# Patient Record
Sex: Female | Born: 1940 | Race: White | Hispanic: No | Smoking: Never smoker
Health system: Southern US, Community
[De-identification: ages and names within clinical notes are randomized; demographics above are authoritative.]

## PROBLEM LIST (undated history)

## (undated) DIAGNOSIS — M8580 Other specified disorders of bone density and structure, unspecified site: Secondary | ICD-10-CM

## (undated) DIAGNOSIS — J479 Bronchiectasis, uncomplicated: Secondary | ICD-10-CM

## (undated) DIAGNOSIS — D649 Anemia, unspecified: Secondary | ICD-10-CM

## (undated) DIAGNOSIS — K5790 Diverticulosis of intestine, part unspecified, without perforation or abscess without bleeding: Secondary | ICD-10-CM

## (undated) DIAGNOSIS — Z86010 Personal history of colonic polyps: Secondary | ICD-10-CM

## (undated) DIAGNOSIS — Z85828 Personal history of other malignant neoplasm of skin: Secondary | ICD-10-CM

## (undated) DIAGNOSIS — F329 Major depressive disorder, single episode, unspecified: Secondary | ICD-10-CM

## (undated) DIAGNOSIS — K5900 Constipation, unspecified: Secondary | ICD-10-CM

## (undated) DIAGNOSIS — E785 Hyperlipidemia, unspecified: Secondary | ICD-10-CM

## (undated) DIAGNOSIS — G2581 Restless legs syndrome: Secondary | ICD-10-CM

## (undated) DIAGNOSIS — T7500XA Unspecified effects of lightning, initial encounter: Secondary | ICD-10-CM

## (undated) DIAGNOSIS — T7840XA Allergy, unspecified, initial encounter: Secondary | ICD-10-CM

## (undated) DIAGNOSIS — G629 Polyneuropathy, unspecified: Secondary | ICD-10-CM

## (undated) DIAGNOSIS — M21619 Bunion of unspecified foot: Secondary | ICD-10-CM

## (undated) DIAGNOSIS — K219 Gastro-esophageal reflux disease without esophagitis: Secondary | ICD-10-CM

## (undated) DIAGNOSIS — K922 Gastrointestinal hemorrhage, unspecified: Secondary | ICD-10-CM

## (undated) DIAGNOSIS — Z8489 Family history of other specified conditions: Secondary | ICD-10-CM

## (undated) DIAGNOSIS — H9190 Unspecified hearing loss, unspecified ear: Secondary | ICD-10-CM

## (undated) DIAGNOSIS — F909 Attention-deficit hyperactivity disorder, unspecified type: Secondary | ICD-10-CM

## (undated) DIAGNOSIS — I10 Essential (primary) hypertension: Secondary | ICD-10-CM

## (undated) DIAGNOSIS — J189 Pneumonia, unspecified organism: Secondary | ICD-10-CM

## (undated) DIAGNOSIS — M1990 Unspecified osteoarthritis, unspecified site: Secondary | ICD-10-CM

## (undated) DIAGNOSIS — F419 Anxiety disorder, unspecified: Secondary | ICD-10-CM

## (undated) DIAGNOSIS — F32A Depression, unspecified: Secondary | ICD-10-CM

## (undated) DIAGNOSIS — C4490 Unspecified malignant neoplasm of skin, unspecified: Secondary | ICD-10-CM

## (undated) HISTORY — DX: Unspecified effects of lightning, initial encounter: T75.00XA

## (undated) HISTORY — DX: Unspecified malignant neoplasm of skin, unspecified: C44.90

## (undated) HISTORY — DX: Depression, unspecified: F32.A

## (undated) HISTORY — DX: Other specified disorders of bone density and structure, unspecified site: M85.80

## (undated) HISTORY — PX: MOHS SURGERY: SUR867

## (undated) HISTORY — DX: Gastro-esophageal reflux disease without esophagitis: K21.9

## (undated) HISTORY — DX: Personal history of other malignant neoplasm of skin: Z85.828

## (undated) HISTORY — PX: FOOT SURGERY: SHX648

## (undated) HISTORY — DX: Diverticulosis of intestine, part unspecified, without perforation or abscess without bleeding: K57.90

## (undated) HISTORY — DX: Attention-deficit hyperactivity disorder, unspecified type: F90.9

## (undated) HISTORY — DX: Constipation, unspecified: K59.00

## (undated) HISTORY — PX: TONSILLECTOMY: SUR1361

## (undated) HISTORY — DX: Allergy, unspecified, initial encounter: T78.40XA

## (undated) HISTORY — DX: Hyperlipidemia, unspecified: E78.5

## (undated) HISTORY — DX: Unspecified osteoarthritis, unspecified site: M19.90

## (undated) HISTORY — DX: Unspecified hearing loss, unspecified ear: H91.90

## (undated) HISTORY — DX: Gastrointestinal hemorrhage, unspecified: K92.2

## (undated) HISTORY — DX: Major depressive disorder, single episode, unspecified: F32.9

## (undated) HISTORY — DX: Anxiety disorder, unspecified: F41.9

## (undated) HISTORY — DX: Anemia, unspecified: D64.9

## (undated) HISTORY — PX: KNEE SURGERY: SHX244

## (undated) HISTORY — DX: Bronchiectasis, uncomplicated: J47.9

## (undated) HISTORY — DX: Pneumonia, unspecified organism: J18.9

## (undated) HISTORY — DX: Bunion of unspecified foot: M21.619

## (undated) HISTORY — DX: Personal history of colonic polyps: Z86.010

## (undated) HISTORY — DX: Essential (primary) hypertension: I10

## (undated) HISTORY — DX: Restless legs syndrome: G25.81

## (undated) HISTORY — PX: OTHER SURGICAL HISTORY: SHX169

## (undated) HISTORY — DX: Polyneuropathy, unspecified: G62.9

---

## 1967-06-07 HISTORY — PX: TONSILLECTOMY: SUR1361

## 1974-06-06 HISTORY — PX: TUBAL LIGATION: SHX77

## 1998-08-27 ENCOUNTER — Other Ambulatory Visit: Admission: RE | Admit: 1998-08-27 | Discharge: 1998-08-27 | Payer: Self-pay | Admitting: Internal Medicine

## 1999-09-06 ENCOUNTER — Other Ambulatory Visit: Admission: RE | Admit: 1999-09-06 | Discharge: 1999-09-06 | Payer: Self-pay | Admitting: Internal Medicine

## 1999-09-23 ENCOUNTER — Ambulatory Visit (HOSPITAL_COMMUNITY): Admission: RE | Admit: 1999-09-23 | Discharge: 1999-09-23 | Payer: Self-pay | Admitting: Internal Medicine

## 1999-09-23 ENCOUNTER — Encounter: Payer: Self-pay | Admitting: Internal Medicine

## 2000-09-28 ENCOUNTER — Other Ambulatory Visit: Admission: RE | Admit: 2000-09-28 | Discharge: 2000-09-28 | Payer: Self-pay | Admitting: Internal Medicine

## 2002-06-06 HISTORY — PX: COLONOSCOPY: SHX174

## 2003-02-07 ENCOUNTER — Other Ambulatory Visit: Admission: RE | Admit: 2003-02-07 | Discharge: 2003-02-07 | Payer: Self-pay | Admitting: Internal Medicine

## 2003-02-07 ENCOUNTER — Encounter: Payer: Self-pay | Admitting: Internal Medicine

## 2003-04-10 LAB — HM COLONOSCOPY

## 2004-04-08 ENCOUNTER — Encounter: Admission: RE | Admit: 2004-04-08 | Discharge: 2004-04-08 | Payer: Self-pay | Admitting: Internal Medicine

## 2004-04-22 ENCOUNTER — Ambulatory Visit: Payer: Self-pay | Admitting: Internal Medicine

## 2004-05-18 ENCOUNTER — Ambulatory Visit: Payer: Self-pay | Admitting: Internal Medicine

## 2004-06-29 ENCOUNTER — Ambulatory Visit: Payer: Self-pay | Admitting: Internal Medicine

## 2004-12-22 ENCOUNTER — Ambulatory Visit: Payer: Self-pay | Admitting: Internal Medicine

## 2005-04-06 DIAGNOSIS — M8580 Other specified disorders of bone density and structure, unspecified site: Secondary | ICD-10-CM

## 2005-04-06 HISTORY — DX: Other specified disorders of bone density and structure, unspecified site: M85.80

## 2005-04-11 ENCOUNTER — Ambulatory Visit: Payer: Self-pay | Admitting: Internal Medicine

## 2005-04-18 ENCOUNTER — Encounter: Payer: Self-pay | Admitting: Internal Medicine

## 2005-04-18 ENCOUNTER — Ambulatory Visit: Payer: Self-pay | Admitting: Internal Medicine

## 2005-04-18 ENCOUNTER — Other Ambulatory Visit: Admission: RE | Admit: 2005-04-18 | Discharge: 2005-04-18 | Payer: Self-pay | Admitting: Internal Medicine

## 2005-04-27 ENCOUNTER — Encounter: Admission: RE | Admit: 2005-04-27 | Discharge: 2005-04-27 | Payer: Self-pay | Admitting: Internal Medicine

## 2005-05-26 ENCOUNTER — Ambulatory Visit: Payer: Self-pay | Admitting: Internal Medicine

## 2005-06-06 HISTORY — PX: OTHER SURGICAL HISTORY: SHX169

## 2005-07-01 ENCOUNTER — Ambulatory Visit: Payer: Self-pay | Admitting: Internal Medicine

## 2005-10-03 ENCOUNTER — Ambulatory Visit: Payer: Self-pay | Admitting: Internal Medicine

## 2005-10-26 ENCOUNTER — Ambulatory Visit: Payer: Self-pay | Admitting: Internal Medicine

## 2005-12-27 ENCOUNTER — Ambulatory Visit: Payer: Self-pay | Admitting: Internal Medicine

## 2006-01-03 ENCOUNTER — Ambulatory Visit: Payer: Self-pay | Admitting: Internal Medicine

## 2006-02-08 ENCOUNTER — Ambulatory Visit: Payer: Self-pay | Admitting: Internal Medicine

## 2006-02-21 ENCOUNTER — Ambulatory Visit: Payer: Self-pay | Admitting: Internal Medicine

## 2006-02-28 ENCOUNTER — Ambulatory Visit: Payer: Self-pay | Admitting: Internal Medicine

## 2006-05-02 ENCOUNTER — Ambulatory Visit (HOSPITAL_BASED_OUTPATIENT_CLINIC_OR_DEPARTMENT_OTHER): Admission: RE | Admit: 2006-05-02 | Discharge: 2006-05-02 | Payer: Self-pay | Admitting: Orthopedic Surgery

## 2006-05-09 ENCOUNTER — Ambulatory Visit: Payer: Self-pay | Admitting: Internal Medicine

## 2006-05-11 ENCOUNTER — Ambulatory Visit: Payer: Self-pay | Admitting: Internal Medicine

## 2006-05-11 LAB — CONVERTED CEMR LAB
BUN: 16 mg/dL (ref 6–23)
Creatinine, Ser: 0.8 mg/dL (ref 0.4–1.2)
Glomerular Filtration Rate, Af Am: 93 mL/min/{1.73_m2}
Potassium: 4.3 meq/L (ref 3.5–5.1)

## 2006-05-12 ENCOUNTER — Ambulatory Visit: Payer: Self-pay | Admitting: Cardiology

## 2006-07-14 ENCOUNTER — Ambulatory Visit: Payer: Self-pay | Admitting: Internal Medicine

## 2006-07-27 ENCOUNTER — Encounter: Admission: RE | Admit: 2006-07-27 | Discharge: 2006-07-27 | Payer: Self-pay | Admitting: Internal Medicine

## 2006-08-14 ENCOUNTER — Ambulatory Visit: Payer: Self-pay | Admitting: Internal Medicine

## 2006-09-28 ENCOUNTER — Ambulatory Visit: Payer: Self-pay | Admitting: Internal Medicine

## 2006-09-28 LAB — CONVERTED CEMR LAB
Cholesterol: 206 mg/dL (ref 0–200)
Direct LDL: 110.6 mg/dL
Ferritin: 5.6 ng/mL — ABNORMAL LOW (ref 10.0–291.0)
Glucose, Bld: 93 mg/dL (ref 70–99)
HDL: 68.1 mg/dL (ref >39.0)
TSH: 1.79 microintl units/mL (ref 0.35–5.50)
VLDL: 17 mg/dL (ref 0–40)

## 2006-10-05 ENCOUNTER — Ambulatory Visit: Payer: Self-pay | Admitting: Internal Medicine

## 2006-10-24 ENCOUNTER — Ambulatory Visit: Payer: Self-pay | Admitting: Internal Medicine

## 2006-10-24 ENCOUNTER — Encounter: Payer: Self-pay | Admitting: Internal Medicine

## 2006-10-24 DIAGNOSIS — G4762 Sleep related leg cramps: Secondary | ICD-10-CM

## 2006-10-24 DIAGNOSIS — T691XXA Chilblains, initial encounter: Secondary | ICD-10-CM

## 2006-10-24 DIAGNOSIS — J479 Bronchiectasis, uncomplicated: Secondary | ICD-10-CM

## 2006-10-24 DIAGNOSIS — F329 Major depressive disorder, single episode, unspecified: Secondary | ICD-10-CM

## 2006-10-24 DIAGNOSIS — F909 Attention-deficit hyperactivity disorder, unspecified type: Secondary | ICD-10-CM | POA: Insufficient documentation

## 2006-10-24 DIAGNOSIS — F3289 Other specified depressive episodes: Secondary | ICD-10-CM | POA: Insufficient documentation

## 2006-10-24 DIAGNOSIS — C4490 Unspecified malignant neoplasm of skin, unspecified: Secondary | ICD-10-CM

## 2006-10-24 DIAGNOSIS — K219 Gastro-esophageal reflux disease without esophagitis: Secondary | ICD-10-CM

## 2006-10-24 DIAGNOSIS — G609 Hereditary and idiopathic neuropathy, unspecified: Secondary | ICD-10-CM

## 2006-10-24 DIAGNOSIS — Z872 Personal history of diseases of the skin and subcutaneous tissue: Secondary | ICD-10-CM | POA: Insufficient documentation

## 2006-10-24 DIAGNOSIS — K5730 Diverticulosis of large intestine without perforation or abscess without bleeding: Secondary | ICD-10-CM | POA: Insufficient documentation

## 2006-10-24 DIAGNOSIS — E785 Hyperlipidemia, unspecified: Secondary | ICD-10-CM | POA: Insufficient documentation

## 2006-10-24 HISTORY — DX: Bronchiectasis, uncomplicated: J47.9

## 2006-10-24 HISTORY — DX: Unspecified malignant neoplasm of skin, unspecified: C44.90

## 2006-11-27 ENCOUNTER — Ambulatory Visit: Payer: Self-pay | Admitting: Internal Medicine

## 2006-11-27 LAB — CONVERTED CEMR LAB
ALT: 26 units/L (ref 0–40)
Ferritin: 29 ng/mL (ref 10.0–291.0)
Total CK: 128 units/L (ref 7–177)
VLDL: 17 mg/dL (ref 0–40)

## 2006-12-04 ENCOUNTER — Ambulatory Visit: Payer: Self-pay | Admitting: Internal Medicine

## 2006-12-17 DIAGNOSIS — M949 Disorder of cartilage, unspecified: Secondary | ICD-10-CM

## 2006-12-17 DIAGNOSIS — M899 Disorder of bone, unspecified: Secondary | ICD-10-CM | POA: Insufficient documentation

## 2007-01-08 ENCOUNTER — Telehealth: Payer: Self-pay | Admitting: Internal Medicine

## 2007-02-05 ENCOUNTER — Telehealth: Payer: Self-pay | Admitting: Internal Medicine

## 2007-02-20 ENCOUNTER — Telehealth: Payer: Self-pay | Admitting: Internal Medicine

## 2007-03-14 ENCOUNTER — Ambulatory Visit: Payer: Self-pay | Admitting: Internal Medicine

## 2007-03-14 LAB — CONVERTED CEMR LAB
Basophils Relative: 0.6 % (ref 0.0–1.0)
Monocytes Relative: 7.2 % (ref 3.0–11.0)
Neutro Abs: 5.1 10*3/uL (ref 1.4–7.7)
Platelets: 251 10*3/uL (ref 150–400)
RDW: 13.3 % (ref 11.5–14.6)
Rhuematoid fact SerPl-aCnc: <20 intl units/mL — ABNORMAL LOW (ref 0.0–20.0)

## 2007-03-19 LAB — CONVERTED CEMR LAB
Tissue Transglutaminase Ab, IgA: 0.4 units (ref <7)
Vit D, 1,25-Dihydroxy: 34 (ref 30–89)

## 2007-03-27 ENCOUNTER — Ambulatory Visit: Payer: Self-pay | Admitting: Internal Medicine

## 2007-03-27 DIAGNOSIS — D509 Iron deficiency anemia, unspecified: Secondary | ICD-10-CM | POA: Insufficient documentation

## 2007-03-27 DIAGNOSIS — M546 Pain in thoracic spine: Secondary | ICD-10-CM

## 2007-03-27 DIAGNOSIS — D485 Neoplasm of uncertain behavior of skin: Secondary | ICD-10-CM

## 2007-04-19 ENCOUNTER — Telehealth: Payer: Self-pay | Admitting: Internal Medicine

## 2007-04-24 ENCOUNTER — Telehealth: Payer: Self-pay | Admitting: Internal Medicine

## 2007-06-07 LAB — HM DIABETES EYE EXAM

## 2007-06-13 ENCOUNTER — Ambulatory Visit: Payer: Self-pay | Admitting: Internal Medicine

## 2007-06-13 LAB — CONVERTED CEMR LAB
Eosinophils Absolute: 0.2 10*3/uL (ref 0.0–0.6)
Eosinophils Relative: 2.3 % (ref 0.0–5.0)
Ferritin: 42.5 ng/mL (ref 10.0–291.0)
MCV: 95.1 fL (ref 78.0–100.0)
Monocytes Relative: 8.1 % (ref 3.0–11.0)
Platelets: 234 10*3/uL (ref 150–400)
RBC: 4.28 M/uL (ref 3.87–5.11)
WBC: 6.6 10*3/uL (ref 4.5–10.5)

## 2007-06-27 ENCOUNTER — Ambulatory Visit: Payer: Self-pay | Admitting: Internal Medicine

## 2007-06-27 ENCOUNTER — Other Ambulatory Visit: Admission: RE | Admit: 2007-06-27 | Discharge: 2007-06-27 | Payer: Self-pay | Admitting: Internal Medicine

## 2007-06-27 ENCOUNTER — Encounter: Payer: Self-pay | Admitting: Internal Medicine

## 2007-06-27 DIAGNOSIS — M545 Low back pain: Secondary | ICD-10-CM

## 2007-06-27 LAB — CONVERTED CEMR LAB: Pap Smear: NORMAL

## 2007-07-02 ENCOUNTER — Encounter: Payer: Self-pay | Admitting: Internal Medicine

## 2007-08-30 ENCOUNTER — Encounter: Admission: RE | Admit: 2007-08-30 | Discharge: 2007-08-30 | Payer: Self-pay | Admitting: Internal Medicine

## 2007-09-21 ENCOUNTER — Telehealth: Payer: Self-pay | Admitting: *Deleted

## 2007-10-04 ENCOUNTER — Telehealth (INDEPENDENT_AMBULATORY_CARE_PROVIDER_SITE_OTHER): Payer: Self-pay | Admitting: *Deleted

## 2007-12-17 ENCOUNTER — Ambulatory Visit: Payer: Self-pay | Admitting: Internal Medicine

## 2007-12-17 DIAGNOSIS — IMO0001 Reserved for inherently not codable concepts without codable children: Secondary | ICD-10-CM

## 2008-02-18 ENCOUNTER — Ambulatory Visit: Payer: Self-pay | Admitting: Internal Medicine

## 2008-03-25 ENCOUNTER — Ambulatory Visit: Payer: Self-pay | Admitting: Internal Medicine

## 2008-04-22 ENCOUNTER — Ambulatory Visit: Payer: Self-pay | Admitting: Internal Medicine

## 2008-04-22 DIAGNOSIS — M79609 Pain in unspecified limb: Secondary | ICD-10-CM | POA: Insufficient documentation

## 2008-05-08 ENCOUNTER — Telehealth: Payer: Self-pay | Admitting: Internal Medicine

## 2008-05-13 ENCOUNTER — Ambulatory Visit: Payer: Self-pay | Admitting: Internal Medicine

## 2008-05-16 ENCOUNTER — Encounter: Admission: RE | Admit: 2008-05-16 | Discharge: 2008-05-16 | Payer: Self-pay | Admitting: Orthopedic Surgery

## 2008-06-03 ENCOUNTER — Ambulatory Visit: Payer: Self-pay | Admitting: Internal Medicine

## 2008-06-03 LAB — CONVERTED CEMR LAB
Basophils Absolute: 0 10*3/uL (ref 0.0–0.1)
Calcium: 9.1 mg/dL (ref 8.4–10.5)
Cholesterol: 303 mg/dL (ref 0–200)
Eosinophils Absolute: 0.2 10*3/uL (ref 0.0–0.7)
GFR calc Af Amer: 92 mL/min
GFR calc non Af Amer: 76 mL/min
HCT: 41.2 % (ref 36.0–46.0)
MCHC: 35.4 g/dL (ref 30.0–36.0)
MCV: 93.7 fL (ref 78.0–100.0)
Monocytes Absolute: 0.4 10*3/uL (ref 0.1–1.0)
Monocytes Relative: 7.4 % (ref 3.0–12.0)
Neutro Abs: 2.2 10*3/uL (ref 1.4–7.7)
Platelets: 219 10*3/uL (ref 150–400)
Potassium: 4.7 meq/L (ref 3.5–5.1)
RDW: 12.5 % (ref 11.5–14.6)
Sodium: 142 meq/L (ref 135–145)
TSH: 2.56 microintl units/mL (ref 0.35–5.50)
Total CHOL/HDL Ratio: 4.4
Triglycerides: 124 mg/dL (ref 0–149)

## 2008-06-10 ENCOUNTER — Ambulatory Visit: Payer: Self-pay | Admitting: Internal Medicine

## 2008-06-16 ENCOUNTER — Encounter: Payer: Self-pay | Admitting: Internal Medicine

## 2008-09-08 ENCOUNTER — Telehealth: Payer: Self-pay | Admitting: Internal Medicine

## 2008-09-30 ENCOUNTER — Ambulatory Visit: Payer: Self-pay | Admitting: Internal Medicine

## 2008-09-30 LAB — CONVERTED CEMR LAB: Cholesterol: 286 mg/dL — ABNORMAL HIGH (ref 0–200)

## 2008-10-07 ENCOUNTER — Telehealth: Payer: Self-pay | Admitting: *Deleted

## 2008-10-07 ENCOUNTER — Ambulatory Visit: Payer: Self-pay | Admitting: Internal Medicine

## 2008-11-03 DIAGNOSIS — Z85828 Personal history of other malignant neoplasm of skin: Secondary | ICD-10-CM | POA: Insufficient documentation

## 2008-11-10 ENCOUNTER — Telehealth: Payer: Self-pay | Admitting: *Deleted

## 2008-11-11 ENCOUNTER — Telehealth: Payer: Self-pay | Admitting: Internal Medicine

## 2008-12-04 ENCOUNTER — Telehealth: Payer: Self-pay | Admitting: *Deleted

## 2009-01-28 ENCOUNTER — Telehealth: Payer: Self-pay | Admitting: Internal Medicine

## 2009-02-10 ENCOUNTER — Telehealth: Payer: Self-pay | Admitting: *Deleted

## 2009-02-16 ENCOUNTER — Ambulatory Visit: Payer: Self-pay | Admitting: Internal Medicine

## 2009-02-16 LAB — CONVERTED CEMR LAB
AST: 22 units/L (ref 0–37)
Albumin: 4 g/dL (ref 3.5–5.2)
Alkaline Phosphatase: 59 units/L (ref 39–117)
Cholesterol: 190 mg/dL (ref 0–200)
HDL: 73.2 mg/dL (ref >39.00)
Iron: 114 ug/dL (ref 42–145)
LDL Cholesterol: 101 mg/dL — ABNORMAL HIGH (ref 0–99)
Saturation Ratios: 35.8 % (ref 20.0–50.0)
Total CHOL/HDL Ratio: 3
Total Protein: 6.6 g/dL (ref 6.0–8.3)
Triglycerides: 79 mg/dL (ref 0.0–149.0)

## 2009-02-19 ENCOUNTER — Telehealth: Payer: Self-pay | Admitting: *Deleted

## 2009-02-20 ENCOUNTER — Ambulatory Visit: Payer: Self-pay | Admitting: Internal Medicine

## 2009-03-09 ENCOUNTER — Telehealth: Payer: Self-pay | Admitting: *Deleted

## 2009-03-10 ENCOUNTER — Encounter: Payer: Self-pay | Admitting: Internal Medicine

## 2009-03-11 ENCOUNTER — Encounter: Payer: Self-pay | Admitting: Internal Medicine

## 2009-03-12 ENCOUNTER — Ambulatory Visit: Payer: Self-pay | Admitting: Internal Medicine

## 2009-03-12 ENCOUNTER — Telehealth (INDEPENDENT_AMBULATORY_CARE_PROVIDER_SITE_OTHER): Payer: Self-pay | Admitting: *Deleted

## 2009-04-03 ENCOUNTER — Encounter (INDEPENDENT_AMBULATORY_CARE_PROVIDER_SITE_OTHER): Payer: Self-pay | Admitting: *Deleted

## 2009-04-09 ENCOUNTER — Encounter: Payer: Self-pay | Admitting: Internal Medicine

## 2009-05-18 ENCOUNTER — Telehealth: Payer: Self-pay | Admitting: *Deleted

## 2009-06-10 ENCOUNTER — Ambulatory Visit: Payer: Self-pay | Admitting: Internal Medicine

## 2009-06-10 LAB — CONVERTED CEMR LAB
Ferritin: 56.9 ng/mL (ref 10.0–291.0)
Iron: 62 ug/dL (ref 42–145)

## 2009-06-19 ENCOUNTER — Ambulatory Visit: Payer: Self-pay | Admitting: Internal Medicine

## 2009-06-19 DIAGNOSIS — M959 Acquired deformity of musculoskeletal system, unspecified: Secondary | ICD-10-CM

## 2009-06-23 ENCOUNTER — Telehealth: Payer: Self-pay | Admitting: *Deleted

## 2009-06-26 ENCOUNTER — Ambulatory Visit: Payer: Self-pay | Admitting: Internal Medicine

## 2009-07-10 ENCOUNTER — Telehealth: Payer: Self-pay | Admitting: *Deleted

## 2009-07-14 ENCOUNTER — Telehealth: Payer: Self-pay | Admitting: *Deleted

## 2009-07-22 ENCOUNTER — Encounter: Payer: Self-pay | Admitting: Internal Medicine

## 2010-01-21 ENCOUNTER — Ambulatory Visit: Payer: Self-pay | Admitting: Internal Medicine

## 2010-03-01 ENCOUNTER — Telehealth: Payer: Self-pay | Admitting: *Deleted

## 2010-03-05 ENCOUNTER — Ambulatory Visit: Payer: Self-pay | Admitting: Internal Medicine

## 2010-03-05 ENCOUNTER — Other Ambulatory Visit: Admission: RE | Admit: 2010-03-05 | Discharge: 2010-03-05 | Payer: Self-pay | Admitting: Internal Medicine

## 2010-03-05 ENCOUNTER — Encounter: Payer: Self-pay | Admitting: Internal Medicine

## 2010-03-05 DIAGNOSIS — H9190 Unspecified hearing loss, unspecified ear: Secondary | ICD-10-CM | POA: Insufficient documentation

## 2010-03-05 LAB — CONVERTED CEMR LAB: Vit D, 25-Hydroxy: 49 ng/mL (ref 30–89)

## 2010-03-08 ENCOUNTER — Telehealth: Payer: Self-pay | Admitting: *Deleted

## 2010-03-08 ENCOUNTER — Ambulatory Visit: Payer: Self-pay | Admitting: Internal Medicine

## 2010-03-08 LAB — CONVERTED CEMR LAB
ALT: 21 units/L (ref 0–35)
Albumin: 4.5 g/dL (ref 3.5–5.2)
Basophils Relative: 0.7 % (ref 0.0–3.0)
CO2: 29 meq/L (ref 19–32)
Chloride: 104 meq/L (ref 96–112)
Cholesterol: 234 mg/dL — ABNORMAL HIGH (ref 0–200)
Creatinine, Ser: 0.8 mg/dL (ref 0.4–1.2)
Direct LDL: 139.5 mg/dL
Eosinophils Absolute: 0.2 10*3/uL (ref 0.0–0.7)
Eosinophils Relative: 2.9 % (ref 0.0–5.0)
Ferritin: 204.4 ng/mL (ref 10.0–291.0)
HCT: 44.8 % (ref 36.0–46.0)
Lymphs Abs: 2.3 10*3/uL (ref 0.7–4.0)
MCHC: 34.1 g/dL (ref 30.0–36.0)
MCV: 97.3 fL (ref 78.0–100.0)
Monocytes Absolute: 0.5 10*3/uL (ref 0.1–1.0)
Neutro Abs: 2.9 10*3/uL (ref 1.4–7.7)
Neutrophils Relative %: 48.8 % (ref 43.0–77.0)
Potassium: 5.9 meq/L — ABNORMAL HIGH (ref 3.5–5.1)
Potassium: 4.5 meq/L (ref 3.5–5.1)
RBC: 4.6 M/uL (ref 3.87–5.11)
Saturation Ratios: 35.2 % (ref 20.0–50.0)
TSH: 0.74 microintl units/mL (ref 0.35–5.50)
Total CHOL/HDL Ratio: 3
Total CK: 87 units/L (ref 7–177)
Total Protein: 7 g/dL (ref 6.0–8.3)
Triglycerides: 117 mg/dL (ref 0.0–149.0)
WBC: 5.9 10*3/uL (ref 4.5–10.5)

## 2010-03-30 ENCOUNTER — Telehealth: Payer: Self-pay | Admitting: Internal Medicine

## 2010-06-08 ENCOUNTER — Telehealth: Payer: Self-pay | Admitting: *Deleted

## 2010-06-27 ENCOUNTER — Encounter: Payer: Self-pay | Admitting: Internal Medicine

## 2010-06-29 ENCOUNTER — Telehealth: Payer: Self-pay | Admitting: *Deleted

## 2010-07-06 NOTE — Medication Information (Signed)
Summary: Coverage Approval for Cymbalta  Coverage Approval for Cymbalta   Imported By: Maryln Gottron 07/28/2009 13:20:06  _____________________________________________________________________  External Attachment:    Type:   Image     Comment:   External Document

## 2010-07-06 NOTE — Progress Notes (Signed)
Summary: chole results  Phone Note Call from Patient Call back at [phone]   Caller: Patient Call For: Madelin Headings MD Summary of Call: pt is call requesting chole all lab results. Pt is aware of potassium results Initial call taken by: Heron Sabins,  March 08, 2010 4:33 PM  Follow-up for Phone Call        Pt aware of results. Follow-up by: Romualdo Bolk, CMA (AAMA),  March 08, 2010 4:46 PM

## 2010-07-06 NOTE — Progress Notes (Signed)
Summary: refill on clonazepam 0.5mg   Phone Note From Pharmacy   Caller: [facility] Reason for Call: Needs renewal Details for Reason: clonazepam 0.5mg  Summary of Call: last filled on 10/22/09 #180 Initial call taken by: Romualdo Bolk, CMA (AAMA),  March 01, 2010 4:25 PM  Follow-up for Phone Call        ok x 1  Follow-up by: Madelin Headings MD,  March 02, 2010 1:14 PM  Additional Follow-up for Phone Call Additional follow up Details #1::        Rx faxed to pharmacy Additional Follow-up by: Romualdo Bolk, CMA Duncan Dull),  March 02, 2010 2:32 PM    Prescriptions: KLONOPIN 0.5 MG TABS (CLONAZEPAM) Take 1 to 2tablets  by mouth every night as directed  #180 x 0   Entered by:   Romualdo Bolk, CMA (AAMA)   Authorized by:   Madelin Headings MD   Signed by:   Romualdo Bolk, CMA (AAMA) on 03/02/2010   Method used:   Handwritten   RxID:   [id number]

## 2010-07-06 NOTE — Assessment & Plan Note (Signed)
Summary: 4 MONTH ROV/NJR   Vital Signs:  Patient profile:   70 year old female Menstrual status:  postmenopausal Weight:      126 pounds Pulse rate:   66 / minute BP sitting:   110 / 80  (right arm) Cuff size:   regular  Vitals Entered By: Romualdo Bolk, CMA (AAMA) (June 19, 2009 11:12 AM) CC: Follow-up visit on labs   History of Present Illness: Julie Bonilla comesin today for   fu of iron studies and neuropathy.  She is actually doing much better.  #Neuropathy numbness right leg  is Much  better  and  had beeb  getting worse .   has seen  Dr Vickey Huger.   dx sensory  polyneuropathy  feet legs . NOt radicular.     .    ? if cymbalta  help. patient thinks something is working.   Problem is   Much more tolerable now.      Taking cymbalta.  60 two times a day  usually and neurontin 2 three times a day  #Derm:   [facility]  :     skin cancer  back removed.   BCCA.    and face  returned.       had  surgery  Mohs  [facility].  right forehead and eyebrow area. #NOted prominance right anterior clavicle area. no injury and minimal discomort.  recent change in the last year. Has scoliosis stable.      RLS:  taking iron two times a day usually with meals  no se.  LIPDS: still taking crestor cause doesnt think causing the neuropathy problems . although Neur said possible contributor .  Preventive Screening-Counseling & Management  Alcohol-Tobacco     Alcohol drinks/day: 1     Alcohol type: wine     Smoking Status: never  Caffeine-Diet-Exercise     Caffeine use/day: 2     Does Patient Exercise: yes     Type of exercise: walking     Times/week: 7  Current Medications (verified): 1)  Aspir-Mox 16-109-60 Mg Tabs (Aspirin Buff (Al Hyd-Mg Hyd)) .... Take 1 Tablet By Mouth Once A Day 2)  Iron 18 Mg Tbcr (Ferrous Fumarate) .... Take 3)  Klonopin 0.5 Mg Tabs (Clonazepam) .... Take 1 To 2tablets  By Mouth Every Night As Directed 4)  Mobic 7.5 Mg Tabs (Meloxicam) .Marland Kitchen.. 1 By Mouth Once Daily 5)   Multiple Vitamins  Tabs (Multiple Vitamin) .... Take 1 Tablet By Mouth Once A Day 6)  Neurontin 300 Mg Caps (Gabapentin) .... Take 1 Capsule By Mouth Three Times A Dayand Increase To 2 By Mouth Three Times A Day As Directed 7)  Sm Calcium/vitamin D 500-200 Mg-Unit Tabs (Calcium Carbonate-Vitamin D) .... Take 2 Tablet By Mouth Once A Day 8)  Vitamin C 500 Mg Tabs (Ascorbic Acid) .... Take 9)  Fish Oil   Oil (Fish Oil) .... 3  By Mouth Once Daily 10)  Cymbalta 60 Mg Cpep (Duloxetine Hcl) .... 2 By Mouth Once Daily 11)  Crestor 10 Mg Tabs (Rosuvastatin Calcium) .Marland Kitchen.. 1 By Mouth Once Daily  Allergies (verified): 1)  ! Erythromycin  Past History:  Past medical, surgical, family and social histories (including risk factors) reviewed, and no changes noted (except as noted below).  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  Past History:  Care Management: Dermatology: Dr. Dorinda Hill Neurology:  Dohmeir consult 2010 Pulmonary : Alwyn Ren- in the past  Family History: Reviewed history from 06/10/2008 and no changes required. Family History of CAD Female 1st degree relative <50 Family History Diabetes 1st degree relative Family History Hypertension  Son died April 18, 2023 of overwhelming infection ? kidney         Father: Stroke at age 83 Mother: Stroke and heart problems Siblings: hypertension, twin-fibromyalga, sister heart problems and arthrisis, pinched nerve, younger sister arthritis that effects the skin  Social History: Reviewed history from 10/07/2008 and no changes required. Retired Married Never Smoked Alcohol use-yes  social See data base            Review of Systems  The patient denies anorexia, fever, weight loss, weight gain, vision loss, decreased hearing, chest pain, prolonged cough, abdominal pain, melena, muscle weakness, transient blindness, difficulty walking, depression, unusual weight change, abnormal bleeding, enlarged lymph nodes, and angioedema.    Physical Exam  General:  Well-developed,well-nourished,in no acute distress; alert,appropriate and cooperative throughout examination with steri strips over forehead and looks well  Head:  normocephalic and atraumatic.   Eyes:  vision grossly intact, pupils equal, and pupils round.   Neck:  No deformities, masses, or tenderness noted. Chest Wall:  prominant right ant clavicle at clavicular sternal joint.  no redness  minimal scoliosis of throrax? Lungs:  Normal respiratory effort, chest expands symmetrically. Lungs are clear to auscultation, no crackles or wheezes.no dullness.   Heart:  Normal rate and regular rhythm. S1 and S2 normal without gallop, murmur, click, rub or other extra sounds. Skin:  turgor normal, color normal, no ecchymoses, no petechiae, and no purpura.  sun changes   bandages face and back  Cervical Nodes:  No lymphadenopathy noted Psych:  Oriented X3, good eye contact, not anxious appearing, and not depressed appearing.     Impression & Recommendations:  Problem # 1:  PERIPHERAL NEUROPATHY (ICD-356.9)  sensory   causing leg pain. 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 Her updated medication list for this problem includes:    Iron 18 Mg Tbcr (Ferrous fumarate) .Marland Kitchen... Take  Hgb: 14.6 (06/03/2008)   Hct: 41.2 (06/03/2008)   Platelets: 219 (06/03/2008) RBC: 4.40  (06/03/2008)   RDW: 12.5 (06/03/2008)   WBC: 5.0 (06/03/2008) MCV: 93.7 (06/03/2008)   MCHC: 35.4 (06/03/2008) Ferritin: 56.9 (06/10/2009) Iron: 62 (06/10/2009)   % Sat: 20.2 (06/10/2009) B12: 322 (09/28/2006)   TSH: 2.56 (06/03/2008)  Problem # 4:  HYPERLIPIDEMIA (ICD-272.4)  Her updated medication list for this problem includes:    Crestor 10 Mg Tabs (Rosuvastatin calcium) .Marland Kitchen... 1 by mouth once daily  Labs Reviewed: SGOT: 22 (02/16/2009)   SGPT: 23 (02/16/2009)   HDL:73.20 (02/16/2009), 58.70 (09/30/2008)  LDL:101 (02/16/2009), DEL (06/03/2008)  Chol:190 (02/16/2009), 286 (09/30/2008)  Trig:79.0 (02/16/2009), 123.0 (09/30/2008)  Problem # 5:  DEPRESSION (ICD-311) Assessment: Improved  Her updated medication list for this problem includes:    Klonopin 0.5 Mg Tabs (Clonazepam) .Marland KitchenMarland KitchenMarland KitchenMarland Kitchen  Take 1 to 2tablets  by mouth every night as directed    Cymbalta 60 Mg Cpep (Duloxetine hcl) .Marland Kitchen... 2 by mouth once daily  Problem # 6:  ACQUIRED MUSCULOSKELETAL DEFORMITY OTH SPEC SITE (ICD-738.8) ? prominent sternoclavicular joint on right   assymm and changed  poss osteophyte or joint related . get x ray at her conveneince and if ok then check as needed.  Orders: T-Clavicle Right (73000TC)  Complete Medication List: 1)  Aspir-mox 75-325-75 Mg Tabs (Aspirin buff (al hyd-mg hyd)) .... Take 1 tablet by mouth once a day 2)  Iron 18 Mg Tbcr (Ferrous fumarate) .... Take 3)  Klonopin 0.5 Mg Tabs (Clonazepam) .... Take 1 to 2tablets  by mouth every night as directed 4)  Mobic 7.5 Mg Tabs (Meloxicam) .Marland Kitchen.. 1 by mouth once daily 5)  Multiple Vitamins Tabs (Multiple vitamin) .... Take 1 tablet by mouth once a day 6)  Neurontin 300 Mg Caps (Gabapentin) .... Take  2 by mouth three times a day as directed 7)  Sm Calcium/vitamin D 500-200 Mg-unit Tabs (Calcium carbonate-vitamin d) .... Take 2 tablet by mouth once a day 8)  Vitamin C 500 Mg Tabs (Ascorbic acid) .... Take 9)  Fish Oil Oil (Fish oil) .... 3  by mouth  once daily 10)  Cymbalta 60 Mg Cpep (Duloxetine hcl) .... 2 by mouth once daily 11)  Crestor 10 Mg Tabs (Rosuvastatin calcium) .Marland Kitchen.. 1 by mouth once daily  Patient Instructions: 1)  can  x ray the right clavicle joint .   2)  try to take the iron three times a day if possible. 3)  No change medication. 4)  return office visit in yearly check in september .   5)  call for lab orders or we can do this the day of the visit.  Prescriptions: MOBIC 7.5 MG TABS (MELOXICAM) 1 by mouth once daily  #90 x 2   Entered and Authorized by:   Madelin Headings MD   Signed by:   Madelin Headings MD on 06/19/2009   Method used:   Electronically to        [id number] (retail)       [address] [place], [place]  [address]       Ph: [phone]       Fax: [phone]   RxID:   [id number] KLONOPIN 0.5 MG TABS (CLONAZEPAM) Take 1 to 2tablets  by mouth every night as directed  #180 x 1   Entered and Authorized by:   Madelin Headings MD   Signed by:   Madelin Headings MD on 06/19/2009   Method used:   Print then Give to Patient   RxID:   [id number] NEURONTIN 300 MG CAPS (GABAPENTIN) Take 1 capsule by mouth three times a dayand increase to 2 by mouth three times a day as directed  #180 Capsule x 12   Entered and Authorized by:   Madelin Headings MD   Signed by:   Madelin Headings MD on 06/19/2009   Method used:   Electronically to        [id number] (retail)       [address] [place], [place]  [address]       Ph: [phone]  Fax: [phone]   RxID:   [id number]
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
## 2010-07-06 NOTE — Progress Notes (Signed)
Summary: ins not covering cymbalta due to qty  Phone Note Call from Patient Call back at [phone]   Caller: Patient Summary of Call: Pt called saying that her cymbalta wouldn't be coved by Ins due to qty. I left pt a message to call us back about why she is taking 2 a day and to have her pharmacy to call us with the number to call and get the prior auth. Initial call taken by: Romualdo Bolk, CMA (AAMA),  July 10, 2009 5:18 PM  Follow-up for Phone Call        Pt to have the pharmacy to call us about doing a prior auth. Follow-up by: Romualdo Bolk, CMA (AAMA),  July 14, 2009 10:07 AM

## 2010-07-06 NOTE — Miscellaneous (Signed)
Summary: Waiver of Liability for Zostavax  Waiver of Liability for Zostavax   Imported By: Maryln Gottron 03/10/2010 12:40:19  _____________________________________________________________________  External Attachment:    Type:   Image     Comment:   External Document

## 2010-07-06 NOTE — Letter (Signed)
Summary: Request for Surgical Clearance/[facility]  Request for Surgical Clearance/[facility]   Imported By: Maryln Gottron 02/04/2010 13:16:29  _____________________________________________________________________  External Attachment:    Type:   Image     Comment:   External Document

## 2010-07-06 NOTE — Progress Notes (Signed)
Summary: new rx  Phone Note Call from Patient Call back at Home Phone [phone]   Caller: Patient-live call Summary of Call: Needs new rx Crestor at [facility] at [place]. Initial call taken by: Warnell Forester,  June 23, 2009 11:34 AM  Follow-up for Phone Call        Rx sent to pharmacy. Follow-up by: Romualdo Bolk, CMA (AAMA),  June 23, 2009 4:46 PM    Prescriptions: CRESTOR 10 MG TABS (ROSUVASTATIN CALCIUM) 1 by mouth once daily  #30 Tablet x 9   Entered by:   Romualdo Bolk, CMA (AAMA)   Authorized by:   Madelin Headings MD   Signed by:   Romualdo Bolk, CMA (AAMA) on 06/23/2009   Method used:   Electronically to        [facility]. [phone]* (retail)       [phone] [address]       [place], [place]  [address]       Ph: [phone]       Fax: [phone]   RxID:   [id number]

## 2010-07-06 NOTE — Progress Notes (Signed)
Summary: prior auth on cymbalta  Phone Note From Pharmacy   Caller: [facility]. #[id number]* Summary of Call: Prior Auth [phone] for cymbalta. Initial call taken by: Romualdo Bolk, CMA Duncan Dull),  July 14, 2009 10:58 AM  Follow-up for Phone Call        Notified pharmacy to send fax initiating authorization.  We cannot take phone numbers without the complete information, and appropriate initial form. Follow-up by: Lynann Beaver CMA,  July 14, 2009 11:02 AM

## 2010-07-08 NOTE — Progress Notes (Signed)
Summary: refill on clonazepam  Phone Note From Pharmacy   Caller: [facility] Reason for Call: Needs renewal Details for Reason: clonazepam 0.5mg  Initial call taken by: Romualdo Bolk, CMA Duncan Dull),  June 29, 2010 10:13 AM  Follow-up for Phone Call        ok x 1   she is due for return office visit end of March or April. Follow-up by: Madelin Headings MD,  June 29, 2010 6:32 PM  Additional Follow-up for Phone Call Additional follow up Details #1::        Faxed to pharmacy. Pt aware of this. Additional Follow-up by: Romualdo Bolk, CMA (AAMA),  June 30, 2010 9:08 AM    Prescriptions: KLONOPIN 0.5 MG TABS (CLONAZEPAM) Take 1 to 2tablets  by mouth every night as directed  #180 x 0   Entered by:   Romualdo Bolk, CMA (AAMA)   Authorized by:   Madelin Headings MD   Signed by:   Romualdo Bolk, CMA (AAMA) on 06/30/2010   Method used:   Handwritten   RxID:   [id number]

## 2010-07-08 NOTE — Progress Notes (Signed)
Summary: refills on mobic  Phone Note From Pharmacy   Caller: [facility] Reason for Call: Needs renewal Details for Reason: mobic 7.5mg  Summary of Call: last filled on 09/21/09 #90 Initial call taken by: Romualdo Bolk, CMA (AAMA),  June 08, 2010 2:58 PM  Follow-up for Phone Call        ok x 1  Follow-up by: Madelin Headings MD,  June 08, 2010 5:51 PM  Additional Follow-up for Phone Call Additional follow up Details #1::        Rx sent to pharmacy Additional Follow-up by: Romualdo Bolk, CMA (AAMA),  June 09, 2010 9:24 AM    Prescriptions: MOBIC 7.5 MG TABS (MELOXICAM) 1 by mouth once daily  #90 x 1   Entered by:   Romualdo Bolk, CMA (AAMA)   Authorized by:   Madelin Headings MD   Signed by:   Romualdo Bolk, CMA (AAMA) on 06/09/2010   Method used:   Electronically to        [facility]. [phone]* (retail)       [phone] [address].       [place], [place]  [address]       Ph: [phone]       Fax: [phone]   RxID:   [id number]

## 2010-07-08 NOTE — Progress Notes (Signed)
Summary: Pt recieved a call from [facility] about not covering td   Phone Note Call from Patient Call back at [facility] Phone [phone] Call back at [phone]   Caller: Patient Summary of Call: Pt got a statement from [facility]. She was told that they don't pay for a TD unless she is at risk. She is saying that we need to fax something to them about her being at risk for this. She states that we need to fax it to [place]. They also told her that they don't pay for a shingles shot at the MD's office, they are going to send her a paper to fill out about the shingles vaccine. They only pay if she goes to the pharmacy to get it. Initial call taken by: Romualdo Bolk, CMA Duncan Dull),  March 30, 2010 4:16 PM  Follow-up for Phone Call        Pt called back saying that the number to call is [phone]. Follow-up by: Romualdo Bolk, CMA Duncan Dull),  March 31, 2010 11:57 AM  Additional Follow-up for Phone Call Additional follow up Details #1::        Pt called back and said that she rcvd another lttr from [facility]), stating that they need a break down of the administration and cost of the vaccine. Pt would like to pick this up asap. Pls call pt at [phone] Additional Follow-up by: Lucy Antigua,  May 04, 2010 2:34 PM    Additional Follow-up for Phone Call Additional follow up Details #2::    Patient received requested information. Follow-up by: Roney Jaffe,  June 11, 2010 4:28 PM

## 2010-08-11 ENCOUNTER — Telehealth: Payer: Self-pay | Admitting: Internal Medicine

## 2010-08-11 DIAGNOSIS — E785 Hyperlipidemia, unspecified: Secondary | ICD-10-CM

## 2010-08-11 NOTE — Telephone Encounter (Signed)
Last lipid done on 03/05/10 Pt is on crestor 10mg  She just had elevated potassium then but then repeat was normal.   Tests: (5) Lipid Panel (LIPID)   Cholesterol          [H]  234 mg/dL                   1-610     ATP III Classification            Desirable:  < 200 mg/dL                    Borderline High:  200 - 239 mg/dL               High:  > = 240 mg/dL   Triglycerides             117.0 mg/dL                 9.6-045.4     Normal:  <150 mg/dL     Borderline High:  098 - 199 mg/dL   HDL                       11.91 mg/dL                 >47.82   VLDL Cholesterol          23.4 mg/dL                  9.5-62.1  CHO/HDL Ratio:  CHD Risk                             3                    Men          Women     1/2 Average Risk     3.4          3.3     Average Risk          5.0          4.4     2X Average Risk          9.6          7.1     3X Average Risk          15.0          11.0

## 2010-08-11 NOTE — Telephone Encounter (Signed)
Triage vm------has appt this month. Wants order to have chloresterol labs prior. On Crestor. Wants Carollee Herter to return her call.

## 2010-08-12 NOTE — Telephone Encounter (Signed)
Ok to repeat her lipid panel before her visit

## 2010-08-12 NOTE — Telephone Encounter (Signed)
Pt aware and will call back to make an appt

## 2010-08-23 ENCOUNTER — Other Ambulatory Visit: Payer: Self-pay | Admitting: Internal Medicine

## 2010-08-23 ENCOUNTER — Other Ambulatory Visit (INDEPENDENT_AMBULATORY_CARE_PROVIDER_SITE_OTHER): Payer: PRIVATE HEALTH INSURANCE | Admitting: Internal Medicine

## 2010-08-23 DIAGNOSIS — E785 Hyperlipidemia, unspecified: Secondary | ICD-10-CM

## 2010-08-23 LAB — LIPID PANEL
HDL: 73.7 mg/dL (ref >39.00)
Total CHOL/HDL Ratio: 3
VLDL: 25.4 mg/dL (ref 0.0–40.0)

## 2010-08-23 LAB — HEPATIC FUNCTION PANEL
Bilirubin, Direct: 0.1 mg/dL (ref 0.0–0.3)
Total Bilirubin: 0.6 mg/dL (ref 0.3–1.2)

## 2010-08-27 ENCOUNTER — Encounter: Payer: Self-pay | Admitting: Internal Medicine

## 2010-09-02 ENCOUNTER — Ambulatory Visit: Payer: Self-pay | Admitting: Internal Medicine

## 2010-09-03 ENCOUNTER — Ambulatory Visit (INDEPENDENT_AMBULATORY_CARE_PROVIDER_SITE_OTHER): Payer: PRIVATE HEALTH INSURANCE | Admitting: Internal Medicine

## 2010-09-03 ENCOUNTER — Encounter: Payer: Self-pay | Admitting: Internal Medicine

## 2010-09-03 VITALS — BP 144/82 | HR 72 | Ht 60.5 in | Wt 126.0 lb

## 2010-09-03 DIAGNOSIS — Z872 Personal history of diseases of the skin and subcutaneous tissue: Secondary | ICD-10-CM

## 2010-09-03 DIAGNOSIS — F909 Attention-deficit hyperactivity disorder, unspecified type: Secondary | ICD-10-CM

## 2010-09-03 DIAGNOSIS — IMO0001 Reserved for inherently not codable concepts without codable children: Secondary | ICD-10-CM

## 2010-09-03 DIAGNOSIS — E785 Hyperlipidemia, unspecified: Secondary | ICD-10-CM

## 2010-09-03 DIAGNOSIS — G609 Hereditary and idiopathic neuropathy, unspecified: Secondary | ICD-10-CM

## 2010-09-03 NOTE — Patient Instructions (Addendum)
Ok to try water aerobics again but perhaps.  limit to 30 minutes  Consider tai chi Call if having a problem.  Otherwise  rov 6 months wellness  ? If add med would help and recheck about memory.

## 2010-09-03 NOTE — Progress Notes (Signed)
  Subjective:    Patient ID: Julie Bonilla, female    DOB: [birth year], 70 y.o.   MRN: [id number]  HPI Pt comes in today for follow up of    multiple medical issues .  Hyperlipidemia: taking crestor daily and doesn't really think causing se . However when she does water aerobic for a while gets muscle aches . OA  Pain Meloxicam  onlyltaking   ocass not often...sometimes takes asa ocass helps roacea   Right shoulder   Problematic at times no injury or falls  Cymbalta and neurontin  600 tid  Stable and helps  Pain. Gets concerned about remembering what she is doing and focusing  Has hx of prob adhd and we have tried  straterra in the past.  Asks about doing a stress test to assess  cv status  She has no cp sob or cv pulm limitations at this time  Review of Systems No cp sob   Change in vision  problems . Peripheral neuropathy seems the same. Takes klonopin at night asks about taking melatonin  To help fall asleep    Objective:   Physical Exam WDWN in nad  Looks well  gaitnormal  Reviewed labs and findings and updated  Reviewed  Data  In EPIC.  Balance is very good.       Assessment & Plan:   Hyperlipidemia  ? If had se of crestor in the past . Unclear  At this time  Will closely observe about her muscle aches after doing aerobic but this could be conditioning and disc only slowing advancing activity. PN no change Sleep affected by rls sx and other  Attentional memory issues   : this seem like her adhd and normal aging  cautious to try a stimulant med at this point.    However consider having her neurologist address this issue at her next fu appt.  Pain OA and PN     Continue same med  Plan for now.  Twin has fibromyalgia   Disc Cv risk assessment limitations of any screening testing   .  Continue lifestyle intervention healthy eating and exercise . For now.  She appears to have no sx of  cv compromise.  Total visit > 50% spent counseling and coordinating care

## 2010-09-05 ENCOUNTER — Encounter: Payer: Self-pay | Admitting: Internal Medicine

## 2010-09-24 ENCOUNTER — Telehealth: Payer: Self-pay | Admitting: *Deleted

## 2010-09-24 MED ORDER — CLONAZEPAM 0.5 MG PO TABS: 0.5000 mg | ORAL_TABLET | Freq: Two times a day (BID) | ORAL | Status: DC | PRN

## 2010-09-24 NOTE — Telephone Encounter (Signed)
Refill on clonazepam. Last filled on 06/30/10 #180.

## 2010-09-24 NOTE — Telephone Encounter (Signed)
Per Dr. Fabian Sharp- ok x 1. Rx faxed back to pharmacy

## 2010-10-22 NOTE — Op Note (Signed)
NAMESAMAIA, IWATA                ACCOUNT NO.:  [id number]   MEDICAL RECORD NO.:  [id number]          PATIENT TYPE:  AMB   LOCATION:  [facility]                         FACILITY:  [facility]   PHYSICIAN:  Marlowe Kays, M.D.  DATE OF BIRTH:  1941-02-04   DATE OF PROCEDURE:  05/02/2006  DATE OF DISCHARGE:                                 OPERATIVE REPORT   PREOPERATIVE DIAGNOSES:  1. Torn medial and lateral menisci.  2. Tricompartmental degenerative arthritis.  3. Loose bodies.   POSTOPERATIVE DIAGNOSES:  1. Torn medial and lateral menisci.  2. Tricompartmental degenerative arthritis.  3. Loose bodies.  4. Medial shelf plica.   OPERATION:  Right knee arthroscopy with (1) partial medial and lateral  meniscectomies, (2) shaving of patella, (3) excision of medial shelf plica,  (4) generalized joint debridement with removal of loose bodies.   SURGEON:  Marlowe Kays, M.D.   ASSISTANT:  Nurse.   ANESTHESIA:  General.   PATHOLOGY AND JUSTIFICATION FOR PROCEDURE:  Because of persistent pain in  her right knee following injury, an MRI was performed showing the above  diagnoses and consequently she is here today for surgical treatment.   PROCEDURE:  Under satisfactory general anesthesia and pneumatic tourniquet,  right leg was esmarched out non-sterilely, thigh stabilizer applied, Ace  wrap and knee support to her left lower extremity.  The right leg was  prepped from stabilizer to ankle with DuraPrep and draped in a sterile  field, superomedial saline inflow.  First, through an anterolateral portal,  medial compartment of the knee joint was evaluated.  She had some very  minimal chondromalacia of the medial femoral condyle.  Several small loose  bodies were noted in the joint and I was able to evacuate these with the  shaver.  She did have bad degenerative tear involving the entire posterior  third of the medial meniscus.  The inner knee was quite tight.  I was able  to debride this  back to a stable rim with a combination of baskets and a 3.5  shaver.  She did have some posterior tibial plateau wear, presumably as  result of the tear digging in.  Looking up in the medial gutter and  suprapatellar area, the patella showed some wear, grade 2-3/4, which I  shaved down until smooth with 3.5 shaver.  She had some synovitis between  the patella and the femur which I debrided out and she also had a fairly  large medial shelf plica which I cut with arthroscopic scissors and then  resected the remnants with the 3.5 shaver.  I then reversed portals.  Laterally, she had a good bit of synovitis, which I resected for  visualization with a 3.5 shaver.  Her lateral meniscus was torn throughout  its entire extent and I debrided this back to a stable rim with baskets and  then shaved it down until smooth with the 3.5 shaver.  In the process, I  found 1 more loose osteocartilaginous body, which I removed with the shaver.  She had some partial detachment of the articular cartilage of the lateral  femoral  condyle, which I shaved down until smooth, and she also had some  wear of the lateral tibial plateau, which did not require shaving.  The knee  joint was then irrigated until clear and all fluid possible removed.  The 2  anterior portals I closed with 4-0 nylon.  I then injected through the  inflow apparatus 20 mL of 0.5% Marcaine with adrenalin and 4 mg of morphine,  removed the inflow apparatus and then closed this portal with 4-0 nylon as  well.  Betadine and dry sterile dressing were applied.  Tourniquet was  released.  She tolerated the procedure well and was taken to Recovery in  satisfactory condition with no known complications.           ______________________________  Marlowe Kays, M.D.     JA/MEDQ  D:  05/02/2006  T:  05/02/2006  Job:  [id number]

## 2010-11-17 ENCOUNTER — Ambulatory Visit: Payer: PRIVATE HEALTH INSURANCE | Admitting: Internal Medicine

## 2010-11-20 ENCOUNTER — Other Ambulatory Visit: Payer: Self-pay | Admitting: Internal Medicine

## 2010-12-25 ENCOUNTER — Other Ambulatory Visit: Payer: Self-pay | Admitting: Internal Medicine

## 2011-01-24 ENCOUNTER — Telehealth: Payer: Self-pay | Admitting: *Deleted

## 2011-01-24 MED ORDER — CLONAZEPAM 0.5 MG PO TABS: 0.5000 mg | ORAL_TABLET | Freq: Two times a day (BID) | ORAL | Status: DC | PRN

## 2011-01-24 NOTE — Telephone Encounter (Signed)
Per Dr. Fabian Sharp- okay x 2. Rx sent to pharmacy via fax rx.

## 2011-01-24 NOTE — Telephone Encounter (Signed)
Refill on clonazepam 0.5mg  last filled on 09/25/10 #180 LOV 09/03/10 NOV 03/21/11

## 2011-02-02 ENCOUNTER — Other Ambulatory Visit: Payer: Self-pay | Admitting: Internal Medicine

## 2011-03-14 ENCOUNTER — Encounter: Payer: PRIVATE HEALTH INSURANCE | Admitting: Internal Medicine

## 2011-03-21 ENCOUNTER — Encounter: Payer: Self-pay | Admitting: Internal Medicine

## 2011-03-21 ENCOUNTER — Ambulatory Visit (INDEPENDENT_AMBULATORY_CARE_PROVIDER_SITE_OTHER): Payer: PRIVATE HEALTH INSURANCE | Admitting: Internal Medicine

## 2011-03-21 VITALS — BP 130/80 | HR 60 | Ht 60.5 in | Wt 125.0 lb

## 2011-03-21 DIAGNOSIS — J479 Bronchiectasis, uncomplicated: Secondary | ICD-10-CM

## 2011-03-21 DIAGNOSIS — E785 Hyperlipidemia, unspecified: Secondary | ICD-10-CM

## 2011-03-21 DIAGNOSIS — F329 Major depressive disorder, single episode, unspecified: Secondary | ICD-10-CM

## 2011-03-21 DIAGNOSIS — Z0000 Encounter for general adult medical examination without abnormal findings: Secondary | ICD-10-CM

## 2011-03-21 DIAGNOSIS — Z23 Encounter for immunization: Secondary | ICD-10-CM

## 2011-03-21 DIAGNOSIS — H9190 Unspecified hearing loss, unspecified ear: Secondary | ICD-10-CM

## 2011-03-21 DIAGNOSIS — K219 Gastro-esophageal reflux disease without esophagitis: Secondary | ICD-10-CM

## 2011-03-21 DIAGNOSIS — M21619 Bunion of unspecified foot: Secondary | ICD-10-CM

## 2011-03-21 DIAGNOSIS — M899 Disorder of bone, unspecified: Secondary | ICD-10-CM

## 2011-03-21 DIAGNOSIS — Z79899 Other long term (current) drug therapy: Secondary | ICD-10-CM

## 2011-03-21 DIAGNOSIS — M949 Disorder of cartilage, unspecified: Secondary | ICD-10-CM

## 2011-03-21 DIAGNOSIS — D509 Iron deficiency anemia, unspecified: Secondary | ICD-10-CM

## 2011-03-21 DIAGNOSIS — G609 Hereditary and idiopathic neuropathy, unspecified: Secondary | ICD-10-CM

## 2011-03-21 DIAGNOSIS — E875 Hyperkalemia: Secondary | ICD-10-CM

## 2011-03-21 DIAGNOSIS — G4762 Sleep related leg cramps: Secondary | ICD-10-CM

## 2011-03-21 LAB — HEPATIC FUNCTION PANEL
ALT: 18 U/L (ref 0–35)
AST: 21 U/L (ref 0–37)
Albumin: 4.3 g/dL (ref 3.5–5.2)
Alkaline Phosphatase: 57 U/L (ref 39–117)
Total Bilirubin: 0.5 mg/dL (ref 0.3–1.2)

## 2011-03-21 LAB — CBC WITH DIFFERENTIAL/PLATELET
Eosinophils Relative: 3.8 % (ref 0.0–5.0)
HCT: 44 % (ref 36.0–46.0)
Hemoglobin: 14.6 g/dL (ref 12.0–15.0)
Lymphocytes Relative: 45 % (ref 12.0–46.0)
Lymphs Abs: 2.3 10*3/uL (ref 0.7–4.0)
Monocytes Relative: 7.7 % (ref 3.0–12.0)
Neutro Abs: 2.2 10*3/uL (ref 1.4–7.7)
WBC: 5.2 10*3/uL (ref 4.5–10.5)

## 2011-03-21 LAB — BASIC METABOLIC PANEL
BUN: 20 mg/dL (ref 6–23)
Calcium: 9.7 mg/dL (ref 8.4–10.5)
Creatinine, Ser: 0.8 mg/dL (ref 0.4–1.2)
GFR: 81.04 mL/min (ref >60.00)
Glucose, Bld: 108 mg/dL — ABNORMAL HIGH (ref 70–99)
Potassium: 6.5 mEq/L (ref 3.5–5.1)

## 2011-03-21 LAB — IBC PANEL
Iron: 78 ug/dL (ref 42–145)
Saturation Ratios: 23.5 % (ref 20.0–50.0)
Transferrin: 237.3 mg/dL (ref 212.0–360.0)

## 2011-03-21 LAB — LIPID PANEL
Cholesterol: 234 mg/dL — ABNORMAL HIGH (ref 0–200)
Total CHOL/HDL Ratio: 3

## 2011-03-21 LAB — FERRITIN: Ferritin: 88 ng/mL (ref 10.0–291.0)

## 2011-03-21 MED ORDER — DULOXETINE HCL 60 MG PO CPEP: ORAL_CAPSULE | ORAL | Status: DC

## 2011-03-21 NOTE — Patient Instructions (Addendum)
Continue lifestyle intervention healthy eating and exercise . Fall prevention. Will notify you  of labs when available.   Consider hearing aids  Evaluation Can see foot specialist at  [place] [facility] but take care with surgery on feet because you have neuropathy.

## 2011-03-21 NOTE — Progress Notes (Signed)
Subjective:    Patient ID: Julie Bonilla, female    DOB: 06-21-1940, 70 y.o.   MRN: [id number]  HPI Patient comes in today for preventive visit and follow-up of medical issues. Update of her history since her last visit. No major changes to her personal health Step son   Died ifrom Mi  Brother  Died this summer Taking 2 cymbalta per day  Helping depression.  No new side effect  ? About bunions and doing surgery    Hearing: decreased no change  Vision:  No limitations at present .  Safety:  Has smoke detector and wears seat belts.  No firearms. No excess sun exposure. Sees dentist regularly.  Falls:  No and cautious but no afraid of falling  Advance directive :  Reviewed  Has one.  Memory: Felt to be good  , no concern from her or her family.  Depression: No anhedonia unusual crying or depressive symptoms  Nutrition: Eats well balanced diet; adequate calcium and vitamin D. No swallowing chewiing problems.  Injury: no major injuries in the last six months.  Other healthcare providers:  Reviewed today .  Social:  Lives with husband married. No pets.   Preventive parameters: up-to-date on colonoscopy, mammogram, immunizations. Including Tdap and pneumovax.  ADLS:   There are no problems or need for assistance  driving, feeding, obtaining food, dressing, toileting and bathing, managing money using phone. She is independent.    Review of Systems ROS:  GEN/ HEENTNo fever, significant weight changes sweats headaches vision problems hearing changes, CV/ PULM; No chest pain shortness of breath cough, syncope,edema  change in exercise tolerance. No flare of bronchiecteis GI /GU: No adominal pain, vomiting, change in bowel habits. No blood in the stool. No significant GU symptoms. SKIN/HEME: ,no acute skin rashes suspicious lesions or bleeding. No lymphadenopathy, nodules, masses. Has given iron and taken extra iron.  NEURO/ PSYCH:  No change in PN  No falling  As per hpin    IMM/ Allergy: No unusual infections.    REST of 12 system review negative   Past history family history social history reviewed in the electronic medical record.     Objective:   Physical Exam Physical Exam: Vital signs reviewed NGE:XBMW is a well-developed well-nourished alert cooperative  white female who appears her stated age in no acute distress.  HEENT: normocephalic  traumatic , Eyes: PERRL EOM's full, conjunctiva clear, Nares: paten,t no deformity discharge or tenderness., Ears: no deformity EAC's clear TMs with normal landmarks. Mouth: clear OP, no lesions, edema.  Moist mucous membranes. Dentition in adequate repair. NECK: supple without masses, thyromegaly or bruits. CHEST/PULM:  Clear to auscultation and percussion breath sounds equal no wheeze , rales or rhonchi. No chest wall deformities or tenderness. CV: PMI is nondisplaced, S1 S2 no gallops, murmurs, rubs. Peripheral pulses are full without delay.No JVD .  ABDOMEN: Bowel sounds normal nontender  No guard or rebound, no hepato splenomegal no CVA tenderness.  No hernia. Extremtities:  No clubbing cyanosis or edema, no acute joint swelling or redness no focal atrophy bunion bilaterally mild  NEURO:  Oriented x3, cranial nerves 3-12 appear to be intact, no obvious focal weakness,gait within normal limits no abnormal reflexes or asymmetrical SKIN: No acute rashes normal turgor, color, no bruising or petechiae. PSYCH: Oriented, good eye contact, no obvious depression anxiety, cognition and judgment appear normal. Breast: normal by inspection . No dimpling, discharge, masses, tenderness or discharge . LN: no cervical axillary inguinal adenopathy Pelvic: NL  ext GU, labia clear without lesions or rash . Vagina no lesions .  UTERUS: Neg CMT Adnexa:  clear no masses .       Assessment & Plan:  Preventive Health Care Counseled regarding healthy nutrition, exercise, sleep, injury prevention, calcium vit d and healthy weight . Up  to date  on healthcare parameters per hx   Mood  Depression  Coping with family losses  PN  No change LIPIDS: taking low dose meds  5 mg crestor  No se  RLS probably  Vs other  Taking clonipen at night Hearing loss stable  Bunions  Mild  Caution about surgery with PN   .   Medicare Attestation I have personally reviewed: The patient's medical and social history Their use of alcohol, tobacco or illicit drugs Their current medications and supplements The patient's functional ability including ADLs,fall risks, home safety risks, cognitive, and hearing and visual impairment Diet and physical activities Evidence for depression or mood disorders  The patient's weight, height, BMI, and visual acuity have been recorded in the chart.  I have made referrals, counseling, and provided education to the patient based on review of the above and I have provided the patient with a written personalized care plan for preventive services.

## 2011-03-22 ENCOUNTER — Encounter: Payer: Self-pay | Admitting: *Deleted

## 2011-03-22 ENCOUNTER — Other Ambulatory Visit (INDEPENDENT_AMBULATORY_CARE_PROVIDER_SITE_OTHER): Payer: PRIVATE HEALTH INSURANCE

## 2011-03-22 DIAGNOSIS — E875 Hyperkalemia: Secondary | ICD-10-CM

## 2011-03-22 LAB — BASIC METABOLIC PANEL
Calcium: 9 mg/dL (ref 8.4–10.5)
Creatinine, Ser: 0.7 mg/dL (ref 0.4–1.2)
GFR: 86.33 mL/min (ref >60.00)
Sodium: 142 mEq/L (ref 135–145)

## 2011-03-22 LAB — MAGNESIUM: Magnesium: 2.3 mg/dL (ref 1.5–2.5)

## 2011-03-22 NOTE — Progress Notes (Signed)
Pt aware of results 

## 2011-03-24 DIAGNOSIS — M21619 Bunion of unspecified foot: Secondary | ICD-10-CM | POA: Insufficient documentation

## 2011-03-24 HISTORY — DX: Bunion of unspecified foot: M21.619

## 2011-03-24 NOTE — Assessment & Plan Note (Signed)
Consider looking into hearing aids  again

## 2011-04-19 ENCOUNTER — Telehealth: Payer: Self-pay | Admitting: *Deleted

## 2011-04-19 NOTE — Telephone Encounter (Signed)
It is certainly ok to go off .  If it really is the med we probably try something else again. Ok to remain off and discuss at next ov.

## 2011-04-19 NOTE — Telephone Encounter (Signed)
Pt is taking crestor 5mg  and is having joint pains. This started 2 weeks. She has been on crestor for over 1 year. She states that sometimes that she has to go off it for a 3 months then goes back on it. Then she is fine for a while. Is this okay to do?

## 2011-04-19 NOTE — Telephone Encounter (Signed)
Pt aware of this. 

## 2011-05-03 ENCOUNTER — Telehealth: Payer: Self-pay | Admitting: Internal Medicine

## 2011-05-03 NOTE — Telephone Encounter (Signed)
Told pt bring it in with her at her next appt. Pt aware of this.

## 2011-05-03 NOTE — Telephone Encounter (Signed)
Pt bought a blood pressure meter at pharmacy and pt is wondering if there is a way for the nurse to calibrate it with the bp machine here?

## 2011-05-30 ENCOUNTER — Encounter: Payer: Self-pay | Admitting: Internal Medicine

## 2011-05-30 ENCOUNTER — Ambulatory Visit (INDEPENDENT_AMBULATORY_CARE_PROVIDER_SITE_OTHER): Payer: PRIVATE HEALTH INSURANCE | Admitting: Internal Medicine

## 2011-05-30 DIAGNOSIS — G609 Hereditary and idiopathic neuropathy, unspecified: Secondary | ICD-10-CM

## 2011-05-30 DIAGNOSIS — M21619 Bunion of unspecified foot: Secondary | ICD-10-CM

## 2011-05-30 DIAGNOSIS — J029 Acute pharyngitis, unspecified: Secondary | ICD-10-CM | POA: Insufficient documentation

## 2011-05-30 LAB — POCT RAPID STREP A (OFFICE): Rapid Strep A Screen: NEGATIVE

## 2011-05-30 NOTE — Patient Instructions (Signed)
SAlt water gargles  If throat problem continues  Without improvement in another few weeks call fo advice poss reevaluation. This may be from some sinus drainage.   Consider calling ortho office ans asking opinion from the  Foot specialist about   Foot surgery. Caution because of peripheral neuropathy.

## 2011-05-30 NOTE — Progress Notes (Signed)
  Subjective:    Patient ID: Julie Bonilla, female    DOB: 02/06/1941, 70 y.o.   MRN: [id number]  HPI Patient comes in today for follow up of  a couple of medical issues.  Problem with bunion hard to put shoes on his head 2 different opinions about the type of surgery that could be done to correct . Has seen Dr Charlsie Merles and [place]  2 surgical methods for   This  one is less invasive Less invasive.  She wishes my advice. No sores or ulcers or change in her neuropathy.  Also having problem with her throat most recently intermittent hoarseness which she has had for while but feeling of ulcers in the way in the back of her throat for the last week or 2 without associated fever she does use many polyps but no sinus pain occasional cough which she has had with her bronchiectasis no hemoptysis fever or period no true dysphasia.    Review of Systems No fever shortness of breath wheezing syncope ulcers bleeding rest no change from last visit.  Past history family history social history reviewed in the electronic medical record.     Objective:   Physical Exam HEENT: Normocephalic ;atraumatic , Eyes;  PERRL, EOMs  Full, lids and conjunctiva clear,,Ears: no deformities, canals nl, TM landmarks normal, Nose: no deformity or discharge  Mouth : OP clear without ulcer seen but there is some redness posteriorly but no masses seen or exudate. Neck supple without adenopathy masses or bruits. Chest clear to auscultation today with no wheezes or rales or rhonchi.  Feet mild to moderate bunion No ulcers or unusual calluses otherwise..  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 Discussed options of not qualified to comment on the type of surgery that is appropriate however because she has neuropathy it is reasonable to get opinions more information. She can consider discussing with orthopedic foot specialist or asked Dr. Merla Riches himself about procedure options and  followup.  Sore throat I do not see ulcers today but may have had these there is some mild redness this possibly could be from postnasal drainage she has some intermittent hoarseness but no other alarm symptoms. If this is persistent or progressive consider other evaluation. It is quite red rule out strep.  Peripheral neuropathy no change  She will follow up when do or if things are not better  Total visit > 50% spent counseling and coordinating care

## 2011-06-16 ENCOUNTER — Other Ambulatory Visit: Payer: Self-pay | Admitting: Internal Medicine

## 2011-07-25 ENCOUNTER — Telehealth: Payer: Self-pay | Admitting: *Deleted

## 2011-07-25 NOTE — Telephone Encounter (Signed)
Pt needs medical clearance for Bunion surgery. Pt had a cpx 03/21/11. Can you clear her for surgery or do you need her to come in for a Medical Clearance?

## 2011-07-26 ENCOUNTER — Other Ambulatory Visit: Payer: Self-pay | Admitting: Internal Medicine

## 2011-07-26 DIAGNOSIS — Z0289 Encounter for other administrative examinations: Secondary | ICD-10-CM

## 2011-07-26 NOTE — Telephone Encounter (Signed)
Per Dr. Panosh- ok x 6  

## 2011-07-26 NOTE — Telephone Encounter (Signed)
Form completed    To go ahead with surgery   Dr Victorino Dike.  Left osteotomy metatarsal first hallux  Keller/ Mcbride/ [facility] procedure

## 2011-07-27 ENCOUNTER — Telehealth: Payer: Self-pay | Admitting: *Deleted

## 2011-07-27 MED ORDER — CLONAZEPAM 0.5 MG PO TABS: 0.5000 mg | ORAL_TABLET | Freq: Two times a day (BID) | ORAL | Status: DC | PRN

## 2011-07-27 NOTE — Telephone Encounter (Signed)
Ok x 1

## 2011-07-27 NOTE — Telephone Encounter (Signed)
Rx called in 

## 2011-07-27 NOTE — Telephone Encounter (Signed)
Refill on clonazepam 0.5mg  last ov 05/30/11 NOV 09/19/11

## 2011-09-19 ENCOUNTER — Ambulatory Visit (INDEPENDENT_AMBULATORY_CARE_PROVIDER_SITE_OTHER): Payer: PRIVATE HEALTH INSURANCE | Admitting: Internal Medicine

## 2011-09-19 ENCOUNTER — Encounter: Payer: Self-pay | Admitting: Internal Medicine

## 2011-09-19 VITALS — BP 100/70 | HR 83 | Temp 98.2°F | Wt 126.0 lb

## 2011-09-19 DIAGNOSIS — K219 Gastro-esophageal reflux disease without esophagitis: Secondary | ICD-10-CM

## 2011-09-19 DIAGNOSIS — M21619 Bunion of unspecified foot: Secondary | ICD-10-CM

## 2011-09-19 DIAGNOSIS — R05 Cough: Secondary | ICD-10-CM

## 2011-09-19 DIAGNOSIS — J479 Bronchiectasis, uncomplicated: Secondary | ICD-10-CM

## 2011-09-19 DIAGNOSIS — G609 Hereditary and idiopathic neuropathy, unspecified: Secondary | ICD-10-CM

## 2011-09-19 DIAGNOSIS — E785 Hyperlipidemia, unspecified: Secondary | ICD-10-CM

## 2011-09-19 DIAGNOSIS — R6889 Other general symptoms and signs: Secondary | ICD-10-CM

## 2011-09-19 DIAGNOSIS — D509 Iron deficiency anemia, unspecified: Secondary | ICD-10-CM

## 2011-09-19 LAB — IBC PANEL
Iron: 102 ug/dL (ref 42–145)
Saturation Ratios: 34.1 % (ref 20.0–50.0)

## 2011-09-19 LAB — BASIC METABOLIC PANEL
Calcium: 9.3 mg/dL (ref 8.4–10.5)
Chloride: 105 mEq/L (ref 96–112)
Creatinine, Ser: 0.7 mg/dL (ref 0.4–1.2)

## 2011-09-19 LAB — FERRITIN: Ferritin: 92.5 ng/mL (ref 10.0–291.0)

## 2011-09-19 LAB — LIPID PANEL
Cholesterol: 303 mg/dL — ABNORMAL HIGH (ref 0–200)
Triglycerides: 140 mg/dL (ref 0.0–149.0)

## 2011-09-19 MED ORDER — AMOXICILLIN 500 MG PO CAPS: 500.0000 mg | ORAL_CAPSULE | Freq: Three times a day (TID) | ORAL | Status: AC

## 2011-09-19 NOTE — Progress Notes (Signed)
  Subjective:    Patient ID: Julie Bonilla, female    DOB: [birth year], 71 y.o.   MRN: [id number]  HPI  Patient comes in today for follow up of  multiple medical problems.   New sx :Cough up  Bad tasting phlegm  About 2-3 weeks.  No sob . Has hx of bronchiesctesis no fever chills , some throat clearing also .   Recurring hoarseness and cough   Just  Back on cymbalta  And helps the neuropathy  Pain   Want to continue with this; no falling  Had surgery  Of foot  Dr Victorino Dike. Left and doing well.   Knee pain after walking funny .  But getting better  LIPID:Had hurting  On statin and now off.   Crestor.    iron deficiency and restless legs ; taking iron and vit c once a day   And no bleeding   Nose noted   Throat clearing and hoarseness no Hb per see  Ongoing without dysphagia   Review of Systems Neg for constitutional sx NVD new skin rash vision changes  Mood stable  Past history family history social history reviewed in the electronic medical record.     Objective:   Physical Exam BP 100/70  Pulse 83  Temp(Src) 98.2 F (36.8 C) (Oral)  Wt 126 lb (57.153 kg)  SpO2 96% WDWN in  nad HEENT grossly normal op clear nose patent Neck: Supple without adenopathy or masses or bruits Chest:  Clear to A&P without wheezes rales or rhonchi dec bs bases CV:  S1-S2 no gallops or murmurs peripheral perfusion is normal NEURO   No motor changes Feet  No ulcers  No swelling  Oriented x 3. Normal cognition, attention, speech. Not anxious or depressed appearing   Good eye contact . Labs reviewed  Lab Results  Component Value Date   WBC 5.2 03/21/2011   HGB 14.6 03/21/2011   HCT 44.0 03/21/2011   PLT 211.0 03/21/2011   GLUCOSE 93 09/19/2011   CHOL 303* 09/19/2011   TRIG 140.0 09/19/2011   HDL 63.40 09/19/2011   LDLDIRECT 204.5 09/19/2011   LDLCALC 101* 02/16/2009   ALT 18 03/21/2011   AST 21 03/21/2011   NA 139 09/19/2011   K 5.2* 09/19/2011   CL 105 09/19/2011   CREATININE 0.7 09/19/2011   BUN 19 09/19/2011   CO2 26 09/19/2011   TSH 1.94 03/21/2011   HGBA1C 5.8 03/21/2011       Assessment & Plan:   Cough productive : bronchiecteis  And flare . Add antibiotic  And   Expectant management. PN  : cymbalta helps  Continue  Iron defic : Check today  hoarse and throat clearing; poss gerd  But rx infeciton for now  Get chest x ray f continuing   Order put in EHR to get if not getting better  LIPIDS;  Very high : had se of meds   Check today  Consider other intervention but no other risk except age  Had no known vascular disease or dm.

## 2011-09-19 NOTE — Patient Instructions (Signed)
Take  Antibiotic for chest infection.   If not getting better can try prilosec  Each day for 2-3 weeks and if not getting better then follow up.   Labs today and will let you know results.  Wellness check in 6 months or as needed

## 2011-09-21 ENCOUNTER — Encounter: Payer: Self-pay | Admitting: Internal Medicine

## 2011-09-25 DIAGNOSIS — R6889 Other general symptoms and signs: Secondary | ICD-10-CM | POA: Insufficient documentation

## 2011-09-25 DIAGNOSIS — R0989 Other specified symptoms and signs involving the circulatory and respiratory systems: Secondary | ICD-10-CM | POA: Insufficient documentation

## 2011-09-25 DIAGNOSIS — R05 Cough: Secondary | ICD-10-CM | POA: Insufficient documentation

## 2011-10-10 ENCOUNTER — Telehealth: Payer: Self-pay

## 2011-10-10 NOTE — Telephone Encounter (Signed)
Voicemail:  Pt called and states she is having heart burn or indigestion.  Pt states "it feels like something is stuck in her chest".  Pt states she has been taking Prilosec and milk of magnesia and wanted to know if Dr. Fabian Sharp had any recommendations.  Pls advise.

## 2011-10-10 NOTE — Telephone Encounter (Signed)
?   Fever ? How long  Can increase to bid prilosec  Or change to OTC prevacid  but if not getting better  In a week or less then  OV .  Ov if having choking or respiratory distress.

## 2011-10-10 NOTE — Telephone Encounter (Signed)
Pt denies fever.  Per pt it has been going on since Saturday.  Pt states she read the Prilosec package and it says it may not work for the first couple of days.  Pt called the pharmacy and was told she could take the milk of magnesia as long as if it was 1 hour before or after her medication.  Pt denies choking or respiratory distress.  Pt states she is aware that an ov is needed if pt has choking, respiratory distress,or if pt is not getting better.  Pt states she understands Dr. Rosezella Florida recommendations and will call at the end of th week if she is not better.

## 2011-10-13 ENCOUNTER — Other Ambulatory Visit: Payer: Self-pay | Admitting: Internal Medicine

## 2011-11-02 ENCOUNTER — Telehealth: Payer: Self-pay | Admitting: Family Medicine

## 2011-11-02 NOTE — Telephone Encounter (Signed)
We received notification that this patient did not get the chest xray that was ordered 09/19/11. Thank you. 

## 2011-11-04 ENCOUNTER — Other Ambulatory Visit: Payer: Self-pay

## 2011-11-04 MED ORDER — CLONAZEPAM 0.5 MG PO TABS: ORAL_TABLET | ORAL | Status: DC

## 2011-11-04 NOTE — Telephone Encounter (Signed)
Ok per Dr. Fabian Sharp to call rx in to pharmacy for 6 months.

## 2011-11-04 NOTE — Telephone Encounter (Signed)
Ok if she is better but ask how she is doing if not already done.

## 2011-11-04 NOTE — Telephone Encounter (Signed)
Left a message for pt to return call 

## 2011-11-07 MED ORDER — MELOXICAM 7.5 MG PO TABS: 7.5000 mg | ORAL_TABLET | Freq: Two times a day (BID) | ORAL | Status: DC

## 2011-11-07 NOTE — Telephone Encounter (Signed)
Called and spoke with pt about Dr. Rosezella Florida recommendations.  Pt states she is aware.  Pt stats she takes the antiinflammatory when needed.

## 2011-11-07 NOTE — Telephone Encounter (Signed)
Pt states she is doing much better. Pt states she took the prevacid for 2 weeks before she felt better.  Pt sates she is still regurgitating some but feels better and is not hurting as much in her breast bone.  Pt would like to know if she can stay on prevacid everyday?  Pls advise   Pt states she is having trouble with her knees and would like some Mobic called in to the pharmacy.  Pt states she has not been on this medication for a while.

## 2011-11-07 NOTE — Telephone Encounter (Signed)
Ok to stay on prevacid for now. Would take it if on antiinflammatory     Ok to rx mobic  7.5 mg  1 po bid prn joint pain disp 60 refill x 1    but not to take regularly . This is an antiinflammatory and  Give risk of bleeding and interfere with renal function.

## 2011-11-18 ENCOUNTER — Ambulatory Visit: Payer: PRIVATE HEALTH INSURANCE | Admitting: Internal Medicine

## 2011-12-12 ENCOUNTER — Ambulatory Visit: Payer: PRIVATE HEALTH INSURANCE | Admitting: Internal Medicine

## 2012-01-12 ENCOUNTER — Encounter: Payer: Self-pay | Admitting: Internal Medicine

## 2012-01-12 ENCOUNTER — Ambulatory Visit (INDEPENDENT_AMBULATORY_CARE_PROVIDER_SITE_OTHER): Payer: PRIVATE HEALTH INSURANCE | Admitting: Internal Medicine

## 2012-01-12 VITALS — BP 150/80 | HR 85 | Temp 97.8°F | Wt 127.0 lb

## 2012-01-12 DIAGNOSIS — H9190 Unspecified hearing loss, unspecified ear: Secondary | ICD-10-CM

## 2012-01-12 DIAGNOSIS — R251 Tremor, unspecified: Secondary | ICD-10-CM | POA: Insufficient documentation

## 2012-01-12 DIAGNOSIS — R5381 Other malaise: Secondary | ICD-10-CM

## 2012-01-12 DIAGNOSIS — R259 Unspecified abnormal involuntary movements: Secondary | ICD-10-CM

## 2012-01-12 DIAGNOSIS — H9209 Otalgia, unspecified ear: Secondary | ICD-10-CM

## 2012-01-12 DIAGNOSIS — G609 Hereditary and idiopathic neuropathy, unspecified: Secondary | ICD-10-CM

## 2012-01-12 DIAGNOSIS — R531 Weakness: Secondary | ICD-10-CM | POA: Insufficient documentation

## 2012-01-12 DIAGNOSIS — H9203 Otalgia, bilateral: Secondary | ICD-10-CM | POA: Insufficient documentation

## 2012-01-12 DIAGNOSIS — J479 Bronchiectasis, uncomplicated: Secondary | ICD-10-CM

## 2012-01-12 MED ORDER — DOXYCYCLINE HYCLATE 100 MG PO CAPS: 100.0000 mg | ORAL_CAPSULE | Freq: Two times a day (BID) | ORAL | Status: AC

## 2012-01-12 NOTE — Patient Instructions (Signed)
The  Pain may be from your jaw joint  Try taking meloxicam for a week   To  See if it calms down  .  Also can use HCS in itchy ear a couple times a day. The tremor could be familial  If  persistent or progressive we can do further eval and get  Neurology to see you consider other meds.   If getting increasing  Infected phlegm  Can add antibiotic for lungs.   Calender the weak spells  And get back with [name] if  Concerning.
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
## 2012-01-23 ENCOUNTER — Other Ambulatory Visit: Payer: Self-pay | Admitting: Internal Medicine

## 2012-01-25 ENCOUNTER — Telehealth: Payer: Self-pay | Admitting: Family Medicine

## 2012-01-25 NOTE — Telephone Encounter (Signed)
Message sent to [facility].  Waiting on answer.

## 2012-01-25 NOTE — Telephone Encounter (Signed)
Pt needs refills.  Last filled 07/26/11 3180 with 5 refills.  Pt last seen 01/12/12 for ear pain.  Directions say to take 1 capsule po tid and increase to 2 tid.  Should she only be taking 2 tid?  How many refills?  Please advise.  Thanks!!!

## 2012-01-26 NOTE — Telephone Encounter (Signed)
Ok to refill x 6 months   Please ask her how much she is actually taking and correct the sig   Thanks  [facility]

## 2012-01-27 ENCOUNTER — Encounter: Payer: Self-pay | Admitting: Internal Medicine

## 2012-01-27 ENCOUNTER — Other Ambulatory Visit: Payer: Self-pay | Admitting: Family Medicine

## 2012-01-27 DIAGNOSIS — Z1239 Encounter for other screening for malignant neoplasm of breast: Secondary | ICD-10-CM

## 2012-01-27 NOTE — Telephone Encounter (Signed)
Patient taking 2 tid.  Changed in the chart.  Sent to the pharmacy by e-scribe.

## 2012-02-01 ENCOUNTER — Telehealth: Payer: Self-pay | Admitting: Family Medicine

## 2012-02-01 NOTE — Telephone Encounter (Signed)
Pt left message on my voicemail.  She is still complaining of a dry cough.  No other sx.  Called Prevacid customer service line.  They informed her that this is an unusual side effect but does sometimes happen.  She would like to know what you think.  Please advise.  Thanks!!!

## 2012-02-02 NOTE — Telephone Encounter (Signed)
Tell pt that sometimes  Acid blockers could do this but  Not that common.   Please tell her to get chest x ray  That we ordered in [facility]  to be sure it is no change  Also she could take the antibiotic   As we discussed even though a dry cough and then have her fu with [name] or pulmonary if persisting  Cough

## 2012-02-02 NOTE — Telephone Encounter (Signed)
Left message on voicemail for the pt to return my call. 

## 2012-02-03 ENCOUNTER — Ambulatory Visit (INDEPENDENT_AMBULATORY_CARE_PROVIDER_SITE_OTHER)
Admission: RE | Admit: 2012-02-03 | Discharge: 2012-02-03 | Disposition: A | Discharge status: Home / Self Care | Payer: PRIVATE HEALTH INSURANCE | Source: Ambulatory Visit | Attending: Internal Medicine | Admitting: Internal Medicine

## 2012-02-03 DIAGNOSIS — D509 Iron deficiency anemia, unspecified: Secondary | ICD-10-CM

## 2012-02-03 DIAGNOSIS — J479 Bronchiectasis, uncomplicated: Secondary | ICD-10-CM

## 2012-02-03 DIAGNOSIS — R05 Cough: Secondary | ICD-10-CM

## 2012-02-03 DIAGNOSIS — M21619 Bunion of unspecified foot: Secondary | ICD-10-CM

## 2012-02-03 DIAGNOSIS — R6889 Other general symptoms and signs: Secondary | ICD-10-CM

## 2012-02-03 DIAGNOSIS — G609 Hereditary and idiopathic neuropathy, unspecified: Secondary | ICD-10-CM

## 2012-02-03 DIAGNOSIS — K219 Gastro-esophageal reflux disease without esophagitis: Secondary | ICD-10-CM

## 2012-02-03 DIAGNOSIS — E785 Hyperlipidemia, unspecified: Secondary | ICD-10-CM

## 2012-02-03 NOTE — Telephone Encounter (Signed)
Patient notified.  She will go have chest x-ray and pick up antibiotic from the pharmacy.  She will call us or pulmonary if cough persists.  She reports the cough is better.  She will also call us back if heartburn worsens.

## 2012-02-07 ENCOUNTER — Other Ambulatory Visit: Payer: Self-pay | Admitting: Family Medicine

## 2012-02-07 DIAGNOSIS — R05 Cough: Secondary | ICD-10-CM

## 2012-02-07 DIAGNOSIS — J479 Bronchiectasis, uncomplicated: Secondary | ICD-10-CM

## 2012-02-10 NOTE — Telephone Encounter (Signed)
Patient can try Pepcid or ranitidin per Dr Fabian Sharp.  Spoke with patient and she agrees.

## 2012-02-21 ENCOUNTER — Other Ambulatory Visit: Payer: Self-pay | Admitting: Internal Medicine

## 2012-02-22 ENCOUNTER — Ambulatory Visit (INDEPENDENT_AMBULATORY_CARE_PROVIDER_SITE_OTHER): Payer: Medicare Other | Admitting: Internal Medicine

## 2012-02-22 ENCOUNTER — Ambulatory Visit
Admission: RE | Admit: 2012-02-22 | Discharge: 2012-02-22 | Disposition: A | Discharge status: Home / Self Care | Payer: Medicare Other | Source: Ambulatory Visit | Attending: Internal Medicine | Admitting: Internal Medicine

## 2012-02-22 DIAGNOSIS — Z1239 Encounter for other screening for malignant neoplasm of breast: Secondary | ICD-10-CM

## 2012-02-22 DIAGNOSIS — R05 Cough: Secondary | ICD-10-CM

## 2012-02-22 LAB — PULMONARY FUNCTION TEST

## 2012-02-22 NOTE — Progress Notes (Signed)
PFT done today. 

## 2012-03-12 ENCOUNTER — Telehealth: Payer: Self-pay | Admitting: Family Medicine

## 2012-03-12 NOTE — Telephone Encounter (Signed)
Pt would like the results of her "breathing test."  Please advise.  Thanks!!

## 2012-03-13 NOTE — Telephone Encounter (Signed)
Tell pt that her pfts spirometry is normal.    Get her a copy  If she is still coughing suggest  ROV or refer to pulmonary

## 2012-03-14 NOTE — Telephone Encounter (Signed)
Pt notified of results by telephone.  She does not want to be referred to pulmonary.

## 2012-04-03 ENCOUNTER — Other Ambulatory Visit: Payer: Self-pay

## 2012-04-05 ENCOUNTER — Ambulatory Visit (INDEPENDENT_AMBULATORY_CARE_PROVIDER_SITE_OTHER): Payer: Medicare Other | Admitting: Internal Medicine

## 2012-04-05 ENCOUNTER — Encounter: Payer: Self-pay | Admitting: Internal Medicine

## 2012-04-05 VITALS — BP 150/84 | HR 90 | Temp 97.7°F | Wt 126.0 lb

## 2012-04-05 DIAGNOSIS — R6889 Other general symptoms and signs: Secondary | ICD-10-CM

## 2012-04-05 DIAGNOSIS — K219 Gastro-esophageal reflux disease without esophagitis: Secondary | ICD-10-CM | POA: Insufficient documentation

## 2012-04-05 DIAGNOSIS — J479 Bronchiectasis, uncomplicated: Secondary | ICD-10-CM

## 2012-04-05 DIAGNOSIS — G609 Hereditary and idiopathic neuropathy, unspecified: Secondary | ICD-10-CM

## 2012-04-05 DIAGNOSIS — Z23 Encounter for immunization: Secondary | ICD-10-CM

## 2012-04-05 MED ORDER — PANTOPRAZOLE SODIUM 40 MG PO TBEC: 40.0000 mg | DELAYED_RELEASE_TABLET | Freq: Every day | ORAL | Status: DC

## 2012-04-05 NOTE — Patient Instructions (Signed)
Someone will contact you about referral to GI about your severe GERD sx .  Continue lifestyle intervention healthy eating and exercise . Will fax  Or try to arrange  protonix rx to [facility] drugs ( 20 $ per 90 days ) as you are in the donut hole . I am not sure how this works  In the interim stay on the  Prevacid.
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
## 2012-04-06 ENCOUNTER — Encounter: Payer: Self-pay | Admitting: Internal Medicine

## 2012-04-16 ENCOUNTER — Other Ambulatory Visit (INDEPENDENT_AMBULATORY_CARE_PROVIDER_SITE_OTHER): Payer: Medicare Other

## 2012-04-16 DIAGNOSIS — R05 Cough: Secondary | ICD-10-CM

## 2012-04-16 DIAGNOSIS — D649 Anemia, unspecified: Secondary | ICD-10-CM

## 2012-04-16 DIAGNOSIS — J479 Bronchiectasis, uncomplicated: Secondary | ICD-10-CM

## 2012-04-16 DIAGNOSIS — I10 Essential (primary) hypertension: Secondary | ICD-10-CM

## 2012-04-16 DIAGNOSIS — E119 Type 2 diabetes mellitus without complications: Secondary | ICD-10-CM

## 2012-04-16 DIAGNOSIS — E785 Hyperlipidemia, unspecified: Secondary | ICD-10-CM

## 2012-04-16 LAB — CBC WITH DIFFERENTIAL/PLATELET
Eosinophils Absolute: 0.2 10*3/uL (ref 0.0–0.7)
Lymphs Abs: 1.7 10*3/uL (ref 0.7–4.0)
MCHC: 33 g/dL (ref 30.0–36.0)
MCV: 95.2 fl (ref 78.0–100.0)
Monocytes Absolute: 0.3 10*3/uL (ref 0.1–1.0)
Neutrophils Relative %: 47.2 % (ref 43.0–77.0)
Platelets: 238 10*3/uL (ref 150.0–400.0)
RDW: 13.4 % (ref 11.5–14.6)

## 2012-04-16 LAB — BASIC METABOLIC PANEL
BUN: 16 mg/dL (ref 6–23)
CO2: 29 mEq/L (ref 19–32)
Chloride: 106 mEq/L (ref 96–112)
Creatinine, Ser: 0.8 mg/dL (ref 0.4–1.2)
Glucose, Bld: 90 mg/dL (ref 70–99)

## 2012-04-16 LAB — LDL CHOLESTEROL, DIRECT: Direct LDL: 193.9 mg/dL

## 2012-04-16 LAB — IBC PANEL
Iron: 69 ug/dL (ref 42–145)
Transferrin: 196.8 mg/dL — ABNORMAL LOW (ref 212.0–360.0)

## 2012-04-16 LAB — LIPID PANEL: Total CHOL/HDL Ratio: 5

## 2012-04-16 LAB — HEMOGLOBIN A1C: Hgb A1c MFr Bld: 5.5 % (ref 4.6–6.5)

## 2012-04-23 ENCOUNTER — Ambulatory Visit: Payer: PRIVATE HEALTH INSURANCE | Admitting: Internal Medicine

## 2012-04-23 ENCOUNTER — Ambulatory Visit (INDEPENDENT_AMBULATORY_CARE_PROVIDER_SITE_OTHER): Payer: Medicare Other | Admitting: Internal Medicine

## 2012-04-23 ENCOUNTER — Encounter: Payer: Self-pay | Admitting: Internal Medicine

## 2012-04-23 VITALS — BP 150/88 | HR 89 | Temp 98.6°F | Ht 60.25 in | Wt 124.0 lb

## 2012-04-23 DIAGNOSIS — M899 Disorder of bone, unspecified: Secondary | ICD-10-CM

## 2012-04-23 DIAGNOSIS — J479 Bronchiectasis, uncomplicated: Secondary | ICD-10-CM

## 2012-04-23 DIAGNOSIS — H9190 Unspecified hearing loss, unspecified ear: Secondary | ICD-10-CM

## 2012-04-23 DIAGNOSIS — E785 Hyperlipidemia, unspecified: Secondary | ICD-10-CM

## 2012-04-23 DIAGNOSIS — I10 Essential (primary) hypertension: Secondary | ICD-10-CM

## 2012-04-23 DIAGNOSIS — M949 Disorder of cartilage, unspecified: Secondary | ICD-10-CM

## 2012-04-23 DIAGNOSIS — G609 Hereditary and idiopathic neuropathy, unspecified: Secondary | ICD-10-CM

## 2012-04-23 DIAGNOSIS — K219 Gastro-esophageal reflux disease without esophagitis: Secondary | ICD-10-CM

## 2012-04-23 DIAGNOSIS — Z0000 Encounter for general adult medical examination without abnormal findings: Secondary | ICD-10-CM

## 2012-04-23 MED ORDER — LOSARTAN POTASSIUM 50 MG PO TABS: 50.0000 mg | ORAL_TABLET | Freq: Every day | ORAL | Status: DC

## 2012-04-23 NOTE — Progress Notes (Signed)
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 sigmoidoscopy or colonoscopy.** / Every 5 years for a flexible sigmoidoscopy or every 10 years for a colonoscopy beginning at age 51 and continuing until age 77.  Hepatitis C blood test.** / For all people born from [birth year] through 1965 and any individual with known risks for hepatitis C.  Osteoporosis screening.** / A one-time screening for women ages 44 and over and women at risk for fractures or osteoporosis.  Skin self-exam. / Monthly.  Influenza immunization.** / Every year.  Pneumococcal polysaccharide immunization.** / 1 dose at age 3 (or older) if you have never been vaccinated.  Tetanus, diphtheria, pertussis (Tdap, Td) immunization. / A one-time dose of Tdap vaccine if you are over 65 and have contact with an infant, are a Research scientist (physical sciences), or simply want to be protected from whooping cough. After that, you need a Td booster dose every 10 years.  Varicella immunization.** / Consult your caregiver.  Meningococcal immunization.** / Consult your caregiver.  Hepatitis A immunization.** / Consult your caregiver. 2  doses, 6 to 18 months apart.  Hepatitis B immunization.** / Check with your caregiver. 3 doses, usually over 6 months. ** Family history and personal history of risk and conditions may change your caregiver's recommendations. Document Released: 07/19/2001 Document Revised: 08/15/2011 Document Reviewed: 10/18/2010 [facility] Patient Information 2013 [place], [place].      Neta Mends. Panosh M.D. Reviewed record and in EHR since 2008 no other statin except crestor off and on   Will try pravachol  Low dose and increase as tolerated. reviewed record had dexa 2006  Osteopenia  Plan contact pt to get dexa done before next visit.   LUMBAR SPINE (L1-L4)  BMD: 1.104  T-score (% of young adult value): 0.5  Z-score (% adult age match value): 2.3  LEFT HIP (NECK)  BMD: 0.698  T-score (% of young adult value): -1.4  Z-score (% adult age  match value): 0.1

## 2012-04-23 NOTE — Patient Instructions (Signed)
Your BP remains elevated here  Consider to check    bp readings . Begin 25 mg  of losartan  .   Per day .  Plan labs and rov in 2-3 months.  I will review your record and  Make a decision about the lipid medication  To see if you have been on pravachol in the past a milder med that  May have less change of side effects .  Keep appt with GI about the probable GERD.   Preventive Care for Adults, Female A healthy lifestyle and preventive care can promote health and wellness. Preventive health guidelines for women include the following key practices.  A routine yearly physical is a good way to check with your caregiver about your health and preventive screening. It is a chance to share any concerns and updates on your health, and to receive a thorough exam.  Visit your dentist for a routine exam and preventive care every 6 months. Brush your teeth twice a day and floss once a day. Good oral hygiene prevents tooth decay and gum disease.  The frequency of eye exams is based on your age, health, family medical history, use of contact lenses, and other factors. Follow your caregiver's recommendations for frequency of eye exams.  Eat a healthy diet. Foods like vegetables, fruits, whole grains, low-fat dairy products, and lean protein foods contain the nutrients you need without too many calories. Decrease your intake of foods high in solid fats, added sugars, and salt. Eat the right amount of calories for you.Get information about a proper diet from your caregiver, if necessary.  Regular physical exercise is one of the most important things you can do for your health. Most adults should get at least 150 minutes of moderate-intensity exercise (any activity that increases your heart rate and causes you to sweat) each week. In addition, most adults need muscle-strengthening exercises on 2 or more days a week.  Maintain a healthy weight. The body mass index (BMI) is a screening tool to identify possible  weight problems. It provides an estimate of body fat based on height and weight. Your caregiver can help determine your BMI, and can help you achieve or maintain a healthy weight.For adults 20 years and older:  A BMI below 18.5 is considered underweight.  A BMI of 18.5 to 24.9 is normal.  A BMI of 25 to 29.9 is considered overweight.  A BMI of 30 and above is considered obese.  Maintain normal blood lipids and cholesterol levels by exercising and minimizing your intake of saturated fat. Eat a balanced diet with plenty of fruit and vegetables. Blood tests for lipids and cholesterol should begin at age 67 and be repeated every 5 years. If your lipid or cholesterol levels are high, you are over 50, or you are at high risk for heart disease, you may need your cholesterol levels checked more frequently.Ongoing high lipid and cholesterol levels should be treated with medicines if diet and exercise are not effective.  If you smoke, find out from your caregiver how to quit. If you do not use tobacco, do not start.  If you are pregnant, do not drink alcohol. If you are breastfeeding, be very cautious about drinking alcohol. If you are not pregnant and choose to drink alcohol, do not exceed 1 drink per day. One drink is considered to be 12 ounces (355 mL) of beer, 5 ounces (148 mL) of wine, or 1.5 ounces (44 mL) of liquor.  Avoid use of street  drugs. Do not share needles with anyone. Ask for help if you need support or instructions about stopping the use of drugs.  High blood pressure causes heart disease and increases the risk of stroke. Your blood pressure should be checked at least every 1 to 2 years. Ongoing high blood pressure should be treated with medicines if weight loss and exercise are not effective.  If you are 34 to 71 years old, ask your caregiver if you should take aspirin to prevent strokes.  Diabetes screening involves taking a blood sample to check your fasting blood sugar level. This  should be done once every 3 years, after age 1, if you are within normal weight and without risk factors for diabetes. Testing should be considered at a younger age or be carried out more frequently if you are overweight and have at least 1 risk factor for diabetes.  Breast cancer screening is essential preventive care for women. You should practice "breast self-awareness." This means understanding the normal appearance and feel of your breasts and may include breast self-examination. Any changes detected, no matter how small, should be reported to a caregiver. Women in their 77s and 30s should have a clinical breast exam (CBE) by a caregiver as part of a regular health exam every 1 to 3 years. After age 79, women should have a CBE every year. Starting at age 57, women should consider having a mammography (breast X-ray test) every year. Women who have a family history of breast cancer should talk to their caregiver about genetic screening. Women at a high risk of breast cancer should talk to their caregivers about having magnetic resonance imaging (MRI) and a mammography every year.  The Pap test is a screening test for cervical cancer. A Pap test can show cell changes on the cervix that might become cervical cancer if left untreated. A Pap test is a procedure in which cells are obtained and examined from the lower end of the uterus (cervix).  Women should have a Pap test starting at age 27.  Between ages 12 and 58, Pap tests should be repeated every 2 years.  Beginning at age 11, you should have a Pap test every 3 years as long as the past 3 Pap tests have been normal.  Some women have medical problems that increase the chance of getting cervical cancer. Talk to your caregiver about these problems. It is especially important to talk to your caregiver if a new problem develops soon after your last Pap test. In these cases, your caregiver may recommend more frequent screening and Pap tests.  The above  recommendations are the same for women who have or have not gotten the vaccine for human papillomavirus (HPV).  If you had a hysterectomy for a problem that was not cancer or a condition that could lead to cancer, then you no longer need Pap tests. Even if you no longer need a Pap test, a regular exam is a good idea to make sure no other problems are starting.  If you are between ages 25 and 8, and you have had normal Pap tests going back 10 years, you no longer need Pap tests. Even if you no longer need a Pap test, a regular exam is a good idea to make sure no other problems are starting.  If you have had past treatment for cervical cancer or a condition that could lead to cancer, you need Pap tests and screening for cancer for at least 20 years after your  treatment.  If Pap tests have been discontinued, risk factors (such as a new sexual partner) need to be reassessed to determine if screening should be resumed.  The HPV test is an additional test that may be used for cervical cancer screening. The HPV test looks for the virus that can cause the cell changes on the cervix. The cells collected during the Pap test can be tested for HPV. The HPV test could be used to screen women aged 86 years and older, and should be used in women of any age who have unclear Pap test results. After the age of 4, women should have HPV testing at the same frequency as a Pap test.  Colorectal cancer can be detected and often prevented. Most routine colorectal cancer screening begins at the age of 27 and continues through age 35. However, your caregiver may recommend screening at an earlier age if you have risk factors for colon cancer. On a yearly basis, your caregiver may provide home test kits to check for hidden blood in the stool. Use of a small camera at the end of a tube, to directly examine the colon (sigmoidoscopy or colonoscopy), can detect the earliest forms of colorectal cancer. Talk to your caregiver about  this at age 20, when routine screening begins. Direct examination of the colon should be repeated every 5 to 10 years through age 4, unless early forms of pre-cancerous polyps or small growths are found.  Hepatitis C blood testing is recommended for all people born from [birth year] through 1965 and any individual with known risks for hepatitis C.  Practice safe sex. Use condoms and avoid high-risk sexual practices to reduce the spread of sexually transmitted infections (STIs). STIs include gonorrhea, chlamydia, syphilis, trichomonas, herpes, HPV, and human immunodeficiency virus (HIV). Herpes, HIV, and HPV are viral illnesses that have no cure. They can result in disability, cancer, and death. Sexually active women aged 27 and younger should be checked for chlamydia. Older women with new or multiple partners should also be tested for chlamydia. Testing for other STIs is recommended if you are sexually active and at increased risk.  Osteoporosis is a disease in which the bones lose minerals and strength with aging. This can result in serious bone fractures. The risk of osteoporosis can be identified using a bone density scan. Women ages 55 and over and women at risk for fractures or osteoporosis should discuss screening with their caregivers. Ask your caregiver whether you should take a calcium supplement or vitamin D to reduce the rate of osteoporosis.  Menopause can be associated with physical symptoms and risks. Hormone replacement therapy is available to decrease symptoms and risks. You should talk to your caregiver about whether hormone replacement therapy is right for you.  Use sunscreen with sun protection factor (SPF) of 30 or more. Apply sunscreen liberally and repeatedly throughout the day. You should seek shade when your shadow is shorter than you. Protect yourself by wearing long sleeves, pants, a wide-brimmed hat, and sunglasses year round, whenever you are outdoors.  Once a month, do a whole body  skin exam, using a mirror to look at the skin on your back. Notify your caregiver of new moles, moles that have irregular borders, moles that are larger than a pencil eraser, or moles that have changed in shape or color.  Stay current with required immunizations.  Influenza. You need a dose every fall (or winter). The composition of the flu vaccine changes each year, so being vaccinated once is  not enough.  Pneumococcal polysaccharide. You need 1 to 2 doses if you smoke cigarettes or if you have certain chronic medical conditions. You need 1 dose at age 62 (or older) if you have never been vaccinated.  Tetanus, diphtheria, pertussis (Tdap, Td). Get 1 dose of Tdap vaccine if you are younger than age 81, are over 67 and have contact with an infant, are a Research scientist (physical sciences), are pregnant, or simply want to be protected from whooping cough. After that, you need a Td booster dose every 10 years. Consult your caregiver if you have not had at least 3 tetanus and diphtheria-containing shots sometime in your life or have a deep or dirty wound.  HPV. You need this vaccine if you are a woman age 54 or younger. The vaccine is given in 3 doses over 6 months.  Measles, mumps, rubella (MMR). You need at least 1 dose of MMR if you were born in 1957 or later. You may also need a second dose.  Meningococcal. If you are age 62 to 63 and a first-year college student living in a residence hall, or have one of several medical conditions, you need to get vaccinated against meningococcal disease. You may also need additional booster doses.  Zoster (shingles). If you are age 65 or older, you should get this vaccine.  Varicella (chickenpox). If you have never had chickenpox or you were vaccinated but received only 1 dose, talk to your caregiver to find out if you need this vaccine.  Hepatitis A. You need this vaccine if you have a specific risk factor for hepatitis A virus infection or you simply wish to be protected from  this disease. The vaccine is usually given as 2 doses, 6 to 18 months apart.  Hepatitis B. You need this vaccine if you have a specific risk factor for hepatitis B virus infection or you simply wish to be protected from this disease. The vaccine is given in 3 doses, usually over 6 months. Preventive Services / Frequency Ages 24 to 97  Blood pressure check.** / Every 1 to 2 years.  Lipid and cholesterol check.** / Every 5 years beginning at age 34.  Clinical breast exam.** / Every 3 years for women in their 62s and 30s.  Pap test.** / Every 2 years from ages 72 through 51. Every 3 years starting at age 40 through age 75 or 29 with a history of 3 consecutive normal Pap tests.  HPV screening.** / Every 3 years from ages 19 through ages 17 to 83 with a history of 3 consecutive normal Pap tests.  Hepatitis C blood test.** / For any individual with known risks for hepatitis C.  Skin self-exam. / Monthly.  Influenza immunization.** / Every year.  Pneumococcal polysaccharide immunization.** / 1 to 2 doses if you smoke cigarettes or if you have certain chronic medical conditions.  Tetanus, diphtheria, pertussis (Tdap, Td) immunization. / A one-time dose of Tdap vaccine. After that, you need a Td booster dose every 10 years.  HPV immunization. / 3 doses over 6 months, if you are 41 and younger.  Measles, mumps, rubella (MMR) immunization. / You need at least 1 dose of MMR if you were born in 1957 or later. You may also need a second dose.  Meningococcal immunization. / 1 dose if you are age 37 to 55 and a first-year college student living in a residence hall, or have one of several medical conditions, you need to get vaccinated against meningococcal disease. You may  also need additional booster doses.  Varicella immunization.** / Consult your caregiver.  Hepatitis A immunization.** / Consult your caregiver. 2 doses, 6 to 18 months apart.  Hepatitis B immunization.** / Consult your  caregiver. 3 doses usually over 6 months. Ages 24 to 34  Blood pressure check.** / Every 1 to 2 years.  Lipid and cholesterol check.** / Every 5 years beginning at age 79.  Clinical breast exam.** / Every year after age 3.  Mammogram.** / Every year beginning at age 57 and continuing for as long as you are in good health. Consult with your caregiver.  Pap test.** / Every 3 years starting at age 52 through age 63 or 5 with a history of 3 consecutive normal Pap tests.  HPV screening.** / Every 3 years from ages 77 through ages 76 to 13 with a history of 3 consecutive normal Pap tests.  Fecal occult blood test (FOBT) of stool. / Every year beginning at age 68 and continuing until age 9. You may not need to do this test if you get a colonoscopy every 10 years.  Flexible sigmoidoscopy or colonoscopy.** / Every 5 years for a flexible sigmoidoscopy or every 10 years for a colonoscopy beginning at age 34 and continuing until age 68.  Hepatitis C blood test.** / For all people born from [birth year] through 1965 and any individual with known risks for hepatitis C.  Skin self-exam. / Monthly.  Influenza immunization.** / Every year.  Pneumococcal polysaccharide immunization.** / 1 to 2 doses if you smoke cigarettes or if you have certain chronic medical conditions.  Tetanus, diphtheria, pertussis (Tdap, Td) immunization.** / A one-time dose of Tdap vaccine. After that, you need a Td booster dose every 10 years.  Measles, mumps, rubella (MMR) immunization. / You need at least 1 dose of MMR if you were born in 1957 or later. You may also need a second dose.  Varicella immunization.** / Consult your caregiver.  Meningococcal immunization.** / Consult your caregiver.  Hepatitis A immunization.** / Consult your caregiver. 2 doses, 6 to 18 months apart.  Hepatitis B immunization.** / Consult your caregiver. 3 doses, usually over 6 months. Ages 51 and over  Blood pressure check.** / Every 1 to 2  years.  Lipid and cholesterol check.** / Every 5 years beginning at age 24.  Clinical breast exam.** / Every year after age 26.  Mammogram.** / Every year beginning at age 60 and continuing for as long as you are in good health. Consult with your caregiver.  Pap test.** / Every 3 years starting at age 18 through age 76 or 47 with a 3 consecutive normal Pap tests. Testing can be stopped between 65 and 70 with 3 consecutive normal Pap tests and no abnormal Pap or HPV tests in the past 10 years.  HPV screening.** / Every 3 years from ages 58 through ages 10 or 38 with a history of 3 consecutive normal Pap tests. Testing can be stopped between 65 and 70 with 3 consecutive normal Pap tests and no abnormal Pap or HPV tests in the past 10 years.  Fecal occult blood test (FOBT) of stool. / Every year beginning at age 42 and continuing until age 15. You may not need to do this test if you get a colonoscopy every 10 years.  Flexible sigmoidoscopy or colonoscopy.** / Every 5 years for a flexible sigmoidoscopy or every 10 years for a colonoscopy beginning at age 32 and continuing until age 63.  Hepatitis C blood  test.** / For all people born from [birth year] through 1965 and any individual with known risks for hepatitis C.  Osteoporosis screening.** / A one-time screening for women ages 25 and over and women at risk for fractures or osteoporosis.  Skin self-exam. / Monthly.  Influenza immunization.** / Every year.  Pneumococcal polysaccharide immunization.** / 1 dose at age 31 (or older) if you have never been vaccinated.  Tetanus, diphtheria, pertussis (Tdap, Td) immunization. / A one-time dose of Tdap vaccine if you are over 65 and have contact with an infant, are a Research scientist (physical sciences), or simply want to be protected from whooping cough. After that, you need a Td booster dose every 10 years.  Varicella immunization.** / Consult your caregiver.  Meningococcal immunization.** / Consult your  caregiver.  Hepatitis A immunization.** / Consult your caregiver. 2 doses, 6 to 18 months apart.  Hepatitis B immunization.** / Check with your caregiver. 3 doses, usually over 6 months. ** Family history and personal history of risk and conditions may change your caregiver's recommendations. Document Released: 07/19/2001 Document Revised: 08/15/2011 Document Reviewed: 10/18/2010 [facility] Patient Information 2013 [place], [place].

## 2012-04-26 ENCOUNTER — Other Ambulatory Visit: Payer: Self-pay | Admitting: Family Medicine

## 2012-04-26 MED ORDER — PANTOPRAZOLE SODIUM 40 MG PO TBEC: 40.0000 mg | DELAYED_RELEASE_TABLET | Freq: Every day | ORAL | Status: DC

## 2012-04-29 ENCOUNTER — Encounter: Payer: Self-pay | Admitting: Internal Medicine

## 2012-04-29 MED ORDER — PRAVASTATIN SODIUM 20 MG PO TABS: 20.0000 mg | ORAL_TABLET | Freq: Every day | ORAL | Status: DC

## 2012-05-04 ENCOUNTER — Other Ambulatory Visit: Payer: Self-pay | Admitting: Family Medicine

## 2012-05-04 DIAGNOSIS — E785 Hyperlipidemia, unspecified: Secondary | ICD-10-CM

## 2012-05-04 DIAGNOSIS — M8580 Other specified disorders of bone density and structure, unspecified site: Secondary | ICD-10-CM

## 2012-05-15 ENCOUNTER — Encounter: Payer: Self-pay | Admitting: Internal Medicine

## 2012-05-15 ENCOUNTER — Ambulatory Visit (INDEPENDENT_AMBULATORY_CARE_PROVIDER_SITE_OTHER): Payer: Medicare Other | Admitting: Internal Medicine

## 2012-05-15 VITALS — BP 120/80 | HR 68 | Ht 62.5 in | Wt 126.0 lb

## 2012-05-15 DIAGNOSIS — K219 Gastro-esophageal reflux disease without esophagitis: Secondary | ICD-10-CM

## 2012-05-15 DIAGNOSIS — R05 Cough: Secondary | ICD-10-CM

## 2012-05-15 DIAGNOSIS — R1310 Dysphagia, unspecified: Secondary | ICD-10-CM

## 2012-05-15 DIAGNOSIS — K5900 Constipation, unspecified: Secondary | ICD-10-CM
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
## 2012-05-15 NOTE — Patient Instructions (Addendum)
You have been scheduled for an endoscopy with propofol. Please follow written instructions given to you at your visit today. If you use inhalers (even only as needed) or a CPAP machine, please bring them with you on the day of your procedure.  Use your milk of magnesium more regularly.  Thank you for choosing me and [facility].  Iva Boop, M.D., [facility]

## 2012-05-22 ENCOUNTER — Ambulatory Visit (AMBULATORY_SURGERY_CENTER): Payer: Medicare Other | Admitting: Internal Medicine

## 2012-05-22 ENCOUNTER — Encounter: Payer: Self-pay | Admitting: Internal Medicine

## 2012-05-22 ENCOUNTER — Telehealth: Payer: Self-pay | Admitting: Internal Medicine

## 2012-05-22 VITALS — BP 145/83 | HR 80 | Temp 97.8°F | Resp 11 | Ht 62.0 in | Wt 126.0 lb

## 2012-05-22 DIAGNOSIS — K219 Gastro-esophageal reflux disease without esophagitis: Secondary | ICD-10-CM

## 2012-05-22 DIAGNOSIS — R1310 Dysphagia, unspecified: Secondary | ICD-10-CM

## 2012-05-22 MED ORDER — SODIUM CHLORIDE 0.9 % IV SOLN: 500.0000 mL | INTRAVENOUS | Status: DC

## 2012-05-22 NOTE — Progress Notes (Signed)
No complaints noted in the recovery room. Maw  Patient did not experience any of the following events: a burn prior to discharge; a fall within the facility; wrong site/side/patient/procedure/implant event; or a hospital transfer or hospital admission upon discharge from the facility. (G8907) Patient did not have preoperative order for IV antibiotic SSI prophylaxis. (G8918)  

## 2012-05-22 NOTE — Progress Notes (Signed)
Propofol given over incremental dosages 

## 2012-05-22 NOTE — Patient Instructions (Addendum)
The esophagus was open but looks like it might not squeeze properly. i did dilate it to see if that will help you.  Try to be careful about bending over after you have eaten - that may be the best way to prevent the regurgitation that occurs then.  I think you can stop iron (ferrous fumarate). That may be upsetting stomach and your iron and hemoglobin are fine now.  Please read and follow the gas and flatulence prevention diet.  If you are not better in 1 month call back and tell my nurse Lavonna Rua) what your symptoms are and we will make new recommendations.  Thank you for choosing me and [facility].  Iva Boop, MD, [facility]   You may resume your current medications today.  Please call if any questions or concerns.  Handout was given on gas and flatulence prevention diet.   YOU HAD AN ENDOSCOPIC PROCEDURE TODAY AT THE [facility]: Refer to the procedure report that was given to you for any specific questions about what was found during the examination.  If the procedure report does not answer your questions, please call your gastroenterologist to clarify.  If you requested that your care partner not be given the details of your procedure findings, then the procedure report has been included in a sealed envelope for you to review at your convenience later.  YOU SHOULD EXPECT: Some feelings of bloating in the abdomen. Passage of more gas than usual.  Walking can help get rid of the air that was put into your GI tract during the procedure and reduce the bloating. If you had a lower endoscopy (such as a colonoscopy or flexible sigmoidoscopy) you may notice spotting of blood in your stool or on the toilet paper. If you underwent a bowel prep for your procedure, then you may not have a normal bowel movement for a few days.  DIET:   Drink plenty of fluids but you should avoid alcoholic beverages for 24 hours.  Please follow the dilatation diet the rest of the  day.   ACTIVITY: Your care partner should take you home directly after the procedure.  You should plan to take it easy, moving slowly for the rest of the day.  You can resume normal activity the day after the procedure however you should NOT DRIVE or use heavy machinery for 24 hours (because of the sedation medicines used during the test).    SYMPTOMS TO REPORT IMMEDIATELY: A gastroenterologist can be reached at any hour.  During normal business hours, 8:30 AM to 5:00 PM Monday through Friday, call [phone].  After hours and on weekends, please call the GI answering service at [phone] who will take a message and have the physician on call contact you.     Following upper endoscopy (EGD)  Vomiting of blood or coffee ground material  New chest pain or pain under the shoulder blades  Painful or persistently difficult swallowing  New shortness of breath  Fever of 100F or higher  Black, tarry-looking stools  FOLLOW UP: If any biopsies were taken you will be contacted by phone or by letter within the next 1-3 weeks.  Call your gastroenterologist if you have not heard about the biopsies in 3 weeks.  Our staff will call the home number listed on your records the next business day following your procedure to check on you and address any questions or concerns that you may have at that time regarding the information given to  you following your procedure. This is a courtesy call and so if there is no answer at the home number and we have not heard from you through the emergency physician on call, we will assume that you have returned to your regular daily activities without incident.  SIGNATURES/CONFIDENTIALITY: You and/or your care partner have signed paperwork which will be entered into your electronic medical record.  These signatures attest to the fact that that the information above on your After Visit Summary has been reviewed and is understood.  Full responsibility of the  confidentiality of this discharge information lies with you and/or your care-partner.

## 2012-05-22 NOTE — Op Note (Addendum)
[facility] [address] [place], [address]   ENDOSCOPY PROCEDURE REPORT  PATIENT: Julie, Bonilla  MR#: [id number] BIRTHDATE: 1940-07-09 , 71  yrs. old GENDER: Female ENDOSCOPIST: Iva Boop, MD, [facility] PROCEDURE DATE:  05/22/2012 PROCEDURE:  EGD, diagnostic and Maloney dilation of esophagus ASA CLASS:     Class III INDICATIONS:  History of esophageal reflux.   Dysphagia. MEDICATIONS: propofol (Diprivan) 50mg  IV, MAC sedation, administered by CRNA, and These medications were titrated to patient response per physician's verbal order TOPICAL ANESTHETIC: none  DESCRIPTION OF PROCEDURE: After the risks benefits and alternatives of the procedure were thoroughly explained, informed consent was obtained.  The LB GIF-H180 [id number] endoscope was introduced through the mouth and advanced to the second portion of the duodenum. Without limitations.  The instrument was slowly withdrawn as the mucosa was fully examined.      The upper, middle and distal third of the esophagus were carefully inspected and no abnormalities were noted.  The z-line was well seen at the GEJ.  The endoscope was pushed into the fundus which was normal including a retroflexed view.  The antrum, gastric body, first and second part of the duodenum were unremarkable. Retroflexed views revealed no abnormalities.     The scope was then withdrawn from the patient, a 53 [place] Maloney dilator was passed without difficulty or heme, and the procedure completed.  COMPLICATIONS: There were no complications. ENDOSCOPIC IMPRESSION: Normal EGD - 54 [place] Maloney dilation for dysphagia  RECOMMENDATIONS: 1.  Clear liquids until 530 (start at 430) , then soft foods rest of day.  Resume prior diet tomorrow. 2.   If not better in 1 month call back with update for my RN. 3.   Follow gas and flatulence prevention diet. 4.   May stop iron   eSigned:  Iva Boop, MD, [facility] 05/22/2012 3:48 PM Revised:  05/22/2012 3:48 PM  IO:NGEXB Lonie Peak, MD and The Patient

## 2012-05-23 ENCOUNTER — Telehealth: Payer: Self-pay
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
## 2012-05-25 ENCOUNTER — Telehealth: Payer: Self-pay | Admitting: Family Medicine

## 2012-05-25 NOTE — Telephone Encounter (Signed)
Pt is requesting refills sent to [facility].  Last filled on 11/04/11 #180 with 1 additional refill.  Last seen on 04/23/12 and has future appt on 07/24/12.  Please advise.  Thanks!!!

## 2012-05-27 NOTE — Telephone Encounter (Signed)
Ok to refill  X 2  

## 2012-05-28 ENCOUNTER — Other Ambulatory Visit: Payer: Self-pay | Admitting: Internal Medicine

## 2012-05-28 MED ORDER — CLONAZEPAM 0.5 MG PO TABS: ORAL_TABLET | ORAL | Status: DC

## 2012-05-28 NOTE — Telephone Encounter (Signed)
Called to the pharmacy and left on voicemail. 

## 2012-07-17 ENCOUNTER — Other Ambulatory Visit (INDEPENDENT_AMBULATORY_CARE_PROVIDER_SITE_OTHER): Payer: Medicare Other

## 2012-07-17 DIAGNOSIS — E785 Hyperlipidemia, unspecified: Secondary | ICD-10-CM

## 2012-07-17 LAB — LIPID PANEL
Cholesterol: 227 mg/dL — ABNORMAL HIGH (ref 0–200)
HDL: 60.5 mg/dL (ref >39.00)
Total CHOL/HDL Ratio: 4
Triglycerides: 124 mg/dL (ref 0.0–149.0)
VLDL: 24.8 mg/dL (ref 0.0–40.0)

## 2012-07-17 LAB — BASIC METABOLIC PANEL
CO2: 30 mEq/L (ref 19–32)
Calcium: 8.9 mg/dL (ref 8.4–10.5)
Creatinine, Ser: 0.8 mg/dL (ref 0.4–1.2)
GFR: 80.74 mL/min (ref >60.00)
Sodium: 139 mEq/L (ref 135–145)

## 2012-07-24 ENCOUNTER — Ambulatory Visit (INDEPENDENT_AMBULATORY_CARE_PROVIDER_SITE_OTHER): Payer: Medicare Other | Admitting: Internal Medicine

## 2012-07-24 ENCOUNTER — Encounter: Payer: Self-pay | Admitting: Internal Medicine

## 2012-07-24 VITALS — BP 130/80 | HR 68 | Temp 97.8°F | Wt 127.0 lb

## 2012-07-24 DIAGNOSIS — E785 Hyperlipidemia, unspecified: Secondary | ICD-10-CM

## 2012-07-24 DIAGNOSIS — G4762 Sleep related leg cramps: Secondary | ICD-10-CM

## 2012-07-24 DIAGNOSIS — G609 Hereditary and idiopathic neuropathy, unspecified: Secondary | ICD-10-CM

## 2012-07-24 DIAGNOSIS — I10 Essential (primary) hypertension: Secondary | ICD-10-CM

## 2012-07-24 DIAGNOSIS — K219 Gastro-esophageal reflux disease without esophagitis: Secondary | ICD-10-CM

## 2012-07-24 MED ORDER — PRAVASTATIN SODIUM 20 MG PO TABS: 20.0000 mg | ORAL_TABLET | Freq: Every day | ORAL | Status: DC

## 2012-07-24 MED ORDER — PANTOPRAZOLE SODIUM 40 MG PO TBEC: 40.0000 mg | DELAYED_RELEASE_TABLET | Freq: Every day | ORAL | Status: DC

## 2012-07-24 MED ORDER — CLONAZEPAM 0.5 MG PO TABS: ORAL_TABLET | ORAL | Status: DC

## 2012-07-24 MED ORDER — LOSARTAN POTASSIUM 50 MG PO TABS: 50.0000 mg | ORAL_TABLET | Freq: Every day | ORAL | Status: DC

## 2012-07-24 NOTE — Patient Instructions (Addendum)
Continue  pravachol   For now.  Lipids are much better .   Stay on the losartan    bp is good today .  ROV in about 5-6 months .
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
## 2012-07-26 ENCOUNTER — Ambulatory Visit (INDEPENDENT_AMBULATORY_CARE_PROVIDER_SITE_OTHER)
Admission: RE | Admit: 2012-07-26 | Discharge: 2012-07-26 | Disposition: A | Discharge status: Home / Self Care | Payer: Medicare Other | Source: Ambulatory Visit | Attending: Internal Medicine | Admitting: Internal Medicine

## 2012-08-13 ENCOUNTER — Encounter: Payer: Self-pay | Admitting: Internal Medicine

## 2012-08-14 ENCOUNTER — Other Ambulatory Visit: Payer: Self-pay | Admitting: Family Medicine

## 2012-08-14 MED ORDER — GABAPENTIN 300 MG PO CAPS: 600.0000 mg | ORAL_CAPSULE | Freq: Three times a day (TID) | ORAL | Status: DC

## 2012-08-20 ENCOUNTER — Telehealth: Payer: Self-pay | Admitting: Family Medicine

## 2012-08-20 NOTE — Telephone Encounter (Signed)
The patient called and left a message on my machine.  She stated she is having pain in her left leg/knee.  She is wondering if this could be due to new meds or if she should go to ortho.  She has stopped the protonix due to muscle pain with no relief.   Please advise.  Thanks!!

## 2012-08-20 NOTE — Telephone Encounter (Signed)
i dont think its the protoxix  ? Does she mean pravachol.  alos doubt this  Based on the message .  See ortho if a  Knee issue alone.

## 2012-08-21 NOTE — Telephone Encounter (Signed)
Spoke to the pt.  She did mean protonix.  Said she looked this up and it causes muscle and joint pain.  However, advised her that Dr. Fabian Sharp does not think it is the medication.  Instructed her to call Dr. Simonne Come at Ortho and make an appt.  Also instructed her to continue with all her medications.

## 2012-09-11 ENCOUNTER — Other Ambulatory Visit: Payer: Self-pay | Admitting: Orthopedic Surgery

## 2012-09-16 ENCOUNTER — Other Ambulatory Visit: Payer: Self-pay | Admitting: Internal Medicine

## 2012-09-17 ENCOUNTER — Other Ambulatory Visit: Payer: Self-pay | Admitting: Family Medicine

## 2012-09-17 MED ORDER — DULOXETINE HCL 60 MG PO CPEP: ORAL_CAPSULE | ORAL | Status: DC

## 2012-11-02 ENCOUNTER — Encounter (HOSPITAL_BASED_OUTPATIENT_CLINIC_OR_DEPARTMENT_OTHER): Admission: RE | Payer: Self-pay | Source: Ambulatory Visit

## 2012-11-02 ENCOUNTER — Ambulatory Visit (HOSPITAL_BASED_OUTPATIENT_CLINIC_OR_DEPARTMENT_OTHER): Admission: RE | Admit: 2012-11-02 | Payer: Medicare Other | Source: Ambulatory Visit | Admitting: Orthopedic Surgery

## 2012-11-02 SURGERY — ARTHROSCOPY, KNEE, WITH MEDIAL MENISCECTOMY
Anesthesia: General | Site: Knee | Laterality: Right

## 2012-11-15 ENCOUNTER — Other Ambulatory Visit: Payer: Self-pay | Admitting: Internal Medicine

## 2012-12-12 ENCOUNTER — Other Ambulatory Visit: Payer: Self-pay | Admitting: Family Medicine

## 2012-12-12 MED ORDER — DULOXETINE HCL 60 MG PO CPEP: ORAL_CAPSULE | ORAL | Status: DC

## 2013-01-12 ENCOUNTER — Other Ambulatory Visit: Payer: Self-pay | Admitting: Orthopedic Surgery

## 2013-01-12 NOTE — Progress Notes (Signed)
Preoperative surgical orders have been place into the Epic hospital system for Julie Bonilla on 01/12/2013, 10:16 AM  by Patrica Duel for surgery on 01/30/13.  Preop Knee Scope orders including IV Tylenol and IV Decadron as long as there are no contraindications to the above medications. Julie Peace, PA-C

## 2013-01-21 ENCOUNTER — Ambulatory Visit: Payer: Medicare Other | Admitting: Internal Medicine

## 2013-01-22 ENCOUNTER — Encounter (HOSPITAL_COMMUNITY): Payer: Self-pay | Admitting: Pharmacy Technician

## 2013-01-28 ENCOUNTER — Ambulatory Visit (INDEPENDENT_AMBULATORY_CARE_PROVIDER_SITE_OTHER): Payer: Medicare Other | Admitting: Internal Medicine

## 2013-01-28 ENCOUNTER — Encounter: Payer: Self-pay | Admitting: Internal Medicine

## 2013-01-28 ENCOUNTER — Encounter (HOSPITAL_COMMUNITY): Payer: Self-pay

## 2013-01-28 ENCOUNTER — Encounter (HOSPITAL_COMMUNITY)
Admission: RE | Admit: 2013-01-28 | Discharge: 2013-01-28 | Disposition: A | Discharge status: Home / Self Care | Payer: Medicare Other | Source: Ambulatory Visit | Attending: Orthopedic Surgery | Admitting: Orthopedic Surgery

## 2013-01-28 VITALS — BP 134/70 | HR 79 | Temp 97.9°F | Wt 126.0 lb

## 2013-01-28 DIAGNOSIS — M25561 Pain in right knee: Secondary | ICD-10-CM

## 2013-01-28 DIAGNOSIS — G609 Hereditary and idiopathic neuropathy, unspecified: Secondary | ICD-10-CM

## 2013-01-28 DIAGNOSIS — M25569 Pain in unspecified knee: Secondary | ICD-10-CM

## 2013-01-28 DIAGNOSIS — E785 Hyperlipidemia, unspecified: Secondary | ICD-10-CM

## 2013-01-28 LAB — BASIC METABOLIC PANEL
Calcium: 9.1 mg/dL (ref 8.4–10.5)
GFR calc non Af Amer: 84 mL/min — ABNORMAL LOW (ref >90)
Glucose, Bld: 96 mg/dL (ref 70–99)
Sodium: 139 mEq/L (ref 135–145)

## 2013-01-28 LAB — CBC
HCT: 43.3 % (ref 36.0–46.0)
Hemoglobin: 14.4 g/dL (ref 12.0–15.0)
MCH: 31.2 pg (ref 26.0–34.0)
MCHC: 33.3 g/dL (ref 30.0–36.0)
MCV: 93.7 fL (ref 78.0–100.0)
RBC: 4.62 MIL/uL (ref 3.87–5.11)
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
## 2013-01-28 NOTE — Patient Instructions (Signed)
bp is ok on repeat. Uncertain  If supplements can help . Cant guarantee.    Purity  In the bottle  But ok to try.  Lab Results  Component Value Date   CHOL 227* 07/17/2012   HDL 60.50 07/17/2012   LDLCALC 101* 02/16/2009   LDLDIRECT 129.1 07/17/2012   TRIG 124.0 07/17/2012   CHOLHDL 4 07/17/2012   wellness and full set of labs in February .

## 2013-01-28 NOTE — Pre-Procedure Instructions (Signed)
01-28-13 EKG done. CXR 9'13-Epic

## 2013-01-28 NOTE — Patient Instructions (Signed)
20 Julie Bonilla  01/28/2013   Your procedure is scheduled on:  8-27 -2014  Report to [facility] at      0900  AM.  Call this number if you have problems the morning of surgery: [phone]  Or Presurgical Testing [phone](Crescencio Jozwiak)      Do not eat food:After Midnight.    Take these medicines the morning of surgery with A SIP OF WATER: Duloxetine. Neurontin. Pantoprazole.   Do not wear jewelry, make-up or nail polish.  Do not wear lotions, powders, or perfumes. You may wear deodorant.  Do not shave 12 hours prior to first CHG shower(legs and under arms).(face and neck okay.)  Do not bring valuables to the hospital.  Contacts, dentures or bridgework,body piercing,  may not be worn into surgery.  Leave suitcase in the car. After surgery it may be brought to your room.  For patients admitted to the hospital, checkout time is 11:00 AM the day of discharge.   Patients discharged the day of surgery will not be allowed to drive home. Must have responsible person with you x 24 hours once discharged.  Name and phone number of your driver:Daniel Higashi. Spouse [phone] pt's cell/ [phone] h   Special Instructions: CHG(Chlorhedine 4%-"Hibiclens","Betasept","Aplicare") Shower Use Special Wash: see special instructions.(avoid face and genitals)   Please read over the following fact sheets that you were given: MRSA Information.    Failure to follow these instructions may result in Cancellation of your surgery.   Patient signature_______________________________________________________

## 2013-01-29 DIAGNOSIS — S83249A Other tear of medial meniscus, current injury, unspecified knee, initial encounter: Secondary | ICD-10-CM
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
## 2013-01-30 ENCOUNTER — Ambulatory Visit (HOSPITAL_COMMUNITY)
Admission: RE | Admit: 2013-01-30 | Discharge: 2013-01-30 | Disposition: A | Discharge status: Home / Self Care | Payer: Medicare Other | Source: Ambulatory Visit | Attending: Orthopedic Surgery | Admitting: Orthopedic Surgery

## 2013-01-30 ENCOUNTER — Encounter (HOSPITAL_COMMUNITY)
Admission: RE | Disposition: A | Discharge status: Home / Self Care | Payer: Self-pay | Source: Ambulatory Visit | Attending: Orthopedic Surgery

## 2013-01-30 ENCOUNTER — Encounter (HOSPITAL_COMMUNITY): Payer: Self-pay | Admitting: *Deleted

## 2013-01-30 ENCOUNTER — Encounter (HOSPITAL_COMMUNITY): Payer: Self-pay | Admitting: Anesthesiology

## 2013-01-30 ENCOUNTER — Ambulatory Visit (HOSPITAL_COMMUNITY): Payer: Medicare Other | Admitting: Anesthesiology

## 2013-01-30 DIAGNOSIS — M899 Disorder of bone, unspecified: Secondary | ICD-10-CM | POA: Insufficient documentation

## 2013-01-30 DIAGNOSIS — M23359 Other meniscus derangements, posterior horn of lateral meniscus, unspecified knee: Secondary | ICD-10-CM | POA: Insufficient documentation

## 2013-01-30 DIAGNOSIS — Z01812 Encounter for preprocedural laboratory examination: Secondary | ICD-10-CM | POA: Insufficient documentation

## 2013-01-30 DIAGNOSIS — S83249A Other tear of medial meniscus, current injury, unspecified knee, initial encounter: Secondary | ICD-10-CM

## 2013-01-30 DIAGNOSIS — I10 Essential (primary) hypertension: Secondary | ICD-10-CM | POA: Insufficient documentation

## 2013-01-30 DIAGNOSIS — Z01810 Encounter for preprocedural cardiovascular examination: Secondary | ICD-10-CM | POA: Insufficient documentation

## 2013-01-30 DIAGNOSIS — M23329 Other meniscus derangements, posterior horn of medial meniscus, unspecified knee: Secondary | ICD-10-CM | POA: Insufficient documentation

## 2013-01-30 DIAGNOSIS — M2240 Chondromalacia patellae, unspecified knee: Secondary | ICD-10-CM | POA: Insufficient documentation

## 2013-01-30 DIAGNOSIS — Z79899 Other long term (current) drug therapy: Secondary | ICD-10-CM | POA: Insufficient documentation

## 2013-01-30 DIAGNOSIS — Z7982 Long term (current) use of aspirin: Secondary | ICD-10-CM | POA: Insufficient documentation

## 2013-01-30 DIAGNOSIS — K219 Gastro-esophageal reflux disease without esophagitis: Secondary | ICD-10-CM | POA: Insufficient documentation

## 2013-01-30 DIAGNOSIS — S83241A Other tear of medial meniscus, current injury, right knee, initial encounter: Secondary | ICD-10-CM

## 2013-01-30 DIAGNOSIS — E785 Hyperlipidemia, unspecified: Secondary | ICD-10-CM | POA: Insufficient documentation

## 2013-01-30 HISTORY — PX: KNEE ARTHROSCOPY: SHX127

## 2013-01-30 SURGERY — ARTHROSCOPY, KNEE
Anesthesia: General | Site: Knee | Laterality: Right | Wound class: Clean

## 2013-01-30 MED ORDER — KETOROLAC TROMETHAMINE 30 MG/ML IJ SOLN
INTRAMUSCULAR | Status: DC | PRN
  Administered 2013-01-30: 30 mg via INTRAVENOUS

## 2013-01-30 MED ORDER — SODIUM CHLORIDE 0.9 % IV SOLN: INTRAVENOUS | Status: DC

## 2013-01-30 MED ORDER — FENTANYL CITRATE 0.05 MG/ML IJ SOLN
25.0000 ug | INTRAMUSCULAR | Status: DC | PRN
  Administered 2013-01-30: 50 ug via INTRAVENOUS

## 2013-01-30 MED ORDER — CHLORHEXIDINE GLUCONATE 4 % EX LIQD
60.0000 mL | Freq: Once | CUTANEOUS | Status: DC
  Filled 2013-01-30: qty 60

## 2013-01-30 MED ORDER — METHOCARBAMOL 500 MG PO TABS: 500.0000 mg | ORAL_TABLET | Freq: Four times a day (QID) | ORAL | Status: DC

## 2013-01-30 MED ORDER — MIDAZOLAM HCL 5 MG/5ML IJ SOLN
INTRAMUSCULAR | Status: DC | PRN
  Administered 2013-01-30: 2 mg via INTRAVENOUS

## 2013-01-30 MED ORDER — FENTANYL CITRATE 0.05 MG/ML IJ SOLN
INTRAMUSCULAR | Status: AC
  Filled 2013-01-30: qty 2

## 2013-01-30 MED ORDER — CEFAZOLIN SODIUM-DEXTROSE 2-3 GM-% IV SOLR
2.0000 g | INTRAVENOUS | Status: AC
  Administered 2013-01-30: 2 g via INTRAVENOUS

## 2013-01-30 MED ORDER — LOSARTAN POTASSIUM 50 MG PO TABS
50.0000 mg | ORAL_TABLET | Freq: Every day | ORAL | Status: AC
  Administered 2013-01-30: 50 mg via ORAL
  Filled 2013-01-30: qty 1

## 2013-01-30 MED ORDER — LACTATED RINGERS IV SOLN
INTRAVENOUS | Status: DC | PRN
  Administered 2013-01-30: 10:00:00 via INTRAVENOUS

## 2013-01-30 MED ORDER — PROMETHAZINE HCL 25 MG/ML IJ SOLN: 6.2500 mg | INTRAMUSCULAR | Status: DC | PRN

## 2013-01-30 MED ORDER — CEFAZOLIN SODIUM-DEXTROSE 2-3 GM-% IV SOLR
INTRAVENOUS | Status: AC
  Filled 2013-01-30: qty 50

## 2013-01-30 MED ORDER — BUPIVACAINE-EPINEPHRINE PF 0.25-1:200000 % IJ SOLN
INTRAMUSCULAR | Status: AC
  Filled 2013-01-30: qty 30

## 2013-01-30 MED ORDER — BUPIVACAINE-EPINEPHRINE 0.25% -1:200000 IJ SOLN
INTRAMUSCULAR | Status: DC | PRN
  Administered 2013-01-30: 20 mL

## 2013-01-30 MED ORDER — LIDOCAINE HCL (CARDIAC) 20 MG/ML IV SOLN
INTRAVENOUS | Status: DC | PRN
  Administered 2013-01-30: 50 mg via INTRAVENOUS

## 2013-01-30 MED ORDER — DEXAMETHASONE SODIUM PHOSPHATE 10 MG/ML IJ SOLN
10.0000 mg | Freq: Once | INTRAMUSCULAR | Status: AC
  Administered 2013-01-30: 10 mg via INTRAVENOUS

## 2013-01-30 MED ORDER — KETOROLAC TROMETHAMINE 30 MG/ML IJ SOLN: 15.0000 mg | Freq: Once | INTRAMUSCULAR | Status: DC | PRN

## 2013-01-30 MED ORDER — PROPOFOL 10 MG/ML IV BOLUS
INTRAVENOUS | Status: DC | PRN
  Administered 2013-01-30: 150 mg via INTRAVENOUS

## 2013-01-30 MED ORDER — FENTANYL CITRATE 0.05 MG/ML IJ SOLN
INTRAMUSCULAR | Status: DC | PRN
  Administered 2013-01-30: 25 ug via INTRAVENOUS

## 2013-01-30 MED ORDER — OXYCODONE HCL 5 MG PO TABS
5.0000 mg | ORAL_TABLET | ORAL | Status: DC | PRN
Start: 2013-01-30 — End: 2013-07-15

## 2013-01-30 MED ORDER — ACETAMINOPHEN 10 MG/ML IV SOLN
1000.0000 mg | Freq: Once | INTRAVENOUS | Status: AC
  Administered 2013-01-30: 1000 mg via INTRAVENOUS
  Filled 2013-01-30: qty 100

## 2013-01-30 MED ORDER — ONDANSETRON HCL 4 MG/2ML IJ SOLN
INTRAMUSCULAR | Status: DC | PRN
  Administered 2013-01-30: 4 mg via INTRAVENOUS

## 2013-01-30 MED ORDER — LACTATED RINGERS IR SOLN
Status: DC | PRN
  Administered 2013-01-30: 12000 mL

## 2013-01-30 SURGICAL SUPPLY — 29 items
BANDAGE ELASTIC 6 VELCRO ST LF (GAUZE/BANDAGES/DRESSINGS) ×2 IMPLANT
BLADE 4.2CUDA (BLADE) ×2 IMPLANT
CLOTH BEACON ORANGE TIMEOUT ST (SAFETY) ×2 IMPLANT
CUFF TOURN SGL QUICK 34 (TOURNIQUET CUFF) ×1
CUFF TRNQT CYL 34X4X40X1 (TOURNIQUET CUFF) ×1 IMPLANT
DRAPE U-SHAPE 47X51 STRL (DRAPES) ×2 IMPLANT
DRSG EMULSION OIL 3X3 NADH (GAUZE/BANDAGES/DRESSINGS) ×2 IMPLANT
DURAPREP 26ML APPLICATOR (WOUND CARE) ×2 IMPLANT
GLOVE BIO SURGEON STRL SZ8 (GLOVE) ×2 IMPLANT
GLOVE BIOGEL PI IND STRL 6.5 (GLOVE) ×1 IMPLANT
GLOVE BIOGEL PI IND STRL 7.5 (GLOVE) ×1 IMPLANT
GLOVE BIOGEL PI IND STRL 8 (GLOVE) ×1 IMPLANT
GLOVE BIOGEL PI INDICATOR 6.5 (GLOVE) ×1
GLOVE BIOGEL PI INDICATOR 7.5 (GLOVE) ×1
GLOVE BIOGEL PI INDICATOR 8 (GLOVE) ×1
GLOVE SURG SS PI 7.0 STRL IVOR (GLOVE) ×2 IMPLANT
GOWN STRL NON-REIN LRG LVL3 (GOWN DISPOSABLE) ×4 IMPLANT
MANIFOLD NEPTUNE II (INSTRUMENTS) ×4 IMPLANT
PACK ARTHROSCOPY WL (CUSTOM PROCEDURE TRAY) ×2 IMPLANT
PACK ICE MAXI GEL EZY WRAP (MISCELLANEOUS) ×6 IMPLANT
PADDING CAST COTTON 6X4 STRL (CAST SUPPLIES) ×2 IMPLANT
POSITIONER SURGICAL ARM (MISCELLANEOUS) ×2 IMPLANT
SET ARTHROSCOPY TUBING (MISCELLANEOUS) ×1
SET ARTHROSCOPY TUBING LN (MISCELLANEOUS) ×1 IMPLANT
SPONGE GAUZE 4X4 12PLY (GAUZE/BANDAGES/DRESSINGS) ×2 IMPLANT
SUT ETHILON 4 0 PS 2 18 (SUTURE) ×2 IMPLANT
TOWEL OR 17X26 10 PK STRL BLUE (TOWEL DISPOSABLE) ×2 IMPLANT
WAND 90 DEG TURBOVAC W/CORD (SURGICAL WAND) ×2 IMPLANT
WRAP KNEE MAXI GEL POST OP (GAUZE/BANDAGES/DRESSINGS) ×4 IMPLANT

## 2013-01-30 NOTE — Transfer of Care (Signed)
Immediate Anesthesia Transfer of Care Note  Patient: Julie Bonilla  Procedure(s) Performed: Procedure(s): RIGHT KNEE ARTHROSCOPY WITH MEDIAL AND LATERAL MENISCUS DEBRIDEMENT AND CONDROPLASTY   (Right)  Patient Location: PACU  Anesthesia Type:General  Level of Consciousness: awake and alert   Airway & Oxygen Therapy: Patient Spontanous Breathing and Patient connected to face mask oxygen  Post-op Assessment: Report given to PACU RN and Post -op Vital signs reviewed and stable  Post vital signs: Reviewed and stable  Complications: No apparent anesthesia complications

## 2013-01-30 NOTE — Op Note (Signed)
Preoperative diagnosis-  Right knee medial and lateral meniscal tears  Postoperative diagnosis Right- knee medial and lateral meniscal tears   plus Right medial femoral chondral defect  Procedure- Right knee arthroscopy with medial and lateral  Meniscal debridement and chondroplasty  Surgeon- Gus Rankin. Trinika Cortese, MD  Anesthesia-General  EBL-  minimal Complications- None  Condition- PACU - hemodynamically stable.  Brief clinical note- -Julie Bonilla is a 72 y.o.  female with a several month history of right knee pain and mechanical symptoms. Exam and history suggested medial meniscal tear confirmed by MRI. The patient presents now for arthroscopy and debridement   Procedure in detail -       After successful administration of General anesthetic, a tourmiquet is placed high on the Right  thigh and the Right lower extremity is prepped and draped in the usual sterile fashion. Time out is performed by the surgical team. Standard superomedial and inferolateral portal sites are marked and incisions made with an 11 blade. The inflow cannula is passed through the superomedial portal and camera through the inferolateral portal and inflow is initiated. Arthroscopic visualization proceeds.      The undersurface of the patella and trochlea are visualized and there is Grade II and II chondromalacia but no evidence of any unstable cartilage defects. The medial and lateral gutters are visualized and there are  no loose bodies. Flexion and valgus force is applied to the knee and the medial compartment is entered. A spinal needle is passed into the joint through the site marked for the inferomedial portal. A small incision is made and the dilator passed into the joint. The findings for the medial compartment are medial meniscal tear body and posterior horn which are unstable and unstable chondral defect 2 x 2 cm medial femoral condyle . The tear is debrided to a stable base with baskets and a shaver and sealed off  with the Arthrocare. The shaver is used to debride the unstable cartilage to a stable cartilaginous base with stable edges. It is probed and found to be stable.    The intercondylar notch is visualized and the ACL appears normal. The lateral compartment is entered and the findings are tear in body and posterior horn of lateral meniscus with area of exposed bone lateral femoral condyle and lateral tibial plateau . The tear is debrided to a stable base with baskets and a shaver and sealed off with the Arthrocare.      The joint is again inspected and there are no other tears, defects or loose bodies identified. The arthroscopic equipment is then removed from the inferior portals which are closed with interrupted 4-0 nylon. 20 ml of .25% Marcaine with epinephrine are injected through the inflow cannula and the cannula is then removed and the portal closed with nylon. The incisions are cleaned and dried and a bulky sterile dressing is applied. The patient is then awakened and transported to recovery in stable condition.   01/30/2013, 12:05 PM

## 2013-01-30 NOTE — Anesthesia Preprocedure Evaluation (Signed)
Anesthesia Evaluation  Patient identified by MRN, date of birth, ID band Patient awake    Reviewed: Allergy & Precautions, H&P , NPO status , Patient's Chart, lab work & pertinent test results  Airway Mallampati: II TM Distance: >3 FB Neck ROM: Full    Dental no notable dental hx.    Pulmonary neg pulmonary ROS,  breath sounds clear to auscultation  Pulmonary exam normal       Cardiovascular hypertension, Pt. on medications Rhythm:Regular Rate:Normal     Neuro/Psych negative neurological ROS  negative psych ROS   GI/Hepatic negative GI ROS, Neg liver ROS,   Endo/Other  negative endocrine ROS  Renal/GU negative Renal ROS  negative genitourinary   Musculoskeletal negative musculoskeletal ROS (+)   Abdominal   Peds negative pediatric ROS (+)  Hematology negative hematology ROS (+)   Anesthesia Other Findings   Reproductive/Obstetrics negative OB ROS                           Anesthesia Physical Anesthesia Plan  ASA: II  Anesthesia Plan: General   Post-op Pain Management:    Induction: Intravenous  Airway Management Planned: LMA  Additional Equipment:   Intra-op Plan:   Post-operative Plan:   Informed Consent: I have reviewed the patients History and Physical, chart, labs and discussed the procedure including the risks, benefits and alternatives for the proposed anesthesia with the patient or authorized representative who has indicated his/her understanding and acceptance.   Dental advisory given  Plan Discussed with: CRNA and Surgeon  Anesthesia Plan Comments:         Anesthesia Quick Evaluation  

## 2013-01-30 NOTE — Progress Notes (Signed)
Dr. Okey Dupre notified of pt's elevated blood pressure; order rec'd and will give med as ordered; continue to observe

## 2013-01-30 NOTE — Progress Notes (Signed)
Ambulated to bathroom w walker.. Tolerated well

## 2013-01-30 NOTE — Anesthesia Postprocedure Evaluation (Signed)
  Anesthesia Post-op Note  Patient: Julie Bonilla  Procedure(s) Performed: Procedure(s) (LRB): RIGHT KNEE ARTHROSCOPY WITH MEDIAL AND LATERAL MENISCUS DEBRIDEMENT AND CONDROPLASTY   (Right)  Patient Location: PACU  Anesthesia Type: General  Level of Consciousness: awake and alert   Airway and Oxygen Therapy: Patient Spontanous Breathing  Post-op Pain: mild  Post-op Assessment: Post-op Vital signs reviewed, Patient's Cardiovascular Status Stable, Respiratory Function Stable, Patent Airway and No signs of Nausea or vomiting  Last Vitals:  Filed Vitals:   01/30/13 0909  BP: 141/68  Pulse: 68  Temp: 36.4 C  Resp: 18    Post-op Vital Signs: stable   Complications: No apparent anesthesia complications

## 2013-01-30 NOTE — Interval H&P Note (Signed)
History and Physical Interval Note:  01/30/2013 11:10 AM  Julie Bonilla  has presented today for surgery, with the diagnosis of Medial and Lateral Menical Tear with Loose Bodies of the Right Knee  The various methods of treatment have been discussed with the patient and family. After consideration of risks, benefits and other options for treatment, the patient has consented to  Procedure(s): RIGHT KNEEARTHROSCOPY WITH DEBRIDEMENT AND CONDROPLASTY (Right) as a surgical intervention .  The patient's history has been reviewed, patient examined, no change in status, stable for surgery.  I have reviewed the patient's chart and labs.  Questions were answered to the patient's satisfaction.     Loanne Drilling

## 2013-01-31 ENCOUNTER — Encounter (HOSPITAL_COMMUNITY): Payer: Self-pay | Admitting: Orthopedic Surgery

## 2013-01-31 ENCOUNTER — Other Ambulatory Visit: Payer: Self-pay | Admitting: *Deleted

## 2013-01-31 MED ORDER — CLONAZEPAM 0.5 MG PO TABS: 0.5000 mg | ORAL_TABLET | Freq: Every day | ORAL | Status: DC

## 2013-02-18 ENCOUNTER — Other Ambulatory Visit: Payer: Self-pay | Admitting: Internal Medicine

## 2013-02-19 ENCOUNTER — Telehealth: Payer: Self-pay | Admitting: Internal Medicine

## 2013-02-19 ENCOUNTER — Other Ambulatory Visit: Payer: Self-pay

## 2013-02-19 DIAGNOSIS — Z1231 Encounter for screening mammogram for malignant neoplasm of breast: Secondary | ICD-10-CM

## 2013-02-19 NOTE — Telephone Encounter (Signed)
Katie called from [facility] and stated that the "hard copy" of the pt's gabapentin (NEURONTIN) 300 MG capsule has been lost. She would like a nurse to call her with a verbal order. Please assist. Thank you!

## 2013-02-20 ENCOUNTER — Other Ambulatory Visit: Payer: Self-pay | Admitting: Family Medicine

## 2013-02-20 NOTE — Telephone Encounter (Signed)
Called and left new rx on voicemail.

## 2013-04-10 ENCOUNTER — Ambulatory Visit: Payer: Medicare Other

## 2013-04-18 ENCOUNTER — Ambulatory Visit (INDEPENDENT_AMBULATORY_CARE_PROVIDER_SITE_OTHER): Payer: Medicare Other

## 2013-04-18 DIAGNOSIS — Z23 Encounter for immunization: Secondary | ICD-10-CM

## 2013-04-29 ENCOUNTER — Encounter: Payer: Self-pay | Admitting: Internal Medicine

## 2013-05-21 ENCOUNTER — Other Ambulatory Visit: Payer: Self-pay | Admitting: Internal Medicine

## 2013-06-04 ENCOUNTER — Telehealth: Payer: Self-pay | Admitting: Family Medicine

## 2013-06-04 NOTE — Telephone Encounter (Signed)
Call patient and ask about this how she  Is to use this  We havent rx ed this  For over a year.

## 2013-06-04 NOTE — Telephone Encounter (Signed)
Pt is requesting a refill.  Doesn't look like this has been filled since June 2013.  Please advise.  Thanks!

## 2013-06-05 ENCOUNTER — Other Ambulatory Visit: Payer: Self-pay | Admitting: Family Medicine

## 2013-06-05 MED ORDER — MELOXICAM 7.5 MG PO TABS: 7.5000 mg | ORAL_TABLET | Freq: Every day | ORAL | Status: DC | PRN

## 2013-06-05 NOTE — Telephone Encounter (Signed)
I spoke to the pt.  She stated the medication is for her knee pain.  She had surgery on Jan 30, 2013.  Pt stated that Dr. Despina Hick informed her that she has more arthritis in her knees.  She said she takes one tablet daily when needed.  Does not use everyday.  She said she was in so much pain last night that she took one of her husband's Mobic.  Per WP, okay to dispense #30 with 2 refills.  Per pt, sent to [facility] on [address].  Pt notified by telephone.

## 2013-06-11 ENCOUNTER — Telehealth: Payer: Self-pay | Admitting: Family Medicine

## 2013-06-11 NOTE — Telephone Encounter (Signed)
Spoke to the pt.  The generic Alka Seltzer she is taking has 325mg  of ASA in it.  She is currently taking 81mg  of ASA daily.  She is worried about taking too much.  She has a sore throat and runny nose.  Has some Demps discharge in her rt side nostril.  Started on Sunday.  Denies fever.  She has also tried Mucinex and Benadryl.  Instructed the pt not to take any more Alka Seltzer until notified.  Please advise.  Thanks!

## 2013-06-11 NOTE — Telephone Encounter (Signed)
She is not overdosing on asa but would advise against  multi ingredient cold meds to avoid a problem   Saline washes  If sx are not beginning to improve  in another week or localized pain or fever then plan Ov . Most sinus infections resolve on their own in about 7-10 days

## 2013-06-11 NOTE — Telephone Encounter (Signed)
Patient called and left a message on my machine.  She wants to know if OTC Alka Seltzer has ASA in it.  I checked on the [employer] page (.http://www.alkaseltzerplus.com/faq/)  There was a listing of the different types of this medication that does contain ASA.  Which Alka-Seltzer Plus formulas contain aspirin?  Cold Sparkling Original Cold Orange Zest Cold Cherry Burst Night Cold Formula Cold and Cough Formula Daytime Cold Nighttime Cold   The patient also complained of cold sx while leaving a message and wanted to know if she needs an antibiotic.  Tried returning the patient's call.  Left a message on her home machine.

## 2013-06-12 NOTE — Telephone Encounter (Signed)
Patient notified of all by telephone.  Instructed to make an appt if not better in one week.

## 2013-06-24 ENCOUNTER — Other Ambulatory Visit: Payer: Self-pay | Admitting: Internal Medicine

## 2013-06-25 NOTE — Telephone Encounter (Signed)
Last filled on 02/18/13 #540 with 0 additional refills Has a future CPE scheduled on 07/15/2013 Last seen on 01/28/13 Please advise. Thanks!

## 2013-06-27 NOTE — Telephone Encounter (Signed)
Ok to refill x 1  

## 2013-07-03 ENCOUNTER — Encounter: Payer: Self-pay | Admitting: *Deleted

## 2013-07-04 ENCOUNTER — Other Ambulatory Visit: Payer: Self-pay | Admitting: Family Medicine

## 2013-07-04 ENCOUNTER — Other Ambulatory Visit: Payer: Medicare Other

## 2013-07-04 DIAGNOSIS — M25569 Pain in unspecified knee: Secondary | ICD-10-CM

## 2013-07-05 ENCOUNTER — Telehealth: Payer: Self-pay | Admitting: Internal Medicine

## 2013-07-05 NOTE — Telephone Encounter (Signed)
Pt had a lab for LFT AND GGT/ scheduled  for 1/29. Pt did not know about this lab appt, and has cpe w/ fasting labs on 2/9.  Is it ok for pt to have these labs at her cpe?

## 2013-07-08 NOTE — Telephone Encounter (Signed)
lmom w/ this pt info/kh

## 2013-07-08 NOTE — Telephone Encounter (Signed)
yes

## 2013-07-09 NOTE — Telephone Encounter (Signed)
Pt aware.

## 2013-07-15 ENCOUNTER — Ambulatory Visit (INDEPENDENT_AMBULATORY_CARE_PROVIDER_SITE_OTHER): Payer: Medicare HMO | Admitting: Internal Medicine

## 2013-07-15 ENCOUNTER — Encounter: Payer: Self-pay | Admitting: Internal Medicine

## 2013-07-15 VITALS — BP 132/84 | HR 67 | Temp 97.9°F | Ht 60.25 in | Wt 124.0 lb

## 2013-07-15 DIAGNOSIS — Z0000 Encounter for general adult medical examination without abnormal findings: Secondary | ICD-10-CM

## 2013-07-15 DIAGNOSIS — G609 Hereditary and idiopathic neuropathy, unspecified: Secondary | ICD-10-CM

## 2013-07-15 DIAGNOSIS — Z85828 Personal history of other malignant neoplasm of skin: Secondary | ICD-10-CM

## 2013-07-15 DIAGNOSIS — H9190 Unspecified hearing loss, unspecified ear: Secondary | ICD-10-CM

## 2013-07-15 DIAGNOSIS — J479 Bronchiectasis, uncomplicated: Secondary | ICD-10-CM

## 2013-07-15 DIAGNOSIS — I10 Essential (primary) hypertension: Secondary | ICD-10-CM

## 2013-07-15 DIAGNOSIS — Z23 Encounter for immunization: Secondary | ICD-10-CM

## 2013-07-15 DIAGNOSIS — Z872 Personal history of diseases of the skin and subcutaneous tissue: Secondary | ICD-10-CM

## 2013-07-15 DIAGNOSIS — K219 Gastro-esophageal reflux disease without esophagitis: Secondary | ICD-10-CM

## 2013-07-15 DIAGNOSIS — E785 Hyperlipidemia, unspecified: Secondary | ICD-10-CM

## 2013-07-15 LAB — CBC WITH DIFFERENTIAL/PLATELET
BASOS PCT: 1.1 % (ref 0.0–3.0)
Basophils Absolute: 0.1 10*3/uL (ref 0.0–0.1)
Eosinophils Absolute: 0.2 10*3/uL (ref 0.0–0.7)
Eosinophils Relative: 4.6 % (ref 0.0–5.0)
HEMATOCRIT: 41.2 % (ref 36.0–46.0)
HEMOGLOBIN: 13.3 g/dL (ref 12.0–15.0)
LYMPHS ABS: 1.9 10*3/uL (ref 0.7–4.0)
LYMPHS PCT: 41.2 % (ref 12.0–46.0)
MCHC: 32.2 g/dL (ref 30.0–36.0)
MCV: 94.3 fl (ref 78.0–100.0)
MONOS PCT: 7.4 % (ref 3.0–12.0)
Monocytes Absolute: 0.3 10*3/uL (ref 0.1–1.0)
NEUTROS ABS: 2.2 10*3/uL (ref 1.4–7.7)
Neutrophils Relative %: 45.7 % (ref 43.0–77.0)
Platelets: 260 10*3/uL (ref 150.0–400.0)
RBC: 4.36 Mil/uL (ref 3.87–5.11)
RDW: 13.8 % (ref 11.5–14.6)
WBC: 4.7 10*3/uL (ref 4.5–10.5)

## 2013-07-15 LAB — HEPATIC FUNCTION PANEL
ALK PHOS: 58 U/L (ref 39–117)
ALT: 14 U/L (ref 0–35)
AST: 16 U/L (ref 0–37)
Albumin: 4 g/dL (ref 3.5–5.2)
BILIRUBIN DIRECT: 0 mg/dL (ref 0.0–0.3)
TOTAL PROTEIN: 6.6 g/dL (ref 6.0–8.3)
Total Bilirubin: 0.6 mg/dL (ref 0.3–1.2)

## 2013-07-15 LAB — LIPID PANEL
Cholesterol: 221 mg/dL — ABNORMAL HIGH (ref 0–200)
HDL: 62.4 mg/dL (ref >39.00)
Total CHOL/HDL Ratio: 4
Triglycerides: 111 mg/dL (ref 0.0–149.0)
VLDL: 22.2 mg/dL (ref 0.0–40.0)

## 2013-07-15 LAB — BASIC METABOLIC PANEL
BUN: 18 mg/dL (ref 6–23)
CALCIUM: 8.8 mg/dL (ref 8.4–10.5)
CO2: 27 meq/L (ref 19–32)
Chloride: 105 mEq/L (ref 96–112)
Creatinine, Ser: 0.7 mg/dL (ref 0.4–1.2)
GFR: 84.4 mL/min (ref >60.00)
Glucose, Bld: 89 mg/dL (ref 70–99)
POTASSIUM: 4.2 meq/L (ref 3.5–5.1)
SODIUM: 138 meq/L (ref 135–145)

## 2013-07-15 LAB — HEMOGLOBIN A1C: Hgb A1c MFr Bld: 5.5 % (ref 4.6–6.5)

## 2013-07-15 LAB — TSH: TSH: 1.64 u[IU]/mL (ref 0.35–5.50)

## 2013-07-15 MED ORDER — CLONAZEPAM 0.5 MG PO TABS: 0.5000 mg | ORAL_TABLET | Freq: Every day | ORAL | Status: DC

## 2013-07-15 NOTE — Patient Instructions (Addendum)
Your exam is good today.  If progressive  Shortness of breath contact us for more evaluation .  Ok to decrease  Gabapentin dose to see if helps memory fogginess.  Caution with the clonazepam also  For avoiding falls  Have contact us for refills in the meantime .  Will notify you  of labs when available. ROV in 6 months or as needed     Fall Prevention and [place] cause injuries and can affect all age groups. It is possible to use preventive measures to significantly decrease the likelihood of falls. There are many simple measures which can make your home safer and prevent falls. OUTDOORS  Repair cracks and edges of walkways and driveways.  Remove high doorway thresholds.  Trim shrubbery on the main path into your home.  Have good outside lighting.  Clear walkways of tools, rocks, debris, and clutter.  Check that handrails are not broken and are securely fastened. Both sides of steps should have handrails.  Have leaves, snow, and ice cleared regularly.  Use sand or salt on walkways during winter months.  In the garage, clean up grease or oil spills. BATHROOM  Install night lights.  Install grab bars by the toilet and in the tub and shower.  Use non-skid mats or decals in the tub or shower.  Place a plastic non-slip stool in the shower to sit on, if needed.  Keep floors dry and clean up all water on the floor immediately.  Remove soap buildup in the tub or shower on a regular basis.  Secure bath mats with non-slip, double-sided rug tape.  Remove throw rugs and tripping hazards from the floors. BEDROOMS  Install night lights.  Make sure a bedside light is easy to reach.  Do not use oversized bedding.  Keep a telephone by your bedside.  Have a firm chair with side arms to use for getting dressed.  Remove throw rugs and tripping hazards from the floor. KITCHEN  Keep handles on pots and pans turned toward the center of the stove. Use back burners  when possible.  Clean up spills quickly and allow time for drying.  Avoid walking on wet floors.  Avoid hot utensils and knives.  Position shelves so they are not too high or low.  Place commonly used objects within easy reach.  If necessary, use a sturdy step stool with a grab bar when reaching.  Keep electrical cables out of the way.  Do not use floor polish or wax that makes floors slippery. If you must use wax, use non-skid floor wax.  Remove throw rugs and tripping hazards from the floor. STAIRWAYS  Never leave objects on stairs.  Place handrails on both sides of stairways and use them. Fix any loose handrails. Make sure handrails on both sides of the stairways are as long as the stairs.  Check carpeting to make sure it is firmly attached along stairs. Make repairs to worn or loose carpet promptly.  Avoid placing throw rugs at the top or bottom of stairways, or properly secure the rug with carpet tape to prevent slippage. Get rid of throw rugs, if possible.  Have an electrician put in a light switch at the top and bottom of the stairs. OTHER FALL PREVENTION TIPS  Wear low-heel or rubber-soled shoes that are supportive and fit well. Wear closed toe shoes.  When using a stepladder, make sure it is fully opened and both spreaders are firmly locked. Do not climb a closed stepladder.  Add color or contrast paint or tape to grab bars and handrails in your home. Place contrasting color strips on first and last steps.  Learn and use mobility aids as needed. Install an electrical emergency response system.  Turn on lights to avoid dark areas. Replace light bulbs that burn out immediately. Get light switches that glow.  Arrange furniture to create clear pathways. Keep furniture in the same place.  Firmly attach carpet with non-skid or double-sided tape.  Eliminate uneven floor surfaces.  Select a carpet pattern that does not visually hide the edge of steps.  Be aware of  all pets. OTHER HOME SAFETY TIPS  Set the water temperature for 120 F (48.8 C).  Keep emergency numbers on or near the telephone.  Keep smoke detectors on every level of the home and near sleeping areas. Document Released: 05/13/2002 Document Revised: 11/22/2011 Document Reviewed: 08/12/2011 [facility] Patient Information 2014 [place].
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
## 2013-07-16 ENCOUNTER — Telehealth: Payer: Self-pay | Admitting: Internal Medicine

## 2013-07-16 LAB — LDL CHOLESTEROL, DIRECT: LDL DIRECT: 141.9 mg/dL

## 2013-07-16 NOTE — Telephone Encounter (Signed)
Relevant patient education mailed to patient.  

## 2013-08-06 ENCOUNTER — Encounter: Payer: Self-pay | Admitting: Internal Medicine

## 2013-08-10 ENCOUNTER — Other Ambulatory Visit: Payer: Self-pay | Admitting: Internal Medicine

## 2013-09-30 ENCOUNTER — Other Ambulatory Visit: Payer: Self-pay | Admitting: Internal Medicine

## 2013-12-26 ENCOUNTER — Ambulatory Visit (INDEPENDENT_AMBULATORY_CARE_PROVIDER_SITE_OTHER): Payer: Medicare HMO | Admitting: Internal Medicine

## 2013-12-26 ENCOUNTER — Encounter: Payer: Self-pay | Admitting: Internal Medicine

## 2013-12-26 VITALS — BP 156/82 | HR 65 | Temp 98.5°F | Ht 60.25 in | Wt 122.0 lb

## 2013-12-26 DIAGNOSIS — J3489 Other specified disorders of nose and nasal sinuses: Secondary | ICD-10-CM | POA: Insufficient documentation

## 2013-12-26 DIAGNOSIS — J339 Nasal polyp, unspecified: Secondary | ICD-10-CM | POA: Insufficient documentation

## 2013-12-26 NOTE — Patient Instructions (Signed)
This could be a polyp   Begin nasal cortisone.   flonase or nasacort are OTC  2 sprays twice a day. ti the right nostril   Continue saline rinses. Will have you see ENT doc to get a better look up nose and advise for intervention.
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
## 2014-01-04 ENCOUNTER — Other Ambulatory Visit: Payer: Self-pay | Admitting: Internal Medicine

## 2014-01-06 NOTE — Telephone Encounter (Signed)
Sent to the pharmacy by e-scribe. 

## 2014-01-06 NOTE — Telephone Encounter (Signed)
Ok to refill through feb 16

## 2014-01-13 ENCOUNTER — Ambulatory Visit: Payer: Medicare HMO | Admitting: Internal Medicine

## 2014-01-17 ENCOUNTER — Telehealth: Payer: Self-pay | Admitting: Internal Medicine

## 2014-01-17 NOTE — Telephone Encounter (Signed)
Pt states she has changed her name on her card and will bring a copy of new card by here Monday.

## 2014-01-20 ENCOUNTER — Telehealth: Payer: Self-pay | Admitting: Family Medicine

## 2014-01-20 ENCOUNTER — Telehealth: Payer: Self-pay | Admitting: Internal Medicine

## 2014-01-20 NOTE — Telephone Encounter (Signed)
Pt authorization # [id number] suspended status -01-20-2014 end 07-23-2014 [facility]  [address] [place],  [place] [phone] phone [phone] fax

## 2014-01-20 NOTE — Telephone Encounter (Signed)
ERROR

## 2014-01-20 NOTE — Telephone Encounter (Signed)
Patient stopped by to see if she needs to keep her appointment tomorrow on 01/21/2014 @1 :15pm. Patient states that she is still under the care of the doctor that she was referred to.

## 2014-01-21 ENCOUNTER — Encounter: Payer: Self-pay | Admitting: Internal Medicine

## 2014-01-21 ENCOUNTER — Ambulatory Visit (INDEPENDENT_AMBULATORY_CARE_PROVIDER_SITE_OTHER): Payer: Medicare HMO | Admitting: Internal Medicine

## 2014-01-21 VITALS — BP 142/70 | Temp 98.0°F | Ht 60.25 in | Wt 126.0 lb

## 2014-01-21 DIAGNOSIS — I10 Essential (primary) hypertension: Secondary | ICD-10-CM

## 2014-01-21 DIAGNOSIS — J3489 Other specified disorders of nose and nasal sinuses: Secondary | ICD-10-CM

## 2014-01-21 DIAGNOSIS — E785 Hyperlipidemia, unspecified: Secondary | ICD-10-CM

## 2014-01-21 DIAGNOSIS — G609 Hereditary and idiopathic neuropathy, unspecified: Secondary | ICD-10-CM

## 2014-01-21 DIAGNOSIS — G4762 Sleep related leg cramps: Secondary | ICD-10-CM

## 2014-01-21 DIAGNOSIS — Z79899 Other long term (current) drug therapy: Secondary | ICD-10-CM

## 2014-01-21 MED ORDER — CLONAZEPAM 0.5 MG PO TABS: 0.5000 mg | ORAL_TABLET | Freq: Every day | ORAL | Status: DC

## 2014-01-21 MED ORDER — LOSARTAN POTASSIUM 50 MG PO TABS: ORAL_TABLET | ORAL | Status: DC

## 2014-01-21 NOTE — Patient Instructions (Signed)
No change in medication at this time.   Wellness and labs at that visit  End of February/ early march ( i e 6 months )
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
## 2014-02-04 ENCOUNTER — Other Ambulatory Visit: Payer: Self-pay | Admitting: Internal Medicine

## 2014-02-06 NOTE — Telephone Encounter (Signed)
Ok x 6 months 

## 2014-02-06 NOTE — Telephone Encounter (Signed)
Sent to the pharmacy by e-scribe. 

## 2014-02-21 ENCOUNTER — Telehealth: Payer: Self-pay | Admitting: Family Medicine

## 2014-02-21 NOTE — Telephone Encounter (Signed)
Patient left a message on my machine.  Stated she needed to make an appt because she had a cough and some nasal "stuff."  I called her back but she did not pick up.  Advise that she not leave messages on my machine when she needs an appt because sometimes I am unable to get to my messages until the end of the day.  Also advised that she call back and speak to the triage nurse or schedule an appt if appropriate.  She called back and left another message stating that she felt better and no longer needed an appt.

## 2014-04-02 ENCOUNTER — Encounter: Payer: Self-pay | Admitting: Internal Medicine

## 2014-04-02 ENCOUNTER — Ambulatory Visit (INDEPENDENT_AMBULATORY_CARE_PROVIDER_SITE_OTHER): Payer: Medicare HMO | Admitting: Internal Medicine

## 2014-04-02 VITALS — BP 134/80 | Temp 97.9°F | Ht 60.25 in | Wt 128.0 lb

## 2014-04-02 DIAGNOSIS — R52 Pain, unspecified: Secondary | ICD-10-CM | POA: Insufficient documentation

## 2014-04-02 DIAGNOSIS — R490 Dysphonia: Secondary | ICD-10-CM

## 2014-04-02 DIAGNOSIS — Z23 Encounter for immunization: Secondary | ICD-10-CM

## 2014-04-02 DIAGNOSIS — M25561 Pain in right knee: Secondary | ICD-10-CM

## 2014-04-02 DIAGNOSIS — R3915 Urgency of urination: Secondary | ICD-10-CM | POA: Insufficient documentation

## 2014-04-02 DIAGNOSIS — R053 Chronic cough: Secondary | ICD-10-CM | POA: Insufficient documentation

## 2014-04-02 DIAGNOSIS — J339 Nasal polyp, unspecified: Secondary | ICD-10-CM

## 2014-04-02 DIAGNOSIS — E785 Hyperlipidemia, unspecified: Secondary | ICD-10-CM

## 2014-04-02 DIAGNOSIS — R05 Cough: Secondary | ICD-10-CM

## 2014-04-02 NOTE — Patient Instructions (Addendum)
Try off   When feeling better may take a few weeks  Then restart at lower dose  Every other day and see how you do . If not helping . Then we can consider other  Interventions such as zetia ,    Other meds . BP  is good .  Can 't  tell what happened with the ENT referral in July but we will  Institute  referral for the hoarseness changing and the nose exam  . Stop cough drops  Can use sugar free cough drops.   Keep appt  In February
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
## 2014-04-29 ENCOUNTER — Telehealth: Payer: Self-pay | Admitting: Internal Medicine

## 2014-04-29 NOTE — Telephone Encounter (Signed)
Ok x 6 months  Due for  Labs and monitoring in feb march

## 2014-04-29 NOTE — Telephone Encounter (Signed)
Please add clonazePAM (KLONOPIN) 0.5 MG tablet to the below re-fill request

## 2014-04-29 NOTE — Telephone Encounter (Signed)
[name], [name] [phone] is requesting re-fills on meloxicam (MOBIC) 7.5 MG tablet, DULoxetine (CYMBALTA) 60 MG capsule, gabapentin (NEURONTIN) 300 MG capsule, losartan (COZAAR) 50 MG tablet

## 2014-04-30 MED ORDER — GABAPENTIN 300 MG PO CAPS: 600.0000 mg | ORAL_CAPSULE | Freq: Three times a day (TID) | ORAL | Status: DC

## 2014-04-30 MED ORDER — MELOXICAM 7.5 MG PO TABS: 7.5000 mg | ORAL_TABLET | Freq: Every day | ORAL | Status: DC | PRN

## 2014-04-30 MED ORDER — CLONAZEPAM 0.5 MG PO TABS: 0.5000 mg | ORAL_TABLET | Freq: Every day | ORAL | Status: DC

## 2014-04-30 MED ORDER — LOSARTAN POTASSIUM 50 MG PO TABS: ORAL_TABLET | ORAL | Status: DC

## 2014-04-30 MED ORDER — DULOXETINE HCL 60 MG PO CPEP: ORAL_CAPSULE | ORAL | Status: DC

## 2014-04-30 NOTE — Telephone Encounter (Signed)
Sent to the pharmacy.  Has lab and CPX scheduled for Feb.

## 2014-05-06 ENCOUNTER — Telehealth: Payer: Self-pay | Admitting: Family Medicine

## 2014-05-06 DIAGNOSIS — M25461 Effusion, right knee: Secondary | ICD-10-CM

## 2014-05-06 DIAGNOSIS — M25561 Pain in right knee: Secondary | ICD-10-CM

## 2014-05-06 DIAGNOSIS — Z1239 Encounter for other screening for malignant neoplasm of breast: Secondary | ICD-10-CM

## 2014-05-06 LAB — HM MAMMOGRAPHY

## 2014-05-06 NOTE — Telephone Encounter (Signed)
Spoke to the pt.  She stated her insurance requires her to have a referral for a specialist.  Pt has knee pain and swelling on the same knee that she had surgery.  Order placed in the system.  Pt also stated she needed a mammo order.  Has had mammo done.  Both orders placed in the system.

## 2014-05-06 NOTE — Telephone Encounter (Signed)
Pt left a message on my machine.  She needs a referral to Dr. Maureen Ralphs to have her knee drained.

## 2014-05-13 ENCOUNTER — Encounter: Payer: Self-pay | Admitting: Family Medicine

## 2014-05-14 ENCOUNTER — Telehealth: Payer: Self-pay | Admitting: Internal Medicine

## 2014-05-14 NOTE — Telephone Encounter (Signed)
Called and spoke to [facility].  The losartan and gabapentin shipped on 05/06/14.  She had a prescription of the duloxetine filled somewhere else so they are not able to ship it out until Jan. 2016.  Not sure why a fax was received from the pharmacy.

## 2014-05-14 NOTE — Telephone Encounter (Signed)
[facility] [place], [place] [phone] is requesting re-fills on DULoxetine (CYMBALTA) 60 MG capsule, gabapentin (NEURONTIN) 300 MG capsule, losartan (COZAAR) 50 MG tablet

## 2014-05-26 ENCOUNTER — Encounter: Payer: Self-pay | Admitting: Internal Medicine

## 2014-06-19 ENCOUNTER — Other Ambulatory Visit: Payer: Self-pay

## 2014-06-19 MED ORDER — MELOXICAM 7.5 MG PO TABS: 7.5000 mg | ORAL_TABLET | Freq: Every day | ORAL | Status: DC | PRN

## 2014-07-14 ENCOUNTER — Other Ambulatory Visit (INDEPENDENT_AMBULATORY_CARE_PROVIDER_SITE_OTHER): Payer: Commercial Managed Care - HMO

## 2014-07-14 DIAGNOSIS — Z0000 Encounter for general adult medical examination without abnormal findings: Secondary | ICD-10-CM

## 2014-07-14 DIAGNOSIS — E785 Hyperlipidemia, unspecified: Secondary | ICD-10-CM

## 2014-07-14 LAB — BASIC METABOLIC PANEL
BUN: 24 mg/dL — ABNORMAL HIGH (ref 6–23)
CO2: 27 mEq/L (ref 19–32)
CREATININE: 0.84 mg/dL (ref 0.40–1.20)
Calcium: 9.2 mg/dL (ref 8.4–10.5)
Chloride: 105 mEq/L (ref 96–112)
GFR: 70.45 mL/min (ref >60.00)
Glucose, Bld: 91 mg/dL (ref 70–99)
Potassium: 4.1 mEq/L (ref 3.5–5.1)
Sodium: 139 mEq/L (ref 135–145)

## 2014-07-14 LAB — LIPID PANEL
CHOLESTEROL: 281 mg/dL — AB (ref 0–200)
HDL: 67.9 mg/dL (ref >39.00)
LDL CALC: 187 mg/dL — AB (ref 0–99)
NonHDL: 213.1
TRIGLYCERIDES: 132 mg/dL (ref 0.0–149.0)
Total CHOL/HDL Ratio: 4
VLDL: 26.4 mg/dL (ref 0.0–40.0)

## 2014-07-14 LAB — CBC WITH DIFFERENTIAL/PLATELET
BASOS ABS: 0 10*3/uL (ref 0.0–0.1)
Basophils Relative: 0.9 % (ref 0.0–3.0)
EOS PCT: 5.1 % — AB (ref 0.0–5.0)
Eosinophils Absolute: 0.3 10*3/uL (ref 0.0–0.7)
HCT: 40 % (ref 36.0–46.0)
Hemoglobin: 13.5 g/dL (ref 12.0–15.0)
LYMPHS PCT: 35.9 % (ref 12.0–46.0)
Lymphs Abs: 1.9 10*3/uL (ref 0.7–4.0)
MCHC: 33.7 g/dL (ref 30.0–36.0)
MCV: 90.8 fl (ref 78.0–100.0)
MONOS PCT: 6.9 % (ref 3.0–12.0)
Monocytes Absolute: 0.4 10*3/uL (ref 0.1–1.0)
NEUTROS ABS: 2.7 10*3/uL (ref 1.4–7.7)
Neutrophils Relative %: 51.2 % (ref 43.0–77.0)
Platelets: 243 10*3/uL (ref 150.0–400.0)
RBC: 4.4 Mil/uL (ref 3.87–5.11)
RDW: 14.4 % (ref 11.5–15.5)
WBC: 5.3 10*3/uL (ref 4.0–10.5)

## 2014-07-14 LAB — HEPATIC FUNCTION PANEL
ALT: 13 U/L (ref 0–35)
AST: 15 U/L (ref 0–37)
Albumin: 4 g/dL (ref 3.5–5.2)
Alkaline Phosphatase: 60 U/L (ref 39–117)
BILIRUBIN TOTAL: 0.4 mg/dL (ref 0.2–1.2)
Bilirubin, Direct: 0.1 mg/dL (ref 0.0–0.3)
Total Protein: 6.4 g/dL (ref 6.0–8.3)

## 2014-07-14 LAB — TSH: TSH: 3.08 u[IU]/mL (ref 0.35–4.50)

## 2014-07-15 ENCOUNTER — Telehealth: Payer: Self-pay

## 2014-07-15 ENCOUNTER — Other Ambulatory Visit: Payer: Medicare HMO

## 2014-07-15 NOTE — Telephone Encounter (Signed)
[facility] refill request for DULOXETINE HCL 60MG  CAP

## 2014-07-15 NOTE — Telephone Encounter (Signed)
Filled on 05/01/15 for six months.  Refill request is too soon.  Denied.

## 2014-07-22 ENCOUNTER — Encounter: Payer: Self-pay | Admitting: Internal Medicine

## 2014-07-22 ENCOUNTER — Ambulatory Visit (INDEPENDENT_AMBULATORY_CARE_PROVIDER_SITE_OTHER): Payer: Commercial Managed Care - HMO | Admitting: Internal Medicine

## 2014-07-22 VITALS — BP 135/70 | Temp 97.5°F | Ht 60.0 in | Wt 126.9 lb

## 2014-07-22 DIAGNOSIS — E785 Hyperlipidemia, unspecified: Secondary | ICD-10-CM

## 2014-07-22 DIAGNOSIS — Z0000 Encounter for general adult medical examination without abnormal findings: Secondary | ICD-10-CM

## 2014-07-22 DIAGNOSIS — T50905S Adverse effect of unspecified drugs, medicaments and biological substances, sequela: Secondary | ICD-10-CM

## 2014-07-22 DIAGNOSIS — Z79899 Other long term (current) drug therapy: Secondary | ICD-10-CM

## 2014-07-22 DIAGNOSIS — G4762 Sleep related leg cramps: Secondary | ICD-10-CM

## 2014-07-22 DIAGNOSIS — G609 Hereditary and idiopathic neuropathy, unspecified: Secondary | ICD-10-CM | POA: Diagnosis not present

## 2014-07-22 DIAGNOSIS — K219 Gastro-esophageal reflux disease without esophagitis: Secondary | ICD-10-CM

## 2014-07-22 DIAGNOSIS — T887XXS Unspecified adverse effect of drug or medicament, sequela: Secondary | ICD-10-CM | POA: Diagnosis not present

## 2014-07-22 MED ORDER — DULOXETINE HCL 60 MG PO CPEP: ORAL_CAPSULE | ORAL | Status: DC
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
## 2014-07-22 NOTE — Patient Instructions (Addendum)
Get your colonscopy  Yearly flu vaccine : Continue lifestyle intervention healthy eating and exercise . No data for vit E helping . Take vit D3  800 -1000iu very  Day.  Trial pravastatin every other  Day .   And fu  In 3 months .  Lab Results  Component Value Date   WBC 5.3 07/14/2014   HGB 13.5 07/14/2014   HCT 40.0 07/14/2014   PLT 243.0 07/14/2014   GLUCOSE 91 07/14/2014   CHOL 281* 07/14/2014   TRIG 132.0 07/14/2014   HDL 67.90 07/14/2014   LDLDIRECT 141.9 07/15/2013   LDLCALC 187* 07/14/2014   ALT 13 07/14/2014   AST 15 07/14/2014   NA 139 07/14/2014   K 4.1 07/14/2014   CL 105 07/14/2014   CREATININE 0.84 07/14/2014   BUN 24* 07/14/2014   CO2 27 07/14/2014   TSH 3.08 07/14/2014   HGBA1C 5.5 07/15/2013

## 2014-07-28 ENCOUNTER — Telehealth: Payer: Self-pay | Admitting: Internal Medicine

## 2014-07-28 DIAGNOSIS — Z1211 Encounter for screening for malignant neoplasm of colon: Secondary | ICD-10-CM

## 2014-07-28 NOTE — Telephone Encounter (Signed)
Pt said she checked with Dr Carlean Purl office and it has been 10 years since she had her colonoscopy. She called to ask for a referral to that office that

## 2014-07-29 ENCOUNTER — Ambulatory Visit (INDEPENDENT_AMBULATORY_CARE_PROVIDER_SITE_OTHER): Payer: Medicare HMO | Admitting: Internal Medicine

## 2014-07-29 ENCOUNTER — Telehealth: Payer: Self-pay

## 2014-07-29 ENCOUNTER — Encounter: Payer: Self-pay | Admitting: Internal Medicine

## 2014-07-29 VITALS — BP 152/72 | HR 98 | Temp 99.6°F | Ht 60.0 in | Wt 129.0 lb

## 2014-07-29 DIAGNOSIS — J470 Bronchiectasis with acute lower respiratory infection: Secondary | ICD-10-CM

## 2014-07-29 DIAGNOSIS — J22 Unspecified acute lower respiratory infection: Secondary | ICD-10-CM

## 2014-07-29 DIAGNOSIS — J988 Other specified respiratory disorders: Secondary | ICD-10-CM

## 2014-07-29 MED ORDER — AMOXICILLIN-POT CLAVULANATE 875-125 MG PO TABS: 1.0000 | ORAL_TABLET | Freq: Two times a day (BID) | ORAL | Status: DC

## 2014-07-29 MED ORDER — DULOXETINE HCL 60 MG PO CPEP: ORAL_CAPSULE | ORAL | Status: DC

## 2014-07-29 MED ORDER — HYDROCODONE-HOMATROPINE 5-1.5 MG/5ML PO SYRP: ORAL_SOLUTION | ORAL | Status: DC

## 2014-07-29 NOTE — Telephone Encounter (Signed)
Sent to the pharmacy by e-scribe. 

## 2014-07-29 NOTE — Telephone Encounter (Signed)
Referral placed in the system. 

## 2014-07-29 NOTE — Telephone Encounter (Signed)
[place] refill request for DULOXETINE 60MG  CAP.
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
## 2014-07-29 NOTE — Patient Instructions (Signed)
Rest adn fluids  Can use cough med for comfort at night  . dont want to suppress cough  All the times. If phlegm continues to be thick  Infected looking without improvement in another 2-4 days add antibiotic . Because you have a hx of bronchiectasis  We may use antibiotic earlier than  Otherwise.  Contact us  If getting fever or shortness of breath .   Cough can last 2-3 weeks but should feel better in a week either way .

## 2014-07-31 ENCOUNTER — Other Ambulatory Visit: Payer: Self-pay | Admitting: Family Medicine

## 2014-07-31 MED ORDER — PRAVASTATIN SODIUM 20 MG PO TABS: 20.0000 mg | ORAL_TABLET | Freq: Every day | ORAL | Status: DC

## 2014-07-31 NOTE — Telephone Encounter (Signed)
Sent to the pharmacy by e-scribe. 

## 2014-08-12 ENCOUNTER — Ambulatory Visit (INDEPENDENT_AMBULATORY_CARE_PROVIDER_SITE_OTHER): Payer: Commercial Managed Care - HMO | Admitting: Internal Medicine

## 2014-08-12 ENCOUNTER — Encounter: Payer: Self-pay | Admitting: Internal Medicine

## 2014-08-12 VITALS — BP 145/70 | HR 85 | Temp 98.3°F | Ht 60.0 in | Wt 125.9 lb

## 2014-08-12 DIAGNOSIS — R05 Cough: Secondary | ICD-10-CM

## 2014-08-12 DIAGNOSIS — R053 Chronic cough: Secondary | ICD-10-CM

## 2014-08-12 DIAGNOSIS — J019 Acute sinusitis, unspecified: Secondary | ICD-10-CM

## 2014-08-12 DIAGNOSIS — J470 Bronchiectasis with acute lower respiratory infection: Secondary | ICD-10-CM

## 2014-08-12 MED ORDER — LEVOFLOXACIN 750 MG PO TABS: 750.0000 mg | ORAL_TABLET | Freq: Every day | ORAL | Status: DC

## 2014-08-12 NOTE — Patient Instructions (Signed)
Change antibiotic   As discussed . Get chest x ray   Tomorrow.  In day .  No appt necessary . Last chest x ray was over  2 years ago .  Will let you know .
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
## 2014-08-13 ENCOUNTER — Ambulatory Visit (INDEPENDENT_AMBULATORY_CARE_PROVIDER_SITE_OTHER)
Admission: RE | Admit: 2014-08-13 | Discharge: 2014-08-13 | Disposition: A | Discharge status: Home / Self Care | Payer: Commercial Managed Care - HMO | Source: Ambulatory Visit | Attending: Internal Medicine | Admitting: Internal Medicine

## 2014-08-13 DIAGNOSIS — R053 Chronic cough: Secondary | ICD-10-CM

## 2014-08-13 DIAGNOSIS — J470 Bronchiectasis with acute lower respiratory infection: Secondary | ICD-10-CM

## 2014-08-13 DIAGNOSIS — R05 Cough: Secondary | ICD-10-CM

## 2014-08-22 ENCOUNTER — Telehealth: Payer: Self-pay | Admitting: Family Medicine

## 2014-08-22 NOTE — Telephone Encounter (Signed)
Pt had chest x-ray on 08/13/14 and showed PNA.  She was given levaquin.  Need to find out if she completed the antibiotics, fever etc..... Left a message for the pt to return my call.

## 2014-08-22 NOTE — Telephone Encounter (Signed)
Spoke to the pt.  She has completed the antibiotics.  Denies any fever.  Continues to have some occasional chest congestion (Banh) and cough.  Advised the cough may last for some weeks.  She will be going to see her new grand daughter.  Spoke to Dr. Sarajane Jews before he left for the day.  As long and no fever and has completed the antibiotics than she may be around the baby as long as she is washing her hands.  Advised pt of Dr. Barbie Banner directions.  She agreed.  Will call back if needed.

## 2014-08-22 NOTE — Telephone Encounter (Signed)
Pt returned my call and left a message on my machine.  Tried to return her call but no answer.  Left a message.  Will try again at a later time.

## 2014-08-28 ENCOUNTER — Encounter: Payer: Self-pay | Admitting: Internal Medicine

## 2014-08-28 ENCOUNTER — Ambulatory Visit (INDEPENDENT_AMBULATORY_CARE_PROVIDER_SITE_OTHER): Payer: Commercial Managed Care - HMO | Admitting: Internal Medicine

## 2014-08-28 VITALS — BP 140/82 | HR 72 | Temp 98.3°F | Ht 60.0 in | Wt 128.0 lb

## 2014-08-28 DIAGNOSIS — Z825 Family history of asthma and other chronic lower respiratory diseases: Secondary | ICD-10-CM

## 2014-08-28 DIAGNOSIS — J189 Pneumonia, unspecified organism: Secondary | ICD-10-CM | POA: Diagnosis not present

## 2014-08-28 DIAGNOSIS — J181 Lobar pneumonia, unspecified organism: Principal | ICD-10-CM

## 2014-08-28 DIAGNOSIS — J329 Chronic sinusitis, unspecified: Secondary | ICD-10-CM
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
## 2014-08-28 NOTE — Patient Instructions (Addendum)
Expect fatigue  to improve in the next 2 weeks . If not  Make appt to recheck.   Get follow up chest x ray   In about 2 weeks .  About April 6th   Have dr Constance Holster send [name] a copy of his evaluation.

## 2014-09-03 ENCOUNTER — Telehealth: Payer: Self-pay | Admitting: Family Medicine

## 2014-09-03 ENCOUNTER — Encounter: Payer: Self-pay | Admitting: Internal Medicine

## 2014-09-03 DIAGNOSIS — Z1211 Encounter for screening for malignant neoplasm of colon: Secondary | ICD-10-CM

## 2014-09-03 NOTE — Telephone Encounter (Signed)
Pt called and left a message on my machine.  She is requesting a referral to Dr. Celesta Aver office to have a colonoscopy.

## 2014-09-04 NOTE — Telephone Encounter (Signed)
Order placed in the system. 

## 2014-09-10 ENCOUNTER — Ambulatory Visit (INDEPENDENT_AMBULATORY_CARE_PROVIDER_SITE_OTHER)
Admission: RE | Admit: 2014-09-10 | Discharge: 2014-09-10 | Disposition: A | Discharge status: Home / Self Care | Payer: Commercial Managed Care - HMO | Source: Ambulatory Visit | Attending: Internal Medicine | Admitting: Internal Medicine

## 2014-09-10 DIAGNOSIS — J181 Lobar pneumonia, unspecified organism: Principal | ICD-10-CM

## 2014-09-10 DIAGNOSIS — J189 Pneumonia, unspecified organism: Secondary | ICD-10-CM

## 2014-09-16 ENCOUNTER — Other Ambulatory Visit: Payer: Self-pay | Admitting: Internal Medicine

## 2014-09-16 NOTE — Telephone Encounter (Signed)
Sent to the pharmacy by e-scribe. 

## 2014-10-20 ENCOUNTER — Other Ambulatory Visit: Payer: Medicare HMO

## 2014-10-20 ENCOUNTER — Other Ambulatory Visit: Payer: Self-pay | Admitting: Internal Medicine

## 2014-10-22 NOTE — Telephone Encounter (Signed)
Ok to refill both ( 6 months)

## 2014-10-27 ENCOUNTER — Other Ambulatory Visit: Payer: Commercial Managed Care - HMO

## 2014-10-28 ENCOUNTER — Other Ambulatory Visit (INDEPENDENT_AMBULATORY_CARE_PROVIDER_SITE_OTHER): Payer: Commercial Managed Care - HMO

## 2014-10-28 DIAGNOSIS — E785 Hyperlipidemia, unspecified: Secondary | ICD-10-CM | POA: Diagnosis not present

## 2014-10-28 LAB — LIPID PANEL
Cholesterol: 237 mg/dL — ABNORMAL HIGH (ref 0–200)
HDL: 60.7 mg/dL (ref >39.00)
LDL CALC: 149 mg/dL — AB (ref 0–99)
NonHDL: 176.3
TRIGLYCERIDES: 137 mg/dL (ref 0.0–149.0)
Total CHOL/HDL Ratio: 4
VLDL: 27.4 mg/dL (ref 0.0–40.0)

## 2014-11-04 ENCOUNTER — Ambulatory Visit (AMBULATORY_SURGERY_CENTER): Payer: Self-pay

## 2014-11-04 VITALS — Ht 60.0 in | Wt 125.2 lb

## 2014-11-04 DIAGNOSIS — Z1211 Encounter for screening for malignant neoplasm of colon: Secondary | ICD-10-CM

## 2014-11-04 NOTE — Progress Notes (Signed)
Per pt, no allergies to soy or egg products.Pt not taking any weight loss meds or using  O2 at home. 

## 2014-11-05 DIAGNOSIS — K922 Gastrointestinal hemorrhage, unspecified: Secondary | ICD-10-CM

## 2014-11-05 HISTORY — DX: Gastrointestinal hemorrhage, unspecified: K92.2

## 2014-11-06 ENCOUNTER — Encounter: Payer: Self-pay | Admitting: Internal Medicine

## 2014-11-06 ENCOUNTER — Ambulatory Visit (INDEPENDENT_AMBULATORY_CARE_PROVIDER_SITE_OTHER): Payer: Commercial Managed Care - HMO | Admitting: Internal Medicine

## 2014-11-06 VITALS — BP 138/80 | Temp 98.3°F | Ht 60.0 in | Wt 126.4 lb

## 2014-11-06 DIAGNOSIS — R29898 Other symptoms and signs involving the musculoskeletal system: Secondary | ICD-10-CM | POA: Diagnosis not present

## 2014-11-06 DIAGNOSIS — M545 Low back pain: Secondary | ICD-10-CM | POA: Diagnosis not present

## 2014-11-06 DIAGNOSIS — G609 Hereditary and idiopathic neuropathy, unspecified: Secondary | ICD-10-CM

## 2014-11-06 DIAGNOSIS — E785 Hyperlipidemia, unspecified: Secondary | ICD-10-CM

## 2014-11-06 NOTE — Patient Instructions (Signed)
Stay on pravastatin  Low dose as tolerated  Continue water exercise but   Resistance training can help avoid muscle atrophy  Can do PT referral .  Can look into other options    Wellness and lab in February 17
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
## 2014-11-18 ENCOUNTER — Encounter: Payer: Self-pay | Admitting: Internal Medicine

## 2014-11-18 ENCOUNTER — Ambulatory Visit (AMBULATORY_SURGERY_CENTER): Payer: Commercial Managed Care - HMO | Admitting: Internal Medicine

## 2014-11-18 VITALS — BP 150/87 | HR 59 | Temp 96.7°F | Resp 25 | Ht 60.0 in | Wt 125.0 lb

## 2014-11-18 DIAGNOSIS — D120 Benign neoplasm of cecum: Secondary | ICD-10-CM

## 2014-11-18 DIAGNOSIS — D175 Benign lipomatous neoplasm of intra-abdominal organs: Secondary | ICD-10-CM | POA: Diagnosis not present

## 2014-11-18 DIAGNOSIS — Z1211 Encounter for screening for malignant neoplasm of colon: Secondary | ICD-10-CM | POA: Diagnosis not present

## 2014-11-18 DIAGNOSIS — K5730 Diverticulosis of large intestine without perforation or abscess without bleeding: Secondary | ICD-10-CM

## 2014-11-18 MED ORDER — SODIUM CHLORIDE 0.9 % IV SOLN: 500.0000 mL | INTRAVENOUS | Status: DC

## 2014-11-18 MED ORDER — BENEFIBER PO POWD: ORAL | Status: DC

## 2014-11-18 NOTE — Patient Instructions (Addendum)
I found and removed two polyps today. I will let you know pathology results and when to have another routine colonoscopy by mail. You may need to come back in 6-12 months to make sure the larger polyp is gone.  You also have a condition called diverticulosis - common and not usually a problem but probably related to your constipation. Please read the handout provided.  Start Benefiber 2 tablespoons daily, follow high fiber diet and use milk of magnesia as needed to treat constipation. Some people do well eating prunes instead of taking Benefiber.  I appreciate the opportunity to care for you. Gatha Mayer, MD, FACGYOU HAD AN ENDOSCOPIC PROCEDURE TODAY AT [facility]:   Refer to the procedure report that was given to you for any specific questions about what was found during the examination.  If the procedure report does not answer your questions, please call your gastroenterologist to clarify.  If you requested that your care partner not be given the details of your procedure findings, then the procedure report has been included in a sealed envelope for you to review at your convenience later.  YOU SHOULD EXPECT: Some feelings of bloating in the abdomen. Passage of more gas than usual.  Walking can help get rid of the air that was put into your GI tract during the procedure and reduce the bloating. If you had a lower endoscopy (such as a colonoscopy or flexible sigmoidoscopy) you may notice spotting of blood in your stool or on the toilet paper. If you underwent a bowel prep for your procedure, you may not have a normal bowel movement for a few days.  Please Note:  You might notice some irritation and congestion in your nose or some drainage.  This is from the oxygen used during your procedure.  There is no need for concern and it should clear up in a day or so.  SYMPTOMS TO REPORT IMMEDIATELY:   Following lower endoscopy (colonoscopy or flexible  sigmoidoscopy):  Excessive amounts of blood in the stool  Significant tenderness or worsening of abdominal pains  Swelling of the abdomen that is new, acute  Fever of 100F or higher   For urgent or emergent issues, a gastroenterologist can be reached at any hour by calling [phone].   DIET: Your first meal following the procedure should be a small meal and then it is ok to progress to your normal diet. Heavy or fried foods are harder to digest and may make you feel nauseous or bloated.  Likewise, meals heavy in dairy and vegetables can increase bloating.  Drink plenty of fluids but you should avoid alcoholic beverages for 24 hours.  ACTIVITY:  You should plan to take it easy for the rest of today and you should NOT DRIVE or use heavy machinery until tomorrow (because of the sedation medicines used during the test).    FOLLOW UP: Our staff will call the number listed on your records the next business day following your procedure to check on you and address any questions or concerns that you may have regarding the information given to you following your procedure. If we do not reach you, we will leave a message.  However, if you are feeling well and you are not experiencing any problems, there is no need to return our call.  We will assume that you have returned to your regular daily activities without incident.  If any biopsies were taken you will be contacted by phone or  by letter within the next 1-3 weeks.  Please call us at [phone] if you have not heard about the biopsies in 3 weeks.    SIGNATURES/CONFIDENTIALITY: You and/or your care partner have signed paperwork which will be entered into your electronic medical record.  These signatures attest to the fact that that the information above on your After Visit Summary has been reviewed and is understood.  Full responsibility of the confidentiality of this discharge information lies with you and/or your care-partner.

## 2014-11-18 NOTE — Progress Notes (Signed)
Patient awakening,vss,report to rn 

## 2014-11-18 NOTE — Op Note (Signed)
[place]  [address] [place], [address]   COLONOSCOPY PROCEDURE REPORT  PATIENT: Julie Bonilla, Julie Bonilla  MR#: [id number] BIRTHDATE: 10-27-1940 , 74  yrs. old GENDER: female ENDOSCOPIST: Gatha Mayer, MD, [facility] PROCEDURE DATE:  11/18/2014 PROCEDURE:   Colonoscopy, screening, Colonoscopy with snare polypectomy, and Submucosal injection, any substance First Screening Colonoscopy - Avg.  risk and is 50 yrs.  old or older - No.  Prior Negative Screening - Now for repeat screening. 10 or more years since last screening  History of Adenoma - Now for follow-up colonoscopy & has been > or = to 3 yrs.  N/A  Polyps removed today? Yes ASA CLASS:   Class II INDICATIONS:Screening for colonic neoplasia and Colorectal Neoplasm Risk Assessment for this procedure is average risk. MEDICATIONS: Propofol 100 mg IV and Monitored anesthesia care  DESCRIPTION OF PROCEDURE:   After the risks benefits and alternatives of the procedure were thoroughly explained, informed consent was obtained.  The digital rectal exam revealed no abnormalities of the rectum.   The [id number]  endoscope was introduced through the anus and advanced to the cecum, which was identified by both the appendix and ileocecal valve. No adverse events experienced.   The quality of the prep was excellent. (MiraLax was used)  The instrument was then slowly withdrawn as the colon was fully examined. Estimated blood loss is zero unless otherwise noted in this procedure report.      COLON FINDINGS: A flat polyp measuring 20 mm in size with a mucous cap was found at the cecum.  Endoscopic mucosal resection was performed in a piecemeal fashion by injecting saline into the submucosa to raise the lesion.  Resection was then performed with snare cautery.  The polyp lifted well after submucosal injection There was minimal blood loss from the polypectomy which ceased spontaneously.  The wound at the site was closed  by placing hemoclips.  Two (2) placements were made.  There was no blood loss from maneuver.  Two (2) placements were made.  There was no blood loss from maneuver.   A polypoid shaped sessile polyp measuring 6 mm in size was found at the cecum.  A polypectomy was performed with a cold snare.  The resection was complete, the polyp tissue was completely retrieved and sent to histology.   There was moderate diverticulosis noted in the sigmoid colon.   The examination was otherwise normal.  Retroflexed views revealed no abnormalities. The time to cecum = 2.9 Withdrawal time = 19.2   The scope was withdrawn and the procedure completed. COMPLICATIONS: There were no immediate complications.  ENDOSCOPIC IMPRESSION: 1.   Flat polyp (2 cm) was found at the cecum; endoscopic mucosal resection was performed; the wound at the site was closed by placing hemoclips 2.   Sessile polyp was found at the cecum; polypectomy was performed with a cold snare 3.   Moderate diverticulosis was noted in the sigmoid colon 4.   The examination was otherwise normal  RECOMMENDATIONS: 1.  Hold Aspirin and all other NSAIDS for 2 weeks. Until 6/29 2.  Timing of repeat colonoscopy will be determined by pathology findings.  eSigned:  Gatha Mayer, MD, [facility] 11/18/2014 11:31 AM   cc: The Patient   PATIENT NAME:  Julie Bonilla, Julie Bonilla MR#: [id number]

## 2014-11-18 NOTE — Progress Notes (Signed)
Called to room to assist during endoscopic procedure.  Patient ID and intended procedure confirmed with present staff. Received instructions for my participation in the procedure from the performing physician.  

## 2014-11-19 ENCOUNTER — Telehealth: Payer: Self-pay

## 2014-11-19 NOTE — Telephone Encounter (Signed)
  Follow up Call-  Call back number 11/18/2014 05/22/2012  Post procedure Call Back phone  # [phone]  Permission to leave phone message Yes Yes     Patient questions:  Do you have a fever, pain , or abdominal swelling? No. Pain Score  0 *  Have you tolerated food without any problems? Yes.    Have you been able to return to your normal activities? Yes.    Do you have any questions about your discharge instructions: Diet   No. Medications  No. Follow up visit  No.  Do you have questions or concerns about your Care? No.  Actions: * If pain score is 4 or above: No action needed, pain <4.  No problems per the pt. maw

## 2014-11-24 ENCOUNTER — Telehealth: Payer: Self-pay | Admitting: Internal Medicine
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
## 2014-11-25 NOTE — Telephone Encounter (Signed)
Spoke to outside ED MD yesterday in ED no bleeding Hgb 12 VSS and CT abd/pelis ok I advised overnight observation at a minimum

## 2014-11-26 ENCOUNTER — Other Ambulatory Visit: Payer: Self-pay | Admitting: Internal Medicine

## 2014-11-26 DIAGNOSIS — K922 Gastrointestinal hemorrhage, unspecified: Secondary | ICD-10-CM

## 2014-11-26 NOTE — Telephone Encounter (Signed)
Called and left her a message tocall me (called her cell)

## 2014-11-26 NOTE — Telephone Encounter (Signed)
Spoke to her - she was observed overnight and released yesterday - a few old clots coming out but no major bleeding. Feels weak Advised to take it easy and stay off NSAID/salicylates as she is.   Told her to come to our lab when she returns from beach and we will have orders for a CBC (I placed)  Also told her not to take losartan if SBP< 100 (she asked)

## 2014-11-28 ENCOUNTER — Encounter: Payer: Self-pay | Admitting: Internal Medicine

## 2014-11-28 DIAGNOSIS — Z860101 Personal history of adenomatous and serrated colon polyps: Secondary | ICD-10-CM

## 2014-11-28 DIAGNOSIS — Z86010 Personal history of colonic polyps: Secondary | ICD-10-CM | POA: Insufficient documentation

## 2014-11-28 HISTORY — DX: Personal history of colonic polyps: Z86.010

## 2014-11-28 HISTORY — DX: Personal history of adenomatous and serrated colon polyps: Z86.0101

## 2014-11-28 NOTE — Progress Notes (Signed)
Quick Note:  Hyperplastic cecal polyp and adenoma cecum Recall colon 3 yrs Patient aware of polyp dx - I called her 6/22  No letter needed ______

## 2014-12-02 ENCOUNTER — Other Ambulatory Visit (INDEPENDENT_AMBULATORY_CARE_PROVIDER_SITE_OTHER): Payer: Commercial Managed Care - HMO

## 2014-12-02 ENCOUNTER — Ambulatory Visit: Payer: Commercial Managed Care - HMO | Attending: Internal Medicine

## 2014-12-02 DIAGNOSIS — R269 Unspecified abnormalities of gait and mobility: Secondary | ICD-10-CM | POA: Insufficient documentation

## 2014-12-02 DIAGNOSIS — R29898 Other symptoms and signs involving the musculoskeletal system: Secondary | ICD-10-CM | POA: Diagnosis not present

## 2014-12-02 DIAGNOSIS — Z7409 Other reduced mobility: Secondary | ICD-10-CM | POA: Insufficient documentation

## 2014-12-02 DIAGNOSIS — K922 Gastrointestinal hemorrhage, unspecified: Secondary | ICD-10-CM | POA: Diagnosis not present

## 2014-12-02 DIAGNOSIS — M25659 Stiffness of unspecified hip, not elsewhere classified: Secondary | ICD-10-CM

## 2014-12-02 DIAGNOSIS — R531 Weakness: Secondary | ICD-10-CM

## 2014-12-02 DIAGNOSIS — R6889 Other general symptoms and signs: Secondary | ICD-10-CM

## 2014-12-02 LAB — CBC WITH DIFFERENTIAL/PLATELET
Basophils Absolute: 0 10*3/uL (ref 0.0–0.1)
Basophils Relative: 0.7 % (ref 0.0–3.0)
EOS PCT: 2.5 % (ref 0.0–5.0)
Eosinophils Absolute: 0.2 10*3/uL (ref 0.0–0.7)
HCT: 31.1 % — ABNORMAL LOW (ref 36.0–46.0)
Hemoglobin: 10.5 g/dL — ABNORMAL LOW (ref 12.0–15.0)
LYMPHS ABS: 2.3 10*3/uL (ref 0.7–4.0)
Lymphocytes Relative: 32.1 % (ref 12.0–46.0)
MCHC: 33.6 g/dL (ref 30.0–36.0)
MCV: 91.7 fl (ref 78.0–100.0)
MONOS PCT: 7.2 % (ref 3.0–12.0)
Monocytes Absolute: 0.5 10*3/uL (ref 0.1–1.0)
Neutro Abs: 4.1 10*3/uL (ref 1.4–7.7)
Neutrophils Relative %: 57.5 % (ref 43.0–77.0)
PLATELETS: 322 10*3/uL (ref 150.0–400.0)
RBC: 3.4 Mil/uL — ABNORMAL LOW (ref 3.87–5.11)
RDW: 13.8 % (ref 11.5–15.5)
WBC: 7.1 10*3/uL (ref 4.0–10.5)

## 2014-12-02 NOTE — Patient Instructions (Signed)
Piriformis (Supine)   Cross legs, right on top. Gently pull other knee toward chest until stretch is felt in buttock/hip of top leg. Hold __20__ seconds. Repeat __3__ times per set. Do __2__ sessions per day.  http://orth.exer.us/677   Tandem Stance   Right foot in front of left, heel touching toe both feet "straight ahead".  Balance in this position _30__ seconds. Do with left foot in front of right. Do 2x a day; more if able.   POSITION: Single Leg Balance: Neck Laterally Flexed / Stance Side   Stand on right leg. Try to hold for 30 seconds.  _3__ reps each leg  _2x__ times per day.  http://ggbe.exer.us/15
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
## 2014-12-02 NOTE — Progress Notes (Signed)
Quick Note:  Letter know that hemoglobin is 10.5 so she lost some blood with the post polypectomy bleeding for sure. This should recover but to help that she should take ferrous sulfate 325 mg once a day which will make stools dark I would like her to have another CBC for follow-up of acute blood loss anemia, 6 weeks ______

## 2014-12-03 ENCOUNTER — Telehealth: Payer: Self-pay | Admitting: Family Medicine

## 2014-12-03 ENCOUNTER — Other Ambulatory Visit: Payer: Self-pay

## 2014-12-03 DIAGNOSIS — K922 Gastrointestinal hemorrhage, unspecified: Secondary | ICD-10-CM

## 2014-12-03 MED ORDER — FERROUS SULFATE 325 (65 FE) MG PO TABS: 325.0000 mg | ORAL_TABLET | Freq: Every day | ORAL | Status: DC

## 2014-12-03 NOTE — Telephone Encounter (Signed)
Pt recently had a colonoscopy and had some bleeding afterwards.  BP is now down to 130/68.  She wants to make sure that this is an acceptable reading or should she cut down on the bp med.  Notified the pt that this was an acceptable reading.  Had lab work yesterday and instructed her to start on iron Carlean Purl).  Has had some dizziness as well.  Is feeling better.  Will forward to [facility] for further instruction if needed.

## 2014-12-04 ENCOUNTER — Ambulatory Visit: Payer: Commercial Managed Care - HMO

## 2014-12-04 DIAGNOSIS — Z7409 Other reduced mobility: Secondary | ICD-10-CM

## 2014-12-04 DIAGNOSIS — R269 Unspecified abnormalities of gait and mobility: Secondary | ICD-10-CM

## 2014-12-04 DIAGNOSIS — R29898 Other symptoms and signs involving the musculoskeletal system: Secondary | ICD-10-CM

## 2014-12-04 DIAGNOSIS — R531 Weakness: Secondary | ICD-10-CM

## 2014-12-04 NOTE — Telephone Encounter (Signed)
Reading is acceptable  If 100 and below or 110 and dizzy may need to cut back but she may have jsut been dehydrated. If the bleeding continues contact  Dr Marla Roe team .
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
## 2014-12-04 NOTE — Telephone Encounter (Signed)
Pt.notified

## 2014-12-04 NOTE — Patient Instructions (Addendum)
Heel Raise: Bilateral (Standing)   Rise on balls of feet. Repeat __20__ times per set. Do __2__ sets per session. Do __2x__ sessions per day.  http://orth.exer.us/39   Abductor Strength: Bridge Pose (Strap)   Make strap wide enough to brace knees at hip width. Press into strap with knees. Lift pelvis into the air, hold for 3 seconds. Slowly lower.   Repeat __8__ times. Do 3 sets.  Clam Shell 45 Degrees   Lying with hips and knees bent 45, one pillow between knees and ankles. Lift knee. Be sure pelvis does not roll backward. Do not arch back. Do __10x_ times, each leg, 2 sets per day.    Gym Program:  - Bike for 15 minutes - Resistance 3 to start  - Leg Press: Around 70 lbs; Seat 5  - 3 way raises: 1 lbs   SHOULDER: Flexion Unilateral (Weight)   Start with arm at side. Raise both arms forward and up to shoulder high  Keep elbows straight.  Use   lb weight.10  reps per set, 2-3___ sets per day.  Copyright  VHI. All rights reserved.  SHOULDER: Abduction (Weight)   Raise arm out and up. Keep elbow straight. Do not shrug shoulders. Use  # lb weight. _10__ reps per set, 2-_3__ sets per day.  Copyright  VHI. All rights reserved.  SHOULDER: Scaption (Weight)   Place arm at 45 angle to body. Raise arm up to shoulder level - shoulder keeping elbow straight.  Use   lb weight. _10__ reps per set, _2-3__ sets per day.    [facility] [address] [place], [place] [address] Phone # [phone] Fax [phone]

## 2014-12-18 ENCOUNTER — Ambulatory Visit: Payer: Commercial Managed Care - HMO

## 2015-01-02 ENCOUNTER — Encounter: Payer: Self-pay | Admitting: Internal Medicine

## 2015-01-02 ENCOUNTER — Ambulatory Visit (INDEPENDENT_AMBULATORY_CARE_PROVIDER_SITE_OTHER): Payer: Commercial Managed Care - HMO | Admitting: Internal Medicine

## 2015-01-02 ENCOUNTER — Telehealth: Payer: Self-pay | Admitting: Internal Medicine

## 2015-01-02 VITALS — BP 130/72 | HR 68 | Temp 97.6°F | Ht 60.0 in | Wt 123.8 lb

## 2015-01-02 DIAGNOSIS — H9190 Unspecified hearing loss, unspecified ear: Secondary | ICD-10-CM

## 2015-01-02 DIAGNOSIS — R05 Cough: Secondary | ICD-10-CM

## 2015-01-02 DIAGNOSIS — R053 Chronic cough: Secondary | ICD-10-CM

## 2015-01-02 DIAGNOSIS — J470 Bronchiectasis with acute lower respiratory infection: Secondary | ICD-10-CM

## 2015-01-02 MED ORDER — DOXYCYCLINE HYCLATE 100 MG PO TABS: 100.0000 mg | ORAL_TABLET | Freq: Two times a day (BID) | ORAL | Status: DC

## 2015-01-02 NOTE — Patient Instructions (Signed)
Lungs seem ok today but  At risk of bacterial infection because of the past infections. Doxycycline  For a week .  Add  nasacort or flonase  Every day .  For at least 2 weeks .  Fu if   persistent or progressive after that or if worse.
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
## 2015-01-02 NOTE — Telephone Encounter (Signed)
Pt states Julie Bonilla is not in her network,  Pt would like to know if you prefer [place], Au.D. (AUDIOLOGY) over  Or would [name], M.D  ([place])  Be a better match for the dr she needs  Whichever you prefer, pt has asked we go ahead and make the referral. Pt has seen dr Constance Holster in the past.

## 2015-01-04 NOTE — Telephone Encounter (Signed)
I agree see Dr Constance Holster before audiologist

## 2015-01-07 ENCOUNTER — Other Ambulatory Visit: Payer: Self-pay | Admitting: Family Medicine

## 2015-01-07 DIAGNOSIS — H9193 Unspecified hearing loss, bilateral: Secondary | ICD-10-CM

## 2015-01-07 NOTE — Telephone Encounter (Signed)
Referral placed in the system. 

## 2015-01-14 ENCOUNTER — Other Ambulatory Visit: Payer: Self-pay | Admitting: Internal Medicine

## 2015-01-14 NOTE — Telephone Encounter (Signed)
Filled on 09/16/14 for six months.  Request is too early.

## 2015-01-19 ENCOUNTER — Other Ambulatory Visit (INDEPENDENT_AMBULATORY_CARE_PROVIDER_SITE_OTHER): Payer: Commercial Managed Care - HMO

## 2015-01-19 ENCOUNTER — Encounter: Payer: Self-pay | Admitting: Internal Medicine

## 2015-01-19 DIAGNOSIS — K922 Gastrointestinal hemorrhage, unspecified: Secondary | ICD-10-CM

## 2015-01-19 LAB — CBC WITH DIFFERENTIAL/PLATELET
BASOS ABS: 0.1 10*3/uL (ref 0.0–0.1)
Basophils Relative: 0.8 % (ref 0.0–3.0)
EOS PCT: 2.9 % (ref 0.0–5.0)
Eosinophils Absolute: 0.2 10*3/uL (ref 0.0–0.7)
HCT: 39.8 % (ref 36.0–46.0)
HEMOGLOBIN: 12.8 g/dL (ref 12.0–15.0)
Lymphocytes Relative: 42.3 % (ref 12.0–46.0)
Lymphs Abs: 2.6 10*3/uL (ref 0.7–4.0)
MCHC: 32.2 g/dL (ref 30.0–36.0)
MCV: 91.8 fl (ref 78.0–100.0)
MONO ABS: 0.4 10*3/uL (ref 0.1–1.0)
Monocytes Relative: 7.1 % (ref 3.0–12.0)
Neutro Abs: 2.9 10*3/uL (ref 1.4–7.7)
Neutrophils Relative %: 46.9 % (ref 43.0–77.0)
Platelets: 234 10*3/uL (ref 150.0–400.0)
RBC: 4.33 Mil/uL (ref 3.87–5.11)
RDW: 15 % (ref 11.5–15.5)
WBC: 6.2 10*3/uL (ref 4.0–10.5)

## 2015-01-19 NOTE — Progress Notes (Signed)
Quick Note:  Hgb back to normal May stop ferrous sulfate ______

## 2015-02-23 ENCOUNTER — Ambulatory Visit (INDEPENDENT_AMBULATORY_CARE_PROVIDER_SITE_OTHER): Payer: Commercial Managed Care - HMO | Admitting: Internal Medicine

## 2015-02-23 ENCOUNTER — Encounter: Payer: Self-pay | Admitting: Internal Medicine

## 2015-02-23 VITALS — BP 158/70 | HR 71 | Temp 98.2°F | Ht 60.0 in | Wt 126.2 lb

## 2015-02-23 DIAGNOSIS — Z23 Encounter for immunization: Secondary | ICD-10-CM | POA: Diagnosis not present

## 2015-02-23 DIAGNOSIS — R05 Cough: Secondary | ICD-10-CM

## 2015-02-23 DIAGNOSIS — J470 Bronchiectasis with acute lower respiratory infection: Secondary | ICD-10-CM

## 2015-02-23 DIAGNOSIS — Z8701 Personal history of pneumonia (recurrent): Secondary | ICD-10-CM | POA: Insufficient documentation

## 2015-02-23 DIAGNOSIS — R030 Elevated blood-pressure reading, without diagnosis of hypertension: Secondary | ICD-10-CM

## 2015-02-23 DIAGNOSIS — IMO0001 Reserved for inherently not codable concepts without codable children: Secondary | ICD-10-CM

## 2015-02-23 DIAGNOSIS — R053 Chronic cough: Secondary | ICD-10-CM

## 2015-02-23 MED ORDER — DOXYCYCLINE HYCLATE 100 MG PO TABS: 100.0000 mg | ORAL_TABLET | Freq: Two times a day (BID) | ORAL | Status: DC

## 2015-02-23 NOTE — Patient Instructions (Addendum)
If continuing  Through  the week with out improvement ( 5-6 days )   Of the phelgm clearing Add antibiotic  As discussed .  i fnot resolving   Then recheck .  Ok for flu vaccine today  Bp is up today  Make sure ok at home.
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
## 2015-03-24 ENCOUNTER — Other Ambulatory Visit: Payer: Self-pay | Admitting: Internal Medicine

## 2015-03-25 ENCOUNTER — Telehealth: Payer: Self-pay | Admitting: Family Medicine

## 2015-03-25 NOTE — Telephone Encounter (Signed)
Pt needs medicare wellness exam in Feb 2017.  Please help her to make fasting appointment.  Thanks!

## 2015-03-25 NOTE — Telephone Encounter (Signed)
Sent to the pharmacy.  Pt should have medicare wellness in Feb 2017.  Message sent to scheduling.

## 2015-03-25 NOTE — Telephone Encounter (Signed)
Left message on voicemail for pt to call back

## 2015-03-26 NOTE — Telephone Encounter (Signed)
Pt has been scheduled.  °

## 2015-06-04 ENCOUNTER — Other Ambulatory Visit: Payer: Self-pay | Admitting: Internal Medicine

## 2015-06-04 NOTE — Telephone Encounter (Signed)
Sent to the pharmacy by e-scribe for 90 days.  Pt has upcoming appt on 07/29/15

## 2015-06-12 ENCOUNTER — Ambulatory Visit: Payer: Commercial Managed Care - HMO | Admitting: Family Medicine

## 2015-06-19 ENCOUNTER — Ambulatory Visit (INDEPENDENT_AMBULATORY_CARE_PROVIDER_SITE_OTHER)
Admission: RE | Admit: 2015-06-19 | Discharge: 2015-06-19 | Disposition: A | Discharge status: Home / Self Care | Payer: Commercial Managed Care - HMO | Source: Ambulatory Visit | Attending: Internal Medicine | Admitting: Internal Medicine

## 2015-06-19 ENCOUNTER — Ambulatory Visit (INDEPENDENT_AMBULATORY_CARE_PROVIDER_SITE_OTHER): Payer: Commercial Managed Care - HMO | Admitting: Internal Medicine

## 2015-06-19 ENCOUNTER — Encounter: Payer: Self-pay | Admitting: Internal Medicine

## 2015-06-19 VITALS — BP 140/94 | Temp 97.4°F | Ht 60.0 in | Wt 128.2 lb

## 2015-06-19 DIAGNOSIS — T148 Other injury of unspecified body region: Secondary | ICD-10-CM

## 2015-06-19 DIAGNOSIS — S8992XA Unspecified injury of left lower leg, initial encounter: Secondary | ICD-10-CM

## 2015-06-19 DIAGNOSIS — T148XXA Other injury of unspecified body region, initial encounter: Secondary | ICD-10-CM

## 2015-06-19 DIAGNOSIS — S93402A Sprain of unspecified ligament of left ankle, initial encounter: Secondary | ICD-10-CM
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
## 2015-06-19 NOTE — Patient Instructions (Signed)
You have a mild sprained ankle     Should resolve over weeks   Use ice   If needed.  Get x ray  Today . Will let you know results  When aviaqlbe.    If get fever inc redness pain  Let us know but this may take weeks and months to totally resolved .    Acute Ankle Sprain With Phase I Rehab An acute ankle sprain is a partial or complete tear in one or more of the ligaments of the ankle due to traumatic injury. The severity of the injury depends on both the number of ligaments sprained and the grade of sprain. There are 3 grades of sprains.   A grade 1 sprain is a mild sprain. There is a slight pull without obvious tearing. There is no loss of strength, and the muscle and ligament are the correct length.  A grade 2 sprain is a moderate sprain. There is tearing of fibers within the substance of the ligament where it connects two bones or two cartilages. The length of the ligament is increased, and there is usually decreased strength.  A grade 3 sprain is a complete rupture of the ligament and is uncommon. In addition to the grade of sprain, there are three types of ankle sprains.  Lateral ankle sprains: This is a sprain of one or more of the three ligaments on the outer side (lateral) of the ankle. These are the most common sprains. Medial ankle sprains: There is one large triangular ligament of the inner side (medial) of the ankle that is susceptible to injury. Medial ankle sprains are less common. Syndesmosis, "high ankle," sprains: The syndesmosis is the ligament that connects the two bones of the lower leg. Syndesmosis sprains usually only occur with very severe ankle sprains. SYMPTOMS  Pain, tenderness, and swelling in the ankle, starting at the side of injury that may progress to the whole ankle and foot with time.  "Pop" or tearing sensation at the time of injury.  Bruising that may spread to the heel.  Impaired ability to walk soon after injury. CAUSES   Acute ankle sprains are  caused by trauma placed on the ankle that temporarily forces or pries the anklebone (talus) out of its normal socket.  Stretching or tearing of the ligaments that normally hold the joint in place (usually due to a twisting injury). RISK INCREASES WITH:  Previous ankle sprain.  Sports in which the foot may land awkwardly (i.e., basketball, volleyball, or soccer) or walking or running on uneven or rough surfaces.  Shoes with inadequate support to prevent sideways motion when stress occurs.  Poor strength and flexibility.  Poor balance skills.  Contact sports. PREVENTION   Warm up and stretch properly before activity.  Maintain physical fitness:  Ankle and leg flexibility, muscle strength, and endurance.  Cardiovascular fitness.  Balance training activities.  Use proper technique and have a coach correct improper technique.  Taping, protective strapping, bracing, or high-top tennis shoes may help prevent injury. Initially, tape is best; however, it loses most of its support function within 10 to 15 minutes.  Wear proper-fitted protective shoes (High-top shoes with taping or bracing is more effective than either alone).  Provide the ankle with support during sports and practice activities for 12 months following injury. PROGNOSIS   If treated properly, ankle sprains can be expected to recover completely; however, the length of recovery depends on the degree of injury.  A grade 1 sprain usually heals enough in  5 to 7 days to allow modified activity and requires an average of 6 weeks to heal completely.  A grade 2 sprain requires 6 to 10 weeks to heal completely.  A grade 3 sprain requires 12 to 16 weeks to heal.  A syndesmosis sprain often takes more than 3 months to heal. RELATED COMPLICATIONS   Frequent recurrence of symptoms may result in a chronic problem. Appropriately addressing the problem the first time decreases the frequency of recurrence and optimizes healing  time. Severity of the initial sprain does not predict the likelihood of later instability.  Injury to other structures (bone, cartilage, or tendon).  A chronically unstable or arthritic ankle joint is a possibility with repeated sprains. TREATMENT Treatment initially involves the use of ice, medication, and compression bandages to help reduce pain and inflammation. Ankle sprains are usually immobilized in a walking cast or boot to allow for healing. Crutches may be recommended to reduce pressure on the injury. After immobilization, strengthening and stretching exercises may be necessary to regain strength and a full range of motion. Surgery is rarely needed to treat ankle sprains. MEDICATION   Nonsteroidal anti-inflammatory medications, such as aspirin and ibuprofen (do not take for the first 3 days after injury or within 7 days before surgery), or other minor pain relievers, such as acetaminophen, are often recommended. Take these as directed by your caregiver. Contact your caregiver immediately if any bleeding, stomach upset, or signs of an allergic reaction occur from these medications.  Ointments applied to the skin may be helpful.  Pain relievers may be prescribed as necessary by your caregiver. Do not take prescription pain medication for longer than 4 to 7 days. Use only as directed and only as much as you need. HEAT AND COLD  Cold treatment (icing) is used to relieve pain and reduce inflammation for acute and chronic cases. Cold should be applied for 10 to 15 minutes every 2 to 3 hours for inflammation and pain and immediately after any activity that aggravates your symptoms. Use ice packs or an ice massage.  Heat treatment may be used before performing stretching and strengthening activities prescribed by your caregiver. Use a heat pack or a warm soak. SEEK IMMEDIATE MEDICAL CARE IF:   Pain, swelling, or bruising worsens despite treatment.  You experience pain, numbness,  discoloration, or coldness in the foot or toes.  New, unexplained symptoms develop (drugs used in treatment may produce side effects.) EXERCISES  PHASE I EXERCISES RANGE OF MOTION (ROM) AND STRETCHING EXERCISES - Ankle Sprain, Acute Phase I, Weeks 1 to 2 These exercises may help you when beginning to restore flexibility in your ankle. You will likely work on these exercises for the 1 to 2 weeks after your injury. Once your physician, physical therapist, or athletic trainer sees adequate progress, he or she will advance your exercises. While completing these exercises, remember:   Restoring tissue flexibility helps normal motion to return to the joints. This allows healthier, less painful movement and activity.  An effective stretch should be held for at least 30 seconds.  A stretch should never be painful. You should only feel a gentle lengthening or release in the stretched tissue. RANGE OF MOTION - Dorsi/Plantar Flexion  While sitting with your right / left knee straight, draw the top of your foot upwards by flexing your ankle. Then reverse the motion, pointing your toes downward.  Hold each position for __________ seconds.  After completing your first set of exercises, repeat this exercise with your  knee bent. Repeat __________ times. Complete this exercise __________ times per day.  RANGE OF MOTION - Ankle Alphabet  Imagine your right / left big toe is a pen.  Keeping your hip and knee still, write out the entire alphabet with your "pen." Make the letters as large as you can without increasing any discomfort. Repeat __________ times. Complete this exercise __________ times per day.  STRENGTHENING EXERCISES - Ankle Sprain, Acute -Phase I, Weeks 1 to 2 These exercises may help you when beginning to restore strength in your ankle. You will likely work on these exercises for 1 to 2 weeks after your injury. Once your physician, physical therapist, or athletic trainer sees adequate  progress, he or she will advance your exercises. While completing these exercises, remember:   Muscles can gain both the endurance and the strength needed for everyday activities through controlled exercises.  Complete these exercises as instructed by your physician, physical therapist, or athletic trainer. Progress the resistance and repetitions only as guided.  You may experience muscle soreness or fatigue, but the pain or discomfort you are trying to eliminate should never worsen during these exercises. If this pain does worsen, stop and make certain you are following the directions exactly. If the pain is still present after adjustments, discontinue the exercise until you can discuss the trouble with your clinician. STRENGTH - Dorsiflexors  Secure a rubber exercise band/tubing to a fixed object (i.e., table, pole) and loop the other end around your right / left foot.  Sit on the floor facing the fixed object. The band/tubing should be slightly tense when your foot is relaxed.  Slowly draw your foot back toward you using your ankle and toes.  Hold this position for __________ seconds. Slowly release the tension in the band and return your foot to the starting position. Repeat __________ times. Complete this exercise __________ times per day.  STRENGTH - Plantar-flexors   Sit with your right / left leg extended. Holding onto both ends of a rubber exercise band/tubing, loop it around the ball of your foot. Keep a slight tension in the band.  Slowly push your toes away from you, pointing them downward.  Hold this position for __________ seconds. Return slowly, controlling the tension in the band/tubing. Repeat __________ times. Complete this exercise __________ times per day.  STRENGTH - Ankle Eversion  Secure one end of a rubber exercise band/tubing to a fixed object (table, pole). Loop the other end around your foot just before your toes.  Place your fists between your knees. This will  focus your strengthening at your ankle.  Drawing the band/tubing across your opposite foot, slowly, pull your little toe out and up. Make sure the band/tubing is positioned to resist the entire motion.  Hold this position for __________ seconds. Have your muscles resist the band/tubing as it slowly pulls your foot back to the starting position.  Repeat __________ times. Complete this exercise __________ times per day.  STRENGTH - Ankle Inversion  Secure one end of a rubber exercise band/tubing to a fixed object (table, pole). Loop the other end around your foot just before your toes.  Place your fists between your knees. This will focus your strengthening at your ankle.  Slowly, pull your big toe up and in, making sure the band/tubing is positioned to resist the entire motion.  Hold this position for __________ seconds.  Have your muscles resist the band/tubing as it slowly pulls your foot back to the starting position. Repeat __________ times.  Complete this exercises __________ times per day.  STRENGTH - Towel Curls  Sit in a chair positioned on a non-carpeted surface.  Place your right / left foot on a towel, keeping your heel on the floor.  Pull the towel toward your heel by only curling your toes. Keep your heel on the floor.  If instructed by your physician, physical therapist, or athletic trainer, add weight to the end of the towel. Repeat __________ times. Complete this exercise __________ times per day.   This information is not intended to replace advice given to you by your health care provider. Make sure you discuss any questions you have with your health care provider.   Document Released: 12/22/2004 Document Revised: 06/13/2014 Document Reviewed: 09/04/2008 Elsevier Interactive Patient Education [employer].

## 2015-07-27 ENCOUNTER — Other Ambulatory Visit: Payer: Self-pay | Admitting: Internal Medicine

## 2015-07-29 ENCOUNTER — Encounter: Payer: Self-pay | Admitting: Internal Medicine

## 2015-07-29 ENCOUNTER — Ambulatory Visit (INDEPENDENT_AMBULATORY_CARE_PROVIDER_SITE_OTHER): Payer: Commercial Managed Care - HMO | Admitting: Internal Medicine

## 2015-07-29 VITALS — BP 146/90 | Temp 97.7°F | Ht 60.0 in | Wt 127.4 lb

## 2015-07-29 DIAGNOSIS — Z0000 Encounter for general adult medical examination without abnormal findings: Secondary | ICD-10-CM | POA: Diagnosis not present

## 2015-07-29 DIAGNOSIS — K219 Gastro-esophageal reflux disease without esophagitis: Secondary | ICD-10-CM

## 2015-07-29 DIAGNOSIS — H9190 Unspecified hearing loss, unspecified ear: Secondary | ICD-10-CM

## 2015-07-29 DIAGNOSIS — T50905A Adverse effect of unspecified drugs, medicaments and biological substances, initial encounter: Secondary | ICD-10-CM

## 2015-07-29 DIAGNOSIS — Z0001 Encounter for general adult medical examination with abnormal findings: Secondary | ICD-10-CM | POA: Diagnosis not present

## 2015-07-29 DIAGNOSIS — E785 Hyperlipidemia, unspecified: Secondary | ICD-10-CM

## 2015-07-29 DIAGNOSIS — T887XXA Unspecified adverse effect of drug or medicament, initial encounter: Secondary | ICD-10-CM

## 2015-07-29 DIAGNOSIS — I10 Essential (primary) hypertension: Secondary | ICD-10-CM

## 2015-07-29 DIAGNOSIS — Z79899 Other long term (current) drug therapy: Secondary | ICD-10-CM | POA: Diagnosis not present

## 2015-07-29 LAB — BASIC METABOLIC PANEL
BUN: 18 mg/dL (ref 6–23)
CALCIUM: 9.5 mg/dL (ref 8.4–10.5)
CO2: 31 meq/L (ref 19–32)
CREATININE: 0.69 mg/dL (ref 0.40–1.20)
Chloride: 105 mEq/L (ref 96–112)
GFR: 88.15 mL/min (ref >60.00)
Glucose, Bld: 94 mg/dL (ref 70–99)
Potassium: 4.8 mEq/L (ref 3.5–5.1)
Sodium: 141 mEq/L (ref 135–145)

## 2015-07-29 LAB — LIPID PANEL
CHOL/HDL RATIO: 4
Cholesterol: 264 mg/dL — ABNORMAL HIGH (ref 0–200)
HDL: 66 mg/dL (ref >39.00)
LDL CALC: 167 mg/dL — AB (ref 0–99)
NONHDL: 197.54
Triglycerides: 154 mg/dL — ABNORMAL HIGH (ref 0.0–149.0)
VLDL: 30.8 mg/dL (ref 0.0–40.0)

## 2015-07-29 LAB — CBC WITH DIFFERENTIAL/PLATELET
BASOS ABS: 0 10*3/uL (ref 0.0–0.1)
Basophils Relative: 0.8 % (ref 0.0–3.0)
EOS ABS: 0.2 10*3/uL (ref 0.0–0.7)
Eosinophils Relative: 3.4 % (ref 0.0–5.0)
HEMATOCRIT: 42.3 % (ref 36.0–46.0)
Hemoglobin: 14.2 g/dL (ref 12.0–15.0)
LYMPHS PCT: 45.7 % (ref 12.0–46.0)
Lymphs Abs: 2.2 10*3/uL (ref 0.7–4.0)
MCHC: 33.7 g/dL (ref 30.0–36.0)
MCV: 91.6 fl (ref 78.0–100.0)
Monocytes Absolute: 0.4 10*3/uL (ref 0.1–1.0)
Monocytes Relative: 8.7 % (ref 3.0–12.0)
NEUTROS ABS: 2 10*3/uL (ref 1.4–7.7)
Neutrophils Relative %: 41.4 % — ABNORMAL LOW (ref 43.0–77.0)
PLATELETS: 237 10*3/uL (ref 150.0–400.0)
RBC: 4.62 Mil/uL (ref 3.87–5.11)
RDW: 14 % (ref 11.5–15.5)
WBC: 4.9 10*3/uL (ref 4.0–10.5)

## 2015-07-29 LAB — HEPATIC FUNCTION PANEL
ALK PHOS: 53 U/L (ref 39–117)
ALT: 13 U/L (ref 0–35)
AST: 16 U/L (ref 0–37)
Albumin: 4.4 g/dL (ref 3.5–5.2)
BILIRUBIN DIRECT: 0.1 mg/dL (ref 0.0–0.3)
TOTAL PROTEIN: 6.7 g/dL (ref 6.0–8.3)
Total Bilirubin: 0.5 mg/dL (ref 0.2–1.2)

## 2015-07-29 LAB — TSH: TSH: 1.66 u[IU]/mL (ref 0.35–4.50)

## 2015-07-29 NOTE — Patient Instructions (Addendum)
Agree with your med change  . Will notify you  of labs when available.  ROV in 6 - 12 months or  depending on labs ...       Health Maintenance, Female Adopting a healthy lifestyle and getting preventive care can go a long way to promote health and wellness. Talk with your health care provider about what schedule of regular examinations is right for you. This is a good chance for you to check in with your provider about disease prevention and staying healthy. In between checkups, there are plenty of things you can do on your own. Experts have done a lot of research about which lifestyle changes and preventive measures are most likely to keep you healthy. Ask your health care provider for more information. WEIGHT AND DIET  Eat a healthy diet  Be sure to include plenty of vegetables, fruits, low-fat dairy products, and lean protein.  Do not eat a lot of foods high in solid fats, added sugars, or salt.  Get regular exercise. This is one of the most important things you can do for your health.  Most adults should exercise for at least 150 minutes each week. The exercise should increase your heart rate and make you sweat (moderate-intensity exercise).  Most adults should also do strengthening exercises at least twice a week. This is in addition to the moderate-intensity exercise.  Maintain a healthy weight  Body mass index (BMI) is a measurement that can be used to identify possible weight problems. It estimates body fat based on height and weight. Your health care provider can help determine your BMI and help you achieve or maintain a healthy weight.  For females 74 years of age and older:   A BMI below 18.5 is considered underweight.  A BMI of 18.5 to 24.9 is normal.  A BMI of 25 to 29.9 is considered overweight.  A BMI of 30 and above is considered obese.  Watch levels of cholesterol and blood lipids  You should start having your blood tested for lipids and cholesterol at 75  years of age, then have this test every 5 years.  You may need to have your cholesterol levels checked more often if:  Your lipid or cholesterol levels are high.  You are older than 75 years of age.  You are at high risk for heart disease.  CANCER SCREENING   Lung Cancer  Lung cancer screening is recommended for adults 33-48 years old who are at high risk for lung cancer because of a history of smoking.  A yearly low-dose CT scan of the lungs is recommended for people who:  Currently smoke.  Have quit within the past 15 years.  Have at least a 30-pack-year history of smoking. A pack year is smoking an average of one pack of cigarettes a day for 1 year.  Yearly screening should continue until it has been 15 years since you quit.  Yearly screening should stop if you develop a health problem that would prevent you from having lung cancer treatment.  Breast Cancer  Practice breast self-awareness. This means understanding how your breasts normally appear and feel.  It also means doing regular breast self-exams. Let your health care provider know about any changes, no matter how small.  If you are in your 20s or 30s, you should have a clinical breast exam (CBE) by a health care provider every 1-3 years as part of a regular health exam.  If you are 56 or older, have a  CBE every year. Also consider having a breast X-ray (mammogram) every year.  If you have a family history of breast cancer, talk to your health care provider about genetic screening.  If you are at high risk for breast cancer, talk to your health care provider about having an MRI and a mammogram every year.  Breast cancer gene (BRCA) assessment is recommended for women who have family members with BRCA-related cancers. BRCA-related cancers include:  Breast.  Ovarian.  Tubal.  Peritoneal cancers.  Results of the assessment will determine the need for genetic counseling and BRCA1 and BRCA2 testing. Cervical  Cancer Your health care provider may recommend that you be screened regularly for cancer of the pelvic organs (ovaries, uterus, and vagina). This screening involves a pelvic examination, including checking for microscopic changes to the surface of your cervix (Pap test). You may be encouraged to have this screening done every 3 years, beginning at age 21.  For women ages 64-65, health care providers may recommend pelvic exams and Pap testing every 3 years, or they may recommend the Pap and pelvic exam, combined with testing for human papilloma virus (HPV), every 5 years. Some types of HPV increase your risk of cervical cancer. Testing for HPV may also be done on women of any age with unclear Pap test results.  Other health care providers may not recommend any screening for nonpregnant women who are considered low risk for pelvic cancer and who do not have symptoms. Ask your health care provider if a screening pelvic exam is right for you.  If you have had past treatment for cervical cancer or a condition that could lead to cancer, you need Pap tests and screening for cancer for at least 20 years after your treatment. If Pap tests have been discontinued, your risk factors (such as having a new sexual partner) need to be reassessed to determine if screening should resume. Some women have medical problems that increase the chance of getting cervical cancer. In these cases, your health care provider may recommend more frequent screening and Pap tests. Colorectal Cancer  This type of cancer can be detected and often prevented.  Routine colorectal cancer screening usually begins at 75 years of age and continues through 75 years of age.  Your health care provider may recommend screening at an earlier age if you have risk factors for colon cancer.  Your health care provider may also recommend using home test kits to check for hidden blood in the stool.  A small camera at the end of a tube can be used to  examine your colon directly (sigmoidoscopy or colonoscopy). This is done to check for the earliest forms of colorectal cancer.  Routine screening usually begins at age 57.  Direct examination of the colon should be repeated every 5-10 years through 75 years of age. However, you may need to be screened more often if early forms of precancerous polyps or small growths are found. Skin Cancer  Check your skin from head to toe regularly.  Tell your health care provider about any new moles or changes in moles, especially if there is a change in a mole's shape or color.  Also tell your health care provider if you have a mole that is larger than the size of a pencil eraser.  Always use sunscreen. Apply sunscreen liberally and repeatedly throughout the day.  Protect yourself by wearing long sleeves, pants, a wide-brimmed hat, and sunglasses whenever you are outside. HEART DISEASE, DIABETES, AND HIGH BLOOD  PRESSURE   High blood pressure causes heart disease and increases the risk of stroke. High blood pressure is more likely to develop in:  People who have blood pressure in the high end of the normal range (130-139/85-89 mm Hg).  People who are overweight or obese.  People who are African American.  If you are 9-39 years of age, have your blood pressure checked every 3-5 years. If you are 22 years of age or older, have your blood pressure checked every year. You should have your blood pressure measured twice--once when you are at a hospital or clinic, and once when you are not at a hospital or clinic. Record the average of the two measurements. To check your blood pressure when you are not at a hospital or clinic, you can use:  An automated blood pressure machine at a pharmacy.  A home blood pressure monitor.  If you are between 31 years and 65 years old, ask your health care provider if you should take aspirin to prevent strokes.  Have regular diabetes screenings. This involves taking a  blood sample to check your fasting blood sugar level.  If you are at a normal weight and have a low risk for diabetes, have this test once every three years after 75 years of age.  If you are overweight and have a high risk for diabetes, consider being tested at a younger age or more often. PREVENTING INFECTION  Hepatitis B  If you have a higher risk for hepatitis B, you should be screened for this virus. You are considered at high risk for hepatitis B if:  You were born in a country where hepatitis B is common. Ask your health care provider which countries are considered high risk.  Your parents were born in a high-risk country, and you have not been immunized against hepatitis B (hepatitis B vaccine).  You have HIV or AIDS.  You use needles to inject street drugs.  You live with someone who has hepatitis B.  You have had sex with someone who has hepatitis B.  You get hemodialysis treatment.  You take certain medicines for conditions, including cancer, organ transplantation, and autoimmune conditions. Hepatitis C  Blood testing is recommended for:  Everyone born from [birth year] through 1965.  Anyone with known risk factors for hepatitis C. Sexually transmitted infections (STIs)  You should be screened for sexually transmitted infections (STIs) including gonorrhea and chlamydia if:  You are sexually active and are younger than 75 years of age.  You are older than 75 years of age and your health care provider tells you that you are at risk for this type of infection.  Your sexual activity has changed since you were last screened and you are at an increased risk for chlamydia or gonorrhea. Ask your health care provider if you are at risk.  If you do not have HIV, but are at risk, it may be recommended that you take a prescription medicine daily to prevent HIV infection. This is called pre-exposure prophylaxis (PrEP). You are considered at risk if:  You are sexually active and do  not regularly use condoms or know the HIV status of your partner(s).  You take drugs by injection.  You are sexually active with a partner who has HIV. Talk with your health care provider about whether you are at high risk of being infected with HIV. If you choose to begin PrEP, you should first be tested for HIV. You should then be tested every 3  months for as long as you are taking PrEP.  PREGNANCY   If you are premenopausal and you may become pregnant, ask your health care provider about preconception counseling.  If you may become pregnant, take 400 to 800 micrograms (mcg) of folic acid every day.  If you want to prevent pregnancy, talk to your health care provider about birth control (contraception). OSTEOPOROSIS AND MENOPAUSE   Osteoporosis is a disease in which the bones lose minerals and strength with aging. This can result in serious bone fractures. Your risk for osteoporosis can be identified using a bone density scan.  If you are 37 years of age or older, or if you are at risk for osteoporosis and fractures, ask your health care provider if you should be screened.  Ask your health care provider whether you should take a calcium or vitamin D supplement to lower your risk for osteoporosis.  Menopause may have certain physical symptoms and risks.  Hormone replacement therapy may reduce some of these symptoms and risks. Talk to your health care provider about whether hormone replacement therapy is right for you.  HOME CARE INSTRUCTIONS   Schedule regular health, dental, and eye exams.  Stay current with your immunizations.   Do not use any tobacco products including cigarettes, chewing tobacco, or electronic cigarettes.  If you are pregnant, do not drink alcohol.  If you are breastfeeding, limit how much and how often you drink alcohol.  Limit alcohol intake to no more than 1 drink per day for nonpregnant women. One drink equals 12 ounces of beer, 5 ounces of wine, or 1  ounces of hard liquor.  Do not use street drugs.  Do not share needles.  Ask your health care provider for help if you need support or information about quitting drugs.  Tell your health care provider if you often feel depressed.  Tell your health care provider if you have ever been abused or do not feel safe at home.   This information is not intended to replace advice given to you by your health care provider. Make sure you discuss any questions you have with your health care provider.   Document Released: 12/06/2010 Document Revised: 06/13/2014 Document Reviewed: 04/24/2013 Elsevier Interactive Patient Education [employer].
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
## 2015-07-30 NOTE — Telephone Encounter (Signed)
Ok to refill x 6 months 

## 2015-07-30 NOTE — Telephone Encounter (Signed)
Sent to the pharmacy

## 2015-08-11 ENCOUNTER — Ambulatory Visit (INDEPENDENT_AMBULATORY_CARE_PROVIDER_SITE_OTHER): Payer: Commercial Managed Care - HMO | Admitting: Internal Medicine

## 2015-08-11 ENCOUNTER — Encounter: Payer: Self-pay | Admitting: Internal Medicine

## 2015-08-11 VITALS — BP 126/80 | Temp 97.6°F | Ht 60.0 in | Wt 128.3 lb

## 2015-08-11 DIAGNOSIS — H019 Unspecified inflammation of eyelid: Secondary | ICD-10-CM

## 2015-08-11 MED ORDER — ERYTHROMYCIN 5 MG/GM OP OINT: 1.0000 "application " | TOPICAL_OINTMENT | Freq: Four times a day (QID) | OPHTHALMIC | Status: DC

## 2015-08-11 NOTE — Patient Instructions (Addendum)
Treating for eye lid infection . Warm compress and then antibiotic ointment  If  Not improving in the next 48 hours  Need you to see   Your eye doctor .    See immediate care if getting  Rash  Or other   progression
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
## 2015-08-14 ENCOUNTER — Ambulatory Visit (INDEPENDENT_AMBULATORY_CARE_PROVIDER_SITE_OTHER): Payer: Commercial Managed Care - HMO | Admitting: Internal Medicine

## 2015-08-14 ENCOUNTER — Encounter: Payer: Self-pay | Admitting: Internal Medicine

## 2015-08-14 VITALS — BP 146/80 | Temp 97.7°F | Ht 60.0 in | Wt 128.3 lb

## 2015-08-14 DIAGNOSIS — H019 Unspecified inflammation of eyelid: Secondary | ICD-10-CM

## 2015-08-14 DIAGNOSIS — Z85828 Personal history of other malignant neoplasm of skin: Secondary | ICD-10-CM | POA: Diagnosis not present

## 2015-08-14 MED ORDER — CEPHALEXIN 500 MG PO CAPS: 500.0000 mg | ORAL_CAPSULE | Freq: Three times a day (TID) | ORAL | Status: DC
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
## 2015-08-14 NOTE — Patient Instructions (Signed)
contionue the topical antibiotic and compresses   If not continue to improve over the next 1-2 days then also add oral antibiotic.   Will put in a referral for   Eye  .  I still want you to see dr Ellie Lunch   To check the lower lid .

## 2015-09-09 ENCOUNTER — Encounter: Payer: Self-pay | Admitting: Internal Medicine

## 2015-12-15 ENCOUNTER — Other Ambulatory Visit: Payer: Self-pay | Admitting: Internal Medicine

## 2015-12-15 NOTE — Telephone Encounter (Signed)
Sent to the pharmacy by e-scribe. 

## 2016-01-18 ENCOUNTER — Telehealth: Payer: Self-pay | Admitting: Family Medicine

## 2016-01-18 NOTE — Telephone Encounter (Signed)
Pt called and left a message on my machine.  Stated she has had a dry hacking cough for 5-6 wks.  She thought it may be her losartan.  Reviewed patient's chart and seen she has been on losartan for many years.  Called the pt and spoke to her.  Advised that her cough is probably not related to the losartan.  Asked about other sx.  She stated the cough is sometimes a little productive.  She described the cough as "different."  She denied chest pain, heaviness and sob.  No other sx reported.  Will forward to [facility].  Pt scheduled to come in on 01/19/16 @ 8:30.  Pt notified to arrive at 8:15 AM for check in.
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
## 2016-01-18 NOTE — Telephone Encounter (Signed)
Agree with OV 

## 2016-01-19 ENCOUNTER — Ambulatory Visit (INDEPENDENT_AMBULATORY_CARE_PROVIDER_SITE_OTHER): Payer: Commercial Managed Care - HMO | Admitting: Internal Medicine

## 2016-01-19 ENCOUNTER — Ambulatory Visit (INDEPENDENT_AMBULATORY_CARE_PROVIDER_SITE_OTHER)
Admission: RE | Admit: 2016-01-19 | Discharge: 2016-01-19 | Disposition: A | Discharge status: Home / Self Care | Payer: Commercial Managed Care - HMO | Source: Ambulatory Visit | Attending: Internal Medicine | Admitting: Internal Medicine

## 2016-01-19 ENCOUNTER — Encounter: Payer: Self-pay | Admitting: Internal Medicine

## 2016-01-19 VITALS — BP 146/78 | HR 63 | Temp 97.9°F | Ht 60.0 in | Wt 125.4 lb

## 2016-01-19 DIAGNOSIS — R05 Cough: Secondary | ICD-10-CM

## 2016-01-19 DIAGNOSIS — K219 Gastro-esophageal reflux disease without esophagitis: Secondary | ICD-10-CM | POA: Diagnosis not present

## 2016-01-19 DIAGNOSIS — R053 Chronic cough: Secondary | ICD-10-CM

## 2016-01-19 MED ORDER — RANITIDINE HCL 150 MG PO TABS: 150.0000 mg | ORAL_TABLET | Freq: Two times a day (BID) | ORAL | 1 refills | Status: DC

## 2016-01-19 NOTE — Patient Instructions (Addendum)
Get chest x ray    . Will be contact about results  If all ok then plan ranitidine 150 mg 1 twice a day for at least 2-3 weeks to see if helps.  If getting more infected looking phlegm call and we may add short course opf antibiotic because you have bronchiectasis  Are of lung in past. That can get infected .  Avoid oily supplements or menthol that can irritate the airway and cause cough   ROV in 1 months or let contact my chart about next step .

## 2016-02-04 ENCOUNTER — Telehealth: Payer: Self-pay | Admitting: Internal Medicine

## 2016-02-04 DIAGNOSIS — H18463 Peripheral corneal degeneration, bilateral: Secondary | ICD-10-CM

## 2016-02-04 NOTE — Telephone Encounter (Signed)
° ° °  Pt call to ask for a referral to see a Eye Specialist for the following issue Pulucid Degeneration    Doctor; Nelle Don

## 2016-02-05 NOTE — Telephone Encounter (Signed)
Pt has [facility] and requires referral.

## 2016-02-24 ENCOUNTER — Telehealth: Payer: Self-pay | Admitting: Internal Medicine

## 2016-02-24 DIAGNOSIS — M25561 Pain in right knee: Secondary | ICD-10-CM

## 2016-02-24 DIAGNOSIS — M25562 Pain in left knee: Principal | ICD-10-CM

## 2016-02-24 NOTE — Telephone Encounter (Signed)
Left a message for a return call.  Need to know if it is left, right or bilateral knee pain.

## 2016-02-24 NOTE — Telephone Encounter (Signed)
Ok to do this 

## 2016-02-24 NOTE — Telephone Encounter (Signed)
° °  Pt call to ask for a referral to see Dr Maureen Ralphs for knee pain

## 2016-02-26 NOTE — Telephone Encounter (Signed)
Spoke to the pt and she stated she is having bilateral knee pain and the rt is worse than left.  Referral placed in the computer.  Pt informed.

## 2016-03-04 ENCOUNTER — Telehealth: Payer: Self-pay | Admitting: Internal Medicine

## 2016-03-04 NOTE — Telephone Encounter (Signed)
Pt request refill  gabapentin (NEURONTIN) 300 MG capsule  90 days Pt states 2 /day  [facility]

## 2016-03-07 MED ORDER — GABAPENTIN 300 MG PO CAPS: 300.0000 mg | ORAL_CAPSULE | Freq: Two times a day (BID) | ORAL | 0 refills | Status: DC

## 2016-03-07 NOTE — Telephone Encounter (Signed)
Rx sent to pharmacy as requested.

## 2016-03-26 ENCOUNTER — Telehealth: Payer: Self-pay | Admitting: Internal Medicine

## 2016-03-29 NOTE — Telephone Encounter (Signed)
Can refill x 6 months   Have her make OV with me   Before runs out . ( wellness with susan  If agrees)

## 2016-03-30 ENCOUNTER — Telehealth: Payer: Self-pay | Admitting: Internal Medicine

## 2016-03-30 MED ORDER — PRAVASTATIN SODIUM 20 MG PO TABS: 20.0000 mg | ORAL_TABLET | Freq: Every day | ORAL | 0 refills | Status: DC

## 2016-03-30 NOTE — Telephone Encounter (Signed)
° °  Pt request refill of the following:   pravastatin (PRAVACHOL) 20 MG tablet   Phamacy:  [facility] mail order

## 2016-03-30 NOTE — Telephone Encounter (Signed)
Misty pt returned your call °

## 2016-03-30 NOTE — Telephone Encounter (Signed)
Clonazepam sent to the pharmacy by fax.  Pravastatin sent by e-scribe (see telephone note).  Left a message for the pt to return my call.  Need to inform her about future appointments.

## 2016-03-30 NOTE — Telephone Encounter (Signed)
Sent to the pharmacy by e-scribe.  See refill request

## 2016-03-31 NOTE — Telephone Encounter (Signed)
Left a message for a return call.

## 2016-04-01 NOTE — Telephone Encounter (Signed)
Please schedule wellness with Manuela Schwartz (if pt agrees) and cpx with Dr. Regis Bill around 6 months from now.

## 2016-04-01 NOTE — Telephone Encounter (Signed)
Pt would like to have a call within the next 45 mins   [phone] (cell phone)

## 2016-04-06 NOTE — Telephone Encounter (Signed)
lmom for pt to call back

## 2016-04-06 NOTE — Telephone Encounter (Signed)
Pt scheduled  

## 2016-04-12 ENCOUNTER — Encounter: Payer: Self-pay | Admitting: Internal Medicine

## 2016-04-12 ENCOUNTER — Ambulatory Visit: Payer: Commercial Managed Care - HMO

## 2016-04-12 ENCOUNTER — Ambulatory Visit (INDEPENDENT_AMBULATORY_CARE_PROVIDER_SITE_OTHER): Payer: Commercial Managed Care - HMO | Admitting: Internal Medicine

## 2016-04-12 VITALS — BP 156/80 | HR 76 | Temp 97.9°F | Ht 60.0 in | Wt 123.4 lb

## 2016-04-12 DIAGNOSIS — Z23 Encounter for immunization: Secondary | ICD-10-CM | POA: Diagnosis not present

## 2016-04-12 DIAGNOSIS — R05 Cough: Secondary | ICD-10-CM

## 2016-04-12 DIAGNOSIS — I10 Essential (primary) hypertension: Secondary | ICD-10-CM | POA: Diagnosis not present

## 2016-04-12 DIAGNOSIS — R053 Chronic cough: Secondary | ICD-10-CM

## 2016-04-12 DIAGNOSIS — Z8701 Personal history of pneumonia (recurrent): Secondary | ICD-10-CM

## 2016-04-12 DIAGNOSIS — J479 Bronchiectasis, uncomplicated: Secondary | ICD-10-CM

## 2016-04-12 MED ORDER — LEVOFLOXACIN 500 MG PO TABS: 500.0000 mg | ORAL_TABLET | Freq: Every day | ORAL | 0 refills | Status: AC
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
## 2016-04-12 NOTE — Patient Instructions (Addendum)
plan antibiotic for poss partly treated flare of  bronchiectasis   And then  Referral to  pulmonary   For   Follow up  Contact us in interim if getting worse or issues .  Your bp is elevated today  Goal if   Below 140 /90 or thereabouts .   Bronchiectasis Bronchiectasis is a condition in which the airways (bronchi) are damaged and widened. This makes it difficult for the lungs to get rid of mucus. As a result, mucus gathers in the airways, and this often leads to lung infections. Infection can cause inflammation in the airways, which may further weaken and damage the bronchi.  CAUSES  Bronchiectasis may be present at birth (congenital) or may develop later in life. Sometimes there is no apparent cause. Some common causes include:  Cystic fibrosis.   Recurrent lung infections (such as pneumonia, tuberculosis, or fungal infections).  Foreign bodies or other blockages in the lungs.  Breathing in fluid, food, or other foreign objects (aspiration). SIGNS AND SYMPTOMS  Common symptoms include:  A daily cough that brings up mucus and lasts for more than 3 weeks.  Frequent lung infections (such as pneumonia, tuberculosis, or fungal infections).  Shortness of breath and wheezing.   Weakness and fatigue. DIAGNOSIS  Various tests may be done to help diagnose bronchiectasis. Tests may include:  Chest X-rays or CT scans.   Breathing tests to help determine how your lungs are working.   Sputum cultures to check for infection.   Blood tests and other tests to check for related diseases or causes, such as cystic fibrosis. TREATMENT  Treatment varies depending on the severity of the condition. Medicines may be given to loosen the mucus to be coughed up (expectorants), to relax the muscles of the air passages (bronchodilators), or to prevent or treat infections (antibiotics). Physical therapy methods may be recommended to help clear mucus from the lungs. For severe cases, surgery may be done  to remove the affected part of the lung. HOME CARE INSTRUCTIONS   Get plenty of rest.   Only take over-the-counter or prescription medicines as directed by your health care provider. If antibiotic medicines were prescribed, take them as directed. Finish them even if you start to feel better.  Avoid sedatives and antihistamines unless otherwise directed by your health care provider. These medicines tend to thicken the mucus in the lungs.   Perform any breathing exercises or techniques to clear the lungs as directed by your health care provider.  Drink enough fluids to keep your urine clear or pale yellow.  Consider using a cold steam vaporizer or humidifier in your room or home to help loosen secretions.   If the cough is worse at night, try sleeping in a semi-upright position in a recliner or using a couple of pillows.   Avoid cigarette smoke and lung irritants. If you smoke, quit.  Stay inside when pollution and ozone levels are high.   Stay current with vaccinations and immunizations.   Follow up with your health care provider as directed.  SEEK MEDICAL CARE IF:  You cough up more thick, discolored mucus (sputum) that is yellow to [place] in color.  You have a fever or persistent symptoms for more than 2-3 days.  You cannot control your cough and are losing sleep. SEEK IMMEDIATE MEDICAL CARE IF:   You cough up blood.   You have chest pain or increasing shortness of breath.   You have pain that is getting worse or is uncontrolled  with medicines.   You have a fever and your symptoms suddenly get worse. MAKE SURE YOU:  Understand these instructions.   Will watch your condition.   Will get help right away if you are not doing well or get worse.     Julie Bonilla , Thank you for taking time to come for your Medicare Wellness Visit. I appreciate your ongoing commitment to your health goals. Please review the following plan we discussed and let me know if I can  assist you in the future.   Please call if you have question regarding your advanced directives  Will have mammogram around 12/ 2017  (Triglycerides are generally sugar and fat combined.)  Your exercise will help your cholesterol levels!   These are the goals we discussed: Goals    . Weight (lb) < 118 lb (53.5 kg)          Lose 4 to 5 lbs;  Lost 2 to 3 lbs;   Fat free or low fat dairy products Fish high in omega-3 acids ( salmon, tuna, trout) Fruits, such as apples, bananas, oranges, pears, prunes Legumes, such as kidney beans, lentils, checkpeas, black-eyed peas and lima beans Vegetables; broccoli, cabbage, carrots Whole grains;   Plant fats are better; decrease "white" foods as pasta, rice, bread and desserts, sugar; Avoid red meat (limiting) palm and coconut oils; sugary foods and beverages  Two nutrients that raise blood chol levels are saturated fats and trans fat; in hydrogenated oils and fats, as stick margarine, baked goods (cookes, cakes, pies, crackers; frosting; and coffee creamers;   Some Fats lower cholesterol: Monounsaturated and polyunsaturated  Avocados Corn, sunflower, and soybean oils Nuts and seeds, such as walnuts Olive, canola, peanut, safflower, and sesame oils Peanut butter Salmon and trout Tofu            This is a list of the screening recommended for you and due dates:  Health Maintenance  Topic Date Due  . Colon Cancer Screening  11/17/2017  . Tetanus Vaccine  03/05/2020  . Flu Shot  Completed  . DEXA scan (bone density measurement)  Completed  . Shingles Vaccine  Completed  . Pneumonia vaccines  Completed   Menopause is a normal process in which your reproductive ability comes to an end. This process happens gradually over a span of months to years, usually between the ages of 3 and 20. Menopause is complete when you have missed 12 consecutive menstrual periods. It is important to talk with your health care provider about some of the most  common conditions that affect postmenopausal women, such as heart disease, cancer, and bone loss (osteoporosis). Adopting a healthy lifestyle and getting preventive care can help to promote your health and wellness. Those actions can also lower your chances of developing some of these common conditions. WHAT SHOULD I KNOW ABOUT MENOPAUSE? During menopause, you may experience a number of symptoms, such as: Moderate-to-severe hot flashes. Night sweats. Decrease in sex drive. Mood swings. Headaches. Tiredness. Irritability. Memory problems. Insomnia. Choosing to treat or not to treat menopausal changes is an individual decision that you make with your health care provider. WHAT SHOULD I KNOW ABOUT HORMONE REPLACEMENT THERAPY AND SUPPLEMENTS? Hormone therapy products are effective for treating symptoms that are associated with menopause, such as hot flashes and night sweats. Hormone replacement carries certain risks, especially as you become older. If you are thinking about using estrogen or estrogen with progestin treatments, discuss the benefits and risks with your health care provider.  WHAT SHOULD I KNOW ABOUT HEART DISEASE AND STROKE? Heart disease, heart attack, and stroke become more likely as you age. This may be due, in part, to the hormonal changes that your body experiences during menopause. These can affect how your body processes dietary fats, triglycerides, and cholesterol. Heart attack and stroke are both medical emergencies. There are many things that you can do to help prevent heart disease and stroke: Have your blood pressure checked at least every 1-2 years. High blood pressure causes heart disease and increases the risk of stroke. If you are 47-43 years old, ask your health care provider if you should take aspirin to prevent a heart attack or a stroke. Do not use any tobacco products, including cigarettes, chewing tobacco, or electronic cigarettes. If you need help quitting, ask  your health care provider. It is important to eat a healthy diet and maintain a healthy weight. Be sure to include plenty of vegetables, fruits, low-fat dairy products, and lean protein. Avoid eating foods that are high in solid fats, added sugars, or salt (sodium). Get regular exercise. This is one of the most important things that you can do for your health. Try to exercise for at least 150 minutes each week. The type of exercise that you do should increase your heart rate and make you sweat. This is known as moderate-intensity exercise. Try to do strengthening exercises at least twice each week. Do these in addition to the moderate-intensity exercise. Know your numbers.Ask your health care provider to check your cholesterol and your blood glucose. Continue to have your blood tested as directed by your health care provider. WHAT SHOULD I KNOW ABOUT CANCER SCREENING? There are several types of cancer. Take the following steps to reduce your risk and to catch any cancer development as early as possible. Breast Cancer Practice breast self-awareness. This means understanding how your breasts normally appear and feel. It also means doing regular breast self-exams. Let your health care provider know about any changes, no matter how small. If you are 57 or older, have a clinician do a breast exam (clinical breast exam or CBE) every year. Depending on your age, family history, and medical history, it may be recommended that you also have a yearly breast X-ray (mammogram). If you have a family history of breast cancer, talk with your health care provider about genetic screening. If you are at high risk for breast cancer, talk with your health care provider about having an MRI and a mammogram every year. Breast cancer (BRCA) gene test is recommended for women who have family members with BRCA-related cancers. Results of the assessment will determine the need for genetic counseling and BRCA1 and for BRCA2  testing. BRCA-related cancers include these types: Breast. This occurs in males or females. Ovarian. Tubal. This may also be called fallopian tube cancer. Cancer of the abdominal or pelvic lining (peritoneal cancer). Prostate. Pancreatic. Cervical, Uterine, and Ovarian Cancer Your health care provider may recommend that you be screened regularly for cancer of the pelvic organs. These include your ovaries, uterus, and vagina. This screening involves a pelvic exam, which includes checking for microscopic changes to the surface of your cervix (Pap test). For women ages 21-65, health care providers may recommend a pelvic exam and a Pap test every three years. For women ages 75-65, they may recommend the Pap test and pelvic exam, combined with testing for human papilloma virus (HPV), every five years. Some types of HPV increase your risk of cervical cancer. Testing for  HPV may also be done on women of any age who have unclear Pap test results. Other health care providers may not recommend any screening for nonpregnant women who are considered low risk for pelvic cancer and have no symptoms. Ask your health care provider if a screening pelvic exam is right for you. If you have had past treatment for cervical cancer or a condition that could lead to cancer, you need Pap tests and screening for cancer for at least 20 years after your treatment. If Pap tests have been discontinued for you, your risk factors (such as having a new sexual partner) need to be reassessed to determine if you should start having screenings again. Some women have medical problems that increase the chance of getting cervical cancer. In these cases, your health care provider may recommend that you have screening and Pap tests more often. If you have a family history of uterine cancer or ovarian cancer, talk with your health care provider about genetic screening. If you have vaginal bleeding after reaching menopause, tell your health care  provider. There are currently no reliable tests available to screen for ovarian cancer. Lung Cancer Lung cancer screening is recommended for adults 44-67 years old who are at high risk for lung cancer because of a history of smoking. A yearly low-dose CT scan of the lungs is recommended if you: Currently smoke. Have a history of at least 30 pack-years of smoking and you currently smoke or have quit within the past 15 years. A pack-year is smoking an average of one pack of cigarettes per day for one year. Yearly screening should: Continue until it has been 15 years since you quit. Stop if you develop a health problem that would prevent you from having lung cancer treatment. Colorectal Cancer This type of cancer can be detected and can often be prevented. Routine colorectal cancer screening usually begins at age 19 and continues through age 32. If you have risk factors for colon cancer, your health care provider may recommend that you be screened at an earlier age. If you have a family history of colorectal cancer, talk with your health care provider about genetic screening. Your health care provider may also recommend using home test kits to check for hidden blood in your stool. A small camera at the end of a tube can be used to examine your colon directly (sigmoidoscopy or colonoscopy). This is done to check for the earliest forms of colorectal cancer. Direct examination of the colon should be repeated every 5-10 years until age 66. However, if early forms of precancerous polyps or small growths are found or if you have a family history or genetic risk for colorectal cancer, you may need to be screened more often. Skin Cancer Check your skin from head to toe regularly. Monitor any moles. Be sure to tell your health care provider: About any new moles or changes in moles, especially if there is a change in a mole's shape or color. If you have a mole that is larger than the size of a pencil  eraser. If any of your family members has a history of skin cancer, especially at a young age, talk with your health care provider about genetic screening. Always use sunscreen. Apply sunscreen liberally and repeatedly throughout the day. Whenever you are outside, protect yourself by wearing long sleeves, pants, a wide-brimmed hat, and sunglasses. WHAT SHOULD I KNOW ABOUT OSTEOPOROSIS? Osteoporosis is a condition in which bone destruction happens more quickly than new bone creation.  After menopause, you may be at an increased risk for osteoporosis. To help prevent osteoporosis or the bone fractures that can happen because of osteoporosis, the following is recommended: If you are 73-55 years old, get at least 1,000 mg of calcium and at least 600 mg of vitamin D per day. If you are older than age 82 but younger than age 48, get at least 1,200 mg of calcium and at least 600 mg of vitamin D per day. If you are older than age 72, get at least 1,200 mg of calcium and at least 800 mg of vitamin D per day. Smoking and excessive alcohol intake increase the risk of osteoporosis. Eat foods that are rich in calcium and vitamin D, and do weight-bearing exercises several times each week as directed by your health care provider. WHAT SHOULD I KNOW ABOUT HOW MENOPAUSE AFFECTS [place]? Depression may occur at any age, but it is more common as you become older. Common symptoms of depression include: Low or sad mood. Changes in sleep patterns. Changes in appetite or eating patterns. Feeling an overall lack of motivation or enjoyment of activities that you previously enjoyed. Frequent crying spells. Talk with your health care provider if you think that you are experiencing depression. WHAT SHOULD I KNOW ABOUT IMMUNIZATIONS? It is important that you get and maintain your immunizations. These include: Tetanus, diphtheria, and pertussis (Tdap) booster vaccine. Influenza every year before the flu season  begins. Pneumonia vaccine. Shingles vaccine. Your health care provider may also recommend other immunizations.   This information is not intended to replace advice given to you by your health care provider. Make sure you discuss any questions you have with your health care provider.   Document Released: 07/15/2005 Document Revised: 06/13/2014 Document Reviewed: 01/23/2014 Elsevier Interactive Patient Education [employer].     This information is not intended to replace advice given to you by your health care provider. Make sure you discuss any questions you have with your health care provider.   Document Released: 03/20/2007 Document Revised: 05/28/2013 Document Reviewed: 11/28/2012 Elsevier Interactive Patient Education [employer].
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
## 2016-05-11 ENCOUNTER — Other Ambulatory Visit: Payer: Self-pay | Admitting: Internal Medicine

## 2016-05-13 NOTE — Telephone Encounter (Signed)
Sent to the pharmacy by e-scribe for 6 months.  Pt has upcoming cpx on 10/12/16

## 2016-05-17 ENCOUNTER — Other Ambulatory Visit: Payer: Self-pay | Admitting: Internal Medicine

## 2016-05-19 ENCOUNTER — Ambulatory Visit (INDEPENDENT_AMBULATORY_CARE_PROVIDER_SITE_OTHER): Payer: Commercial Managed Care - HMO | Admitting: Emergency Medicine

## 2016-05-19 ENCOUNTER — Encounter: Payer: Self-pay | Admitting: Emergency Medicine

## 2016-05-19 DIAGNOSIS — R05 Cough: Secondary | ICD-10-CM | POA: Diagnosis not present

## 2016-05-19 DIAGNOSIS — J479 Bronchiectasis, uncomplicated: Secondary | ICD-10-CM

## 2016-05-19 DIAGNOSIS — R053 Chronic cough: Secondary | ICD-10-CM

## 2016-05-19 MED ORDER — FLUTICASONE PROPIONATE 50 MCG/ACT NA SUSP: 2.0000 | Freq: Every day | NASAL | 0 refills | Status: DC
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
## 2016-05-19 NOTE — Telephone Encounter (Signed)
Sent to the pharmacy by e-scribe.  Pt has upcoming cpx on 10/12/16.

## 2016-05-19 NOTE — Assessment & Plan Note (Addendum)
She carries a history of bronchiectasis but much of her current syndrome is consistent with an upper airway irritation syndrome. Question whether there are influences of allergic rhinitis and GERD. She has been treated for GERD in the past but is not on therapy currently. Would like to assess her bronchiectasis with a repeat CT, empirically start therapy for allergic rhinitis and GERD. For interval improvement. Depending on her CT scan and her response to therapy we may decide to perform bronchoscopy. We will also consider repeat pulmonary function testing depending on her progress. Her most recent was in 2013.

## 2016-05-19 NOTE — Addendum Note (Signed)
Addended by: Benson Setting L on: 05/19/2016 12:41 PM   Modules accepted: Orders

## 2016-05-19 NOTE — Patient Instructions (Addendum)
We will repeat your CT scan of the chest to compare with priors. Please restart your ranitidine twice a day  We will start loratadine 10mg  daily We will start fluticasone nasal spray 2 sprays each side daily.  Depending on how your cough does we may decide to change your losartan to an alternative  Follow with Dr Lamonte Sakai in 1-2 months to assess your progress. If cough continues then we will discuss whether you need any further testing

## 2016-05-19 NOTE — Assessment & Plan Note (Signed)
Noted on CT chest from 2005. She needs a repeat CT scan of the chest to further assess. Will obtain resp cx data if possible.

## 2016-05-19 NOTE — Addendum Note (Signed)
Addended by: Benson Setting L on: 05/19/2016 12:34 PM   Modules accepted: Orders

## 2016-05-24 ENCOUNTER — Ambulatory Visit (INDEPENDENT_AMBULATORY_CARE_PROVIDER_SITE_OTHER)
Admission: RE | Admit: 2016-05-24 | Discharge: 2016-05-24 | Disposition: A | Discharge status: Home / Self Care | Payer: Commercial Managed Care - HMO | Source: Ambulatory Visit | Attending: Emergency Medicine | Admitting: Emergency Medicine

## 2016-05-24 DIAGNOSIS — J479 Bronchiectasis, uncomplicated: Secondary | ICD-10-CM | POA: Diagnosis not present

## 2016-06-03 LAB — HM MAMMOGRAPHY

## 2016-06-07 ENCOUNTER — Encounter: Payer: Self-pay | Admitting: Family Medicine

## 2016-06-28 ENCOUNTER — Ambulatory Visit: Payer: Commercial Managed Care - HMO | Admitting: Emergency Medicine

## 2016-06-30 ENCOUNTER — Encounter: Payer: Self-pay | Admitting: Emergency Medicine

## 2016-06-30 ENCOUNTER — Encounter (INDEPENDENT_AMBULATORY_CARE_PROVIDER_SITE_OTHER): Payer: Self-pay

## 2016-06-30 ENCOUNTER — Ambulatory Visit (INDEPENDENT_AMBULATORY_CARE_PROVIDER_SITE_OTHER): Payer: PPO | Admitting: Emergency Medicine

## 2016-06-30 DIAGNOSIS — J479 Bronchiectasis, uncomplicated: Secondary | ICD-10-CM

## 2016-06-30 DIAGNOSIS — R053 Chronic cough: Secondary | ICD-10-CM

## 2016-06-30 DIAGNOSIS — R05 Cough: Secondary | ICD-10-CM

## 2016-06-30 NOTE — Assessment & Plan Note (Signed)
Chronic cough that appears to have responded some to addition of GERD therapy and rhinitis therapy. I would like for her to stay on both of these. Her bronchiectasis has progressed some based on her most recent CT scan of the chest. Given her improvement clinically I will defer bronchoscopy at this time. It may be necessary to do so for cx data as she is at risk for atypical infxn.

## 2016-06-30 NOTE — Patient Instructions (Signed)
Please continue your ranitidine twice a day as ordered Continue fluticasone nasal spray and loratadine Your CT scan of the chest shows inflammation in your left lung called bronchiectasis. This can cause cough and can be associated with atypical infections or pneumonia. If your cough or mucous production worsen then we may need to consider a procedure called bronchoscopy to get cultures from the lung.  Follow with Dr Lamonte Sakai in 6 months or sooner if you have any problems

## 2016-06-30 NOTE — Assessment & Plan Note (Signed)
More prominent particularly in the left lower lobe. Certainly she could be colonized with atypical mycobacterial organism. By bronchoscopy for culture data in the future. I will defer for now and plan to repeat her CT scan in 6-12 months depending on her clinical status.
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
## 2016-07-07 DIAGNOSIS — L820 Inflamed seborrheic keratosis: Secondary | ICD-10-CM | POA: Diagnosis not present

## 2016-07-08 DIAGNOSIS — Z961 Presence of intraocular lens: Secondary | ICD-10-CM | POA: Diagnosis not present

## 2016-07-08 DIAGNOSIS — H52203 Unspecified astigmatism, bilateral: Secondary | ICD-10-CM | POA: Diagnosis not present

## 2016-07-18 ENCOUNTER — Telehealth: Payer: Self-pay | Admitting: Internal Medicine

## 2016-07-18 MED ORDER — DULOXETINE HCL 60 MG PO CPEP: ORAL_CAPSULE | ORAL | 0 refills | Status: DC

## 2016-07-18 MED ORDER — LOSARTAN POTASSIUM 50 MG PO TABS: 50.0000 mg | ORAL_TABLET | Freq: Every day | ORAL | 0 refills | Status: DC

## 2016-07-18 NOTE — Telephone Encounter (Signed)
Sent to the pharmacy by e-scribe.  Pt has upcoming yearly on 10-12-16.

## 2016-07-18 NOTE — Telephone Encounter (Addendum)
Pt request refill   losartan (COZAAR) 50 MG tablet DULoxetine (CYMBALTA) 60 MG capsule 90 day  Pt has new insurance and new mail order so needs new rx [facility] Fax: [phone]   Pt will not use [facility] mail[facility] anymore

## 2016-07-21 ENCOUNTER — Other Ambulatory Visit: Payer: Self-pay | Admitting: Emergency Medicine

## 2016-08-24 ENCOUNTER — Other Ambulatory Visit: Payer: PPO

## 2016-08-24 DIAGNOSIS — L570 Actinic keratosis: Secondary | ICD-10-CM | POA: Diagnosis not present

## 2016-08-24 DIAGNOSIS — L650 Telogen effluvium: Secondary | ICD-10-CM | POA: Diagnosis not present

## 2016-08-25 DIAGNOSIS — L650 Telogen effluvium: Secondary | ICD-10-CM | POA: Diagnosis not present

## 2016-08-25 LAB — CBC AND DIFFERENTIAL
HCT: 40 % (ref 36–46)
Hemoglobin: 13 g/dL (ref 12.0–16.0)
Neutrophils Absolute: 4 /uL
Platelets: 284 10*3/uL (ref 150–399)
WBC: 7.7 10*3/mL

## 2016-08-25 LAB — BASIC METABOLIC PANEL
BUN: 19 mg/dL (ref 4–21)
CREATININE: 0.7 mg/dL (ref 0.5–1.1)
Glucose: 111 mg/dL
Potassium: 4.8 mmol/L (ref 3.4–5.3)
Sodium: 139 mmol/L (ref 137–147)

## 2016-08-25 LAB — HEPATIC FUNCTION PANEL
ALK PHOS: 63 U/L (ref 25–125)
ALT: 16 U/L (ref 7–35)
AST: 19 U/L (ref 13–35)
BILIRUBIN, TOTAL: 0.7 mg/dL

## 2016-08-31 ENCOUNTER — Encounter: Payer: Self-pay | Admitting: Family Medicine

## 2016-09-12 ENCOUNTER — Other Ambulatory Visit: Payer: Self-pay | Admitting: Emergency Medicine

## 2016-09-12 ENCOUNTER — Telehealth: Payer: Self-pay | Admitting: Internal Medicine

## 2016-09-12 MED ORDER — CLONAZEPAM 0.5 MG PO TABS: ORAL_TABLET | ORAL | 0 refills | Status: DC

## 2016-09-12 NOTE — Telephone Encounter (Signed)
Ok to refill x 1  She has upcoming appt in MAY with me

## 2016-09-12 NOTE — Telephone Encounter (Signed)
Pt needs new rx clonazepam 0.5 mg #180 sent to [facility]

## 2016-09-23 MED ORDER — CLONAZEPAM 0.5 MG PO TABS: ORAL_TABLET | ORAL | 0 refills | Status: DC

## 2016-09-23 NOTE — Telephone Encounter (Signed)
Medication has been faxed [facility]

## 2016-09-30 DIAGNOSIS — M1711 Unilateral primary osteoarthritis, right knee: Secondary | ICD-10-CM | POA: Diagnosis not present

## 2016-10-12 ENCOUNTER — Encounter: Payer: PPO | Admitting: Internal Medicine

## 2016-10-13 ENCOUNTER — Ambulatory Visit: Payer: PPO | Admitting: Emergency Medicine

## 2016-10-25 DIAGNOSIS — L638 Other alopecia areata: Secondary | ICD-10-CM | POA: Diagnosis not present

## 2016-10-25 DIAGNOSIS — L570 Actinic keratosis: Secondary | ICD-10-CM | POA: Diagnosis not present

## 2016-10-25 DIAGNOSIS — D225 Melanocytic nevi of trunk: Secondary | ICD-10-CM | POA: Diagnosis not present

## 2016-10-25 DIAGNOSIS — Z85828 Personal history of other malignant neoplasm of skin: Secondary | ICD-10-CM | POA: Diagnosis not present

## 2016-10-25 DIAGNOSIS — L821 Other seborrheic keratosis: Secondary | ICD-10-CM | POA: Diagnosis not present

## 2016-11-15 ENCOUNTER — Other Ambulatory Visit: Payer: Self-pay | Admitting: Emergency Medicine

## 2016-11-15 ENCOUNTER — Telehealth: Payer: Self-pay | Admitting: Internal Medicine

## 2016-11-15 MED ORDER — LOSARTAN POTASSIUM 50 MG PO TABS: 50.0000 mg | ORAL_TABLET | Freq: Every day | ORAL | 0 refills | Status: DC

## 2016-11-15 MED ORDER — PRAVASTATIN SODIUM 20 MG PO TABS: 20.0000 mg | ORAL_TABLET | Freq: Every day | ORAL | 0 refills | Status: DC

## 2016-11-15 NOTE — Telephone Encounter (Signed)
Pt request refill  losartan (COZAAR) 50 MG tablet  pravastatin (PRAVACHOL) 20 MG tablet   [facility] - [place], [place]

## 2016-11-15 NOTE — Telephone Encounter (Signed)
Prescriptions has been sent in  
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
## 2016-11-23 ENCOUNTER — Ambulatory Visit (INDEPENDENT_AMBULATORY_CARE_PROVIDER_SITE_OTHER): Payer: PPO | Admitting: Internal Medicine

## 2016-11-23 ENCOUNTER — Encounter: Payer: Self-pay | Admitting: Internal Medicine

## 2016-11-23 VITALS — BP 150/80 | HR 58 | Temp 98.1°F | Ht 60.0 in | Wt 123.8 lb

## 2016-11-23 DIAGNOSIS — K219 Gastro-esophageal reflux disease without esophagitis: Secondary | ICD-10-CM | POA: Diagnosis not present

## 2016-11-23 DIAGNOSIS — J479 Bronchiectasis, uncomplicated: Secondary | ICD-10-CM | POA: Diagnosis not present

## 2016-11-23 DIAGNOSIS — R252 Cramp and spasm: Secondary | ICD-10-CM

## 2016-11-23 DIAGNOSIS — R413 Other amnesia: Secondary | ICD-10-CM | POA: Diagnosis not present

## 2016-11-23 DIAGNOSIS — I10 Essential (primary) hypertension: Secondary | ICD-10-CM

## 2016-11-23 DIAGNOSIS — Z0000 Encounter for general adult medical examination without abnormal findings: Secondary | ICD-10-CM | POA: Diagnosis not present

## 2016-11-23 DIAGNOSIS — E785 Hyperlipidemia, unspecified: Secondary | ICD-10-CM | POA: Diagnosis not present

## 2016-11-23 DIAGNOSIS — G4762 Sleep related leg cramps: Secondary | ICD-10-CM | POA: Diagnosis not present

## 2016-11-23 DIAGNOSIS — H9190 Unspecified hearing loss, unspecified ear: Secondary | ICD-10-CM

## 2016-11-23 DIAGNOSIS — G609 Hereditary and idiopathic neuropathy, unspecified: Secondary | ICD-10-CM

## 2016-11-23 DIAGNOSIS — Z79899 Other long term (current) drug therapy: Secondary | ICD-10-CM | POA: Diagnosis not present

## 2016-11-23 DIAGNOSIS — F909 Attention-deficit hyperactivity disorder, unspecified type: Secondary | ICD-10-CM | POA: Diagnosis not present

## 2016-11-23 LAB — BASIC METABOLIC PANEL
BUN: 16 mg/dL (ref 6–23)
CO2: 29 meq/L (ref 19–32)
Calcium: 9.6 mg/dL (ref 8.4–10.5)
Chloride: 104 mEq/L (ref 96–112)
Creatinine, Ser: 0.73 mg/dL (ref 0.40–1.20)
GFR: 82.31 mL/min (ref >60.00)
Glucose, Bld: 98 mg/dL (ref 70–99)
Potassium: 4.7 mEq/L (ref 3.5–5.1)
SODIUM: 140 meq/L (ref 135–145)

## 2016-11-23 LAB — CK: Total CK: 74 U/L (ref 7–177)

## 2016-11-23 LAB — SEDIMENTATION RATE: SED RATE: 1 mm/h (ref 0–30)

## 2016-11-23 LAB — LIPID PANEL
CHOL/HDL RATIO: 4
CHOLESTEROL: 291 mg/dL — AB (ref 0–200)
HDL: 69.2 mg/dL (ref >39.00)
LDL CALC: 196 mg/dL — AB (ref 0–99)
NonHDL: 221.48
TRIGLYCERIDES: 128 mg/dL (ref 0.0–149.0)
VLDL: 25.6 mg/dL (ref 0.0–40.0)

## 2016-11-23 LAB — TSH: TSH: 1.23 u[IU]/mL (ref 0.35–4.50)

## 2016-11-23 LAB — MAGNESIUM: Magnesium: 2.3 mg/dL (ref 1.5–2.5)

## 2016-11-23 NOTE — Patient Instructions (Addendum)
Will notify you  of labs when available.  Continue    stretching   And hydration for leg cramps      Check bp readings 2 x per day for 3 days and if not at goal 140 contact us and plan fu  To  Address  bp control  important to brain health.   Cause of concern about memory and prev dx of add  Will get neuro consult. optinos interventions   ? If a candidate for neuro psych testing   Plan follow up depending on labs and  6 months  Earlier if  bp not at goal or other issues   Leg Cramps Leg cramps occur when a muscle or muscles tighten and you have no control over this tightening (involuntary muscle contraction). Muscle cramps can develop in any muscle, but the most common place is in the calf muscles of the leg. Those cramps can occur during exercise or when you are at rest. Leg cramps are painful, and they may last for a few seconds to a few minutes. Cramps may return several times before they finally stop. Usually, leg cramps are not caused by a serious medical problem. In many cases, the cause is not known. Some common causes include:  Overexertion.  Overuse from repetitive motions, or doing the same thing over and over.  Remaining in a certain position for a long period of time.  Improper preparation, form, or technique while performing a sport or an activity.  Dehydration.  Injury.  Side effects of some medicines.  Abnormally low levels of the salts and ions in your blood (electrolytes), especially potassium and calcium. These levels could be low if you are taking water pills (diuretics) or if you are pregnant.  Follow these instructions at home: Watch your condition for any changes. Taking the following actions may help to lessen any discomfort that you are feeling:  Stay well-hydrated. Drink enough fluid to keep your urine clear or pale yellow.  Try massaging, stretching, and relaxing the affected muscle. Do this for several minutes at a time.  For tight or tense muscles, use  a warm towel, heating pad, or hot shower water directed to the affected area.  If you are sore or have pain after a cramp, applying ice to the affected area may relieve discomfort. ? Put ice in a plastic bag. ? Place a towel between your skin and the bag. ? Leave the ice on for 20 minutes, 2-3 times per day.  Avoid strenuous exercise for several days if you have been having frequent leg cramps.  Make sure that your diet includes the essential minerals for your muscles to work normally.  Take medicines only as directed by your health care provider.  Contact a health care provider if:  Your leg cramps get more severe or more frequent, or they do not improve over time.  Your foot becomes cold, numb, or blue. This information is not intended to replace advice given to you by your health care provider. Make sure you discuss any questions you have with your health care provider. Document Released: 06/30/2004 Document Revised: 10/29/2015 Document Reviewed: 04/30/2014 Elsevier Interactive Patient Education  2018 [place] 76 Years and Older, Female Preventive care refers to lifestyle choices and visits with your health care provider that can promote health and wellness. What does preventive care include?  A yearly physical exam. This is also called an annual well check.  Dental exams once or  twice a year.  Routine eye exams. Ask your health care provider how often you should have your eyes checked.  Personal lifestyle choices, including: ? Daily care of your teeth and gums. ? Regular physical activity. ? Eating a healthy diet. ? Avoiding tobacco and drug use. ? Limiting alcohol use. ? Practicing safe sex. ? Taking low-dose aspirin every day. ? Taking vitamin and mineral supplements as recommended by your health care provider. What happens during an annual well check? The services and screenings done by your health care provider during your annual well  check will depend on your age, overall health, lifestyle risk factors, and family history of disease. Counseling Your health care provider may ask you questions about your:  Alcohol use.  Tobacco use.  Drug use.  Emotional well-being.  Home and relationship well-being.  Sexual activity.  Eating habits.  History of falls.  Memory and ability to understand (cognition).  Work and work Statistician.  Reproductive health.  Screening You may have the following tests or measurements:  Height, weight, and BMI.  Blood pressure.  Lipid and cholesterol levels. These may be checked every 5 years, or more frequently if you are over 76 years old.  Skin check.  Lung cancer screening. You may have this screening every year starting at age 76 if you have a 30-pack-year history of smoking and currently smoke or have quit within the past 15 years.  Fecal occult blood test (FOBT) of the stool. You may have this test every year starting at age 76.  Flexible sigmoidoscopy or colonoscopy. You may have a sigmoidoscopy every 5 years or a colonoscopy every 10 years starting at age 76.  Hepatitis C blood test.  Hepatitis B blood test.  Sexually transmitted disease (STD) testing.  Diabetes screening. This is done by checking your blood sugar (glucose) after you have not eaten for a while (fasting). You may have this done every 1-3 years.  Bone density scan. This is done to screen for osteoporosis. You may have this done starting at age 76.  Mammogram. This may be done every 1-2 years. Talk to your health care provider about how often you should have regular mammograms.  Talk with your health care provider about your test results, treatment options, and if necessary, the need for more tests. Vaccines Your health care provider may recommend certain vaccines, such as:  Influenza vaccine. This is recommended every year.  Tetanus, diphtheria, and acellular pertussis (Tdap, Td) vaccine. You  may need a Td booster every 10 years.  Varicella vaccine. You may need this if you have not been vaccinated.  Zoster vaccine. You may need this after age 76.  Measles, mumps, and rubella (MMR) vaccine. You may need at least one dose of MMR if you were born in 1957 or later. You may also need a second dose.  Pneumococcal 13-valent conjugate (PCV13) vaccine. One dose is recommended after age 7.  Pneumococcal polysaccharide (PPSV23) vaccine. One dose is recommended after age 73.  Meningococcal vaccine. You may need this if you have certain conditions.  Hepatitis A vaccine. You may need this if you have certain conditions or if you travel or work in places where you may be exposed to hepatitis A.  Hepatitis B vaccine. You may need this if you have certain conditions or if you travel or work in places where you may be exposed to hepatitis B.  Haemophilus influenzae type b (Hib) vaccine. You may need this if you have certain conditions.  Talk  to your health care provider about which screenings and vaccines you need and how often you need them. This information is not intended to replace advice given to you by your health care provider. Make sure you discuss any questions you have with your health care provider. Document Released: 06/19/2015 Document Revised: 02/10/2016 Document Reviewed: 03/24/2015 Elsevier Interactive Patient Education  2017 [employer].

## 2016-11-24 ENCOUNTER — Encounter: Payer: Self-pay | Admitting: Neurology

## 2016-11-25 ENCOUNTER — Other Ambulatory Visit: Payer: Self-pay | Admitting: Emergency Medicine

## 2016-11-25 DIAGNOSIS — E785 Hyperlipidemia, unspecified: Secondary | ICD-10-CM

## 2016-11-25 MED ORDER — EZETIMIBE 10 MG PO TABS: 10.0000 mg | ORAL_TABLET | Freq: Every day | ORAL | 3 refills | Status: DC

## 2016-11-29 ENCOUNTER — Ambulatory Visit (INDEPENDENT_AMBULATORY_CARE_PROVIDER_SITE_OTHER): Payer: PPO | Admitting: Neurology

## 2016-11-29 ENCOUNTER — Other Ambulatory Visit: Payer: PPO

## 2016-11-29 ENCOUNTER — Encounter: Payer: Self-pay | Admitting: Neurology

## 2016-11-29 VITALS — BP 144/86 | HR 68 | Ht 60.0 in | Wt 123.0 lb

## 2016-11-29 DIAGNOSIS — R413 Other amnesia: Secondary | ICD-10-CM

## 2016-11-29 DIAGNOSIS — R41840 Attention and concentration deficit: Secondary | ICD-10-CM

## 2016-11-29 NOTE — Patient Instructions (Signed)
1. Schedule MRI brain without contrast 2. Bloodwork for B12 3. Discuss starting ADD medication with Dr. Regis Bill 4. If attention difficulties are unchanged with medication, we will schedule Neurocognitive testing 5. Follow-up in 6 months, call for any changes
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
## 2016-11-30 ENCOUNTER — Telehealth: Payer: Self-pay

## 2016-11-30 LAB — VITAMIN B12: Vitamin B-12: 335 pg/mL (ref 200–1100)

## 2016-11-30 NOTE — Telephone Encounter (Signed)
LMOM asking pt to return my call to relay message below 

## 2016-11-30 NOTE — Telephone Encounter (Signed)
-----   Message from Cameron Sprang, MD sent at 11/30/2016 12:03 PM EDT ----- pls let her know B12 level is normal, thanks

## 2016-12-01 ENCOUNTER — Telehealth: Payer: Self-pay

## 2016-12-01 ENCOUNTER — Telehealth: Payer: Self-pay | Admitting: Neurology

## 2016-12-01 NOTE — Telephone Encounter (Signed)
Patient is returning your call according to the voice mail

## 2016-12-01 NOTE — Telephone Encounter (Signed)
Returned pt call to let her know that B12 levels are normal. No answer.  Answering machine hung up on me was unable to relay message

## 2016-12-02 NOTE — Telephone Encounter (Signed)
Pt returned call.  Relayed recent b12 lab results.  Pt appreciative.  She asked about scheduling her MRI, let her know that [facility] should be calling her and scheduling, gave her their number in case she doesn't hear from them by next week.

## 2016-12-04 ENCOUNTER — Encounter: Payer: Self-pay | Admitting: Neurology

## 2016-12-04 DIAGNOSIS — R41840 Attention and concentration deficit: Secondary | ICD-10-CM | POA: Insufficient documentation

## 2016-12-04 DIAGNOSIS — R413 Other amnesia: Secondary | ICD-10-CM | POA: Insufficient documentation

## 2016-12-13 ENCOUNTER — Telehealth: Payer: Self-pay | Admitting: Internal Medicine

## 2016-12-13 ENCOUNTER — Other Ambulatory Visit: Payer: Self-pay | Admitting: Emergency Medicine

## 2016-12-13 NOTE — Telephone Encounter (Signed)
Pt needs refill on duloxetine 60 mg #90 w/refills send to [facility]

## 2016-12-13 NOTE — Telephone Encounter (Signed)
Patient would like a refill for Cymbalta read and your last note that you wanted patient to start taking 1 tab po daily instead of 2. Would like the rx to stay the same or change the directions? Please advise.

## 2016-12-15 NOTE — Telephone Encounter (Signed)
Tell her  We have to change to sig  To once a day  If that is what she is now taking .

## 2016-12-16 MED ORDER — DULOXETINE HCL 60 MG PO CPEP: ORAL_CAPSULE | ORAL | 0 refills | Status: DC

## 2016-12-16 NOTE — Telephone Encounter (Signed)
Spoke with patient is she clarified that she is taking 1 tab po daily. Medication has been sent in

## 2016-12-16 NOTE — Telephone Encounter (Signed)
Spoke with patient and she states that she is taking 1 tab po daily.

## 2016-12-19 ENCOUNTER — Ambulatory Visit
Admission: RE | Admit: 2016-12-19 | Discharge: 2016-12-19 | Disposition: A | Discharge status: Home / Self Care | Payer: PPO | Source: Ambulatory Visit | Attending: Neurology | Admitting: Neurology

## 2016-12-19 DIAGNOSIS — R413 Other amnesia: Secondary | ICD-10-CM

## 2016-12-19 DIAGNOSIS — R41840 Attention and concentration deficit: Secondary | ICD-10-CM

## 2016-12-21 ENCOUNTER — Telehealth: Payer: Self-pay | Admitting: Internal Medicine

## 2016-12-21 ENCOUNTER — Telehealth: Payer: Self-pay

## 2016-12-21 NOTE — Telephone Encounter (Signed)
Spoke with pt relaying message below.   

## 2016-12-21 NOTE — Telephone Encounter (Signed)
° ° ° °  [facility] call to say pt  was taking 2 tablets twice a day  and the new  RX is for 1 tablet a day  And they would like to clarify this    DULoxetine (CYMBALTA) 60 MG capsule

## 2016-12-21 NOTE — Telephone Encounter (Signed)
-----   Message from Cameron Sprang, MD sent at 12/20/2016  2:08 PM EDT ----- Pls let patient know I reviewed MRI brain, no evidence of tumor, stroke, or bleed. It shows age-related changes. It also shows sinusitis, if this is bothersome for her, pls have her call PCP. Thanks

## 2016-12-23 ENCOUNTER — Other Ambulatory Visit: Payer: Self-pay | Admitting: Emergency Medicine

## 2016-12-23 ENCOUNTER — Ambulatory Visit (INDEPENDENT_AMBULATORY_CARE_PROVIDER_SITE_OTHER): Payer: PPO | Admitting: Internal Medicine

## 2016-12-23 ENCOUNTER — Encounter: Payer: Self-pay | Admitting: Internal Medicine

## 2016-12-23 VITALS — BP 130/70 | HR 73 | Temp 97.9°F | Wt 124.8 lb

## 2016-12-23 DIAGNOSIS — Z79899 Other long term (current) drug therapy: Secondary | ICD-10-CM | POA: Diagnosis not present

## 2016-12-23 DIAGNOSIS — J329 Chronic sinusitis, unspecified: Secondary | ICD-10-CM | POA: Insufficient documentation

## 2016-12-23 MED ORDER — DULOXETINE HCL 60 MG PO CPEP: ORAL_CAPSULE | ORAL | 0 refills | Status: DC

## 2016-12-23 NOTE — Telephone Encounter (Signed)
Spoke with the patient to verify that she is taking one tab mouth daily and not 2. Called in [facility] and spoke with Linna Hoff the pharmacist and clarified that patient is taking 1 tab daily. Patient would like a 30 day supply sent to [facility] until she receive medication from [facility]. Patient is also having some sinus issues so she has been scheduled 12/23/2016 at 300 pm
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
## 2016-12-23 NOTE — Telephone Encounter (Signed)
Tammy from [facility] is calling to see if they can please get that clarification on the medication.

## 2016-12-23 NOTE — Patient Instructions (Signed)
  Your MRI scan was consistent with chronic sinusitis This is usually a management condition with nasal saline nasal cortisone you have started Sometimes nasal irrigation with saline is helpful to avoid acute infection. If you're getting increasing pain and infection looking an antibiotic sometimes can help. At this time with your symptoms being minimal I don't see a reason to go to an ear nose and throat doctor. You can also get the opinion from Dr. Lamonte Sakai.   If getting fever increase pain and pressure or blood then contact medical team .

## 2016-12-27 ENCOUNTER — Encounter: Payer: Self-pay | Admitting: Emergency Medicine

## 2016-12-27 ENCOUNTER — Ambulatory Visit (INDEPENDENT_AMBULATORY_CARE_PROVIDER_SITE_OTHER): Payer: PPO | Admitting: Emergency Medicine

## 2016-12-27 DIAGNOSIS — J301 Allergic rhinitis due to pollen: Secondary | ICD-10-CM

## 2016-12-27 DIAGNOSIS — J479 Bronchiectasis, uncomplicated: Secondary | ICD-10-CM

## 2016-12-27 DIAGNOSIS — K219 Gastro-esophageal reflux disease without esophagitis: Secondary | ICD-10-CM

## 2016-12-27 DIAGNOSIS — J309 Allergic rhinitis, unspecified: Secondary | ICD-10-CM | POA: Insufficient documentation

## 2016-12-27 NOTE — Assessment & Plan Note (Signed)
Continue ranitidine once a day. Consider increasing back to twice a day if she develops progression of symptoms.

## 2016-12-27 NOTE — Patient Instructions (Signed)
Please continue your loratadine, ranitidine, and fluticasone nasal spray as you have been using them  Get the flu shot in the fall Follow with Dr Lamonte Sakai in 6 months or sooner if you have any problems Depending on your symptoms, cough, we will decide as we go forward whether you need any new testing including scan of your chest or bronchoscopy. No need to order these tests at this time.

## 2016-12-27 NOTE — Progress Notes (Signed)
Subjective:    Patient ID: Julie Bonilla, female    DOB: 1940-07-13, 76 y.o.   MRN: [id number]  Cough  Pertinent negatives include no ear pain, eye redness, fever, headaches, postnasal drip, rash, rhinorrhea, sore throat, shortness of breath or wheezing.    76 yo never smoker with history of hypertension, allergic rhinitis, GERD, ADHD. She is referred for evaluation by Dr. Zack Bonilla for cough. The cough began about 6 months ago. She was treated with an abx about 2 months ago and it did seem to help the cough. Can sometimes be productive of Julie Bonilla. In retrospect she states that she has had long bouts of coughing before. Bothers her often at bedtime when supine. Has been treated with ranitidine before but not currently. She has persistent nasal gtt and drainage. She takes OTC guaifenesin. Has used OTC allergy meds before. There is a relationship between her voice quality and caffeine, ETOH, chocolate.   She had a remote bronchoscopy by Dr Julie Bonilla > 20 yrs ago for hemoptysis. She reports that there was no clear etiology identified.   CT scan of the chest from 2005 was reviewed by me. This shows some lower lobe bronchiectatic change was some mucous plugs.  She underwent pulmonary function testing 02/2012 that I personally reviewed. This showed normal spirometry with a possibly decreased FEV1 to FVC ratio, Normal lung volumes, decreased diffusion capacity that corrects to the normal range when adjusted for alveolar volume  ROV 06/30/16 -- This follow-up visit for patient with a history of chronic cough in the setting of allergic rhinitis, GERD, and bronchiectasis first noted on CT scan of the chest back in 2005. At her last visit we restarted ranitidine twice a day, started loratadine and fluticasone nasal spray. We also checked a CT scan of the chest that I personally reviewed done on 05/24/16. This shows some progression of LLL bronchiectasis with some dilated fluid filled bronchi, mild RML and L apical  scar. She feels that her cough is slightly better on the ranitidine + flonase + loratadine. She feels that she is moving air better through her nose and head. Only occasionally brings up mucous.   ROV 12/27/16 -- Mrs. Julie Bonilla has a history of chronic cough with bronchiectasis on CT scan, allergic rhinitis, GERD. She's been treated for both GERD and rhinitis with ranitidine qd, loratadine, fluticasone nasal spray. Because her cough has been stable we have deferred bronchoscopy thus far. She describes that her cough is better. Happens rarely and minimally productive. Overall she believes that her breathing is doing well. She is having some sinus fullness and pressure.    Review of Systems  Constitutional: Negative.  Negative for fever and unexpected weight change.  HENT: Positive for sinus pressure. Negative for congestion, dental problem, ear pain, nosebleeds, postnasal drip, rhinorrhea, sneezing, sore throat and trouble swallowing.   Eyes: Negative for redness and itching.  Respiratory: Positive for cough. Negative for chest tightness, shortness of breath and wheezing.   Cardiovascular: Negative.  Negative for palpitations and leg swelling.  Gastrointestinal: Negative.  Negative for nausea and vomiting.  Endocrine: Negative.   Genitourinary: Negative.  Negative for dysuria.  Musculoskeletal: Positive for joint swelling.  Skin: Negative.  Negative for rash.  Allergic/Immunologic: Negative.   Neurological: Negative.  Negative for headaches.  Hematological: Does not bruise/bleed easily.  Psychiatric/Behavioral: Negative.  Negative for dysphoric mood. The patient is not nervous/anxious.         Objective:   Physical Exam Vitals:   12/27/16 1218  BP: 128/76  Pulse: 70  SpO2: 97%  Weight: 123 lb (55.8 kg)  Height: 5' (1.524 m)   Gen: Pleasant, well-nourished, in no distress,  normal affect  ENT: No lesions,  mouth clear,  oropharynx clear, no postnasal drip  Neck: No JVD, no TMG, no  carotid bruits  Lungs: No use of accessory muscles, no dullness to percussion, clear without rales or rhonchi  Cardiovascular: RRR, heart sounds normal, no murmur or gallops, no peripheral edema  Musculoskeletal: No deformities, no cyanosis or clubbing  Neuro: alert, non focal  Skin: Warm, no lesions or rashes     CT chest 05/24/16 --  COMPARISON:  01/19/2016, CT 04/08/2004  FINDINGS: Cardiovascular: Limited evaluation without the presence of intravenous contrast. Mild atherosclerotic calcification of the aorta. There is no aneurysm. Heart size is nonenlarged. No pericardial effusion.  Mediastinum/Nodes: Trachea is midline. Thyroid gland within normal limits. No suspiciously enlarged mediastinal lymph nodes. Limited evaluation for hilar nodes without contrast. The esophagus unremarkable.  Lungs/Pleura: No acute consolidation, pleural effusion, or pneumothorax is present. There is minimal linear scarring in the apex of the left upper lobe. There is mild scarring in the right middle lobe. Again visualized is bronchiectasis in the left lower lobe, slightly progressed since the prior study. There are a few fluid-filled dilated bronchi present in the left lower lobe. In oval soft tissue density in the left lower lobe is unchanged. A soft tissue density adjacent to the left pulmonary fissure is also unchanged. There is no new suspicious mass.  Upper Abdomen: No acute abnormalities.  Musculoskeletal: Degenerative changes. No acute osseous abnormality.  IMPRESSION: 1. Moderate bronchiectasis in the left lower lobe has progressed since the comparison CT from 2005. Few fluid-filled dilated bronchi are noted. Oval soft tissue nodule in the left lower lobe is unchanged. 2. There is mild scarring in the right middle lobe and apical portion of the left upper lobe.       Assessment & Plan:  GERD Continue ranitidine once a day. Consider increasing back to twice a day if  she develops progression of symptoms.  Allergic rhinitis Appears to be improved on loratadine once a day, fluticasone nasal spray. We will continue these as she is taking them.  BRONCHIECTASIS Radiographical progression based on her most recent CT scan but she is clinically stable. She does not produce mucous. Discussed with her the potential indications for a repeat CT scan or bronchoscopy to perform culture data. We will defer these for now. She knows to inform me if her symptoms change.  Baltazar Apo, MD, PhD 12/27/2016, 12:41 PM  [facility] [phone] or if no answer [phone]

## 2016-12-27 NOTE — Assessment & Plan Note (Signed)
Appears to be improved on loratadine once a day, fluticasone nasal spray. We will continue these as she is taking them.

## 2016-12-27 NOTE — Assessment & Plan Note (Signed)
Radiographical progression based on her most recent CT scan but she is clinically stable. She does not produce mucous. Discussed with her the potential indications for a repeat CT scan or bronchoscopy to perform culture data. We will defer these for now. She knows to inform me if her symptoms change.

## 2017-01-03 ENCOUNTER — Telehealth: Payer: Self-pay | Admitting: Internal Medicine

## 2017-01-03 NOTE — Telephone Encounter (Signed)
If Neurology not  Going to prescribe medication      Then would need     2 slot appt      To discuss .

## 2017-01-03 NOTE — Telephone Encounter (Signed)
Please advise 

## 2017-01-03 NOTE — Telephone Encounter (Signed)
Patient has been scheduled to come in and discuss with Dr. Regis Bill.

## 2017-01-03 NOTE — Telephone Encounter (Signed)
Pt was seen on 12/23/16 and had a referral to see dr Delice Lesch for memory issues. Pt said she does not have memory issue just ADD and would like medication
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
## 2017-01-09 ENCOUNTER — Ambulatory Visit (INDEPENDENT_AMBULATORY_CARE_PROVIDER_SITE_OTHER): Payer: PPO | Admitting: Internal Medicine

## 2017-01-09 ENCOUNTER — Encounter: Payer: Self-pay | Admitting: Internal Medicine

## 2017-01-09 VITALS — BP 130/70 | HR 78 | Temp 98.4°F | Wt 125.5 lb

## 2017-01-09 DIAGNOSIS — H9190 Unspecified hearing loss, unspecified ear: Secondary | ICD-10-CM | POA: Diagnosis not present

## 2017-01-09 DIAGNOSIS — R413 Other amnesia: Secondary | ICD-10-CM

## 2017-01-09 DIAGNOSIS — F988 Other specified behavioral and emotional disorders with onset usually occurring in childhood and adolescence: Secondary | ICD-10-CM

## 2017-01-09 DIAGNOSIS — Z79899 Other long term (current) drug therapy: Secondary | ICD-10-CM | POA: Diagnosis not present

## 2017-01-09 MED ORDER — METHYLPHENIDATE HCL ER (CD) 10 MG PO CPCR: 10.0000 mg | ORAL_CAPSULE | ORAL | 0 refills | Status: DC

## 2017-01-09 NOTE — Patient Instructions (Addendum)
  Stimulant medications     Have some risk as we discussed but in low dose may be helpful  ADD brains functioning.  to help focus and organize.   If you feel getting any side effect concerns let us know.   Plan low dose  And then ROV in about a month.   If your blood pressure  Is above 150 contact us for advice .  Optimize hearing and vision  As you are doing .

## 2017-01-10 ENCOUNTER — Telehealth: Payer: Self-pay | Admitting: Internal Medicine

## 2017-01-10 ENCOUNTER — Other Ambulatory Visit: Payer: Self-pay | Admitting: Emergency Medicine

## 2017-01-10 MED ORDER — CLONAZEPAM 0.5 MG PO TABS: ORAL_TABLET | ORAL | 0 refills | Status: DC

## 2017-01-10 NOTE — Telephone Encounter (Signed)
Prescription has been sent in  

## 2017-01-10 NOTE — Telephone Encounter (Signed)
FYI

## 2017-01-10 NOTE — Telephone Encounter (Signed)
Ok to refill x 1  

## 2017-01-10 NOTE — Telephone Encounter (Signed)
Pt saw dr Regis Bill yesterday and did not realize she needed her clonazePAM (KLONOPIN) 0.5 MG tablet refilled  Please send to  [facility], [place]

## 2017-01-11 ENCOUNTER — Other Ambulatory Visit: Payer: Self-pay

## 2017-01-11 ENCOUNTER — Telehealth: Payer: Self-pay

## 2017-01-11 NOTE — Telephone Encounter (Signed)
Received PA request for Methylphenidate. PA submitted & pending. Key: [id number]

## 2017-01-13 ENCOUNTER — Other Ambulatory Visit: Payer: Self-pay | Admitting: Internal Medicine

## 2017-01-16 ENCOUNTER — Ambulatory Visit: Payer: PPO | Admitting: Neurology

## 2017-01-16 ENCOUNTER — Telehealth: Payer: Self-pay | Admitting: Emergency Medicine

## 2017-01-16 ENCOUNTER — Encounter: Payer: Self-pay | Admitting: Internal Medicine

## 2017-01-16 ENCOUNTER — Ambulatory Visit (INDEPENDENT_AMBULATORY_CARE_PROVIDER_SITE_OTHER): Payer: PPO | Admitting: Internal Medicine

## 2017-01-16 ENCOUNTER — Ambulatory Visit (INDEPENDENT_AMBULATORY_CARE_PROVIDER_SITE_OTHER)
Admission: RE | Admit: 2017-01-16 | Discharge: 2017-01-16 | Disposition: A | Discharge status: Home / Self Care | Payer: PPO | Source: Ambulatory Visit | Attending: Emergency Medicine | Admitting: Emergency Medicine

## 2017-01-16 DIAGNOSIS — R05 Cough: Secondary | ICD-10-CM

## 2017-01-16 DIAGNOSIS — J471 Bronchiectasis with (acute) exacerbation: Secondary | ICD-10-CM

## 2017-01-16 DIAGNOSIS — R059 Cough, unspecified: Secondary | ICD-10-CM

## 2017-01-16 MED ORDER — DOXYCYCLINE HYCLATE 100 MG PO TABS: ORAL_TABLET | ORAL | 0 refills | Status: DC

## 2017-01-16 MED ORDER — LEVOFLOXACIN 500 MG PO TABS: 500.0000 mg | ORAL_TABLET | Freq: Every day | ORAL | 0 refills | Status: DC

## 2017-01-16 NOTE — Telephone Encounter (Signed)
Spoke with pt. States that she thinks she may have PNA. Reports left sided chest pain, coughing with production of Benassi mucus. Denies chest tightness, wheezing, SOB or fever. Symptoms started 3 days ago. Pt has not taken any OTC medications. She would like RB's recommendations.  RB - please advise. Thanks.

## 2017-01-16 NOTE — Patient Instructions (Addendum)
Since there is no visible pneumonia on chest xray, I suggest we use doxycycline instead of levaquin this time.  Script sent for doxycycline and we will cancel the earlier levaquin script  Stay comfortably hydrated, and please let us know if you don't get better  Follow up with Dr Lamonte Sakai as directed.

## 2017-01-16 NOTE — Telephone Encounter (Signed)
Pt scheduled for OV today with CY with CXR prior.  Levaquin 500mg  sent to [facility] per patient request.  Pt aware to show up early for cxr.  Nothing further needed.

## 2017-01-16 NOTE — Telephone Encounter (Signed)
She needs to  - start levaquin 500mg  qd x 10 days.  - have a CXR for cough, r/o PNA - be seen as an acute OV to review CXR and clinical status.

## 2017-01-16 NOTE — Progress Notes (Signed)
01/16/17-76 year old female never smoker followed by Dr. Lamonte Sakai for bronchiectasis which had shown radiologic progression but clinical stability at his last visit. Pt has been couging since 01/13/17 and cough is worse than it has been. Occ prod. cough with Ollis mucus and occ. Helder nasal drainage. Pt has had occ. SOB today, 01/16/17 as well as yesterday 01/15/17. Pt had a cxr done today. Levaquin  and CXR ordered per phone order from Dr Lamonte Sakai earlier today. She has had increased cough now for 4 days. At first said it was due to "acid" and then denied having a reflux event or associated aspiration opportunity. Did have some sore throat but no fever or chills. Has felt a little clammy and lightheaded. Thirsty. GI okay. Cough mostly nonproductive. No nasal discharge. No chest pain. She did not start Levaquin, wanted to be seen today first. CXR 01/16/17 IMPRESSION: 1. No consolidation to suggest pneumonia. 2. Right middle lobe scarring. Streaky left lower lobe opacity consistent corresponds to bronchiectasis on prior CT  ROS-see HPI   + = positive Constitutional:    weight loss, night sweats, fevers, chills, fatigue, lassitude. HEENT:    headaches, difficulty swallowing, tooth/dental problems, sore throat,       sneezing, itching, ear ache, nasal congestion, post nasal drip, snoring CV:    chest pain, orthopnea, PND, swelling in lower extremities, anasarca,                                                         dizziness, palpitations Resp:   shortness of breath with exertion or at rest.                productive cough,   +non-productive cough, coughing up of blood.              change in color of mucus.  wheezing.   Skin:    rash or lesions. GI:  No-   heartburn, indigestion, abdominal pain, nausea, vomiting, diarrhea,                 change in bowel habits, loss of appetite GU: dysuria, change in color of urine, no urgency or frequency.   flank pain. MS:   joint pain, stiffness, decreased range of  motion, back pain. Neuro-     nothing unusual Psych:  change in mood or affect.  depression or anxiety.   memory loss.  OBJ- Physical Exam General- Alert, Oriented, Affect-appropriate, Distress- none acute Skin- rash-none, lesions- none, excoriation- none Lymphadenopathy- none Head- atraumatic            Eyes- Gross vision intact, PERRLA, conjunctivae and secretions clear            Ears- Hearing, canals-normal            Nose- Clear, no-Septal dev, mucus, polyps, erosion, perforation             Throat- Mallampati II , mucosa clear , drainage- none, tonsils- atrophic Neck- flexible , trachea midline, no stridor , thyroid nl, carotid no bruit Chest - symmetrical excursion , unlabored           Heart/CV- RRR , no murmur , no gallop  , no rub, nl s1 s2                           -  JVD- none , edema- none, stasis changes- none, varices- none           Lung- clear to P&A, wheeze- none, cough + deep , dullness-none, rub- none           Chest wall-  Abd-  Br/ Gen/ Rectal- Not done, not indicated Extrem- cyanosis- none, clubbing, none, atrophy- none, strength- nl Neuro- grossly intact to observation

## 2017-01-17 NOTE — Assessment & Plan Note (Signed)
Acute exacerbation with a significant bronchitis component. She has GERD but didn't recognize and associated reflux/aspiration event. Chest x-ray available for review today does not show pneumonia. Because of potential quinolone side effects, in the absence of pneumonia I suggested she start with doxycycline but let us know if she fails to improve quickly.

## 2017-02-01 ENCOUNTER — Telehealth: Payer: Self-pay | Admitting: Emergency Medicine

## 2017-02-01 MED ORDER — BENZONATATE 200 MG PO CAPS: 200.0000 mg | ORAL_CAPSULE | Freq: Four times a day (QID) | ORAL | 0 refills | Status: DC | PRN

## 2017-02-01 MED ORDER — OMEPRAZOLE 20 MG PO CPDR: 20.0000 mg | DELAYED_RELEASE_CAPSULE | Freq: Two times a day (BID) | ORAL | 0 refills | Status: DC

## 2017-02-01 NOTE — Telephone Encounter (Signed)
Please let her know that I would like for her to temporarily add omeprazole 20mg  bid to her regimen for the next 2 weeks to see if this impacts the residual cough  Also, she can have a script for tessalon perles 200mg  q6h prn cough, 0RF if she would like.

## 2017-02-01 NOTE — Telephone Encounter (Signed)
Spoke with the pt  She is c/o persistent cough  She is better since the last visit with CDY on 01/16/17 "was really sick with a cold then", however the cough lingers  She is taking 400 mg of guaifenesin bid and only able to produce minimal light Ginty sputum  No increased SOB, wheezing, f/c/s  She was just here so did not want to schedule visit again so soon  RB- please advise thanks!  Allergies  Allergen Reactions  . Crestor [Rosuvastatin] Other (See Comments)    Myalgia on 5 mg per day  . Erythromycin Nausea And Vomiting    Oral  No hives rash or itching  . Mobic [Meloxicam] Other (See Comments)    Moody and irritable

## 2017-02-01 NOTE — Telephone Encounter (Signed)
Spoke with the pt and notified of recs per RB  She verbalized understanding  I asked her to hold the ranitidine while trying omeprazole  Rxs sent to [facility]

## 2017-02-01 NOTE — Telephone Encounter (Signed)
I looked in patient's chart and it appears the Tessalon was called into [facility] instead of [facility]. New RX has been sent to [facility]. Left a detailed message stating that it had been called for her.

## 2017-02-02 ENCOUNTER — Telehealth: Payer: Self-pay | Admitting: Emergency Medicine

## 2017-02-02 NOTE — Telephone Encounter (Signed)
Pharmacies corrected on chart.  Left detailed message on patients voicemail making aware.  Nothing further needed.

## 2017-02-10 ENCOUNTER — Ambulatory Visit: Payer: PPO | Admitting: Internal Medicine

## 2017-02-13 ENCOUNTER — Telehealth: Payer: Self-pay | Admitting: Internal Medicine

## 2017-02-13 ENCOUNTER — Encounter: Payer: Self-pay | Admitting: Internal Medicine

## 2017-02-13 NOTE — Telephone Encounter (Signed)
Pt need new Rx for gabapentin tid  #90  Pharm:  [facility]

## 2017-02-13 NOTE — Telephone Encounter (Signed)
Patient would like a new rx for Gabapentin. This medication was d/c on 06/30/2016 reason: change in therapy. Please advise.

## 2017-02-14 ENCOUNTER — Telehealth: Payer: Self-pay | Admitting: Emergency Medicine

## 2017-02-14 NOTE — Telephone Encounter (Signed)
Spoke with patient. She received a tessalon RX last month and stated that the medication has helped a lot and she wonders if RB wants her to stay on this medication. If so, she would like a 3 month supply to be called into [facility]. Advised patient that since this RX was called in when she wasn't feeling well, I'd need to ask RB first. She verbalized understanding.   RB, please advise if it ok for [name] to send in a 54mo supply. Thanks!

## 2017-02-15 MED ORDER — BENZONATATE 200 MG PO CAPS: 200.0000 mg | ORAL_CAPSULE | Freq: Four times a day (QID) | ORAL | 1 refills | Status: DC | PRN

## 2017-02-15 NOTE — Telephone Encounter (Signed)
lmtcb X1 for pt.  rx sent to preferred pharmacy.   

## 2017-02-15 NOTE — Telephone Encounter (Signed)
Ok to continue the [employer], refill for 3 month supply

## 2017-02-15 NOTE — Telephone Encounter (Signed)
this was discontinued by Ria Comment lemons cma and I did not even see her on that day. Of discontinuation.   Therefore it was not involved in the medication list change. It is possible she was changed to a different dosing regimen and it came out as a discontinuation.  The medication listhistory is very confusing : therefore  Please call the patient and ask her what she is taking.currently. Then we can refill it.

## 2017-02-16 ENCOUNTER — Other Ambulatory Visit: Payer: Self-pay | Admitting: Internal Medicine

## 2017-02-16 NOTE — Telephone Encounter (Signed)
lmomtcb x 2 for the pt to call back.  

## 2017-02-20 ENCOUNTER — Other Ambulatory Visit: Payer: Self-pay | Admitting: Emergency Medicine

## 2017-02-20 MED ORDER — GABAPENTIN 300 MG PO CAPS: 300.0000 mg | ORAL_CAPSULE | Freq: Two times a day (BID) | ORAL | 1 refills | Status: DC

## 2017-02-20 NOTE — Telephone Encounter (Signed)
Spoke with patient and she states that she is still taking medication  and does not know why it was d/c. Refill has been sent to pharmacy.

## 2017-02-20 NOTE — Telephone Encounter (Signed)
Spoke with pharmacist at [facility] and clarified that pt is to have a 3 mth supply and that # 360 tablets are ok to dispense.  Nothing further needed.

## 2017-02-20 NOTE — Telephone Encounter (Signed)
lmomtcb x 3 for the pt to call back.
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
## 2017-02-23 ENCOUNTER — Ambulatory Visit (INDEPENDENT_AMBULATORY_CARE_PROVIDER_SITE_OTHER): Payer: PPO | Admitting: Internal Medicine

## 2017-02-23 ENCOUNTER — Encounter: Payer: Self-pay | Admitting: Internal Medicine

## 2017-02-23 VITALS — BP 122/70 | HR 69 | Temp 97.6°F | Wt 125.6 lb

## 2017-02-23 DIAGNOSIS — Z79899 Other long term (current) drug therapy: Secondary | ICD-10-CM | POA: Diagnosis not present

## 2017-02-23 DIAGNOSIS — F988 Other specified behavioral and emotional disorders with onset usually occurring in childhood and adolescence: Secondary | ICD-10-CM

## 2017-02-23 DIAGNOSIS — Z23 Encounter for immunization: Secondary | ICD-10-CM | POA: Diagnosis not present

## 2017-02-23 MED ORDER — METHYLPHENIDATE HCL ER (CD) 10 MG PO CPCR: 10.0000 mg | ORAL_CAPSULE | ORAL | 0 refills | Status: DC

## 2017-02-23 MED ORDER — ZOSTER VAC RECOMB ADJUVANTED 50 MCG/0.5ML IM SUSR: 0.5000 mL | Freq: Once | INTRAMUSCULAR | 1 refills | Status: AC

## 2017-02-23 NOTE — Patient Instructions (Addendum)
Take the medicine   No later than 10 am .     If still effecting sleep then we need to stop of change to immediate release medication . Trial.  ( methylphenidate)   Dont want you to increase the clonazepam .      Contact  us or OV in 2 weeks  ...  About  How doing and then decide .  On next step

## 2017-03-03 ENCOUNTER — Telehealth: Payer: Self-pay | Admitting: *Deleted

## 2017-03-03 NOTE — Telephone Encounter (Signed)
Caller name: Lattie Haw w/ [facility]  Reason for call: Gabapentin written for 1 capsule BID, #90. For 3 month supply, should have been written for #180. Verbal orders given to increase to #180 w/ 1 refill.

## 2017-03-09 ENCOUNTER — Other Ambulatory Visit: Payer: Self-pay | Admitting: Internal Medicine

## 2017-03-10 NOTE — Telephone Encounter (Signed)
Pt is calling stating that the Rx methylphenidate is not helping can not see any differences.  Pt would like to have a call back from the nurse.

## 2017-03-10 NOTE — Telephone Encounter (Signed)
Left message on machine to call back to discuss methylphenidate per message below. Dr. Regis Bill advise on cymbalta refill request and if you had any thing you wanted me to ask pt when she returns my call

## 2017-03-10 NOTE — Telephone Encounter (Signed)
Options one  Increase methylphenidate to 20 mg  Er  options 2   Try a different medication such as  adderall xr  5 mg  Or niehter  Ok to refill cymbalta    For 6 months

## 2017-03-13 NOTE — Telephone Encounter (Signed)
Patient will double on medication and will call back to let you know if Methylphenidate 20 mg will work. If not okay to send in Adderall. Patient will call back to let you know

## 2017-03-24 ENCOUNTER — Telehealth: Payer: Self-pay | Admitting: Internal Medicine

## 2017-03-24 NOTE — Telephone Encounter (Signed)
° ° ° °  Pt call to say she would like to try ADDERRALL. The med did not work for her

## 2017-03-29 MED ORDER — AMPHETAMINE-DEXTROAMPHETAMINE 5 MG PO TABS: 5.0000 mg | ORAL_TABLET | Freq: Every day | ORAL | 0 refills | Status: DC

## 2017-03-29 NOTE — Telephone Encounter (Signed)
Prescription printed for Adderall to take 1 a day with the option of increasing to 10 mg a day.  Please have her make follow-up visit after she tries this medicine to assess response.

## 2017-03-29 NOTE — Telephone Encounter (Signed)
Rx placed up front to be picked up.  Pt aware. Nothing further needed.

## 2017-03-29 NOTE — Telephone Encounter (Signed)
Please advise 

## 2017-04-17 ENCOUNTER — Encounter: Payer: Self-pay | Admitting: Internal Medicine

## 2017-04-17 ENCOUNTER — Ambulatory Visit (INDEPENDENT_AMBULATORY_CARE_PROVIDER_SITE_OTHER): Payer: PPO | Admitting: Internal Medicine

## 2017-04-17 VITALS — BP 132/82 | HR 94 | Temp 97.5°F | Wt 123.6 lb

## 2017-04-17 DIAGNOSIS — F988 Other specified behavioral and emotional disorders with onset usually occurring in childhood and adolescence: Secondary | ICD-10-CM

## 2017-04-17 DIAGNOSIS — R413 Other amnesia: Secondary | ICD-10-CM | POA: Diagnosis not present

## 2017-04-17 DIAGNOSIS — H9190 Unspecified hearing loss, unspecified ear: Secondary | ICD-10-CM

## 2017-04-17 DIAGNOSIS — Z79899 Other long term (current) drug therapy: Secondary | ICD-10-CM | POA: Diagnosis not present

## 2017-04-17 MED ORDER — AMPHETAMINE-DEXTROAMPHETAMINE 5 MG PO TABS: 10.0000 mg | ORAL_TABLET | Freq: Every day | ORAL | 0 refills | Status: DC
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
## 2017-04-17 NOTE — Patient Instructions (Addendum)
Stay on  10 mg per day .   And consider  Adding on staggering   Dose.     Total of 15 mg per day as   Tolerated .    Then contact us when  Running out  To  Refill.   ROV in  cxpx in feb but call in interim about how doing and then  Refill med.

## 2017-04-21 ENCOUNTER — Telehealth: Payer: Self-pay | Admitting: Family Medicine

## 2017-04-21 ENCOUNTER — Encounter: Payer: Self-pay | Admitting: Internal Medicine

## 2017-04-21 NOTE — Telephone Encounter (Signed)
LM x1 for patient.  

## 2017-04-21 NOTE — Telephone Encounter (Signed)
Spoke with pt and advised that Dr. Regis Bill not in office again until Next Wednesday. She is going to try going back to taking the Cymbalta at 1 capsule daily and adding 1 capsule qod. She is aware that we will send message to Dr. Regis Bill for her recommendations and someone will call her once we have that information.   Dr. Regis Bill - Please advise. Thanks!

## 2017-04-21 NOTE — Telephone Encounter (Signed)
Copied from [place] [id number]. Topic: General - Other >> Apr 21, 2017  2:36 PM Scherrie Gerlach wrote: Reason for CRM: pt would like to know if Dr Regis Bill thinks she should take another DULoxetine (CYMBALTA) 60 MG capsule For her  knee pain.  Pt states knees have really been hurting lately.  Particularly right leg from knee down.   Pt does not think an orthopedist would help, unless Dr Regis Bill thinks really necessary.  Would like to try cymbalta first if ok.

## 2017-04-21 NOTE — Telephone Encounter (Signed)
Copied from [place] [id number]. Topic: General - Other >> Apr 21, 2017  2:36 PM Scherrie Gerlach wrote: Reason for CRM: pt would like to know if Dr Regis Bill thinks she should take another DULoxetine (CYMBALTA) 60 MG capsule For her  knee pain.  Pt states knees have really been hurting lately.  Particularly right leg from knee down.   Pt does not think an orthopedist would help, unless Dr Regis Bill thinks really necessary.  Would like to try cymbalta first if ok.

## 2017-04-21 NOTE — Telephone Encounter (Signed)
This encounter was created in error - please disregard.

## 2017-04-24 NOTE — Telephone Encounter (Signed)
Pt states that she has some Cymbalta 60mg  on hand at home that she takes daily Pt is having increased knee pain and she is having to increase her dose again.  Pt started out taking 2 capsules daily and then her dose was decreased d/t increased fatigue as a side effect.  Pt was taking 1 capsules daily with option to increase to alternating 1 capsule with 2 capsules every other day.  Pt wanted to let Dr Regis Bill know that she is increasing her dose back to the alternating dose (1-2 capsules) for now until pain in under control. Patient may also need an early refill bc of change in dose.   Will send to Dr Regis Bill as Juluis Rainier.

## 2017-04-26 MED ORDER — DULOXETINE HCL 30 MG PO CPEP: 30.0000 mg | ORAL_CAPSULE | Freq: Every day | ORAL | 0 refills | Status: DC

## 2017-04-26 NOTE — Addendum Note (Signed)
Addended by: Virl Cagey on: 04/26/2017 04:41 PM   Modules accepted: Orders

## 2017-04-26 NOTE — Telephone Encounter (Signed)
Pt aware of rec's per Dr Regis Bill. Rx sent to [facility] per patient request #[phone] Nothing further needed.

## 2017-04-26 NOTE — Telephone Encounter (Signed)
Second response It would be better for her to take 60 mg +30 mg extra per day as a 90 mg total Instead of taking 120 mg alternating. Advised send in 30 mg capsules to take with her 60 mg dispense 90.

## 2017-05-03 ENCOUNTER — Telehealth: Payer: Self-pay | Admitting: Internal Medicine

## 2017-05-03 NOTE — Telephone Encounter (Signed)
Need dose clarification. Med profile has both dosages (30mg  and 60 mg) and OV dated 01/04/17 has 60 mg as the listed dosage for the Cymbalta.

## 2017-05-03 NOTE — Telephone Encounter (Signed)
Copied from [place]. Topic: Quick Communication - See Telephone Encounter >> May 03, 2017  2:06 PM Synthia Innocent wrote: CRM for notification. See Telephone encounter for:  Have a question on dosage, DULoxetine (CYMBALTA) 30 MG capsule. States last was 60mg , need to verify.  05/03/17.

## 2017-05-04 NOTE — Telephone Encounter (Signed)
Per previous message Panosh, Standley Brooking, MD  Note 9:33 AM    Second response It would be better for her to take 60 mg +30 mg extra per day as a 90 mg total Instead of taking 120 mg alternating. Advised send in 30 mg capsules to take with her 60 mg dispense 90.     Left detailed message on machine for Julie Bonilla giving clarification of Rx's and why there are two different doses.  Left call back number if they need more information. Nothing further needed.

## 2017-05-08 ENCOUNTER — Other Ambulatory Visit: Payer: Self-pay | Admitting: Internal Medicine

## 2017-05-17 ENCOUNTER — Other Ambulatory Visit: Payer: Self-pay | Admitting: Dermatology

## 2017-05-17 DIAGNOSIS — L570 Actinic keratosis: Secondary | ICD-10-CM | POA: Diagnosis not present

## 2017-05-17 DIAGNOSIS — C44311 Basal cell carcinoma of skin of nose: Secondary | ICD-10-CM | POA: Diagnosis not present

## 2017-05-18 ENCOUNTER — Other Ambulatory Visit: Payer: Self-pay | Admitting: Internal Medicine

## 2017-05-18 NOTE — Telephone Encounter (Signed)
Copied from [place]. Topic: Quick Communication - Rx Refill/Question >> May 18, 2017  4:59 PM Patrice Paradise wrote: Has the patient contacted their pharmacy? Yes.   clonazePAM (KLONOPIN) 0.5 MG tablet   (Agent: If no, request that the patient contact the pharmacy for the refill.)  Preferred Pharmacy (with phone number or street name):  [facility] [place], [address] [place] [address] Phone: [phone] Fax: [phone]   Agent: Please be advised that RX refills may take up to 3 business days. We ask that you follow-up with your pharmacy.

## 2017-05-18 NOTE — Telephone Encounter (Signed)
Klonopin refill. Last refill on 01/10/17 with 180 tabs. Last OV 04/17/17

## 2017-05-19 MED ORDER — CLONAZEPAM 0.5 MG PO TABS: ORAL_TABLET | ORAL | 0 refills | Status: DC

## 2017-05-19 NOTE — Telephone Encounter (Signed)
Spoke w/ patient, she would like rx to go to [facility] since it is short term rx in PCP's absence.  Medication phoned to pharmacy as requested.

## 2017-05-19 NOTE — Telephone Encounter (Signed)
Please advise Dr Sarajane Jews if you are okay with refilling this medication in Dr Velora Mediate absence. Thanks.

## 2017-05-19 NOTE — Telephone Encounter (Signed)
Call in #[phone] with no rf 

## 2017-05-22 ENCOUNTER — Other Ambulatory Visit: Payer: Self-pay | Admitting: Internal Medicine

## 2017-05-22 NOTE — Telephone Encounter (Signed)
Adderall refill. Last OV and refill on 04/17/17.

## 2017-05-22 NOTE — Telephone Encounter (Signed)
Copied from [place] #[id number]. Topic: Quick Communication - Rx Refill/Question >> May 22, 2017  9:10 AM Bonilla, Julie Emperor wrote: Has the patient contacted their pharmacy? No.   (Agent: If no, request that the patient contact the pharmacy for the refill.)   Preferred Pharmacy (with phone number or street name): [facility] [address] [place] [phone] (Phone) [phone] (Fax)  Pt is requesting a med refill on her  amphetamine-dextroamphetamine (ADDERALL) 5 MG tablet [[id number]]     Agent: Please be advised that RX refills may take up to 3 business days. We ask that you follow-up with your pharmacy.

## 2017-05-22 NOTE — Telephone Encounter (Signed)
Please advise Dr Sarajane Jews if able to authorize refills in Dr Velora Mediate absence. Thanks.

## 2017-05-26 MED ORDER — AMPHETAMINE-DEXTROAMPHETAMINE 5 MG PO TABS: ORAL_TABLET | ORAL | 0 refills | Status: DC

## 2017-05-26 NOTE — Telephone Encounter (Signed)
This was emailed in

## 2017-06-05 ENCOUNTER — Ambulatory Visit: Payer: PPO | Admitting: Neurology

## 2017-06-05 ENCOUNTER — Encounter: Payer: Self-pay | Admitting: Neurology

## 2017-06-05 VITALS — BP 158/90 | HR 82 | Ht 60.0 in | Wt 123.0 lb

## 2017-06-05 DIAGNOSIS — R413 Other amnesia: Secondary | ICD-10-CM

## 2017-06-05 DIAGNOSIS — Z1231 Encounter for screening mammogram for malignant neoplasm of breast: Secondary | ICD-10-CM | POA: Diagnosis not present

## 2017-06-05 DIAGNOSIS — Z803 Family history of malignant neoplasm of breast: Secondary | ICD-10-CM | POA: Diagnosis not present

## 2017-06-05 LAB — HM MAMMOGRAPHY

## 2017-06-05 NOTE — Patient Instructions (Addendum)
1. Schedule Neurocognitive testing with Dr. Si Raider 2. Follow-up after testing  RECOMMENDATIONS FOR ALL PATIENTS WITH MEMORY PROBLEMS: 1. Continue to exercise (Recommend 30 minutes of walking everyday, or 3 hours every week) 2. Increase social interactions - continue going to [place] and enjoy social gatherings with friends and family 3. Eat healthy, avoid fried foods and eat more fruits and vegetables 4. Maintain adequate blood pressure, blood sugar, and blood cholesterol level. Reducing the risk of stroke and cardiovascular disease also helps promoting better memory. 5. Avoid stressful situations. Live a simple life and avoid aggravations. Organize your time and prepare for the next day in anticipation. 6. Sleep well, avoid any interruptions of sleep and avoid any distractions in the bedroom that may interfere with adequate sleep quality 7. Avoid sugar, avoid sweets as there is a strong link between excessive sugar intake, diabetes, and cognitive impairment The Mediterranean diet has been shown to help patients reduce the risk of progressive memory disorders and reduces cardiovascular risk. This includes eating fish, eat fruits and Briney leafy vegetables, nuts like almonds and hazelnuts, walnuts, and also use olive oil. Avoid fast foods and fried foods as much as possible. Avoid sweets and sugar as sugar use has been linked to worsening of memory function.
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
## 2017-06-12 ENCOUNTER — Encounter: Payer: Self-pay | Admitting: Neurology

## 2017-06-15 ENCOUNTER — Encounter: Payer: Self-pay | Admitting: Internal Medicine

## 2017-06-27 ENCOUNTER — Ambulatory Visit: Payer: PPO | Admitting: Emergency Medicine

## 2017-06-28 DIAGNOSIS — L578 Other skin changes due to chronic exposure to nonionizing radiation: Secondary | ICD-10-CM | POA: Diagnosis not present

## 2017-06-28 DIAGNOSIS — L570 Actinic keratosis: Secondary | ICD-10-CM | POA: Diagnosis not present

## 2017-06-30 ENCOUNTER — Other Ambulatory Visit: Payer: Self-pay | Admitting: Internal Medicine

## 2017-06-30 NOTE — Telephone Encounter (Signed)
Copied from [place]. Topic: Quick Communication - See Telephone Encounter >> Jun 30, 2017 12:17 PM Conception Chancy, NT wrote: CRM for notification. See Telephone encounter for:  06/30/17.  Pt is requesting a refill on Clonazepam 0.5mg  sent into her [employer]. Please advise.

## 2017-07-03 NOTE — Telephone Encounter (Signed)
Clonazepam 0.5mg   Last filled 05/19/17, #60 x 0rf Last OV 04/17/17  Please advise Dr Sarajane Jews if you are okay with refilling this medication in Dr Velora Mediate absence. Thanks.

## 2017-07-05 MED ORDER — CLONAZEPAM 0.5 MG PO TABS: ORAL_TABLET | ORAL | 0 refills | Status: DC

## 2017-07-05 NOTE — Telephone Encounter (Signed)
See my note

## 2017-07-05 NOTE — Telephone Encounter (Signed)
Call in #[phone] with no rf 

## 2017-07-06 ENCOUNTER — Ambulatory Visit: Payer: PPO | Admitting: Emergency Medicine

## 2017-07-07 ENCOUNTER — Telehealth: Payer: Self-pay | Admitting: Internal Medicine

## 2017-07-07 NOTE — Telephone Encounter (Signed)
Copied from [place] [id number]. Topic: Quick Communication - Rx Refill/Question >> Jul 07, 2017  2:02 PM Patrice Paradise wrote: Medication: clonazePAM (KLONOPIN) 0.5 MG tablet    Has the patient contacted their pharmacy? Yes.     (Agent: If no, request that the patient contact the pharmacy for the refill.)   Preferred Pharmacy (with phone number or street name):   [facility] [address], [place] [address] Phone: [phone] Fax: [phone]    Agent: Please be advised that RX refills may take up to 3 business days. We ask that you follow-up with your pharmacy.

## 2017-07-07 NOTE — Telephone Encounter (Signed)
This rx was sent on 07/05/17.

## 2017-07-07 NOTE — Telephone Encounter (Signed)
Klonopin refill request Last OV 04/17/17 [facility] [place], [place] Phone:  [phone] Fax:  [phone]

## 2017-07-10 ENCOUNTER — Other Ambulatory Visit: Payer: Self-pay | Admitting: Internal Medicine

## 2017-07-10 NOTE — Telephone Encounter (Signed)
Copied from [place]. Topic: Quick Communication - See Telephone Encounter >> Jul 10, 2017  9:45 AM Aurelio Brash B wrote: CRM for notification. See Telephone encounter for:  PT is asking for refill of adderall to pick up at office  07/10/17.

## 2017-07-10 NOTE — Telephone Encounter (Signed)
Last filled 05/26/17, #90 x 0rf Last OV 04/17/17 Please advise Dr Regis Bill, thanks.

## 2017-07-11 MED ORDER — AMPHETAMINE-DEXTROAMPHETAMINE 5 MG PO TABS: ORAL_TABLET | ORAL | 0 refills | Status: DC

## 2017-07-11 NOTE — Telephone Encounter (Signed)
Sent in electronically .  

## 2017-07-12 ENCOUNTER — Encounter: Payer: Self-pay | Admitting: Acute Care

## 2017-07-12 ENCOUNTER — Ambulatory Visit: Payer: PPO | Admitting: Acute Care

## 2017-07-12 VITALS — BP 120/56 | HR 89 | Ht 60.0 in | Wt 123.0 lb

## 2017-07-12 DIAGNOSIS — R059 Cough, unspecified: Secondary | ICD-10-CM

## 2017-07-12 DIAGNOSIS — K219 Gastro-esophageal reflux disease without esophagitis: Secondary | ICD-10-CM

## 2017-07-12 DIAGNOSIS — J329 Chronic sinusitis, unspecified: Secondary | ICD-10-CM | POA: Diagnosis not present

## 2017-07-12 DIAGNOSIS — R05 Cough: Secondary | ICD-10-CM

## 2017-07-12 DIAGNOSIS — J479 Bronchiectasis, uncomplicated: Secondary | ICD-10-CM

## 2017-07-12 LAB — NITRIC OXIDE: Nitric Oxide: 11

## 2017-07-12 NOTE — Assessment & Plan Note (Addendum)
Stable interval Compliant with regimen established by Dr. Lamonte Sakai Plan We will do a FENO today. We will change your Claritin to Zyrtec once daily for allergies. Continue your Flonase and Zantac as you have been doing. Remember you can use the Tessalon Perles up to every 6 hours for cough. Don't drive if sleepy. Consider sugar free Jolly Ranchers or Werther's for throat soothing. Consider addition of flutter valve if necessary Follow up with Dr. Lamonte Sakai in 6 months Please contact office for sooner follow up if symptoms do not improve or worsen or seek emergency care
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
## 2017-07-12 NOTE — Assessment & Plan Note (Signed)
Some nasal congestion and postnasal drip No discolored secretions or fever Plan We will do a FENO today. We will change your Claritin to Zyrtec once daily for allergies. Continue your Flonase and Zantac as you have been doing. Remember you can use the Tessalon Perles up to every 6 hours for cough.

## 2017-07-12 NOTE — Patient Instructions (Addendum)
It is nice to meet you today. We will do a FENO today. We will change your Claritin to Zyrtec once daily for allergies. Continue your Flonase and Zantac as you have been doing. Remember you can use the Tessalon Perles up to every 6 hours for cough. Don't drive if sleepy. Consider sugar free Jolly Ranchers or Werther's for throat soothing. Follow up with Dr. Lamonte Sakai in 6 months Please contact office for sooner follow up if symptoms do not improve or worsen or seek emergency care

## 2017-07-12 NOTE — Assessment & Plan Note (Signed)
Well-controlled with current regimen Plan Continue Zantac once daily as you have been doing

## 2017-07-14 ENCOUNTER — Other Ambulatory Visit: Payer: Self-pay | Admitting: Internal Medicine

## 2017-07-17 MED ORDER — LOSARTAN POTASSIUM 50 MG PO TABS: 50.0000 mg | ORAL_TABLET | Freq: Every day | ORAL | 0 refills | Status: DC

## 2017-07-17 MED ORDER — GABAPENTIN 300 MG PO CAPS: 300.0000 mg | ORAL_CAPSULE | Freq: Two times a day (BID) | ORAL | 0 refills | Status: DC

## 2017-07-17 MED ORDER — EZETIMIBE 10 MG PO TABS: 10.0000 mg | ORAL_TABLET | Freq: Every day | ORAL | 0 refills | Status: DC

## 2017-07-17 MED ORDER — DULOXETINE HCL 30 MG PO CPEP: 30.0000 mg | ORAL_CAPSULE | Freq: Every day | ORAL | 0 refills | Status: DC

## 2017-07-20 DIAGNOSIS — M1711 Unilateral primary osteoarthritis, right knee: Secondary | ICD-10-CM | POA: Diagnosis not present

## 2017-07-24 ENCOUNTER — Encounter: Payer: Self-pay | Admitting: Psychology

## 2017-07-24 ENCOUNTER — Ambulatory Visit: Payer: PPO | Admitting: Psychology

## 2017-07-24 ENCOUNTER — Ambulatory Visit (INDEPENDENT_AMBULATORY_CARE_PROVIDER_SITE_OTHER): Payer: PPO | Admitting: Psychology

## 2017-07-24 DIAGNOSIS — R41840 Attention and concentration deficit: Secondary | ICD-10-CM

## 2017-07-24 DIAGNOSIS — R413 Other amnesia: Secondary | ICD-10-CM

## 2017-07-24 NOTE — Progress Notes (Signed)
   Neuropsychology Note  Julie Bonilla completed 60 minutes of neuropsychological testing with technician, Milana Kidney, BS, under the supervision of Dr. Macarthur Critchley, Licensed Psychologist. The patient did not appear overtly distressed by the testing session, per behavioral observation or via self-report to the technician. Rest breaks were offered.   Clinical Decision Making: In considering the patient's current level of functioning, level of presumed impairment, nature of symptoms, emotional and behavioral responses during the interview, level of literacy, and observed level of motivation/effort, a battery of tests was selected and communicated to the psychometrician.  Communication between the psychologist and technician was ongoing throughout the testing session and changes were made as deemed necessary based on patient performance on testing, technician observations and additional pertinent factors such as those listed above.  Julie Bonilla will return within approximately 2 weeks for an interactive feedback session with Dr. Si Raider at which time her test performances, clinical impressions and treatment recommendations will be reviewed in detail. The patient understands she can contact our office should she require our assistance before this time.  15 minutes spent performing neuropsychological evaluation services/clinical decision making (psychologist). [CPT 92446] 60 minutes spent face-to-face with patient administering standardized tests, 30 minutes spent scoring (technician). [CPT [id number], 28638]  Full report to follow.
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
## 2017-08-03 ENCOUNTER — Ambulatory Visit (INDEPENDENT_AMBULATORY_CARE_PROVIDER_SITE_OTHER): Payer: PPO | Admitting: Psychology

## 2017-08-03 ENCOUNTER — Encounter: Payer: Self-pay | Admitting: Psychology

## 2017-08-03 DIAGNOSIS — R41840 Attention and concentration deficit: Secondary | ICD-10-CM

## 2017-08-03 DIAGNOSIS — F902 Attention-deficit hyperactivity disorder, combined type: Secondary | ICD-10-CM

## 2017-08-03 DIAGNOSIS — R413 Other amnesia: Secondary | ICD-10-CM | POA: Diagnosis not present

## 2017-08-03 NOTE — Patient Instructions (Signed)
No sign of underlying Alzheimer's disease, memory disorder or dementia. In fact, performances on memory tests were well above average.  The only area of difficulty was on a test requiring mental flexibility/set-shifting, which can be impaired in people with ADHD.   It is likely that you have always had some ADHD and it may be exacerbated by the aging process.    Strategies to enhance cognitive functioning Attention, concentration, memory encoding and consolidation    . Make a plan and be prepared o If you find that you are more attentive at certain times of the day (i.e., the morning), plan important activities and discussions at that time o Determine which activities take the most time and which are most important, then prioritize your "to do list" based on this information o Break tasks into simpler parts, understand the steps involved before starting o Rehearse the steps mentally or write them down. If you write them down, you can use this as a checklist to check off as you complete them. o Visualize completing the task  . Use external aids  o Write everything down that you do not need to know or work with right now. Don't store extra information in your brain that you don't need right now.  o Use a calendar or planner to make checklists, due dates and "to do" lists. o Use ONLY ONE calendar or planner and look at it frequently o Set alarms for important deadlines or appointments  . Minimize interruptions and distractions  o Find a good work environment, e.g., quiet room with a desk, close curtains, use earplugs, mask sounds with a fan or white noise machine o Turn off cell phone and/or email alerts during important tasks. In fact, it is helpful to schedule a block of time each day where you limit phone and email interruptions and focus on just the more detailed work you have. o Try to minimize the amount of background noise (i.e., television, music) when engaged in important tasks or  conversations with others (note that some individuals find soft background music helpful in minimize distraction, so you may need to experiment with optimal level of noise for specific situations)  . Use active effort = consciously attending to details, closely analyzing o Failures of encoding may reflect failure to attend to one's own actions o Be prepared to work more slowly than you usually do  o When reading, allow time for re-reading sections  o Check your work for errors  . Avoid multitasking o Do not attempt to complete more than one task at one time. Focus on one task until it is completed and then move on to the next one. o Avoid other activities while engaged in important tasks, such as talking on the phone while balancing the checkbook, making a shopping list during a meeting.   . Use self-talk during tasks o Repeat the steps of the activity to yourself as you complete them o Talk to yourself about your progress o This helps you maintain focus on the task and makes it easier to remember completing the task (Similar to "active effort" above)  . Conserve energy o Conserve energy to avoid fatigue and its effects on cognition - Get enough sleep - Pace yourself  and make sure to take breaks - Be open to receiving help - Exercise for increased energy  . Conversational vigilance = paying attention during a conversation  o Listen actively: focus on the speaker and position yourself so that you can clearly hear  the him/her, and have open/relaxed posture  o Eye contact: Maintaining eye contact with the person you are speaking with may increase the likelihood that important information is properly received  o Ask questions: Ask questions for clarification (e.g., request that the speaker explain something in a different way) or ask for information to be repeated if you become distracted, or if you do not hear or understand something during a conversation  o Paraphrase: Summarize or  repeat back important information from a conversation in your own words to facilitate communication and ensure that you have heard correctly and understand

## 2017-08-14 DIAGNOSIS — H6121 Impacted cerumen, right ear: Secondary | ICD-10-CM | POA: Diagnosis not present

## 2017-08-25 ENCOUNTER — Other Ambulatory Visit: Payer: Self-pay | Admitting: Family Medicine

## 2017-08-28 NOTE — Telephone Encounter (Signed)
I will send this to Dr. Regis Bill

## 2017-08-28 NOTE — Telephone Encounter (Signed)
Sent in electroncially

## 2017-08-28 NOTE — Telephone Encounter (Signed)
Last OV 07/07/2017  Last refilled 07/05/2017 disp 60 with no refills  Sent to PCP for approval

## 2017-08-29 DIAGNOSIS — H02831 Dermatochalasis of right upper eyelid: Secondary | ICD-10-CM | POA: Diagnosis not present

## 2017-08-29 DIAGNOSIS — H02834 Dermatochalasis of left upper eyelid: Secondary | ICD-10-CM | POA: Diagnosis not present

## 2017-09-20 ENCOUNTER — Ambulatory Visit (INDEPENDENT_AMBULATORY_CARE_PROVIDER_SITE_OTHER): Payer: PPO

## 2017-09-20 ENCOUNTER — Telehealth: Payer: Self-pay

## 2017-09-20 VITALS — BP 110/70 | HR 70 | Ht 60.0 in | Wt 126.2 lb

## 2017-09-20 DIAGNOSIS — Z0000 Encounter for general adult medical examination without abnormal findings: Secondary | ICD-10-CM

## 2017-09-20 DIAGNOSIS — E2839 Other primary ovarian failure: Secondary | ICD-10-CM | POA: Diagnosis not present
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
## 2017-09-20 NOTE — Telephone Encounter (Signed)
Agreed to repeat dexa at [facility] I will fax the requisition to [facility]

## 2017-09-20 NOTE — Patient Instructions (Addendum)
Julie Bonilla , Thank you for taking time to come for your Medicare Wellness Visit. I appreciate your ongoing commitment to your health goals. Please review the following plan we discussed and let me know if I can assist you in the future.   Will plan to order your bone density and you should get a call to schedule Julie Bonilla will order at [facility] )  Your colonoscopy in June   Please make an apt with Dr. Regis Bill after June 20th  Also may see if she thinks Dr. Glennon Hamilton may assist with ADHD focus etc   Shingrix is a vaccine for the prevention of Shingles in Adults 50 and older.  If you are on Medicare, the shingrix is covered under your Part D plan, so you will take both of the vaccines in the series at your pharmacy. Please check with your benefits regarding applicable copays or out of pocket expenses.  The Shingrix is given in 2 vaccines approx 8 weeks apart. You must receive the 2nd dose prior to 6 months from receipt of the first. Please have the pharmacist print out you Immunization  dates for our office records     These are the goals we discussed: Goals    . Patient Stated     Get her diet back in line with your norm.    . Weight (lb) < 118 lb (53.5 kg)     Lose 4 to 5 lbs;  Lost 2 to 3 lbs;   Fat free or low fat dairy products Fish high in omega-3 acids ( salmon, tuna, trout) Fruits, such as apples, bananas, oranges, pears, prunes Legumes, such as kidney beans, lentils, checkpeas, black-eyed peas and lima beans Vegetables; broccoli, cabbage, carrots Whole grains;   Plant fats are better; decrease "white" foods as pasta, rice, bread and desserts, sugar; Avoid red meat (limiting) palm and coconut oils; sugary foods and beverages  Two nutrients that raise blood chol levels are saturated fats and trans fat; in hydrogenated oils and fats, as stick margarine, baked goods (cookes, cakes, pies, crackers; frosting; and coffee creamers;   Some Fats lower cholesterol: Monounsaturated and  polyunsaturated  Avocados Corn, sunflower, and soybean oils Nuts and seeds, such as walnuts Olive, canola, peanut, safflower, and sesame oils Peanut butter Salmon and trout Tofu            This is a list of the screening recommended for you and due dates:  Health Maintenance  Topic Date Due  . Colon Cancer Screening  11/17/2017  . Flu Shot  01/04/2018  . Tetanus Vaccine  03/05/2020  . DEXA scan (bone density measurement)  Completed  . Pneumonia vaccines  Completed     Bone Densitometry Bone densitometry is an imaging test that uses a special X-ray to measure the amount of calcium and other minerals in your bones (bone density). This test is also known as a bone mineral density test or dual-energy X-ray absorptiometry (DXA). The test can measure bone density at your hip and your spine. It is similar to having a regular X-ray. You may have this test to:  Diagnose a condition that causes weak or thin bones (osteoporosis).  Predict your risk of a broken bone (fracture).  Determine how well osteoporosis treatment is working.  Tell a health care provider about:  Any allergies you have.  All medicines you are taking, including vitamins, herbs, eye drops, creams, and over-the-counter medicines.  Any problems you or family members have had with anesthetic medicines.  Any  blood disorders you have.  Any surgeries you have had.  Any medical conditions you have.  Possibility of pregnancy.  Any other medical test you had within the previous 14 days that used contrast material. What are the risks? Generally, this is a safe procedure. However, problems can occur and may include the following:  This test exposes you to a very small amount of radiation.  The risks of radiation exposure may be greater to unborn children.  What happens before the procedure?  Do not take any calcium supplements for 24 hours before having the test. You can otherwise eat and drink what you usually  do.  Take off all metal jewelry, eyeglasses, dental appliances, and any other metal objects. What happens during the procedure?  You may lie on an exam table. There will be an X-ray generator below you and an imaging device above you.  Other devices, such as boxes or braces, may be used to position your body properly for the scan.  You will need to lie still while the machine slowly scans your body.  The images will show up on a computer monitor. What happens after the procedure? You may need more testing at a later time. This information is not intended to replace advice given to you by your health care provider. Make sure you discuss any questions you have with your health care provider. Document Released: 06/14/2004 Document Revised: 10/29/2015 Document Reviewed: 10/31/2013 Elsevier Interactive Patient Education  2018 [place] in the Home Falls can cause injuries. They can happen to people of all ages. There are many things you can do to make your home safe and to help prevent falls. What can I do on the outside of my home?  Regularly fix the edges of walkways and driveways and fix any cracks.  Remove anything that might make you trip as you walk through a door, such as a raised step or threshold.  Trim any bushes or trees on the path to your home.  Use bright outdoor lighting.  Clear any walking paths of anything that might make someone trip, such as rocks or tools.  Regularly check to see if handrails are loose or broken. Make sure that both sides of any steps have handrails.  Any raised decks and porches should have guardrails on the edges.  Have any leaves, snow, or ice cleared regularly.  Use sand or salt on walking paths during winter.  Clean up any spills in your garage right away. This includes oil or grease spills. What can I do in the bathroom?  Use night lights.  Install grab bars by the toilet and in the tub and shower. Do not use  towel bars as grab bars.  Use non-skid mats or decals in the tub or shower.  If you need to sit down in the shower, use a plastic, non-slip stool.  Keep the floor dry. Clean up any water that spills on the floor as soon as it happens.  Remove soap buildup in the tub or shower regularly.  Attach bath mats securely with double-sided non-slip rug tape.  Do not have throw rugs and other things on the floor that can make you trip. What can I do in the bedroom?  Use night lights.  Make sure that you have a light by your bed that is easy to reach.  Do not use any sheets or blankets that are too big for your bed. They should not hang down onto the  floor.  Have a firm chair that has side arms. You can use this for support while you get dressed.  Do not have throw rugs and other things on the floor that can make you trip. What can I do in the kitchen?  Clean up any spills right away.  Avoid walking on wet floors.  Keep items that you use a lot in easy-to-reach places.  If you need to reach something above you, use a strong step stool that has a grab bar.  Keep electrical cords out of the way.  Do not use floor polish or wax that makes floors slippery. If you must use wax, use non-skid floor wax.  Do not have throw rugs and other things on the floor that can make you trip. What can I do with my stairs?  Do not leave any items on the stairs.  Make sure that there are handrails on both sides of the stairs and use them. Fix handrails that are broken or loose. Make sure that handrails are as long as the stairways.  Check any carpeting to make sure that it is firmly attached to the stairs. Fix any carpet that is loose or worn.  Avoid having throw rugs at the top or bottom of the stairs. If you do have throw rugs, attach them to the floor with carpet tape.  Make sure that you have a light switch at the top of the stairs and the bottom of the stairs. If you do not have them, ask  someone to add them for you. What else can I do to help prevent falls?  Wear shoes that: ? Do not have high heels. ? Have rubber bottoms. ? Are comfortable and fit you well. ? Are closed at the toe. Do not wear sandals.  If you use a stepladder: ? Make sure that it is fully opened. Do not climb a closed stepladder. ? Make sure that both sides of the stepladder are locked into place. ? Ask someone to hold it for you, if possible.  Clearly mark and make sure that you can see: ? Any grab bars or handrails. ? First and last steps. ? Where the edge of each step is.  Use tools that help you move around (mobility aids) if they are needed. These include: ? Canes. ? Walkers. ? Scooters. ? Crutches.  Turn on the lights when you go into a dark area. Replace any light bulbs as soon as they burn out.  Set up your furniture so you have a clear path. Avoid moving your furniture around.  If any of your floors are uneven, fix them.  If there are any pets around you, be aware of where they are.  Review your medicines with your doctor. Some medicines can make you feel dizzy. This can increase your chance of falling. Ask your doctor what other things that you can do to help prevent falls. This information is not intended to replace advice given to you by your health care provider. Make sure you discuss any questions you have with your health care provider. Document Released: 03/19/2009 Document Revised: 10/29/2015 Document Reviewed: 06/27/2014 Elsevier Interactive Patient Education  2018 [employer].   Hearing Loss Hearing loss is a partial or total loss of the ability to hear. This can be temporary or permanent, and it can happen in one or both ears. Hearing loss may be referred to as deafness. Medical care is necessary to treat hearing loss properly and to prevent the condition  from getting worse. Your hearing may partially or completely come back, depending on what caused your hearing loss  and how severe it is. In some cases, hearing loss is permanent. What are the causes? Common causes of hearing loss include:  Too much wax in the ear canal.  Infection of the ear canal or middle ear.  Fluid in the middle ear.  Injury to the ear or surrounding area.  An object stuck in the ear.  Prolonged exposure to loud sounds, such as music.  Less common causes of hearing loss include:  Tumors in the ear.  Viral or bacterial infections, such as meningitis.  A hole in the eardrum (perforated eardrum).  Problems with the hearing nerve that sends signals between the brain and the ear.  Certain medicines.  What are the signs or symptoms? Symptoms of this condition may include:  Difficulty telling the difference between sounds.  Difficulty following a conversation when there is background noise.  Lack of response to sounds in your environment. This may be most noticeable when you do not respond to startling sounds.  Needing to turn up the volume on the television, radio, etc.  Ringing in the ears.  Dizziness.  Pain in the ears.  How is this diagnosed? This condition is diagnosed based on a physical exam and a hearing test (audiometry). The audiometry test will be performed by a hearing specialist (audiologist). You may also be referred to an ear, nose, and throat (ENT) specialist (otolaryngologist). How is this treated? Treatment for recent onset of hearing loss may include:  Ear wax removal.  Being prescribed medicines to prevent infection (antibiotics).  Being prescribed medicines to reduce inflammation (corticosteroids).  Follow these instructions at home:  If you were prescribed an antibiotic medicine, take it as told by your health care provider. Do not stop taking the antibiotic even if you start to feel better.  Take over-the-counter and prescription medicines only as told by your health care provider.  Avoid loud noises.  Return to your normal  activities as told by your health care provider. Ask your health care provider what activities are safe for you.  Keep all follow-up visits as told by your health care provider. This is important. Contact a health care provider if:  You feel dizzy.  You develop new symptoms.  You vomit or feel nauseous.  You have a fever. Get help right away if:  You develop sudden changes in your vision.  You have severe ear pain.  You have new or increased weakness.  You have a severe headache. This information is not intended to replace advice given to you by your health care provider. Make sure you discuss any questions you have with your health care provider. Document Released: 05/23/2005 Document Revised: 10/29/2015 Document Reviewed: 10/08/2014 Elsevier Interactive Patient Education  2018 [employer].

## 2017-09-22 ENCOUNTER — Other Ambulatory Visit: Payer: Self-pay | Admitting: Internal Medicine

## 2017-09-22 NOTE — Telephone Encounter (Signed)
Copied from [place] [phone]. Topic: Quick Communication - See Telephone Encounter >> Sep 22, 2017  1:38 PM Conception Chancy, NT wrote: CRM for notification. See Telephone encounter for: 09/22/17.  Patient is requesting a refill on DULoxetine (CYMBALTA) 60 MG capsule and clonazePAM (KLONOPIN) 0.5 MG tablet. She would like 90 day supplies.  [facility] - [place], [place] [address] Phone: [phone] Fax: [phone]

## 2017-09-22 NOTE — Telephone Encounter (Signed)
LOV  04/17/17 Dr. Regis Bill Klonopin - last refilled 08/28/17 Cymbalta - last refilled 03/13/17 [place]

## 2017-09-27 NOTE — Telephone Encounter (Signed)
She is due for  Visit   ( see last note   Feb  )  Can refill x 1 but needs ov before further refills

## 2017-09-28 MED ORDER — DULOXETINE HCL 60 MG PO CPEP: 60.0000 mg | ORAL_CAPSULE | Freq: Every day | ORAL | 0 refills | Status: DC

## 2017-09-28 MED ORDER — CLONAZEPAM 0.5 MG PO TABS: ORAL_TABLET | ORAL | 0 refills | Status: DC

## 2017-09-28 NOTE — Telephone Encounter (Signed)
Left detailed message on machine for patient to call and schedule an appointment.  DPR. Rx called in and sent.

## 2017-10-04 DIAGNOSIS — M8589 Other specified disorders of bone density and structure, multiple sites: Secondary | ICD-10-CM | POA: Diagnosis not present

## 2017-10-04 DIAGNOSIS — M810 Age-related osteoporosis without current pathological fracture: Secondary | ICD-10-CM | POA: Diagnosis not present

## 2017-10-04 LAB — HM DEXA SCAN

## 2017-10-12 DIAGNOSIS — H02403 Unspecified ptosis of bilateral eyelids: Secondary | ICD-10-CM | POA: Diagnosis not present

## 2017-10-12 DIAGNOSIS — H02831 Dermatochalasis of right upper eyelid: Secondary | ICD-10-CM | POA: Diagnosis not present

## 2017-10-12 DIAGNOSIS — H53453 Other localized visual field defect, bilateral: Secondary | ICD-10-CM | POA: Diagnosis not present

## 2017-10-12 DIAGNOSIS — H02834 Dermatochalasis of left upper eyelid: Secondary | ICD-10-CM | POA: Diagnosis not present
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
## 2017-10-22 ENCOUNTER — Other Ambulatory Visit: Payer: Self-pay | Admitting: Internal Medicine

## 2017-10-23 ENCOUNTER — Ambulatory Visit (INDEPENDENT_AMBULATORY_CARE_PROVIDER_SITE_OTHER): Payer: PPO | Admitting: Internal Medicine

## 2017-10-23 ENCOUNTER — Encounter: Payer: Self-pay | Admitting: Internal Medicine

## 2017-10-23 VITALS — BP 138/76 | HR 83 | Temp 98.2°F | Ht 59.75 in | Wt 123.9 lb

## 2017-10-23 DIAGNOSIS — R413 Other amnesia: Secondary | ICD-10-CM | POA: Diagnosis not present

## 2017-10-23 DIAGNOSIS — G609 Hereditary and idiopathic neuropathy, unspecified: Secondary | ICD-10-CM

## 2017-10-23 DIAGNOSIS — F988 Other specified behavioral and emotional disorders with onset usually occurring in childhood and adolescence: Secondary | ICD-10-CM

## 2017-10-23 DIAGNOSIS — Z789 Other specified health status: Secondary | ICD-10-CM | POA: Diagnosis not present

## 2017-10-23 DIAGNOSIS — Z0000 Encounter for general adult medical examination without abnormal findings: Secondary | ICD-10-CM

## 2017-10-23 DIAGNOSIS — E785 Hyperlipidemia, unspecified: Secondary | ICD-10-CM | POA: Diagnosis not present

## 2017-10-23 DIAGNOSIS — Z79899 Other long term (current) drug therapy: Secondary | ICD-10-CM | POA: Diagnosis not present

## 2017-10-23 DIAGNOSIS — J479 Bronchiectasis, uncomplicated: Secondary | ICD-10-CM

## 2017-10-23 DIAGNOSIS — I10 Essential (primary) hypertension: Secondary | ICD-10-CM

## 2017-10-23 NOTE — Patient Instructions (Addendum)
Glad you  Are doing better.   Plan" fasting" lab   Appt.      So we can check your cholesterol and sugar  Level  Etc.     If all ok then ROV in 6 months  To recheck    Health Maintenance, Female Adopting a healthy lifestyle and getting preventive care can go a long way to promote health and wellness. Talk with your health care provider about what schedule of regular examinations is right for you. This is a good chance for you to check in with your provider about disease prevention and staying healthy. In between checkups, there are plenty of things you can do on your own. Experts have done a lot of research about which lifestyle changes and preventive measures are most likely to keep you healthy. Ask your health care provider for more information. Weight and diet Eat a healthy diet  Be sure to include plenty of vegetables, fruits, low-fat dairy products, and lean protein.  Do not eat a lot of foods high in solid fats, added sugars, or salt.  Get regular exercise. This is one of the most important things you can do for your health. ? Most adults should exercise for at least 150 minutes each week. The exercise should increase your heart rate and make you sweat (moderate-intensity exercise). ? Most adults should also do strengthening exercises at least twice a week. This is in addition to the moderate-intensity exercise.  Maintain a healthy weight  Body mass index (BMI) is a measurement that can be used to identify possible weight problems. It estimates body fat based on height and weight. Your health care provider can help determine your BMI and help you achieve or maintain a healthy weight.  For females 46 years of age and older: ? A BMI below 18.5 is considered underweight. ? A BMI of 18.5 to 24.9 is normal. ? A BMI of 25 to 29.9 is considered overweight. ? A BMI of 30 and above is considered obese.  Watch levels of cholesterol and blood lipids  You should start having your blood  tested for lipids and cholesterol at 77 years of age, then have this test every 5 years.  You may need to have your cholesterol levels checked more often if: ? Your lipid or cholesterol levels are high. ? You are older than 77 years of age. ? You are at high risk for heart disease.  Cancer screening Lung Cancer  Lung cancer screening is recommended for adults 21-75 years old who are at high risk for lung cancer because of a history of smoking.  A yearly low-dose CT scan of the lungs is recommended for people who: ? Currently smoke. ? Have quit within the past 15 years. ? Have at least a 30-pack-year history of smoking. A pack year is smoking an average of one pack of cigarettes a day for 1 year.  Yearly screening should continue until it has been 15 years since you quit.  Yearly screening should stop if you develop a health problem that would prevent you from having lung cancer treatment.  Breast Cancer  Practice breast self-awareness. This means understanding how your breasts normally appear and feel.  It also means doing regular breast self-exams. Let your health care provider know about any changes, no matter how small.  If you are in your 20s or 30s, you should have a clinical breast exam (CBE) by a health care provider every 1-3 years as part of a regular  health exam.  If you are 40 or older, have a CBE every year. Also consider having a breast X-ray (mammogram) every year.  If you have a family history of breast cancer, talk to your health care provider about genetic screening.  If you are at high risk for breast cancer, talk to your health care provider about having an MRI and a mammogram every year.  Breast cancer gene (BRCA) assessment is recommended for women who have family members with BRCA-related cancers. BRCA-related cancers include: ? Breast. ? Ovarian. ? Tubal. ? Peritoneal cancers.  Results of the assessment will determine the need for genetic counseling and  BRCA1 and BRCA2 testing.  Cervical Cancer Your health care provider may recommend that you be screened regularly for cancer of the pelvic organs (ovaries, uterus, and vagina). This screening involves a pelvic examination, including checking for microscopic changes to the surface of your cervix (Pap test). You may be encouraged to have this screening done every 3 years, beginning at age 54.  For women ages 39-65, health care providers may recommend pelvic exams and Pap testing every 3 years, or they may recommend the Pap and pelvic exam, combined with testing for human papilloma virus (HPV), every 5 years. Some types of HPV increase your risk of cervical cancer. Testing for HPV may also be done on women of any age with unclear Pap test results.  Other health care providers may not recommend any screening for nonpregnant women who are considered low risk for pelvic cancer and who do not have symptoms. Ask your health care provider if a screening pelvic exam is right for you.  If you have had past treatment for cervical cancer or a condition that could lead to cancer, you need Pap tests and screening for cancer for at least 20 years after your treatment. If Pap tests have been discontinued, your risk factors (such as having a new sexual partner) need to be reassessed to determine if screening should resume. Some women have medical problems that increase the chance of getting cervical cancer. In these cases, your health care provider may recommend more frequent screening and Pap tests.  Colorectal Cancer  This type of cancer can be detected and often prevented.  Routine colorectal cancer screening usually begins at 77 years of age and continues through 77 years of age.  Your health care provider may recommend screening at an earlier age if you have risk factors for colon cancer.  Your health care provider may also recommend using home test kits to check for hidden blood in the stool.  A small camera  at the end of a tube can be used to examine your colon directly (sigmoidoscopy or colonoscopy). This is done to check for the earliest forms of colorectal cancer.  Routine screening usually begins at age 62.  Direct examination of the colon should be repeated every 5-10 years through 77 years of age. However, you may need to be screened more often if early forms of precancerous polyps or small growths are found.  Skin Cancer  Check your skin from head to toe regularly.  Tell your health care provider about any new moles or changes in moles, especially if there is a change in a mole's shape or color.  Also tell your health care provider if you have a mole that is larger than the size of a pencil eraser.  Always use sunscreen. Apply sunscreen liberally and repeatedly throughout the day.  Protect yourself by wearing long sleeves, pants, a  wide-brimmed hat, and sunglasses whenever you are outside.  Heart disease, diabetes, and high blood pressure  High blood pressure causes heart disease and increases the risk of stroke. High blood pressure is more likely to develop in: ? People who have blood pressure in the high end of the normal range (130-139/85-89 mm Hg). ? People who are overweight or obese. ? People who are African American.  If you are 19-33 years of age, have your blood pressure checked every 3-5 years. If you are 12 years of age or older, have your blood pressure checked every year. You should have your blood pressure measured twice-once when you are at a hospital or clinic, and once when you are not at a hospital or clinic. Record the average of the two measurements. To check your blood pressure when you are not at a hospital or clinic, you can use: ? An automated blood pressure machine at a pharmacy. ? A home blood pressure monitor.  If you are between 86 years and 78 years old, ask your health care provider if you should take aspirin to prevent strokes.  Have regular diabetes  screenings. This involves taking a blood sample to check your fasting blood sugar level. ? If you are at a normal weight and have a low risk for diabetes, have this test once every three years after 77 years of age. ? If you are overweight and have a high risk for diabetes, consider being tested at a younger age or more often. Preventing infection Hepatitis B  If you have a higher risk for hepatitis B, you should be screened for this virus. You are considered at high risk for hepatitis B if: ? You were born in a country where hepatitis B is common. Ask your health care provider which countries are considered high risk. ? Your parents were born in a high-risk country, and you have not been immunized against hepatitis B (hepatitis B vaccine). ? You have HIV or AIDS. ? You use needles to inject street drugs. ? You live with someone who has hepatitis B. ? You have had sex with someone who has hepatitis B. ? You get hemodialysis treatment. ? You take certain medicines for conditions, including cancer, organ transplantation, and autoimmune conditions.  Hepatitis C  Blood testing is recommended for: ? Everyone born from [birth year] through 1965. ? Anyone with known risk factors for hepatitis C.  Sexually transmitted infections (STIs)  You should be screened for sexually transmitted infections (STIs) including gonorrhea and chlamydia if: ? You are sexually active and are younger than 77 years of age. ? You are older than 77 years of age and your health care provider tells you that you are at risk for this type of infection. ? Your sexual activity has changed since you were last screened and you are at an increased risk for chlamydia or gonorrhea. Ask your health care provider if you are at risk.  If you do not have HIV, but are at risk, it may be recommended that you take a prescription medicine daily to prevent HIV infection. This is called pre-exposure prophylaxis (PrEP). You are considered at risk  if: ? You are sexually active and do not regularly use condoms or know the HIV status of your partner(s). ? You take drugs by injection. ? You are sexually active with a partner who has HIV.  Talk with your health care provider about whether you are at high risk of being infected with HIV. If you choose to  begin PrEP, you should first be tested for HIV. You should then be tested every 3 months for as long as you are taking PrEP. Pregnancy  If you are premenopausal and you may become pregnant, ask your health care provider about preconception counseling.  If you may become pregnant, take 400 to 800 micrograms (mcg) of folic acid every day.  If you want to prevent pregnancy, talk to your health care provider about birth control (contraception). Osteoporosis and menopause  Osteoporosis is a disease in which the bones lose minerals and strength with aging. This can result in serious bone fractures. Your risk for osteoporosis can be identified using a bone density scan.  If you are 53 years of age or older, or if you are at risk for osteoporosis and fractures, ask your health care provider if you should be screened.  Ask your health care provider whether you should take a calcium or vitamin D supplement to lower your risk for osteoporosis.  Menopause may have certain physical symptoms and risks.  Hormone replacement therapy may reduce some of these symptoms and risks. Talk to your health care provider about whether hormone replacement therapy is right for you. Follow these instructions at home:  Schedule regular health, dental, and eye exams.  Stay current with your immunizations.  Do not use any tobacco products including cigarettes, chewing tobacco, or electronic cigarettes.  If you are pregnant, do not drink alcohol.  If you are breastfeeding, limit how much and how often you drink alcohol.  Limit alcohol intake to no more than 1 drink per day for nonpregnant women. One drink  equals 12 ounces of beer, 5 ounces of wine, or 1 ounces of hard liquor.  Do not use street drugs.  Do not share needles.  Ask your health care provider for help if you need support or information about quitting drugs.  Tell your health care provider if you often feel depressed.  Tell your health care provider if you have ever been abused or do not feel safe at home. This information is not intended to replace advice given to you by your health care provider. Make sure you discuss any questions you have with your health care provider. Document Released: 12/06/2010 Document Revised: 10/29/2015 Document Reviewed: 02/24/2015 Elsevier Interactive Patient Education  [employer].

## 2017-10-31 DIAGNOSIS — L819 Disorder of pigmentation, unspecified: Secondary | ICD-10-CM | POA: Diagnosis not present

## 2017-10-31 DIAGNOSIS — C44519 Basal cell carcinoma of skin of other part of trunk: Secondary | ICD-10-CM | POA: Diagnosis not present

## 2017-10-31 DIAGNOSIS — L570 Actinic keratosis: Secondary | ICD-10-CM | POA: Diagnosis not present

## 2017-10-31 DIAGNOSIS — D485 Neoplasm of uncertain behavior of skin: Secondary | ICD-10-CM | POA: Diagnosis not present

## 2017-11-01 ENCOUNTER — Other Ambulatory Visit (INDEPENDENT_AMBULATORY_CARE_PROVIDER_SITE_OTHER): Payer: PPO

## 2017-11-01 ENCOUNTER — Telehealth: Payer: Self-pay | Admitting: Family Medicine

## 2017-11-01 ENCOUNTER — Encounter: Payer: Self-pay | Admitting: Internal Medicine

## 2017-11-01 DIAGNOSIS — I10 Essential (primary) hypertension: Secondary | ICD-10-CM

## 2017-11-01 DIAGNOSIS — J479 Bronchiectasis, uncomplicated: Secondary | ICD-10-CM | POA: Diagnosis not present

## 2017-11-01 DIAGNOSIS — G609 Hereditary and idiopathic neuropathy, unspecified: Secondary | ICD-10-CM | POA: Diagnosis not present

## 2017-11-01 DIAGNOSIS — Z79899 Other long term (current) drug therapy: Secondary | ICD-10-CM

## 2017-11-01 DIAGNOSIS — F988 Other specified behavioral and emotional disorders with onset usually occurring in childhood and adolescence: Secondary | ICD-10-CM | POA: Diagnosis not present

## 2017-11-01 DIAGNOSIS — E785 Hyperlipidemia, unspecified: Secondary | ICD-10-CM

## 2017-11-01 LAB — CBC WITH DIFFERENTIAL/PLATELET
BASOS PCT: 1 % (ref 0.0–3.0)
Basophils Absolute: 0 10*3/uL (ref 0.0–0.1)
Eosinophils Absolute: 0.1 10*3/uL (ref 0.0–0.7)
Eosinophils Relative: 3.2 % (ref 0.0–5.0)
HEMATOCRIT: 40.4 % (ref 36.0–46.0)
HEMOGLOBIN: 13.4 g/dL (ref 12.0–15.0)
LYMPHS PCT: 49 % — AB (ref 12.0–46.0)
Lymphs Abs: 2.2 10*3/uL (ref 0.7–4.0)
MCHC: 33.3 g/dL (ref 30.0–36.0)
MCV: 92.2 fl (ref 78.0–100.0)
MONOS PCT: 8.2 % (ref 3.0–12.0)
Monocytes Absolute: 0.4 10*3/uL (ref 0.1–1.0)
NEUTROS ABS: 1.7 10*3/uL (ref 1.4–7.7)
Neutrophils Relative %: 38.6 % — ABNORMAL LOW (ref 43.0–77.0)
PLATELETS: 244 10*3/uL (ref 150.0–400.0)
RBC: 4.38 Mil/uL (ref 3.87–5.11)
RDW: 14.9 % (ref 11.5–15.5)
WBC: 4.4 10*3/uL (ref 4.0–10.5)

## 2017-11-01 LAB — TSH: TSH: 1.95 u[IU]/mL (ref 0.35–4.50)

## 2017-11-01 LAB — HEPATIC FUNCTION PANEL
ALT: 13 U/L (ref 0–35)
AST: 16 U/L (ref 0–37)
Albumin: 4.2 g/dL (ref 3.5–5.2)
Alkaline Phosphatase: 51 U/L (ref 39–117)
BILIRUBIN DIRECT: 0.1 mg/dL (ref 0.0–0.3)
BILIRUBIN TOTAL: 0.5 mg/dL (ref 0.2–1.2)
Total Protein: 6.7 g/dL (ref 6.0–8.3)

## 2017-11-01 LAB — BASIC METABOLIC PANEL
BUN: 15 mg/dL (ref 6–23)
CO2: 26 meq/L (ref 19–32)
CREATININE: 0.71 mg/dL (ref 0.40–1.20)
Calcium: 9.4 mg/dL (ref 8.4–10.5)
Chloride: 106 mEq/L (ref 96–112)
GFR: 84.78 mL/min (ref >60.00)
Glucose, Bld: 101 mg/dL — ABNORMAL HIGH (ref 70–99)
Potassium: 4.7 mEq/L (ref 3.5–5.1)
SODIUM: 139 meq/L (ref 135–145)

## 2017-11-01 LAB — LIPID PANEL
CHOL/HDL RATIO: 4
Cholesterol: 249 mg/dL — ABNORMAL HIGH (ref 0–200)
HDL: 67.4 mg/dL (ref >39.00)
LDL CALC: 152 mg/dL — AB (ref 0–99)
NONHDL: 181.99
Triglycerides: 148 mg/dL (ref 0.0–149.0)
VLDL: 29.6 mg/dL (ref 0.0–40.0)

## 2017-11-01 LAB — HEMOGLOBIN A1C: HEMOGLOBIN A1C: 5.5 % (ref 4.6–6.5)

## 2017-11-01 NOTE — Telephone Encounter (Signed)
Copied from [place] [id number]. Topic: General - Other >> Nov 01, 2017  1:26 PM Ivar Drape wrote: Reason for CRM:   Patient would like a call back with the results of her recent Bone Density

## 2017-11-02 NOTE — Telephone Encounter (Signed)
LM for patient with results. CRM created  Component  HM Dexa Scan  osteoporosis   Comment:  follow BD in 2 years

## 2017-11-06 ENCOUNTER — Telehealth: Payer: Self-pay | Admitting: Internal Medicine

## 2017-11-06 NOTE — Telephone Encounter (Signed)
See TE for 5/29. Results dicussed with pt in office.

## 2017-11-06 NOTE — Telephone Encounter (Signed)
Pt came in office, results discussed. Aware that as soon as the paper results are scanned in we will be able to give her a copy. At this time we do not have a copy to give her. Nothing further needed.

## 2017-11-06 NOTE — Telephone Encounter (Signed)
Copied from [place]. Topic: Quick Communication - Other Results >> Nov 02, 2017  5:01 PM Virl Cagey, CMA wrote: Called patient to inform them of Bone Density results in 11/01/17 TE. When patient returns call, triage nurse may disclose results.

## 2017-11-08 ENCOUNTER — Encounter: Payer: Self-pay | Admitting: Internal Medicine

## 2017-11-10 DIAGNOSIS — M1712 Unilateral primary osteoarthritis, left knee: Secondary | ICD-10-CM | POA: Diagnosis not present

## 2017-11-10 DIAGNOSIS — M1711 Unilateral primary osteoarthritis, right knee: Secondary | ICD-10-CM | POA: Diagnosis not present

## 2017-11-13 ENCOUNTER — Encounter (INDEPENDENT_AMBULATORY_CARE_PROVIDER_SITE_OTHER): Payer: Self-pay

## 2017-11-13 ENCOUNTER — Encounter: Payer: Self-pay | Admitting: *Deleted

## 2017-11-13 NOTE — Telephone Encounter (Signed)
Patient came into office today requesting lab and bone density results. Dr. Regis Bill has been out of the office and has not yet reviewed her lab results. DEXA has not yet been scanned into chart.  Discussed labs w/ Dr. Regis Bill and decided to give patient copy of results and instructions that we will call her any changes that need to be made once Dr. Regis Bill review labs. Patient verbalized understanding of this plan and was very appreciative.

## 2017-11-14 DIAGNOSIS — L570 Actinic keratosis: Secondary | ICD-10-CM | POA: Diagnosis not present

## 2017-11-20 ENCOUNTER — Other Ambulatory Visit: Payer: Self-pay | Admitting: Internal Medicine

## 2017-11-22 NOTE — Telephone Encounter (Signed)
Pt seen 10/23/2017 for CPE Please advise Dr Regis Bill, thanks.

## 2017-11-23 NOTE — Telephone Encounter (Signed)
Refills sent electronically

## 2017-12-26 ENCOUNTER — Telehealth: Payer: Self-pay | Admitting: Emergency Medicine

## 2017-12-26 NOTE — Telephone Encounter (Signed)
Patient called and said that she wanted Dr. Lamonte Sakai to know that she does not feel that Flonase is working for her.  She said that she had switched from loratadine to zertec,at her last OV, and did not feel like that helped. She says that she still feels like her head is stuffed up all the time.  She wants Dr. Lamonte Sakai to recommend  or prescribe something.   Will route to Dr. Lamonte Sakai

## 2017-12-26 NOTE — Telephone Encounter (Signed)
Have her stop the zyrtec and try OTC allegra 180mg  daily  Write her for singulair 10mg  every evening.  Continue the flonase nasal spray for now, we may need to change later Ask her to try nasal saline washes once a day.

## 2017-12-27 MED ORDER — MONTELUKAST SODIUM 10 MG PO TABS: 10.0000 mg | ORAL_TABLET | Freq: Every day | ORAL | 3 refills | Status: DC

## 2017-12-27 MED ORDER — FLUTICASONE PROPIONATE 50 MCG/ACT NA SUSP: 2.0000 | Freq: Every day | NASAL | 5 refills | Status: DC

## 2017-12-27 NOTE — Telephone Encounter (Signed)
Attempted to call patient today regarding RB commendations below. I did not receive an answer at time of call. I have left a voicemail message for pt to return call. X1

## 2017-12-27 NOTE — Telephone Encounter (Signed)
Called and spoke with pt regarding RB recommendations Placed order for refill of flonase, and placed rx for singulair Pt verbalized understanding, had no further questions Nothing further needed.

## 2018-01-01 ENCOUNTER — Other Ambulatory Visit: Payer: Self-pay | Admitting: Family Medicine

## 2018-01-01 NOTE — Telephone Encounter (Signed)
Copied from [place] [id number]. Topic: Quick Communication - Rx Refill/Question >> Jan 01, 2018  9:58 AM Percell Belt A wrote: Medication: clonazePAM (KLONOPIN) 0.5 MG tablet [[id number]]- if this can be done for 90 day she would like a 90 day because it is cheaper  DULoxetine (CYMBALTA) 30 MG capsule [[id number]  Has the patient contacted their pharmacy? No  (Agent: If no, request that the patient contact the pharmacy for the refill.) (Agent: If yes, when and what did the pharmacy advise?) Preferred Pharmacy (with phone number or street name):  [facility] [id number] [place] [phone] (Phone)    Agent: Please be advised that RX refills may take up to 3 business days. We ask that you follow-up with your pharmacy.

## 2018-01-08 NOTE — Telephone Encounter (Signed)
Pt is calling to f.up on this request.  Pt states [facility] has not rec'd it yet.

## 2018-01-08 NOTE — Telephone Encounter (Signed)
LOV 10/23/17 Dr. Regis Bill Cymbalta refill 10/23/17 # 90 with 1 refill Klonopin  Refill 11/23/17  # 60 with 0 refill

## 2018-01-08 NOTE — Addendum Note (Signed)
Addended by: Linus Orn A on: 01/08/2018 11:28 AM   Modules accepted: Orders

## 2018-01-09 MED ORDER — DULOXETINE HCL 30 MG PO CPEP: 30.0000 mg | ORAL_CAPSULE | Freq: Every day | ORAL | 1 refills | Status: DC

## 2018-01-09 MED ORDER — CLONAZEPAM 0.5 MG PO TABS: ORAL_TABLET | ORAL | 0 refills | Status: DC

## 2018-01-09 NOTE — Telephone Encounter (Signed)
Sent in electronically .  

## 2018-01-09 NOTE — Telephone Encounter (Signed)
Please advise Dr Panosh, thanks.   

## 2018-01-09 NOTE — Addendum Note (Signed)
Addended byShanon Ace K on: 01/09/2018 10:07 AM   Modules accepted: Orders

## 2018-01-16 ENCOUNTER — Encounter: Payer: Self-pay | Admitting: Internal Medicine

## 2018-01-17 DIAGNOSIS — M1711 Unilateral primary osteoarthritis, right knee: Secondary | ICD-10-CM | POA: Diagnosis not present

## 2018-01-24 ENCOUNTER — Telehealth: Payer: Self-pay | Admitting: Emergency Medicine

## 2018-01-24 DIAGNOSIS — M1711 Unilateral primary osteoarthritis, right knee: Secondary | ICD-10-CM | POA: Diagnosis not present

## 2018-01-24 MED ORDER — MONTELUKAST SODIUM 10 MG PO TABS: 10.0000 mg | ORAL_TABLET | Freq: Every day | ORAL | 0 refills | Status: DC

## 2018-01-24 NOTE — Telephone Encounter (Signed)
[place].  For insurance to pay for her Singulair 10mg , she will have to have a 90 day supply.  Singulair 10mg , at bedtime, 90 day supply given. Nothing further at this time.

## 2018-01-31 DIAGNOSIS — M1711 Unilateral primary osteoarthritis, right knee: Secondary | ICD-10-CM | POA: Diagnosis not present

## 2018-02-14 DIAGNOSIS — L821 Other seborrheic keratosis: Secondary | ICD-10-CM | POA: Diagnosis not present

## 2018-02-14 DIAGNOSIS — L814 Other melanin hyperpigmentation: Secondary | ICD-10-CM | POA: Diagnosis not present

## 2018-02-14 DIAGNOSIS — D1801 Hemangioma of skin and subcutaneous tissue: Secondary | ICD-10-CM | POA: Diagnosis not present

## 2018-02-14 DIAGNOSIS — D229 Melanocytic nevi, unspecified: Secondary | ICD-10-CM | POA: Diagnosis not present

## 2018-02-14 DIAGNOSIS — L819 Disorder of pigmentation, unspecified: Secondary | ICD-10-CM | POA: Diagnosis not present

## 2018-02-14 DIAGNOSIS — L570 Actinic keratosis: Secondary | ICD-10-CM | POA: Diagnosis not present

## 2018-02-19 ENCOUNTER — Other Ambulatory Visit: Payer: Self-pay | Admitting: Emergency Medicine

## 2018-02-19 ENCOUNTER — Other Ambulatory Visit: Payer: Self-pay | Admitting: Internal Medicine

## 2018-02-20 ENCOUNTER — Ambulatory Visit: Payer: PPO | Admitting: *Deleted

## 2018-02-20 ENCOUNTER — Other Ambulatory Visit: Payer: Self-pay

## 2018-02-20 VITALS — Ht 59.0 in | Wt 123.4 lb

## 2018-02-20 DIAGNOSIS — Z86010 Personal history of colonic polyps: Secondary | ICD-10-CM

## 2018-02-20 NOTE — Progress Notes (Signed)
No egg or soy allergy known to patient  No issues with past sedation with any surgeries  or procedures, no intubation problems  No diet pills per patient No home 02 use per patient  No blood thinners per patient  Pt denies issues with constipation  No A fib or A flutter  EMMI video offered but declined by the patient 

## 2018-02-21 NOTE — Telephone Encounter (Signed)
Last filled 01/09/18 #60  Please advise Dr Regis Bill, thanks.

## 2018-02-23 NOTE — Telephone Encounter (Signed)
Sent in electronically .  

## 2018-03-06 ENCOUNTER — Encounter: Payer: PPO | Admitting: Internal Medicine

## 2018-03-12 ENCOUNTER — Encounter: Payer: PPO | Admitting: Internal Medicine

## 2018-03-15 DIAGNOSIS — M1711 Unilateral primary osteoarthritis, right knee: Secondary | ICD-10-CM | POA: Diagnosis not present

## 2018-03-22 ENCOUNTER — Ambulatory Visit (INDEPENDENT_AMBULATORY_CARE_PROVIDER_SITE_OTHER): Payer: PPO

## 2018-03-22 ENCOUNTER — Encounter: Payer: Self-pay | Admitting: Internal Medicine

## 2018-03-22 DIAGNOSIS — Z23 Encounter for immunization: Secondary | ICD-10-CM | POA: Diagnosis not present

## 2018-03-27 ENCOUNTER — Ambulatory Visit (INDEPENDENT_AMBULATORY_CARE_PROVIDER_SITE_OTHER)
Admission: RE | Admit: 2018-03-27 | Discharge: 2018-03-27 | Disposition: A | Discharge status: Home / Self Care | Payer: PPO | Source: Ambulatory Visit | Attending: Primary Care | Admitting: Primary Care

## 2018-03-27 ENCOUNTER — Encounter: Payer: Self-pay | Admitting: Primary Care

## 2018-03-27 ENCOUNTER — Ambulatory Visit: Payer: PPO | Admitting: Primary Care

## 2018-03-27 VITALS — BP 144/70 | HR 72 | Temp 96.9°F | Ht 59.75 in | Wt 124.4 lb

## 2018-03-27 DIAGNOSIS — J479 Bronchiectasis, uncomplicated: Secondary | ICD-10-CM

## 2018-03-27 LAB — NITRIC OXIDE: FeNO level (ppb): 12

## 2018-03-27 MED ORDER — BENZONATATE 200 MG PO CAPS: 200.0000 mg | ORAL_CAPSULE | Freq: Three times a day (TID) | ORAL | 1 refills | Status: DC | PRN

## 2018-03-27 NOTE — Patient Instructions (Addendum)
GERD: Continue Zantac twice daily Follow GERD diet   Cough: Delsym cough syrup twice daily Tessalon perle as needed three times (refill sent)  Allergies/Post nasal drip: Continue Flonase daily Singulair at bedtime  Zyrtec (antihisamine) daily   Orders: Check CXR today  FENO normal   Follow-up: 6 months with Dr. Lamonte Sakai
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
## 2018-04-01 ENCOUNTER — Encounter: Payer: Self-pay | Admitting: Primary Care

## 2018-04-01 NOTE — Assessment & Plan Note (Signed)
CXR 03/28/18 showed clear lungs, no active cardiopulmonary disease  Delsym cough syrup twice daily Tessalon perle as needed three times (refill sent)

## 2018-04-01 NOTE — Assessment & Plan Note (Signed)
Continue Flonase daily Singulair at bedtime  Zyrtec (antihisamine) daily

## 2018-04-01 NOTE — Assessment & Plan Note (Addendum)
Normal EGD in 2013 Continue Zantac twice daily Follow GERD diet

## 2018-04-05 ENCOUNTER — Encounter: Payer: Self-pay | Admitting: Internal Medicine

## 2018-04-05 ENCOUNTER — Ambulatory Visit (AMBULATORY_SURGERY_CENTER): Payer: PPO | Admitting: Internal Medicine

## 2018-04-05 ENCOUNTER — Encounter

## 2018-04-05 VITALS — BP 156/84 | HR 70 | Temp 97.5°F | Resp 11 | Ht 59.75 in | Wt 124.0 lb

## 2018-04-05 DIAGNOSIS — Z86010 Personal history of colonic polyps: Secondary | ICD-10-CM | POA: Diagnosis not present

## 2018-04-05 DIAGNOSIS — D122 Benign neoplasm of ascending colon: Secondary | ICD-10-CM | POA: Diagnosis not present

## 2018-04-05 DIAGNOSIS — D120 Benign neoplasm of cecum: Secondary | ICD-10-CM

## 2018-04-05 DIAGNOSIS — K635 Polyp of colon: Secondary | ICD-10-CM

## 2018-04-05 MED ORDER — SODIUM CHLORIDE 0.9 % IV SOLN: 500.0000 mL | Freq: Once | INTRAVENOUS | Status: DC

## 2018-04-05 NOTE — Progress Notes (Signed)
Called to room to assist during endoscopic procedure.  Patient ID and intended procedure confirmed with present staff. Received instructions for my participation in the procedure from the performing physician.  

## 2018-04-05 NOTE — Op Note (Signed)
[facility] Patient Name: Steffani Dionisio Procedure Date: 04/05/2018 4:02 PM MRN: [id number] Endoscopist: Gatha Mayer , MD Age: 77 Referring MD:  Date of Birth: [birth year] Gender: Female Account #: [id number] Procedure:                Colonoscopy Indications:              Surveillance: Personal history of adenomatous                            polyps on last colonoscopy 3 years ago Medicines:                Propofol per Anesthesia, Monitored Anesthesia Care Procedure:                Pre-Anesthesia Assessment:                           - Prior to the procedure, a History and Physical                            was performed, and patient medications and                            allergies were reviewed. The patient's tolerance of                            previous anesthesia was also reviewed. The risks                            and benefits of the procedure and the sedation                            options and risks were discussed with the patient.                            All questions were answered, and informed consent                            was obtained. Prior Anticoagulants: The patient has                            taken no previous anticoagulant or antiplatelet                            agents. ASA Grade Assessment: III - A patient with                            severe systemic disease. After reviewing the risks                            and benefits, the patient was deemed in                            satisfactory condition to undergo the procedure.  After obtaining informed consent, the colonoscope                            was passed under direct vision. Throughout the                            procedure, the patient's blood pressure, pulse, and                            oxygen saturations were monitored continuously. The                            Model CF-HQ190L [id number]) scope was introduced     through the anus and advanced to the the cecum,                            identified by appendiceal orifice and ileocecal                            valve. The colonoscopy was performed without                            difficulty. The patient tolerated the procedure                            well. The quality of the bowel preparation was                            excellent. The bowel preparation used was Miralax.                            The ileocecal valve, appendiceal orifice, and                            rectum were photographed. Scope In: 4:05:13 PM Scope Out: 4:23:59 PM Scope Withdrawal Time: 0 hours 14 minutes 53 seconds  Total Procedure Duration: 0 hours 18 minutes 46 seconds  Findings:                 The perianal and digital rectal examinations were                            normal.                           Three sessile polyps were found in the ascending                            colon and cecum. The polyps were diminutive in                            size. These polyps were removed with a cold snare.                            Resection and retrieval were  complete. Verification                            of patient identification for the specimen was                            done. Estimated blood loss was minimal.                           Multiple diverticula were found in the sigmoid                            colon.                           The exam was otherwise without abnormality on                            direct and retroflexion views. Complications:            No immediate complications. Estimated Blood Loss:     Estimated blood loss was minimal. Impression:               - Three diminutive polyps in the ascending colon                            and in the cecum, removed with a cold snare.                            Resected and retrieved.                           - Diverticulosis in the sigmoid colon.                           - The examination  was otherwise normal on direct                            and retroflexion views.                           - Personal history of colonic polyps. Recommendation:           - Patient has a contact number available for                            emergencies. The signs and symptoms of potential                            delayed complications were discussed with the                            patient. Return to normal activities tomorrow.                            Written discharge instructions were provided to the  patient.                           - Resume previous diet.                           - Continue present medications.                           - No repeat colonoscopy due to age. Gatha Mayer, MD 04/05/2018 4:30:35 PM This report has been signed electronically.

## 2018-04-05 NOTE — Patient Instructions (Addendum)
I found and removed 3 tiny polyps.  I do not think I will be recommending another routine colonoscopy.  I will let you know the pathology results.  I appreciate the opportunity to care for you. Gatha Mayer, MD, FACG   INFORMATION ON POLYPS AND DIVERTICULOSIS GIVEN TO YOU TODAY  AWAIT PATHOLOGY RESULTS ON POLYPS REMOVED TODAY   YOU HAD AN ENDOSCOPIC PROCEDURE TODAY AT [facility]:   Refer to the procedure report that was given to you for any specific questions about what was found during the examination.  If the procedure report does not answer your questions, please call your gastroenterologist to clarify.  If you requested that your care partner not be given the details of your procedure findings, then the procedure report has been included in a sealed envelope for you to review at your convenience later.  YOU SHOULD EXPECT: Some feelings of bloating in the abdomen. Passage of more gas than usual.  Walking can help get rid of the air that was put into your GI tract during the procedure and reduce the bloating. If you had a lower endoscopy (such as a colonoscopy or flexible sigmoidoscopy) you may notice spotting of blood in your stool or on the toilet paper. If you underwent a bowel prep for your procedure, you may not have a normal bowel movement for a few days.  Please Note:  You might notice some irritation and congestion in your nose or some drainage.  This is from the oxygen used during your procedure.  There is no need for concern and it should clear up in a day or so.  SYMPTOMS TO REPORT IMMEDIATELY:   Following lower endoscopy (colonoscopy or flexible sigmoidoscopy):  Excessive amounts of blood in the stool  Significant tenderness or worsening of abdominal pains  Swelling of the abdomen that is new, acute  Fever of 100F or higher    For urgent or emergent issues, a gastroenterologist can be reached at any hour by calling [phone]  [phone].   DIET:  We do recommend a small meal at first, but then you may proceed to your regular diet.  Drink plenty of fluids but you should avoid alcoholic beverages for 24 hours.  ACTIVITY:  You should plan to take it easy for the rest of today and you should NOT DRIVE or use heavy machinery until tomorrow (because of the sedation medicines used during the test).    FOLLOW UP: Our staff will call the number listed on your records the next business day following your procedure to check on you and address any questions or concerns that you may have regarding the information given to you following your procedure. If we do not reach you, we will leave a message.  However, if you are feeling well and you are not experiencing any problems, there is no need to return our call.  We will assume that you have returned to your regular daily activities without incident.  If any biopsies were taken you will be contacted by phone or by letter within the next 1-3 weeks.  Please call us at [phone] if you have not heard about the biopsies in 3 weeks.    SIGNATURES/CONFIDENTIALITY: You and/or your care partner have signed paperwork which will be entered into your electronic medical record.  These signatures attest to the fact that that the information above on your After Visit Summary has been reviewed and is understood.  Full responsibility of the confidentiality of  this discharge information lies with you and/or your care-partner.

## 2018-04-05 NOTE — Progress Notes (Signed)
PT taken to PACU. Monitors in place. VSS. Report given to RN. 

## 2018-04-06 ENCOUNTER — Telehealth: Payer: Self-pay | Admitting: *Deleted

## 2018-04-06 NOTE — Telephone Encounter (Signed)
  Follow up Call-  Call back number 04/05/2018  Post procedure Call Back phone  # [phone]  Permission to leave phone message Yes  Some recent data might be hidden     Patient questions:  Do you have a fever, pain , or abdominal swelling? No. Pain Score  0 *  Have you tolerated food without any problems? Yes.    Have you been able to return to your normal activities? yes  Do you have any questions about your discharge instructions: Diet   No. Medications  No. Follow up visit  No.  Do you have questions or concerns about your Care? No.  Actions: * If pain score is 4 or above: No action needed, pain <4.

## 2018-04-09 ENCOUNTER — Other Ambulatory Visit: Payer: Self-pay | Admitting: Family Medicine

## 2018-04-09 NOTE — Telephone Encounter (Signed)
[facility] patient  Last OV 10/23/17, No future OV  Last filled for 60 mg 01/02/18, # 90 with 0 refill

## 2018-04-12 ENCOUNTER — Other Ambulatory Visit: Payer: Self-pay | Admitting: Internal Medicine

## 2018-04-23 ENCOUNTER — Other Ambulatory Visit: Payer: Self-pay | Admitting: Emergency Medicine

## 2018-04-23 ENCOUNTER — Other Ambulatory Visit: Payer: Self-pay | Admitting: Internal Medicine

## 2018-04-25 ENCOUNTER — Encounter: Payer: Self-pay | Admitting: Internal Medicine

## 2018-04-25 NOTE — Progress Notes (Signed)
3 diminutive adenomas No recall due to age My chart letter

## 2018-04-27 NOTE — Telephone Encounter (Signed)
Please advise Dr Regis Bill, thanks.  Patient aware with other refills sent that she needs to make an OV for further refills. Others filled for 30 day only.

## 2018-05-15 ENCOUNTER — Other Ambulatory Visit: Payer: Self-pay | Admitting: Internal Medicine

## 2018-06-07 DIAGNOSIS — Z1231 Encounter for screening mammogram for malignant neoplasm of breast: Secondary | ICD-10-CM | POA: Diagnosis not present

## 2018-06-07 LAB — HM MAMMOGRAPHY

## 2018-06-14 ENCOUNTER — Other Ambulatory Visit: Payer: Self-pay | Admitting: Internal Medicine

## 2018-06-14 NOTE — Telephone Encounter (Signed)
Pt is requesting refill on Clonazepam and duloxetine  Please advise Last filled :04/07/18 for both  Last OV:10/23/17

## 2018-06-17 ENCOUNTER — Other Ambulatory Visit: Payer: Self-pay | Admitting: Internal Medicine

## 2018-06-18 ENCOUNTER — Other Ambulatory Visit: Payer: Self-pay

## 2018-06-18 MED ORDER — CLONAZEPAM 0.5 MG PO TABS: ORAL_TABLET | ORAL | 0 refills | Status: DC

## 2018-06-18 MED ORDER — DULOXETINE HCL 60 MG PO CPEP: 60.0000 mg | ORAL_CAPSULE | Freq: Every day | ORAL | 0 refills | Status: DC

## 2018-06-18 NOTE — Telephone Encounter (Signed)
She is over due for   6 mos med check  Make her appt for med check and then refill each   X 1

## 2018-06-18 NOTE — Telephone Encounter (Signed)
Pt has made an appt refills sent

## 2018-06-18 NOTE — Addendum Note (Signed)
Addended by: Modena Morrow R on: 06/18/2018 11:29 AM   Modules accepted: Orders

## 2018-06-19 ENCOUNTER — Ambulatory Visit (INDEPENDENT_AMBULATORY_CARE_PROVIDER_SITE_OTHER): Payer: PPO | Admitting: Internal Medicine

## 2018-06-19 ENCOUNTER — Encounter: Payer: Self-pay | Admitting: Internal Medicine

## 2018-06-19 VITALS — BP 138/84 | HR 87 | Temp 98.2°F | Wt 122.2 lb

## 2018-06-19 DIAGNOSIS — Z634 Disappearance and death of family member: Secondary | ICD-10-CM

## 2018-06-19 DIAGNOSIS — Z79899 Other long term (current) drug therapy: Secondary | ICD-10-CM

## 2018-06-19 DIAGNOSIS — M810 Age-related osteoporosis without current pathological fracture: Secondary | ICD-10-CM | POA: Diagnosis not present

## 2018-06-19 DIAGNOSIS — G609 Hereditary and idiopathic neuropathy, unspecified: Secondary | ICD-10-CM

## 2018-06-19 DIAGNOSIS — I10 Essential (primary) hypertension: Secondary | ICD-10-CM

## 2018-06-19 DIAGNOSIS — G4762 Sleep related leg cramps: Secondary | ICD-10-CM | POA: Diagnosis not present

## 2018-06-19 DIAGNOSIS — Z789 Other specified health status: Secondary | ICD-10-CM | POA: Diagnosis not present

## 2018-06-19 MED ORDER — RISEDRONATE SODIUM 35 MG PO TABS: 35.0000 mg | ORAL_TABLET | ORAL | 5 refills | Status: DC

## 2018-06-19 MED ORDER — LOSARTAN POTASSIUM 50 MG PO TABS: ORAL_TABLET | ORAL | 2 refills | Status: DC
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
## 2018-06-19 NOTE — Patient Instructions (Addendum)
Ok to continue medication.   Plan  cpx in 6-7 months   consideration of  fosomax       But not usually do if have sever  reflux problems .   Other options . If needed.     Bone Health Bones protect organs, store calcium, anchor muscles, and support the whole body. Keeping your bones strong is important, especially as you get older. You can take actions to help keep your bones strong and healthy. Why is keeping my bones healthy important?  Keeping your bones healthy is important because your body constantly replaces bone cells. Cells get old, and new cells take their place. As we age, we lose bone cells because the body may not be able to make enough new cells to replace the old cells. The amount of bone cells and bone tissue you have is referred to as bone mass. The higher your bone mass, the stronger your bones. The aging process leads to an overall loss of bone mass in the body, which can increase the likelihood of:  Joint pain and stiffness.  Broken bones.  A condition in which the bones become weak and brittle (osteoporosis). A large decline in bone mass occurs in older adults. In women, it occurs about the time of menopause. What actions can I take to keep my bones healthy? Good health habits are important for maintaining healthy bones. This includes eating nutritious foods and exercising regularly. To have healthy bones, you need to get enough of the right minerals and vitamins. Most nutrition experts recommend getting these nutrients from the foods that you eat. In some cases, taking supplements may also be recommended. Doing certain types of exercise is also important for bone health. What are the nutritional recommendations for healthy bones?  Eating a well-balanced diet with plenty of calcium and vitamin D will help to protect your bones. Nutritional recommendations vary from person to person. Ask your health care provider what is healthy for you. Here are some general  guidelines. Get enough calcium Calcium is the most important (essential) mineral for bone health. Most people can get enough calcium from their diet, but supplements may be recommended for people who are at risk for osteoporosis. Good sources of calcium include:  Dairy products, such as low-fat or nonfat milk, cheese, and yogurt.  Dark [place] leafy vegetables, such as bok choy and broccoli.  Calcium-fortified foods, such as orange juice, cereal, bread, soy beverages, and tofu products.  Nuts, such as almonds. Follow these recommended amounts for daily calcium intake:  Children, age 775-3: 700 mg.  Children, age 77-8: 1,000 mg.  Children, age 35-13: 1,300 mg.  Teens, age 778-18: 1,300 mg.  Adults, age 22-50: 1,000 mg.  Adults, age 24-70: ? Men: 1,000 mg. ? Women: 1,200 mg.  Adults, age 29 or older: 1,200 mg.  Pregnant and breastfeeding females: ? Teens: 1,300 mg. ? Adults: 1,000 mg. Get enough vitamin D Vitamin D is the most essential vitamin for bone health. It helps the body absorb calcium. Sunlight stimulates the skin to make vitamin D, so be sure to get enough sunlight. If you live in a cold climate or you do not get outside often, your health care provider may recommend that you take vitamin D supplements. Good sources of vitamin D in your diet include:  Egg yolks.  Saltwater fish.  Milk and cereal fortified with vitamin D. Follow these recommended amounts for daily vitamin D intake:  Children and teens, age 775-18: 600 international units.  Adults, age 26 or younger: 400-800 international units.  Adults, age 39 or older: 800-1,000 international units. Get other important nutrients Other nutrients that are important for bone health include:  Phosphorus. This mineral is found in meat, poultry, dairy foods, nuts, and legumes. The recommended daily intake for adult men and adult women is 700 mg.  Magnesium. This mineral is found in seeds, nuts, dark [place] vegetables, and  legumes. The recommended daily intake for adult men is 400-420 mg. For adult women, it is 310-320 mg.  Vitamin K. This vitamin is found in [place] leafy vegetables. The recommended daily intake is 120 mg for adult men and 90 mg for adult women. What type of physical activity is best for building and maintaining healthy bones? Weight-bearing and strength-building activities are important for building and maintaining healthy bones. Weight-bearing activities cause muscles and bones to work against gravity. Strength-building activities increase the strength of the muscles that support bones. Weight-bearing and muscle-building activities include:  Walking and hiking.  Jogging and running.  Dancing.  Gym exercises.  Lifting weights.  Tennis and racquetball.  Climbing stairs.  Aerobics. Adults should get at least 30 minutes of moderate physical activity on most days. Children should get at least 60 minutes of moderate physical activity on most days. Ask your health care provider what type of exercise is best for you. How can I find out if my bone mass is low? Bone mass can be measured with an X-ray test called a bone mineral density (BMD) test. This test is recommended for all women who are age 66 or older. It may also be recommended for:  Men who are age 64 or older.  People who are at risk for osteoporosis because of: ? Having bones that break easily. ? Having a long-term disease that weakens bones, such as kidney disease or rheumatoid arthritis. ? Having menopause earlier than normal. ? Taking medicine that weakens bones, such as steroids, thyroid hormones, or hormone treatment for breast cancer or prostate cancer. ? Smoking. ? Drinking three or more alcoholic drinks a day. If you find that you have a low bone mass, you may be able to prevent osteoporosis or further bone loss by changing your diet and lifestyle. Where can I find more information? For more information, check out the  following websites:  [place]: AviationTales.fr  [employer] of Health: www.bones.SouthExposed.es  International Osteoporosis Foundation: Administrator.iofbonehealth.org Summary  The aging process leads to an overall loss of bone mass in the body, which can increase the likelihood of broken bones and osteoporosis.  Eating a well-balanced diet with plenty of calcium and vitamin D will help to protect your bones.  Weight-bearing and strength-building activities are also important for building and maintaining strong bones.  Bone mass can be measured with an X-ray test called a bone mineral density (BMD) test. This information is not intended to replace advice given to you by your health care provider. Make sure you discuss any questions you have with your health care provider. Document Released: 08/13/2003 Document Revised: 06/19/2017 Document Reviewed: 06/19/2017 Elsevier Interactive Patient Education  2019 [employer].

## 2018-06-22 ENCOUNTER — Encounter: Payer: Self-pay | Admitting: Internal Medicine

## 2018-07-10 DIAGNOSIS — Z85828 Personal history of other malignant neoplasm of skin: Secondary | ICD-10-CM | POA: Diagnosis not present

## 2018-07-10 DIAGNOSIS — L821 Other seborrheic keratosis: Secondary | ICD-10-CM | POA: Diagnosis not present

## 2018-07-10 DIAGNOSIS — D485 Neoplasm of uncertain behavior of skin: Secondary | ICD-10-CM | POA: Diagnosis not present

## 2018-07-10 DIAGNOSIS — L570 Actinic keratosis: Secondary | ICD-10-CM | POA: Diagnosis not present

## 2018-07-10 DIAGNOSIS — L820 Inflamed seborrheic keratosis: Secondary | ICD-10-CM | POA: Diagnosis not present

## 2018-07-10 DIAGNOSIS — L738 Other specified follicular disorders: Secondary | ICD-10-CM | POA: Diagnosis not present

## 2018-07-11 ENCOUNTER — Other Ambulatory Visit: Payer: Self-pay | Admitting: Internal Medicine

## 2018-07-12 ENCOUNTER — Other Ambulatory Visit: Payer: Self-pay | Admitting: Internal Medicine

## 2018-07-13 NOTE — Telephone Encounter (Signed)
Sent in electronically .  

## 2018-07-13 NOTE — Telephone Encounter (Signed)
Last OV: 06/19/2018 Last filled: 06/18/2018 Upcoming appt:09/26/2018

## 2018-08-17 ENCOUNTER — Other Ambulatory Visit: Payer: Self-pay | Admitting: Internal Medicine

## 2018-08-30 ENCOUNTER — Telehealth: Payer: Self-pay

## 2018-08-30 NOTE — Telephone Encounter (Signed)
Author phone pt. to offer virtual awv. No answer. Author left detailed VM relaying options and asking for return call to specify preference.

## 2018-09-12 ENCOUNTER — Other Ambulatory Visit: Payer: Self-pay | Admitting: Internal Medicine

## 2018-09-26 ENCOUNTER — Ambulatory Visit: Payer: PPO

## 2018-09-26 ENCOUNTER — Telehealth: Payer: Self-pay | Admitting: *Deleted

## 2018-09-26 DIAGNOSIS — Z85828 Personal history of other malignant neoplasm of skin: Secondary | ICD-10-CM | POA: Diagnosis not present

## 2018-09-26 DIAGNOSIS — L570 Actinic keratosis: Secondary | ICD-10-CM | POA: Diagnosis not present

## 2018-09-26 NOTE — Telephone Encounter (Signed)
Reached out to pt. Pt was on health coach schedule pt will call back to reschedule

## 2018-09-26 NOTE — Telephone Encounter (Signed)
Copied from [place] [id number]. Topic: Appointment Scheduling - Scheduling Inquiry for Clinic >> Sep 26, 2018  3:15 PM Virl Axe D wrote: Reason for CRM: Pt left vm regarding appointment today at 2pm. She stated she waited an hour and never heard anything and had to leave. Please reach out to pt. CB#[phone]

## 2018-10-11 DIAGNOSIS — M170 Bilateral primary osteoarthritis of knee: Secondary | ICD-10-CM | POA: Diagnosis not present

## 2018-10-11 DIAGNOSIS — Z85828 Personal history of other malignant neoplasm of skin: Secondary | ICD-10-CM | POA: Diagnosis not present

## 2018-10-11 DIAGNOSIS — M1711 Unilateral primary osteoarthritis, right knee: Secondary | ICD-10-CM | POA: Diagnosis not present

## 2018-10-11 DIAGNOSIS — L570 Actinic keratosis: Secondary | ICD-10-CM | POA: Diagnosis not present

## 2018-10-11 DIAGNOSIS — M1712 Unilateral primary osteoarthritis, left knee: Secondary | ICD-10-CM | POA: Diagnosis not present

## 2018-11-04 ENCOUNTER — Other Ambulatory Visit: Payer: Self-pay | Admitting: Internal Medicine

## 2018-11-05 NOTE — Telephone Encounter (Signed)
Please fill in absence of Dr.Panosh thank you

## 2018-11-06 ENCOUNTER — Other Ambulatory Visit: Payer: Self-pay | Admitting: Internal Medicine

## 2018-11-06 NOTE — Telephone Encounter (Signed)
Refill has been sent in.  

## 2018-11-06 NOTE — Telephone Encounter (Signed)
Call in #[phone] with no rf 

## 2018-11-06 NOTE — Telephone Encounter (Signed)
Pt wants 90 day refill for all this medications.

## 2018-11-07 ENCOUNTER — Telehealth: Payer: Self-pay | Admitting: Emergency Medicine

## 2018-11-07 MED ORDER — DOXYCYCLINE HYCLATE 100 MG PO TABS: 100.0000 mg | ORAL_TABLET | Freq: Two times a day (BID) | ORAL | 0 refills | Status: DC

## 2018-11-07 MED ORDER — BENZONATATE 200 MG PO CAPS: 200.0000 mg | ORAL_CAPSULE | Freq: Three times a day (TID) | ORAL | 1 refills | Status: DC | PRN

## 2018-11-07 NOTE — Telephone Encounter (Signed)
Call in Clonazepam #30 with no rf

## 2018-11-07 NOTE — Telephone Encounter (Signed)
Antibiotic sent. Make sure she is taking mucinex twice a day. Flutter valve if she has one. Tessalon perles sent. If no improvement needs visit.

## 2018-11-07 NOTE — Telephone Encounter (Signed)
Called and spoke with Patient.  EW recommendations given.  Understanding stated.  Nothing further at this time.

## 2018-11-07 NOTE — Telephone Encounter (Signed)
Primary Pulmonologist: RB Last office visit and with whom: EW, 03/27/18 What do we see them for (pulmonary problems): bronchiectasis  Last OV assessment/plan:   Instructions   GERD: Continue Zantac twice daily Follow GERD diet   Cough: Delsym cough syrup twice daily Tessalon perle as needed three times (refill sent)  Allergies/Post nasal drip: Continue Flonase daily Singulair at bedtime  Zyrtec (antihisamine) daily   Orders: Check CXR today  FENO normal   Follow-up: 6 months with Dr. Lamonte Sakai         Was appointment offered to patient (explain)? Patient does not want OV if possible   Reason for call:  Called and spoke with Patient.  Patient stated she is having a productive cough, with Christina mucus, that started 1 week ago, and is not getting better. Patient is requesting a antibiotic and tessalon to be called in at [facility]. Patient stated RB gave her a prescription of tessalon to have as needed, and she is currently out. Patient denies any OTC medications for cough.  Patient denies any fever, chills, or symptoms of Covid 19.  Patient stated she is staying home to avoid Covid risk.  Message routed to [facility], NP to advise

## 2018-11-07 NOTE — Telephone Encounter (Signed)
Pt can not have 90 day supply of this medication since it is a controlled drug
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
## 2018-11-14 ENCOUNTER — Encounter: Payer: Self-pay | Admitting: Internal Medicine

## 2018-11-14 ENCOUNTER — Ambulatory Visit (INDEPENDENT_AMBULATORY_CARE_PROVIDER_SITE_OTHER): Payer: PPO | Admitting: Internal Medicine

## 2018-11-14 ENCOUNTER — Other Ambulatory Visit: Payer: Self-pay

## 2018-11-14 DIAGNOSIS — F988 Other specified behavioral and emotional disorders with onset usually occurring in childhood and adolescence: Secondary | ICD-10-CM

## 2018-11-14 DIAGNOSIS — Z0000 Encounter for general adult medical examination without abnormal findings: Secondary | ICD-10-CM

## 2018-11-14 DIAGNOSIS — Z79899 Other long term (current) drug therapy: Secondary | ICD-10-CM

## 2018-11-14 DIAGNOSIS — M810 Age-related osteoporosis without current pathological fracture: Secondary | ICD-10-CM | POA: Diagnosis not present

## 2018-11-14 DIAGNOSIS — Z789 Other specified health status: Secondary | ICD-10-CM | POA: Diagnosis not present

## 2018-11-14 DIAGNOSIS — G4762 Sleep related leg cramps: Secondary | ICD-10-CM

## 2018-11-14 DIAGNOSIS — E785 Hyperlipidemia, unspecified: Secondary | ICD-10-CM | POA: Diagnosis not present

## 2018-11-14 DIAGNOSIS — G609 Hereditary and idiopathic neuropathy, unspecified: Secondary | ICD-10-CM | POA: Diagnosis not present

## 2018-11-14 DIAGNOSIS — I10 Essential (primary) hypertension: Secondary | ICD-10-CM | POA: Diagnosis not present

## 2018-11-14 MED ORDER — CLONAZEPAM 0.5 MG PO TABS: ORAL_TABLET | ORAL | 0 refills | Status: DC

## 2018-11-14 MED ORDER — DULOXETINE HCL 60 MG PO CPEP: 60.0000 mg | ORAL_CAPSULE | Freq: Every day | ORAL | 1 refills | Status: DC

## 2018-12-15 ENCOUNTER — Other Ambulatory Visit: Payer: Self-pay | Admitting: Family Medicine

## 2018-12-15 ENCOUNTER — Other Ambulatory Visit: Payer: Self-pay | Admitting: Internal Medicine

## 2018-12-26 NOTE — Telephone Encounter (Signed)
Last filled 11/14/2018 Last OV 11/14/2018  Ok to fill?

## 2019-01-02 NOTE — Telephone Encounter (Signed)
Last rx should have lasted 3 mpnth done 6 10  Please advise    Why getting this request  May need to  Decline this if this is true

## 2019-01-02 NOTE — Telephone Encounter (Signed)
This is a Dr. Regis Bill patient

## 2019-01-04 ENCOUNTER — Other Ambulatory Visit: Payer: Self-pay | Admitting: Internal Medicine

## 2019-01-04 MED ORDER — DULOXETINE HCL 60 MG PO CPEP: 60.0000 mg | ORAL_CAPSULE | Freq: Two times a day (BID) | ORAL | 1 refills | Status: DC

## 2019-01-04 NOTE — Telephone Encounter (Signed)
Refill was sent in 

## 2019-01-04 NOTE — Telephone Encounter (Signed)
Medication Refill - Medication: DULoxetine (CYMBALTA) 60 MG capsule  Has the patient contacted their pharmacy? Yes.  Pt wants this written as take twice a day and she wants a 90 day supply (Agent: If no, request that the patient contact the pharmacy for the refill.) (Agent: If yes, when and what did the pharmacy advise?)  Preferred Pharmacy (with phone number or street name):  [facility] [id number], [place] [phone] (Phone) [phone] (Fax)   Agent: Please be advised that RX refills may take up to 3 business days. We ask that you follow-up with your pharmacy.

## 2019-02-05 ENCOUNTER — Other Ambulatory Visit: Payer: Self-pay | Admitting: Internal Medicine

## 2019-02-07 DIAGNOSIS — H903 Sensorineural hearing loss, bilateral: Secondary | ICD-10-CM | POA: Diagnosis not present

## 2019-03-11 ENCOUNTER — Encounter: Payer: Self-pay | Admitting: Family Medicine

## 2019-03-11 ENCOUNTER — Other Ambulatory Visit: Payer: Self-pay | Admitting: Internal Medicine

## 2019-03-11 ENCOUNTER — Other Ambulatory Visit: Payer: Self-pay

## 2019-03-11 ENCOUNTER — Other Ambulatory Visit: Payer: Self-pay | Admitting: Emergency Medicine

## 2019-03-11 ENCOUNTER — Ambulatory Visit (INDEPENDENT_AMBULATORY_CARE_PROVIDER_SITE_OTHER): Payer: PPO | Admitting: Family Medicine

## 2019-03-11 VITALS — BP 140/68 | HR 84 | Temp 97.7°F | Wt 129.4 lb

## 2019-03-11 DIAGNOSIS — R300 Dysuria: Secondary | ICD-10-CM | POA: Diagnosis not present

## 2019-03-11 LAB — POCT URINALYSIS DIPSTICK
Bilirubin, UA: NEGATIVE
Glucose, UA: NEGATIVE
Ketones, UA: NEGATIVE
Nitrite, UA: POSITIVE
Protein, UA: POSITIVE — AB
Spec Grav, UA: 1.01 (ref 1.010–1.025)
Urobilinogen, UA: 1 E.U./dL
pH, UA: 6.5 (ref 5.0–8.0)

## 2019-03-11 MED ORDER — CEPHALEXIN 500 MG PO CAPS: 500.0000 mg | ORAL_CAPSULE | Freq: Three times a day (TID) | ORAL | 0 refills | Status: DC

## 2019-03-11 NOTE — Patient Instructions (Signed)
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
## 2019-03-13 LAB — URINE CULTURE
MICRO NUMBER:: 954519
SPECIMEN QUALITY:: ADEQUATE

## 2019-03-26 DIAGNOSIS — L814 Other melanin hyperpigmentation: Secondary | ICD-10-CM | POA: Diagnosis not present

## 2019-03-26 DIAGNOSIS — L720 Epidermal cyst: Secondary | ICD-10-CM | POA: Diagnosis not present

## 2019-03-26 DIAGNOSIS — L820 Inflamed seborrheic keratosis: Secondary | ICD-10-CM | POA: Diagnosis not present

## 2019-03-26 DIAGNOSIS — Z85828 Personal history of other malignant neoplasm of skin: Secondary | ICD-10-CM | POA: Diagnosis not present

## 2019-03-26 DIAGNOSIS — L821 Other seborrheic keratosis: Secondary | ICD-10-CM | POA: Diagnosis not present

## 2019-03-26 DIAGNOSIS — D1801 Hemangioma of skin and subcutaneous tissue: Secondary | ICD-10-CM | POA: Diagnosis not present

## 2019-03-26 DIAGNOSIS — L570 Actinic keratosis: Secondary | ICD-10-CM | POA: Diagnosis not present

## 2019-04-01 DIAGNOSIS — M7542 Impingement syndrome of left shoulder: Secondary | ICD-10-CM | POA: Diagnosis not present

## 2019-04-01 DIAGNOSIS — M25512 Pain in left shoulder: Secondary | ICD-10-CM | POA: Diagnosis not present

## 2019-04-03 DIAGNOSIS — M25562 Pain in left knee: Secondary | ICD-10-CM | POA: Diagnosis not present

## 2019-04-03 DIAGNOSIS — M170 Bilateral primary osteoarthritis of knee: Secondary | ICD-10-CM | POA: Diagnosis not present

## 2019-04-03 DIAGNOSIS — M25561 Pain in right knee: Secondary | ICD-10-CM | POA: Diagnosis not present

## 2019-04-03 DIAGNOSIS — M1712 Unilateral primary osteoarthritis, left knee: Secondary | ICD-10-CM | POA: Diagnosis not present

## 2019-04-05 ENCOUNTER — Other Ambulatory Visit: Payer: Self-pay | Admitting: Internal Medicine

## 2019-04-05 ENCOUNTER — Other Ambulatory Visit: Payer: Self-pay | Admitting: Primary Care

## 2019-04-05 ENCOUNTER — Other Ambulatory Visit: Payer: Self-pay | Admitting: Emergency Medicine

## 2019-04-29 DIAGNOSIS — M25512 Pain in left shoulder: Secondary | ICD-10-CM | POA: Diagnosis not present

## 2019-04-29 DIAGNOSIS — M7542 Impingement syndrome of left shoulder: Secondary | ICD-10-CM | POA: Diagnosis not present

## 2019-05-11 ENCOUNTER — Other Ambulatory Visit: Payer: Self-pay | Admitting: Internal Medicine

## 2019-05-13 NOTE — Telephone Encounter (Signed)
Last ov:11/14/2018 Last filled :11/14/2018 Upcoming:05/15/2019
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
## 2019-05-14 ENCOUNTER — Other Ambulatory Visit: Payer: Self-pay

## 2019-05-15 ENCOUNTER — Encounter: Payer: Self-pay | Admitting: Internal Medicine

## 2019-05-15 ENCOUNTER — Ambulatory Visit (INDEPENDENT_AMBULATORY_CARE_PROVIDER_SITE_OTHER): Payer: PPO | Admitting: Internal Medicine

## 2019-05-15 VITALS — BP 132/64 | HR 80 | Temp 97.6°F | Wt 128.6 lb

## 2019-05-15 DIAGNOSIS — Z79899 Other long term (current) drug therapy: Secondary | ICD-10-CM

## 2019-05-15 DIAGNOSIS — R3915 Urgency of urination: Secondary | ICD-10-CM

## 2019-05-15 DIAGNOSIS — F988 Other specified behavioral and emotional disorders with onset usually occurring in childhood and adolescence: Secondary | ICD-10-CM | POA: Diagnosis not present

## 2019-05-15 DIAGNOSIS — Z01818 Encounter for other preprocedural examination: Secondary | ICD-10-CM

## 2019-05-15 DIAGNOSIS — G609 Hereditary and idiopathic neuropathy, unspecified: Secondary | ICD-10-CM | POA: Diagnosis not present

## 2019-05-15 DIAGNOSIS — E785 Hyperlipidemia, unspecified: Secondary | ICD-10-CM | POA: Diagnosis not present

## 2019-05-15 DIAGNOSIS — G4762 Sleep related leg cramps: Secondary | ICD-10-CM

## 2019-05-15 DIAGNOSIS — M810 Age-related osteoporosis without current pathological fracture: Secondary | ICD-10-CM

## 2019-05-15 DIAGNOSIS — Z789 Other specified health status: Secondary | ICD-10-CM

## 2019-05-15 DIAGNOSIS — M1711 Unilateral primary osteoarthritis, right knee: Secondary | ICD-10-CM | POA: Diagnosis not present

## 2019-05-15 DIAGNOSIS — I10 Essential (primary) hypertension: Secondary | ICD-10-CM

## 2019-05-15 LAB — POC URINALSYSI DIPSTICK (AUTOMATED)
Bilirubin, UA: NEGATIVE
Blood, UA: NEGATIVE
Glucose, UA: NEGATIVE
Ketones, UA: NEGATIVE
Leukocytes, UA: NEGATIVE
Nitrite, UA: NEGATIVE
Protein, UA: NEGATIVE
Spec Grav, UA: 1.02 (ref 1.010–1.025)
Urobilinogen, UA: 0.2 E.U./dL
pH, UA: 6.5 (ref 5.0–8.0)

## 2019-05-15 NOTE — Patient Instructions (Signed)
Medically ok for surgery  Good luck with rehab.  Get blood tests and  Urine test today .  Will send from to your surgeon.  Plan ROV  In 6 months or as needed .  In future consider physical therapy for   UI

## 2019-05-16 LAB — CBC WITH DIFFERENTIAL/PLATELET
Basophils Absolute: 0.1 10*3/uL (ref 0.0–0.1)
Basophils Relative: 1.1 % (ref 0.0–3.0)
Eosinophils Absolute: 0.1 10*3/uL (ref 0.0–0.7)
Eosinophils Relative: 1.8 % (ref 0.0–5.0)
HCT: 41.5 % (ref 36.0–46.0)
Hemoglobin: 13.7 g/dL (ref 12.0–15.0)
Lymphocytes Relative: 33.9 % (ref 12.0–46.0)
Lymphs Abs: 2.4 10*3/uL (ref 0.7–4.0)
MCHC: 33.1 g/dL (ref 30.0–36.0)
MCV: 96 fl (ref 78.0–100.0)
Monocytes Absolute: 0.6 10*3/uL (ref 0.1–1.0)
Monocytes Relative: 8.1 % (ref 3.0–12.0)
Neutro Abs: 4 10*3/uL (ref 1.4–7.7)
Neutrophils Relative %: 55.1 % (ref 43.0–77.0)
Platelets: 255 10*3/uL (ref 150.0–400.0)
RBC: 4.32 Mil/uL (ref 3.87–5.11)
RDW: 14 % (ref 11.5–15.5)
WBC: 7.2 10*3/uL (ref 4.0–10.5)

## 2019-05-16 LAB — HEPATIC FUNCTION PANEL
ALT: 15 U/L (ref 0–35)
AST: 16 U/L (ref 0–37)
Albumin: 4.3 g/dL (ref 3.5–5.2)
Alkaline Phosphatase: 53 U/L (ref 39–117)
Bilirubin, Direct: 0 mg/dL (ref 0.0–0.3)
Total Bilirubin: 0.3 mg/dL (ref 0.2–1.2)
Total Protein: 6.5 g/dL (ref 6.0–8.3)

## 2019-05-16 LAB — LIPID PANEL
Cholesterol: 262 mg/dL — ABNORMAL HIGH (ref 0–200)
HDL: 63.6 mg/dL (ref >39.00)
NonHDL: 198.76
Total CHOL/HDL Ratio: 4
Triglycerides: 287 mg/dL — ABNORMAL HIGH (ref 0.0–149.0)
VLDL: 57.4 mg/dL — ABNORMAL HIGH (ref 0.0–40.0)

## 2019-05-16 LAB — BASIC METABOLIC PANEL
BUN: 21 mg/dL (ref 6–23)
CO2: 26 mEq/L (ref 19–32)
Calcium: 9.1 mg/dL (ref 8.4–10.5)
Chloride: 105 mEq/L (ref 96–112)
Creatinine, Ser: 0.74 mg/dL (ref 0.40–1.20)
GFR: 75.74 mL/min (ref >60.00)
Glucose, Bld: 97 mg/dL (ref 70–99)
Potassium: 4.7 mEq/L (ref 3.5–5.1)
Sodium: 139 mEq/L (ref 135–145)

## 2019-05-16 LAB — HEMOGLOBIN A1C: Hgb A1c MFr Bld: 5.5 % (ref 4.6–6.5)

## 2019-05-16 LAB — LDL CHOLESTEROL, DIRECT: Direct LDL: 149 mg/dL

## 2019-05-20 ENCOUNTER — Telehealth: Payer: Self-pay

## 2019-05-20 NOTE — Telephone Encounter (Signed)
Pt surical form and labs faxed to ortho

## 2019-05-27 NOTE — Progress Notes (Signed)
Lab stable but lipids ldl still could be better  let us know if you want to try  statin medication again  3 x per week instead of daily . ( dont remember if you tried  this lower dose)    Stay on the zetia .

## 2019-06-11 ENCOUNTER — Other Ambulatory Visit: Payer: Self-pay | Admitting: Internal Medicine

## 2019-06-17 DIAGNOSIS — M545 Low back pain: Secondary | ICD-10-CM | POA: Diagnosis not present

## 2019-06-27 DIAGNOSIS — Z1231 Encounter for screening mammogram for malignant neoplasm of breast: Secondary | ICD-10-CM | POA: Diagnosis not present

## 2019-06-27 LAB — HM MAMMOGRAPHY

## 2019-07-04 ENCOUNTER — Encounter: Payer: Self-pay | Admitting: Internal Medicine

## 2019-07-07 ENCOUNTER — Other Ambulatory Visit: Payer: Self-pay | Admitting: Internal Medicine

## 2019-07-15 ENCOUNTER — Inpatient Hospital Stay: Admit: 2019-07-15 | Payer: PPO | Admitting: Orthopedic Surgery

## 2019-07-15 ENCOUNTER — Encounter: Payer: Self-pay | Admitting: Emergency Medicine

## 2019-07-15 ENCOUNTER — Emergency Department: Payer: PPO

## 2019-07-15 ENCOUNTER — Emergency Department
Admission: EM | Admit: 2019-07-15 | Discharge: 2019-07-15 | Disposition: A | Discharge status: Home / Self Care | Payer: PPO | Attending: Emergency Medicine | Admitting: Emergency Medicine

## 2019-07-15 ENCOUNTER — Other Ambulatory Visit: Payer: Self-pay

## 2019-07-15 DIAGNOSIS — R109 Unspecified abdominal pain: Secondary | ICD-10-CM | POA: Insufficient documentation

## 2019-07-15 DIAGNOSIS — M546 Pain in thoracic spine: Secondary | ICD-10-CM | POA: Insufficient documentation

## 2019-07-15 DIAGNOSIS — I10 Essential (primary) hypertension: Secondary | ICD-10-CM | POA: Insufficient documentation

## 2019-07-15 DIAGNOSIS — M545 Low back pain, unspecified: Secondary | ICD-10-CM

## 2019-07-15 DIAGNOSIS — Z79899 Other long term (current) drug therapy: Secondary | ICD-10-CM | POA: Insufficient documentation

## 2019-07-15 DIAGNOSIS — K5730 Diverticulosis of large intestine without perforation or abscess without bleeding: Secondary | ICD-10-CM | POA: Diagnosis not present

## 2019-07-15 LAB — CBC
HCT: 40.1 % (ref 36.0–46.0)
Hemoglobin: 13.4 g/dL (ref 12.0–15.0)
MCH: 31.8 pg (ref 26.0–34.0)
MCHC: 33.4 g/dL (ref 30.0–36.0)
MCV: 95 fL (ref 80.0–100.0)
Platelets: 280 10*3/uL (ref 150–400)
RBC: 4.22 MIL/uL (ref 3.87–5.11)
RDW: 12.9 % (ref 11.5–15.5)
WBC: 5.5 10*3/uL (ref 4.0–10.5)
nRBC: 0 % (ref 0.0–0.2)

## 2019-07-15 LAB — COMPREHENSIVE METABOLIC PANEL
ALT: 21 U/L (ref 0–44)
AST: 19 U/L (ref 15–41)
Albumin: 4.1 g/dL (ref 3.5–5.0)
Alkaline Phosphatase: 52 U/L (ref 38–126)
Anion gap: 8 (ref 5–15)
BUN: 18 mg/dL (ref 8–23)
CO2: 24 mmol/L (ref 22–32)
Calcium: 9.2 mg/dL (ref 8.9–10.3)
Chloride: 106 mmol/L (ref 98–111)
Creatinine, Ser: 0.78 mg/dL (ref 0.44–1.00)
GFR calc Af Amer: >60 mL/min (ref >60)
GFR calc non Af Amer: >60 mL/min (ref >60)
Glucose, Bld: 110 mg/dL — ABNORMAL HIGH (ref 70–99)
Potassium: 4.2 mmol/L (ref 3.5–5.1)
Sodium: 138 mmol/L (ref 135–145)
Total Bilirubin: 0.5 mg/dL (ref 0.3–1.2)
Total Protein: 6.5 g/dL (ref 6.5–8.1)

## 2019-07-15 LAB — URINALYSIS, COMPLETE (UACMP) WITH MICROSCOPIC
Bacteria, UA: NONE SEEN
Bilirubin Urine: NEGATIVE
Glucose, UA: NEGATIVE mg/dL
Hgb urine dipstick: NEGATIVE
Ketones, ur: NEGATIVE mg/dL
Leukocytes,Ua: NEGATIVE
Nitrite: NEGATIVE
Protein, ur: NEGATIVE mg/dL
Specific Gravity, Urine: 1.014 (ref 1.005–1.030)
Squamous Epithelial / HPF: NONE SEEN (ref 0–5)
WBC, UA: NONE SEEN WBC/hpf (ref 0–5)
pH: 7 (ref 5.0–8.0)

## 2019-07-15 LAB — LIPASE, BLOOD: Lipase: 38 U/L (ref 11–51)

## 2019-07-15 SURGERY — ARTHROPLASTY, KNEE, TOTAL
Anesthesia: Choice | Site: Knee | Laterality: Right

## 2019-07-15 MED ORDER — ONDANSETRON 4 MG PO TBDP
4.0000 mg | ORAL_TABLET | Freq: Once | ORAL | Status: AC
  Administered 2019-07-15: 4 mg via ORAL
  Filled 2019-07-15: qty 1

## 2019-07-15 MED ORDER — HYDROCODONE-ACETAMINOPHEN 5-325 MG PO TABS
1.0000 | ORAL_TABLET | Freq: Once | ORAL | Status: AC
  Administered 2019-07-15: 1 via ORAL
  Filled 2019-07-15: qty 1

## 2019-07-15 MED ORDER — DICLOFENAC SODIUM 1 % EX GEL: 4.0000 g | Freq: Three times a day (TID) | CUTANEOUS | 0 refills | Status: AC | PRN

## 2019-07-15 MED ORDER — HYDROCODONE-ACETAMINOPHEN 5-325 MG PO TABS: 1.0000 | ORAL_TABLET | Freq: Four times a day (QID) | ORAL | 0 refills | Status: DC | PRN

## 2019-07-15 MED ORDER — SODIUM CHLORIDE 0.9% FLUSH: 3.0000 mL | Freq: Once | INTRAVENOUS | Status: DC

## 2019-07-15 MED ORDER — IBUPROFEN 400 MG PO TABS
400.0000 mg | ORAL_TABLET | Freq: Once | ORAL | Status: AC
  Administered 2019-07-15: 19:00:00 400 mg via ORAL
  Filled 2019-07-15 (×2): qty 1

## 2019-07-15 NOTE — ED Notes (Signed)
Resumed care from [place].  Pt sitting in chair talking on cell phone.  No acute distress.

## 2019-07-15 NOTE — Discharge Instructions (Signed)
As we discussed, your symptoms are likely due to back pain related to your thoracic and lumbar spine.  For now, will continue the Mobic 7.5 mg that was prescribed by orthopedist.  In addition to this, I will prescribe a stronger pain medication that you can take short-term.  Take this with food, and do not drive while taking it.  I have also prescribed antinausea medication if the medication makes you nauseous.  You can try heating pads, as well as the topical Voltaren gel to help with the pain as well.  No heavy lifting or twisting.

## 2019-07-15 NOTE — ED Notes (Addendum)
na
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
## 2019-07-15 NOTE — ED Notes (Signed)
Report given to Amy, RN.

## 2019-07-15 NOTE — ED Triage Notes (Signed)
C/O severe pain to left lower back that radiates to LLQ x 1 day. Denies dysuria.  Last BM 07/15/2019.  AAOx3.  Skin warm and dry.  NAD

## 2019-07-18 ENCOUNTER — Encounter: Payer: Self-pay | Admitting: Emergency Medicine

## 2019-07-18 ENCOUNTER — Other Ambulatory Visit: Payer: Self-pay

## 2019-07-18 ENCOUNTER — Emergency Department: Payer: PPO

## 2019-07-18 ENCOUNTER — Emergency Department
Admission: EM | Admit: 2019-07-18 | Discharge: 2019-07-18 | Disposition: A | Discharge status: Home / Self Care | Payer: PPO | Attending: Emergency Medicine | Admitting: Emergency Medicine

## 2019-07-18 DIAGNOSIS — K5900 Constipation, unspecified: Secondary | ICD-10-CM | POA: Insufficient documentation

## 2019-07-18 DIAGNOSIS — I700 Atherosclerosis of aorta: Secondary | ICD-10-CM | POA: Diagnosis not present

## 2019-07-18 DIAGNOSIS — I10 Essential (primary) hypertension: Secondary | ICD-10-CM | POA: Diagnosis not present

## 2019-07-18 DIAGNOSIS — Z79899 Other long term (current) drug therapy: Secondary | ICD-10-CM | POA: Diagnosis not present

## 2019-07-18 DIAGNOSIS — R109 Unspecified abdominal pain: Secondary | ICD-10-CM | POA: Diagnosis not present

## 2019-07-18 DIAGNOSIS — R1032 Left lower quadrant pain: Secondary | ICD-10-CM | POA: Diagnosis not present

## 2019-07-18 LAB — URINALYSIS, COMPLETE (UACMP) WITH MICROSCOPIC
Bacteria, UA: NONE SEEN
Bilirubin Urine: NEGATIVE
Glucose, UA: 50 mg/dL — AB
Hgb urine dipstick: NEGATIVE
Ketones, ur: NEGATIVE mg/dL
Leukocytes,Ua: NEGATIVE
Nitrite: NEGATIVE
Protein, ur: NEGATIVE mg/dL
Specific Gravity, Urine: 1.017 (ref 1.005–1.030)
pH: 6 (ref 5.0–8.0)

## 2019-07-18 LAB — COMPREHENSIVE METABOLIC PANEL
ALT: 18 U/L (ref 0–44)
AST: 19 U/L (ref 15–41)
Albumin: 4.2 g/dL (ref 3.5–5.0)
Alkaline Phosphatase: 47 U/L (ref 38–126)
Anion gap: 8 (ref 5–15)
BUN: 16 mg/dL (ref 8–23)
CO2: 27 mmol/L (ref 22–32)
Calcium: 8.4 mg/dL — ABNORMAL LOW (ref 8.9–10.3)
Chloride: 102 mmol/L (ref 98–111)
Creatinine, Ser: 0.6 mg/dL (ref 0.44–1.00)
GFR calc Af Amer: >60 mL/min (ref >60)
GFR calc non Af Amer: >60 mL/min (ref >60)
Glucose, Bld: 132 mg/dL — ABNORMAL HIGH (ref 70–99)
Potassium: 4.4 mmol/L (ref 3.5–5.1)
Sodium: 137 mmol/L (ref 135–145)
Total Bilirubin: 0.5 mg/dL (ref 0.3–1.2)
Total Protein: 7 g/dL (ref 6.5–8.1)

## 2019-07-18 LAB — CBC
HCT: 42.5 % (ref 36.0–46.0)
Hemoglobin: 13.8 g/dL (ref 12.0–15.0)
MCH: 31.1 pg (ref 26.0–34.0)
MCHC: 32.5 g/dL (ref 30.0–36.0)
MCV: 95.7 fL (ref 80.0–100.0)
Platelets: 247 10*3/uL (ref 150–400)
RBC: 4.44 MIL/uL (ref 3.87–5.11)
RDW: 12.7 % (ref 11.5–15.5)
WBC: 7.1 10*3/uL (ref 4.0–10.5)
nRBC: 0 % (ref 0.0–0.2)

## 2019-07-18 LAB — LACTIC ACID, PLASMA: Lactic Acid, Venous: 0.9 mmol/L (ref 0.5–1.9)

## 2019-07-18 LAB — LIPASE, BLOOD: Lipase: 24 U/L (ref 11–51)

## 2019-07-18 MED ORDER — POLYETHYLENE GLYCOL 3350 17 GM/SCOOP PO POWD: ORAL | 0 refills | Status: DC

## 2019-07-18 MED ORDER — MORPHINE SULFATE (PF) 4 MG/ML IV SOLN
4.0000 mg | Freq: Once | INTRAVENOUS | Status: AC
  Administered 2019-07-18: 13:00:00 4 mg via INTRAVENOUS
  Filled 2019-07-18: qty 1

## 2019-07-18 MED ORDER — ONDANSETRON HCL 4 MG/2ML IJ SOLN
4.0000 mg | Freq: Once | INTRAMUSCULAR | Status: AC
  Administered 2019-07-18: 4 mg via INTRAVENOUS
  Filled 2019-07-18: qty 2

## 2019-07-18 MED ORDER — SENNOSIDES-DOCUSATE SODIUM 8.6-50 MG PO TABS: 2.0000 | ORAL_TABLET | Freq: Two times a day (BID) | ORAL | 0 refills | Status: DC

## 2019-07-18 MED ORDER — SODIUM CHLORIDE 0.9 % IV BOLUS
500.0000 mL | Freq: Once | INTRAVENOUS | Status: AC
  Administered 2019-07-18: 500 mL via INTRAVENOUS

## 2019-07-18 MED ORDER — IOHEXOL 350 MG/ML SOLN
75.0000 mL | Freq: Once | INTRAVENOUS | Status: AC | PRN
  Administered 2019-07-18: 75 mL via INTRAVENOUS

## 2019-07-18 NOTE — ED Triage Notes (Signed)
Patient to ER for c/o left sided abd pain with pain radiating into left back/flank. Patient was seen here on 2/8 for similar symptoms, but states pain has moved in to left suprapubic area in comparison to pain on 2/8.

## 2019-07-18 NOTE — ED Notes (Signed)
Pt has gone and returned from CT
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
## 2019-07-18 NOTE — Discharge Instructions (Addendum)
Your lab tests and CT angiogram scan today were all okay.  We are unable to find a precise cause for your pain, but your evaluation is reassuring.  Please follow up with your doctor within 1 week for continued monitoring of your symptoms.

## 2019-07-18 NOTE — ED Notes (Signed)
Provided pt w/ crackers and peanut butter w/ decaf per request.

## 2019-07-19 ENCOUNTER — Encounter: Payer: Self-pay | Admitting: Internal Medicine

## 2019-07-19 ENCOUNTER — Telehealth (INDEPENDENT_AMBULATORY_CARE_PROVIDER_SITE_OTHER): Payer: PPO | Admitting: Internal Medicine

## 2019-07-19 DIAGNOSIS — M546 Pain in thoracic spine: Secondary | ICD-10-CM

## 2019-07-19 DIAGNOSIS — R109 Unspecified abdominal pain: Secondary | ICD-10-CM | POA: Diagnosis not present

## 2019-07-19 NOTE — Progress Notes (Signed)
Virtual Visit via Telephone Note  I connected with@ on 07/19/19 at  1:30 PM EST by telephone and verified that I am speaking with the correct person using two identifiers.   I discussed the limitations, risks, security and privacy concerns of performing an evaluation and management service by telephone and the limited availability of in person appointments. tThere may be a patient responsible charge related to this service. The patient expressed understanding and agreed to proceed.  Location patient: home Location provider: work  office Participants present for the call: patient, provider Patient did not have a visit in the prior 7 days to address this/these issue(s).   History of Present Illness: Julie Bonilla  Had visit to ed   For left fank abd pain   Ct and labs done   Went to ed x 2  After trip in care  No injury  Pain in soft part of back on left and radiated around   No fever utis sx rash   But wwa"excriciating)   Had 2 visit s and now getting better    No ob constipation but  There was stool in imagined area  To have tka feb 22  Rehab sis to help her.        Patient presents with left-sided abdominal pain radiating to the back.  Vital signs unremarkable except for moderately elevated blood pressure.  Exam is overall benign, but with pain out of proportion to exam and age and previously seen atherosclerotic disease on noncontrast CT, there is concern for vascular occlusion based on the current presentation.  Today's labs are unremarkable.  I will check a lactic acid level and get a CT angiogram of the abdomen and pelvis.  Initial pain and nausea control with 4 mg IV morphine and Zofran 4 mg IV.  IV fluids for hydration.     Clinical Course as of Jul 17 1552  Thu Jul 18, 2019  1513 CT unremarkable except for some evidence of constipation.  Patient wants to eat, will p.o. trial of plan for discharge.   [PS]    Observations/Objective: Patient sounds personable and well on  the phone. I do not appreciate any SOB. Speech and thought processing are grossly intact. Patient reported vitals: Lab Results  Component Value Date   WBC 7.1 07/18/2019   HGB 13.8 07/18/2019   HCT 42.5 07/18/2019   PLT 247 07/18/2019   GLUCOSE 132 (H) 07/18/2019   CHOL 262 (H) 05/15/2019   TRIG 287.0 (H) 05/15/2019   HDL 63.60 05/15/2019   LDLDIRECT 149.0 05/15/2019   LDLCALC 152 (H) 11/01/2017   ALT 18 07/18/2019   AST 19 07/18/2019   NA 137 07/18/2019   K 4.4 07/18/2019   CL 102 07/18/2019   CREATININE 0.60 07/18/2019   BUN 16 07/18/2019   CO2 27 07/18/2019   TSH 1.95 11/01/2017   HGBA1C 5.5 05/15/2019   Record revewie    Assessment and Plan: Acute left-sided thoracic back pain - see ed eval x 2 similar to renal stone but  imaging x 2 neg for such  Left flank pain - radiated and has had 2 ed visitss  now getting better  data reviewed   upcoming knee surgery   . Activity as tolearted is getting better  Left bronchiectases  Stable by sx and no signs of infection at this time  follow    Follow Up Instructions:   Expectant management.  [id number] [id number] [id number] 11-20 [id number] 21-30 I did not refer  this patient for an OV in the next 24 hours for this/these issue(s).  I discussed the assessment and treatment plan with the patient. The patient was provided an opportunity to ask questions and answered. The patient agreed with the plan and demonstrated an understanding of the instructions.   The patient was advised to call back or seek an in-person evaluation if the symptoms worsen or if the condition fails to improve as anticipated.  I provided 19 minutes of non-face-to-face time during this encounter. Return if symptoms worsen or fail to improve, for or when planned or due .  Shanon Ace, MD

## 2019-07-22 ENCOUNTER — Other Ambulatory Visit: Payer: Self-pay | Admitting: Internal Medicine
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
## 2019-07-23 NOTE — Patient Instructions (Addendum)
DUE TO COVID-19 ONLY ONE VISITOR IS ALLOWED TO COME WITH YOU AND STAY IN THE WAITING ROOM ONLY DURING PRE OP AND PROCEDURE DAY OF SURGERY. THE 1 VISITOR MAY VISIT WITH YOU AFTER SURGERY IN YOUR PRIVATE ROOM DURING VISITING HOURS ONLY!  YOU NEED TO HAVE A COVID 19 TEST ON: 07/26/19 @  2:20 pm       , THIS TEST MUST BE DONE BEFORE SURGERY, COME  Julie Bonilla, Julie Bonilla , [id number].  ([place]) ONCE YOUR COVID TEST IS COMPLETED, PLEASE BEGIN THE QUARANTINE INSTRUCTIONS AS OUTLINED IN YOUR HANDOUT.                Julie Bonilla    Your procedure is scheduled on: 07/29/19   Report to [name] Main  Entrance   Report to admitting at: 5:50 AM     Call this number if you have problems the morning of surgery [phone]    Remember:    [name], NO [place].     Take these medicines the morning of surgery with A SIP OF WATER: cetirizine,duloxetine,ezetimibe,gabapentin               You may not have any metal on your body including hair pins and              piercings  Do not wear jewelry, make-up, lotions, powders or perfumes, deodorant             Do not wear nail polish on your fingernails.  Do not shave  48 hours prior to surgery.                 Do not bring valuables to the hospital. [name].  Contacts, dentures or bridgework may not be worn into surgery.  Leave suitcase in the car. After surgery it may be brought to your room.     Patients discharged the day of surgery will not be allowed to drive home. IF YOU ARE HAVING SURGERY AND GOING HOME THE SAME DAY, YOU MUST HAVE AN ADULT TO DRIVE YOU HOME AND BE WITH YOU FOR 24 HOURS. YOU MAY GO HOME BY TAXI OR UBER OR ORTHERWISE, BUT AN ADULT MUST ACCOMPANY YOU HOME AND STAY WITH YOU FOR 24 HOURS.  Name and phone number of your driver:  Special Instructions: N/A              Please  read over the following fact sheets you were given: _____________________________________________________________________             NO SOLID FOOD AFTER MIDNIGHT THE NIGHT PRIOR TO SURGERY. NOTHING BY MOUTH EXCEPT CLEAR LIQUIDS UNTIL: 5:20 am . PLEASE FINISH ENSURE DRINK PER SURGEON ORDER  WHICH NEEDS TO BE COMPLETED AT: 5:20 am .   CLEAR LIQUID DIET   Foods Allowed                                                                     Foods Excluded  Coffee and tea, regular and decaf  liquids that you cannot  Plain Jell-O any favor except red or purple                                           see through such as: Fruit ices (not with fruit pulp)                                     milk, soups, orange juice  Iced Popsicles                                    All solid food Carbonated beverages, regular and diet                                    Cranberry, grape and apple juices Sports drinks like Gatorade Lightly seasoned clear broth or consume(fat free) Sugar, honey syrup  Sample Menu Breakfast                                Lunch                                     Supper Cranberry juice                    Beef broth                            Chicken broth Jell-O                                     Grape juice                           Apple juice Coffee or tea                        Jell-O                                      Popsicle                                                Coffee or tea                        Coffee or tea  _____________________________________________________________________  [facility] - Preparing for Surgery Before surgery, you can play an important role.  Because skin is not sterile, your skin needs to be as free of germs as possible.  You can reduce the number of germs on your skin by washing with CHG (chlorahexidine gluconate) soap before surgery.  CHG is an antiseptic cleaner which kills  germs and bonds with the skin to  continue killing germs even after washing. Please DO NOT use if you have an allergy to CHG or antibacterial soaps.  If your skin becomes reddened/irritated stop using the CHG and inform your nurse when you arrive at Short Stay. Do not shave (including legs and underarms) for at least 48 hours prior to the first CHG shower.  You may shave your face/neck. Please follow these instructions carefully:  1.  Shower with CHG Soap the night before surgery and the  morning of Surgery.  2.  If you choose to wash your hair, wash your hair first as usual with your  normal  shampoo.  3.  After you shampoo, rinse your hair and body thoroughly to remove the  shampoo.                           4.  Use CHG as you would any other liquid soap.  You can apply chg directly  to the skin and wash                       Gently with a scrungie or clean washcloth.  5.  Apply the CHG Soap to your body ONLY FROM THE NECK DOWN.   Do not use on face/ open                           Wound or open sores. Avoid contact with eyes, ears mouth and genitals (private parts).                       Wash face,  Genitals (private parts) with your normal soap.             6.  Wash thoroughly, paying special attention to the area where your surgery  will be performed.  7.  Thoroughly rinse your body with warm water from the neck down.  8.  DO NOT shower/wash with your normal soap after using and rinsing off  the CHG Soap.                9.  Pat yourself dry with a clean towel.            10.  Wear clean pajamas.            11.  Place clean sheets on your bed the night of your first shower and do not  sleep with pets. Day of Surgery : Do not apply any lotions/deodorants the morning of surgery.  Please wear clean clothes to the hospital/surgery center.  FAILURE TO FOLLOW THESE INSTRUCTIONS MAY RESULT IN THE CANCELLATION OF YOUR SURGERY PATIENT SIGNATURE_________________________________  NURSE  SIGNATURE__________________________________  ________________________________________________________________________    Julie Bonilla  An incentive spirometer is a tool that can help keep your lungs clear and active. This tool measures how well you are filling your lungs with each breath. Taking long deep breaths may help reverse or decrease the chance of developing breathing (pulmonary) problems (especially infection) following:  A long period of time when you are unable to move or be active. BEFORE THE PROCEDURE   If the spirometer includes an indicator to show your best effort, your nurse or respiratory therapist will set it to a desired goal.  If possible, sit up straight or lean slightly forward. Try not to slouch.  Hold the incentive spirometer in  an upright position. INSTRUCTIONS FOR USE  1. Sit on the edge of your bed if possible, or sit up as far as you can in bed or on a chair. 2. Hold the incentive spirometer in an upright position. 3. Breathe out normally. 4. Place the mouthpiece in your mouth and seal your lips tightly around it. 5. Breathe in slowly and as deeply as possible, raising the piston or the ball toward the top of the column. 6. Hold your breath for 3-5 seconds or for as long as possible. Allow the piston or ball to fall to the bottom of the column. 7. Remove the mouthpiece from your mouth and breathe out normally. 8. Rest for a few seconds and repeat Steps 1 through 7 at least 10 times every 1-2 hours when you are awake. Take your time and take a few normal breaths between deep breaths. 9. The spirometer may include an indicator to show your best effort. Use the indicator as a goal to work toward during each repetition. 10. After each set of 10 deep breaths, practice coughing to be sure your lungs are clear. If you have an incision (the cut made at the time of surgery), support your incision when coughing by placing a pillow or rolled up towels firmly  against it. Once you are able to get out of bed, walk around indoors and cough well. You may stop using the incentive spirometer when instructed by your caregiver.  RISKS AND COMPLICATIONS  Take your time so you do not get dizzy or light-headed.  If you are in pain, you may need to take or ask for pain medication before doing incentive spirometry. It is harder to take a deep breath if you are having pain. AFTER USE  Rest and breathe slowly and easily.  It can be helpful to keep track of a log of your progress. Your caregiver can provide you with a simple table to help with this. If you are using the spirometer at home, follow these instructions: [place] IF:   You are having difficultly using the spirometer.  You have trouble using the spirometer as often as instructed.  Your pain medication is not giving enough relief while using the spirometer.  You develop fever of 100.5 F (38.1 C) or higher. SEEK IMMEDIATE MEDICAL CARE IF:   You cough up bloody sputum that had not been present before.  You develop fever of 102 F (38.9 C) or greater.  You develop worsening pain at or near the incision site. MAKE SURE YOU:   Understand these instructions.  Will watch your condition.  Will get help right away if you are not doing well or get worse. Document Released: 10/03/2006 Document Revised: 08/15/2011 Document Reviewed: 12/04/2006 [facility] Patient Information 2014 [place], [place].   ________________________________________________________________________

## 2019-07-24 ENCOUNTER — Encounter (HOSPITAL_COMMUNITY): Payer: Self-pay

## 2019-07-24 ENCOUNTER — Encounter (HOSPITAL_COMMUNITY)
Admission: RE | Admit: 2019-07-24 | Discharge: 2019-07-24 | Disposition: A | Discharge status: Home / Self Care | Payer: PPO | Source: Ambulatory Visit | Attending: Orthopedic Surgery | Admitting: Orthopedic Surgery

## 2019-07-24 ENCOUNTER — Other Ambulatory Visit: Payer: Self-pay

## 2019-07-24 DIAGNOSIS — Z01812 Encounter for preprocedural laboratory examination: Secondary | ICD-10-CM | POA: Diagnosis not present

## 2019-07-24 LAB — COMPREHENSIVE METABOLIC PANEL
ALT: 20 U/L (ref 0–44)
AST: 20 U/L (ref 15–41)
Albumin: 4.2 g/dL (ref 3.5–5.0)
Alkaline Phosphatase: 42 U/L (ref 38–126)
Anion gap: 7 (ref 5–15)
BUN: 14 mg/dL (ref 8–23)
CO2: 26 mmol/L (ref 22–32)
Calcium: 8.8 mg/dL — ABNORMAL LOW (ref 8.9–10.3)
Chloride: 104 mmol/L (ref 98–111)
Creatinine, Ser: 0.72 mg/dL (ref 0.44–1.00)
GFR calc Af Amer: >60 mL/min (ref >60)
GFR calc non Af Amer: >60 mL/min (ref >60)
Glucose, Bld: 103 mg/dL — ABNORMAL HIGH (ref 70–99)
Potassium: 4.5 mmol/L (ref 3.5–5.1)
Sodium: 137 mmol/L (ref 135–145)
Total Bilirubin: 0.6 mg/dL (ref 0.3–1.2)
Total Protein: 6.8 g/dL (ref 6.5–8.1)

## 2019-07-24 LAB — CBC
HCT: 43.3 % (ref 36.0–46.0)
Hemoglobin: 13.8 g/dL (ref 12.0–15.0)
MCH: 31.4 pg (ref 26.0–34.0)
MCHC: 31.9 g/dL (ref 30.0–36.0)
MCV: 98.4 fL (ref 80.0–100.0)
Platelets: 268 10*3/uL (ref 150–400)
RBC: 4.4 MIL/uL (ref 3.87–5.11)
RDW: 13 % (ref 11.5–15.5)
WBC: 5.1 10*3/uL (ref 4.0–10.5)
nRBC: 0 % (ref 0.0–0.2)

## 2019-07-24 LAB — PROTIME-INR
INR: 1 (ref 0.8–1.2)
Prothrombin Time: 12.7 seconds (ref 11.4–15.2)

## 2019-07-24 LAB — SURGICAL PCR SCREEN
MRSA, PCR: NEGATIVE
Staphylococcus aureus: NEGATIVE

## 2019-07-24 LAB — ABO/RH: ABO/RH(D): O NEG

## 2019-07-24 LAB — APTT: aPTT: 39 seconds — ABNORMAL HIGH (ref 24–36)

## 2019-07-24 NOTE — Progress Notes (Signed)
PCP - Dr. Idell Pickles. LOV: 05/15/19. Clearance Cardiologist -   Chest x-ray -  EKG - 07/19/19. EPIC Stress Test -  ECHO -  Cardiac Cath -   Sleep Study -  CPAP -   Fasting Blood Sugar -  Checks Blood Sugar _____ times a day  Blood Thinner Instructions: Aspirin Instructions: Last Dose:  Anesthesia review:   Patient denies shortness of breath, fever, cough and chest pain at PAT appointment   Patient verbalized understanding of instructions that were given to them at the PAT appointment. Patient was also instructed that they will need to review over the PAT instructions again at home before surgery.

## 2019-07-26 ENCOUNTER — Other Ambulatory Visit (HOSPITAL_COMMUNITY)
Admission: RE | Admit: 2019-07-26 | Discharge: 2019-07-26 | Disposition: A | Discharge status: Home / Self Care | Payer: PPO | Source: Ambulatory Visit | Attending: Orthopedic Surgery | Admitting: Orthopedic Surgery

## 2019-07-26 DIAGNOSIS — K219 Gastro-esophageal reflux disease without esophagitis: Secondary | ICD-10-CM | POA: Diagnosis present

## 2019-07-26 DIAGNOSIS — I110 Hypertensive heart disease with heart failure: Secondary | ICD-10-CM | POA: Diagnosis present

## 2019-07-26 DIAGNOSIS — G2581 Restless legs syndrome: Secondary | ICD-10-CM | POA: Diagnosis present

## 2019-07-26 DIAGNOSIS — Z888 Allergy status to other drugs, medicaments and biological substances status: Secondary | ICD-10-CM | POA: Diagnosis not present

## 2019-07-26 DIAGNOSIS — G8918 Other acute postprocedural pain: Secondary | ICD-10-CM | POA: Diagnosis not present

## 2019-07-26 DIAGNOSIS — Z85828 Personal history of other malignant neoplasm of skin: Secondary | ICD-10-CM | POA: Diagnosis not present

## 2019-07-26 DIAGNOSIS — F329 Major depressive disorder, single episode, unspecified: Secondary | ICD-10-CM | POA: Diagnosis present

## 2019-07-26 DIAGNOSIS — Z79899 Other long term (current) drug therapy: Secondary | ICD-10-CM | POA: Diagnosis not present

## 2019-07-26 DIAGNOSIS — I509 Heart failure, unspecified: Secondary | ICD-10-CM | POA: Diagnosis present

## 2019-07-26 DIAGNOSIS — Z01812 Encounter for preprocedural laboratory examination: Secondary | ICD-10-CM | POA: Insufficient documentation

## 2019-07-26 DIAGNOSIS — Z91048 Other nonmedicinal substance allergy status: Secondary | ICD-10-CM | POA: Diagnosis not present

## 2019-07-26 DIAGNOSIS — M810 Age-related osteoporosis without current pathological fracture: Secondary | ICD-10-CM | POA: Diagnosis present

## 2019-07-26 DIAGNOSIS — Z96651 Presence of right artificial knee joint: Secondary | ICD-10-CM | POA: Diagnosis not present

## 2019-07-26 DIAGNOSIS — E785 Hyperlipidemia, unspecified: Secondary | ICD-10-CM | POA: Diagnosis present

## 2019-07-26 DIAGNOSIS — M25562 Pain in left knee: Secondary | ICD-10-CM | POA: Diagnosis present

## 2019-07-26 DIAGNOSIS — Z79891 Long term (current) use of opiate analgesic: Secondary | ICD-10-CM | POA: Diagnosis not present

## 2019-07-26 DIAGNOSIS — M1711 Unilateral primary osteoarthritis, right knee: Secondary | ICD-10-CM | POA: Diagnosis present

## 2019-07-26 DIAGNOSIS — R413 Other amnesia: Secondary | ICD-10-CM | POA: Diagnosis present

## 2019-07-26 DIAGNOSIS — I10 Essential (primary) hypertension: Secondary | ICD-10-CM | POA: Diagnosis not present

## 2019-07-26 DIAGNOSIS — H9190 Unspecified hearing loss, unspecified ear: Secondary | ICD-10-CM | POA: Diagnosis present

## 2019-07-26 DIAGNOSIS — Z86010 Personal history of colonic polyps: Secondary | ICD-10-CM | POA: Diagnosis not present

## 2019-07-26 DIAGNOSIS — F909 Attention-deficit hyperactivity disorder, unspecified type: Secondary | ICD-10-CM | POA: Diagnosis present

## 2019-07-26 DIAGNOSIS — Z8261 Family history of arthritis: Secondary | ICD-10-CM | POA: Diagnosis not present

## 2019-07-26 DIAGNOSIS — Z20822 Contact with and (suspected) exposure to covid-19: Secondary | ICD-10-CM | POA: Diagnosis present

## 2019-07-26 DIAGNOSIS — J329 Chronic sinusitis, unspecified: Secondary | ICD-10-CM | POA: Diagnosis present

## 2019-07-26 LAB — SARS CORONAVIRUS 2 (TAT 6-24 HRS): SARS Coronavirus 2: NEGATIVE

## 2019-07-29 ENCOUNTER — Inpatient Hospital Stay (HOSPITAL_COMMUNITY)
Admission: RE | Admit: 2019-07-29 | Discharge: 2019-07-30 | DRG: 470 | Disposition: A | Discharge status: Home / Self Care | Payer: PPO | Attending: Orthopedic Surgery | Admitting: Orthopedic Surgery

## 2019-07-29 ENCOUNTER — Other Ambulatory Visit: Payer: Self-pay

## 2019-07-29 ENCOUNTER — Inpatient Hospital Stay (HOSPITAL_COMMUNITY): Payer: PPO | Admitting: Anesthesiology

## 2019-07-29 ENCOUNTER — Encounter (HOSPITAL_COMMUNITY)
Admission: RE | Disposition: A | Discharge status: Home / Self Care | Payer: Self-pay | Source: Home / Self Care | Attending: Orthopedic Surgery

## 2019-07-29 ENCOUNTER — Inpatient Hospital Stay (HOSPITAL_COMMUNITY): Payer: PPO | Admitting: Physician Assistant

## 2019-07-29 ENCOUNTER — Encounter (HOSPITAL_COMMUNITY): Payer: Self-pay | Admitting: Orthopedic Surgery

## 2019-07-29 DIAGNOSIS — H9190 Unspecified hearing loss, unspecified ear: Secondary | ICD-10-CM | POA: Diagnosis present

## 2019-07-29 DIAGNOSIS — Z79899 Other long term (current) drug therapy: Secondary | ICD-10-CM | POA: Diagnosis not present

## 2019-07-29 DIAGNOSIS — G2581 Restless legs syndrome: Secondary | ICD-10-CM | POA: Diagnosis present

## 2019-07-29 DIAGNOSIS — K219 Gastro-esophageal reflux disease without esophagitis: Secondary | ICD-10-CM | POA: Diagnosis present

## 2019-07-29 DIAGNOSIS — Z888 Allergy status to other drugs, medicaments and biological substances status: Secondary | ICD-10-CM

## 2019-07-29 DIAGNOSIS — Z91048 Other nonmedicinal substance allergy status: Secondary | ICD-10-CM | POA: Diagnosis not present

## 2019-07-29 DIAGNOSIS — E785 Hyperlipidemia, unspecified: Secondary | ICD-10-CM | POA: Diagnosis present

## 2019-07-29 DIAGNOSIS — F909 Attention-deficit hyperactivity disorder, unspecified type: Secondary | ICD-10-CM | POA: Diagnosis present

## 2019-07-29 DIAGNOSIS — Z20822 Contact with and (suspected) exposure to covid-19: Secondary | ICD-10-CM | POA: Diagnosis present

## 2019-07-29 DIAGNOSIS — F329 Major depressive disorder, single episode, unspecified: Secondary | ICD-10-CM | POA: Diagnosis present

## 2019-07-29 DIAGNOSIS — M1711 Unilateral primary osteoarthritis, right knee: Secondary | ICD-10-CM | POA: Diagnosis present

## 2019-07-29 DIAGNOSIS — Z79891 Long term (current) use of opiate analgesic: Secondary | ICD-10-CM | POA: Diagnosis not present

## 2019-07-29 DIAGNOSIS — R413 Other amnesia: Secondary | ICD-10-CM | POA: Diagnosis present

## 2019-07-29 DIAGNOSIS — M179 Osteoarthritis of knee, unspecified: Secondary | ICD-10-CM | POA: Diagnosis present

## 2019-07-29 DIAGNOSIS — Z96651 Presence of right artificial knee joint: Secondary | ICD-10-CM

## 2019-07-29 DIAGNOSIS — M810 Age-related osteoporosis without current pathological fracture: Secondary | ICD-10-CM | POA: Diagnosis present

## 2019-07-29 DIAGNOSIS — Z85828 Personal history of other malignant neoplasm of skin: Secondary | ICD-10-CM | POA: Diagnosis not present

## 2019-07-29 DIAGNOSIS — I509 Heart failure, unspecified: Secondary | ICD-10-CM | POA: Diagnosis present

## 2019-07-29 DIAGNOSIS — J329 Chronic sinusitis, unspecified: Secondary | ICD-10-CM | POA: Diagnosis present

## 2019-07-29 DIAGNOSIS — I110 Hypertensive heart disease with heart failure: Secondary | ICD-10-CM | POA: Diagnosis present

## 2019-07-29 DIAGNOSIS — Z86010 Personal history of colonic polyps: Secondary | ICD-10-CM

## 2019-07-29 DIAGNOSIS — Z8261 Family history of arthritis: Secondary | ICD-10-CM

## 2019-07-29 DIAGNOSIS — M1710 Unilateral primary osteoarthritis, unspecified knee: Secondary | ICD-10-CM | POA: Diagnosis present

## 2019-07-29 DIAGNOSIS — M25562 Pain in left knee: Secondary | ICD-10-CM | POA: Diagnosis present

## 2019-07-29 HISTORY — PX: TOTAL KNEE ARTHROPLASTY: SHX125

## 2019-07-29 LAB — TYPE AND SCREEN
ABO/RH(D): O NEG
Antibody Screen: NEGATIVE

## 2019-07-29 SURGERY — ARTHROPLASTY, KNEE, TOTAL
Anesthesia: Monitor Anesthesia Care | Site: Knee | Laterality: Right

## 2019-07-29 MED ORDER — 0.9 % SODIUM CHLORIDE (POUR BTL) OPTIME
TOPICAL | Status: DC | PRN
  Administered 2019-07-29: 1000 mL

## 2019-07-29 MED ORDER — ASPIRIN EC 325 MG PO TBEC: 325.0000 mg | DELAYED_RELEASE_TABLET | Freq: Two times a day (BID) | ORAL | Status: DC

## 2019-07-29 MED ORDER — CHEWING GUM (ORBIT) SUGAR FREE
1.0000 | CHEWING_GUM | ORAL | Status: DC | PRN
  Filled 2019-07-29: qty 1

## 2019-07-29 MED ORDER — OXYCODONE HCL 5 MG/5ML PO SOLN: 5.0000 mg | Freq: Once | ORAL | Status: DC | PRN

## 2019-07-29 MED ORDER — TRANEXAMIC ACID-NACL 1000-0.7 MG/100ML-% IV SOLN
1000.0000 mg | Freq: Once | INTRAVENOUS | Status: AC
  Administered 2019-07-29: 1000 mg via INTRAVENOUS
  Filled 2019-07-29: qty 100

## 2019-07-29 MED ORDER — CLONAZEPAM 0.5 MG PO TABS
0.5000 mg | ORAL_TABLET | Freq: Every day | ORAL | Status: DC
  Administered 2019-07-29: 1 mg via ORAL
  Filled 2019-07-29: qty 2

## 2019-07-29 MED ORDER — ONDANSETRON HCL 4 MG PO TABS: 4.0000 mg | ORAL_TABLET | Freq: Four times a day (QID) | ORAL | Status: DC | PRN

## 2019-07-29 MED ORDER — ACETAMINOPHEN 160 MG/5ML PO SOLN: 1000.0000 mg | Freq: Once | ORAL | Status: DC | PRN

## 2019-07-29 MED ORDER — SODIUM CHLORIDE (PF) 0.9 % IJ SOLN
INTRAMUSCULAR | Status: AC
  Filled 2019-07-29: qty 10

## 2019-07-29 MED ORDER — CHLORHEXIDINE GLUCONATE 4 % EX LIQD: 60.0000 mL | Freq: Once | CUTANEOUS | Status: DC

## 2019-07-29 MED ORDER — METOCLOPRAMIDE HCL 5 MG PO TABS: 5.0000 mg | ORAL_TABLET | Freq: Three times a day (TID) | ORAL | Status: DC | PRN

## 2019-07-29 MED ORDER — MIDAZOLAM HCL 2 MG/2ML IJ SOLN
1.0000 mg | Freq: Once | INTRAMUSCULAR | Status: DC
  Filled 2019-07-29: qty 2

## 2019-07-29 MED ORDER — CEFAZOLIN SODIUM-DEXTROSE 2-4 GM/100ML-% IV SOLN
2.0000 g | INTRAVENOUS | Status: AC
  Administered 2019-07-29: 2 g via INTRAVENOUS
  Filled 2019-07-29: qty 100

## 2019-07-29 MED ORDER — BUPIVACAINE LIPOSOME 1.3 % IJ SUSP
20.0000 mL | INTRAMUSCULAR | Status: DC
  Filled 2019-07-29: qty 20

## 2019-07-29 MED ORDER — MEPIVACAINE HCL (PF) 2 % IJ SOLN
INTRAMUSCULAR | Status: DC | PRN
  Administered 2019-07-29: 2.5 mL via INTRATHECAL

## 2019-07-29 MED ORDER — LOSARTAN POTASSIUM 50 MG PO TABS
50.0000 mg | ORAL_TABLET | Freq: Every day | ORAL | Status: DC
  Administered 2019-07-30: 50 mg via ORAL
  Filled 2019-07-29: qty 1

## 2019-07-29 MED ORDER — RIVAROXABAN 10 MG PO TABS
10.0000 mg | ORAL_TABLET | Freq: Every day | ORAL | Status: DC
  Administered 2019-07-30: 10 mg via ORAL
  Filled 2019-07-29: qty 1

## 2019-07-29 MED ORDER — DEXAMETHASONE SODIUM PHOSPHATE 10 MG/ML IJ SOLN
8.0000 mg | Freq: Once | INTRAMUSCULAR | Status: AC
  Administered 2019-07-29: 8 mg via INTRAVENOUS

## 2019-07-29 MED ORDER — FLUTICASONE PROPIONATE 50 MCG/ACT NA SUSP
2.0000 | Freq: Every day | NASAL | Status: DC
  Administered 2019-07-30: 2 via NASAL
  Filled 2019-07-29: qty 16

## 2019-07-29 MED ORDER — POVIDONE-IODINE 10 % EX SWAB
2.0000 "application " | Freq: Once | CUTANEOUS | Status: AC
  Administered 2019-07-29: 2 via TOPICAL

## 2019-07-29 MED ORDER — HYDROMORPHONE HCL 1 MG/ML IJ SOLN: 0.5000 mg | INTRAMUSCULAR | Status: DC | PRN

## 2019-07-29 MED ORDER — METHOCARBAMOL 500 MG IVPB - SIMPLE MED
500.0000 mg | Freq: Four times a day (QID) | INTRAVENOUS | Status: DC | PRN
  Filled 2019-07-29: qty 50

## 2019-07-29 MED ORDER — METOCLOPRAMIDE HCL 5 MG/ML IJ SOLN: 5.0000 mg | Freq: Three times a day (TID) | INTRAMUSCULAR | Status: DC | PRN

## 2019-07-29 MED ORDER — ACETAMINOPHEN 500 MG PO TABS: 1000.0000 mg | ORAL_TABLET | Freq: Once | ORAL | Status: DC | PRN

## 2019-07-29 MED ORDER — EZETIMIBE 10 MG PO TABS
10.0000 mg | ORAL_TABLET | Freq: Every day | ORAL | Status: DC
  Administered 2019-07-30: 10 mg via ORAL
  Filled 2019-07-29: qty 1

## 2019-07-29 MED ORDER — ONDANSETRON HCL 4 MG/2ML IJ SOLN: 4.0000 mg | Freq: Four times a day (QID) | INTRAMUSCULAR | Status: DC | PRN

## 2019-07-29 MED ORDER — CEFAZOLIN SODIUM-DEXTROSE 2-4 GM/100ML-% IV SOLN
2.0000 g | Freq: Four times a day (QID) | INTRAVENOUS | Status: AC
  Administered 2019-07-29 (×2): 2 g via INTRAVENOUS
  Filled 2019-07-29 (×2): qty 100

## 2019-07-29 MED ORDER — PROPOFOL 1000 MG/100ML IV EMUL
INTRAVENOUS | Status: AC
  Filled 2019-07-29: qty 100

## 2019-07-29 MED ORDER — GABAPENTIN 300 MG PO CAPS
300.0000 mg | ORAL_CAPSULE | Freq: Three times a day (TID) | ORAL | Status: DC
  Administered 2019-07-29 – 2019-07-30 (×3): 300 mg via ORAL
  Filled 2019-07-29 (×3): qty 1

## 2019-07-29 MED ORDER — LORATADINE 10 MG PO TABS
10.0000 mg | ORAL_TABLET | Freq: Every day | ORAL | Status: DC
  Administered 2019-07-30: 10 mg via ORAL
  Filled 2019-07-29: qty 1

## 2019-07-29 MED ORDER — SODIUM CHLORIDE 0.9 % IR SOLN
Status: DC | PRN
  Administered 2019-07-29: 1000 mL

## 2019-07-29 MED ORDER — OXYCODONE HCL 5 MG PO TABS
5.0000 mg | ORAL_TABLET | ORAL | Status: DC | PRN
  Administered 2019-07-29: 5 mg via ORAL
  Administered 2019-07-29 (×2): 10 mg via ORAL
  Administered 2019-07-29: 5 mg via ORAL
  Administered 2019-07-30 (×3): 10 mg via ORAL
  Filled 2019-07-29 (×4): qty 2
  Filled 2019-07-29 (×2): qty 1
  Filled 2019-07-29: qty 2

## 2019-07-29 MED ORDER — POLYETHYLENE GLYCOL 3350 17 G PO PACK: 17.0000 g | PACK | Freq: Every day | ORAL | Status: DC | PRN

## 2019-07-29 MED ORDER — ROPIVACAINE HCL 7.5 MG/ML IJ SOLN
INTRAMUSCULAR | Status: DC | PRN
  Administered 2019-07-29: 20 mL via PERINEURAL

## 2019-07-29 MED ORDER — DOCUSATE SODIUM 100 MG PO CAPS
100.0000 mg | ORAL_CAPSULE | Freq: Two times a day (BID) | ORAL | Status: DC
  Administered 2019-07-29 – 2019-07-30 (×2): 100 mg via ORAL
  Filled 2019-07-29 (×2): qty 1

## 2019-07-29 MED ORDER — DULOXETINE HCL 60 MG PO CPEP
60.0000 mg | ORAL_CAPSULE | Freq: Two times a day (BID) | ORAL | Status: DC
  Administered 2019-07-29 – 2019-07-30 (×2): 60 mg via ORAL
  Filled 2019-07-29 (×3): qty 1

## 2019-07-29 MED ORDER — PHENYLEPHRINE HCL-NACL 10-0.9 MG/250ML-% IV SOLN
INTRAVENOUS | Status: DC | PRN
  Administered 2019-07-29: 20 ug/min via INTRAVENOUS

## 2019-07-29 MED ORDER — BUPIVACAINE LIPOSOME 1.3 % IJ SUSP
INTRAMUSCULAR | Status: DC | PRN
  Administered 2019-07-29: 20 mL

## 2019-07-29 MED ORDER — SODIUM CHLORIDE 0.9 % IV SOLN: INTRAVENOUS | Status: DC

## 2019-07-29 MED ORDER — MONTELUKAST SODIUM 10 MG PO TABS
10.0000 mg | ORAL_TABLET | Freq: Every day | ORAL | Status: DC
  Administered 2019-07-29: 22:00:00 10 mg via ORAL
  Filled 2019-07-29: qty 1

## 2019-07-29 MED ORDER — OXYCODONE HCL 5 MG PO TABS: 5.0000 mg | ORAL_TABLET | Freq: Once | ORAL | Status: DC | PRN

## 2019-07-29 MED ORDER — METHOCARBAMOL 500 MG PO TABS
500.0000 mg | ORAL_TABLET | Freq: Four times a day (QID) | ORAL | Status: DC | PRN
  Administered 2019-07-30: 500 mg via ORAL
  Filled 2019-07-29: qty 1

## 2019-07-29 MED ORDER — FENTANYL CITRATE (PF) 100 MCG/2ML IJ SOLN: 25.0000 ug | INTRAMUSCULAR | Status: DC | PRN

## 2019-07-29 MED ORDER — SODIUM CHLORIDE (PF) 0.9 % IJ SOLN
INTRAMUSCULAR | Status: DC | PRN
  Administered 2019-07-29: 60 mL

## 2019-07-29 MED ORDER — STERILE WATER FOR IRRIGATION IR SOLN
Status: DC | PRN
  Administered 2019-07-29: 2000 mL

## 2019-07-29 MED ORDER — PHENYLEPHRINE HCL (PRESSORS) 10 MG/ML IV SOLN
INTRAVENOUS | Status: AC
  Filled 2019-07-29: qty 1

## 2019-07-29 MED ORDER — BISACODYL 10 MG RE SUPP: 10.0000 mg | Freq: Every day | RECTAL | Status: DC | PRN

## 2019-07-29 MED ORDER — ONDANSETRON HCL 4 MG/2ML IJ SOLN
INTRAMUSCULAR | Status: DC | PRN
  Administered 2019-07-29: 4 mg via INTRAVENOUS

## 2019-07-29 MED ORDER — SODIUM CHLORIDE (PF) 0.9 % IJ SOLN
INTRAMUSCULAR | Status: AC
  Filled 2019-07-29: qty 50

## 2019-07-29 MED ORDER — ONDANSETRON HCL 4 MG/2ML IJ SOLN
INTRAMUSCULAR | Status: AC
  Filled 2019-07-29: qty 2

## 2019-07-29 MED ORDER — ACETAMINOPHEN 10 MG/ML IV SOLN
1000.0000 mg | Freq: Four times a day (QID) | INTRAVENOUS | Status: DC
  Administered 2019-07-29: 1000 mg via INTRAVENOUS
  Filled 2019-07-29: qty 100

## 2019-07-29 MED ORDER — FENTANYL CITRATE (PF) 100 MCG/2ML IJ SOLN
50.0000 ug | Freq: Once | INTRAMUSCULAR | Status: AC
  Administered 2019-07-29: 07:00:00 50 ug via INTRAVENOUS
  Filled 2019-07-29: qty 2

## 2019-07-29 MED ORDER — DIPHENHYDRAMINE HCL 12.5 MG/5ML PO ELIX: 12.5000 mg | ORAL_SOLUTION | ORAL | Status: DC | PRN

## 2019-07-29 MED ORDER — ACETAMINOPHEN 10 MG/ML IV SOLN: 1000.0000 mg | Freq: Once | INTRAVENOUS | Status: DC | PRN

## 2019-07-29 MED ORDER — TRANEXAMIC ACID-NACL 1000-0.7 MG/100ML-% IV SOLN
1000.0000 mg | INTRAVENOUS | Status: AC
  Administered 2019-07-29: 1000 mg via INTRAVENOUS
  Filled 2019-07-29: qty 100

## 2019-07-29 MED ORDER — ACETAMINOPHEN 325 MG PO TABS
325.0000 mg | ORAL_TABLET | Freq: Four times a day (QID) | ORAL | Status: DC | PRN
  Administered 2019-07-30: 650 mg via ORAL
  Filled 2019-07-29: qty 2

## 2019-07-29 MED ORDER — DEXAMETHASONE SODIUM PHOSPHATE 10 MG/ML IJ SOLN
INTRAMUSCULAR | Status: AC
  Filled 2019-07-29: qty 1

## 2019-07-29 MED ORDER — FLEET ENEMA 7-19 GM/118ML RE ENEM: 1.0000 | ENEMA | Freq: Once | RECTAL | Status: DC | PRN

## 2019-07-29 MED ORDER — LIDOCAINE 2% (20 MG/ML) 5 ML SYRINGE
INTRAMUSCULAR | Status: DC | PRN
  Administered 2019-07-29: 40 mg via INTRAVENOUS

## 2019-07-29 MED ORDER — MENTHOL 3 MG MT LOZG: 1.0000 | LOZENGE | OROMUCOSAL | Status: DC | PRN

## 2019-07-29 MED ORDER — PROPOFOL 500 MG/50ML IV EMUL
INTRAVENOUS | Status: DC | PRN
  Administered 2019-07-29: 50 ug/kg/min via INTRAVENOUS

## 2019-07-29 MED ORDER — PHENOL 1.4 % MT LIQD
1.0000 | OROMUCOSAL | Status: DC | PRN
  Filled 2019-07-29: qty 177

## 2019-07-29 MED ORDER — LACTATED RINGERS IV SOLN: INTRAVENOUS | Status: DC

## 2019-07-29 MED ORDER — LIDOCAINE 2% (20 MG/ML) 5 ML SYRINGE
INTRAMUSCULAR | Status: AC
  Filled 2019-07-29: qty 5

## 2019-07-29 SURGICAL SUPPLY — 63 items
BAG SPEC THK2 15X12 ZIP CLS (MISCELLANEOUS) ×1
BAG ZIPLOCK 12X15 (MISCELLANEOUS) ×3 IMPLANT
BLADE SAG 18X100X1.27 (BLADE) ×3 IMPLANT
BLADE SAW SGTL 11.0X1.19X90.0M (BLADE) ×3 IMPLANT
BLADE SURG SZ10 CARB STEEL (BLADE) ×6 IMPLANT
BNDG ELASTIC 6X5.8 VLCR STR LF (GAUZE/BANDAGES/DRESSINGS) ×3 IMPLANT
BOWL SMART MIX CTS (DISPOSABLE) ×3 IMPLANT
CEMENT HV SMART SET (Cement) ×6 IMPLANT
CEMENT TIBIA MBT SIZE 2.5 (Knees) ×1 IMPLANT
CLOSURE WOUND 1/2 X4 (GAUZE/BANDAGES/DRESSINGS) ×2
COVER SURGICAL LIGHT HANDLE (MISCELLANEOUS) ×3 IMPLANT
COVER WAND RF STERILE (DRAPES) IMPLANT
CUFF TOURN SGL QUICK 34 (TOURNIQUET CUFF) ×2
CUFF TRNQT CYL 34X4.125X (TOURNIQUET CUFF) ×1 IMPLANT
DECANTER SPIKE VIAL GLASS SM (MISCELLANEOUS) ×3 IMPLANT
DRAPE U-SHAPE 47X51 STRL (DRAPES) ×3 IMPLANT
DRSG ADAPTIC 3X8 NADH LF (GAUZE/BANDAGES/DRESSINGS) ×3 IMPLANT
DRSG AQUACEL AG ADV 3.5X 6 (GAUZE/BANDAGES/DRESSINGS) IMPLANT
DRSG AQUACEL AG ADV 3.5X10 (GAUZE/BANDAGES/DRESSINGS) ×3 IMPLANT
DRSG PAD ABDOMINAL 8X10 ST (GAUZE/BANDAGES/DRESSINGS) ×3 IMPLANT
DURAPREP 26ML APPLICATOR (WOUND CARE) ×3 IMPLANT
ELECT REM PT RETURN 15FT ADLT (MISCELLANEOUS) ×3 IMPLANT
EVACUATOR 1/8 PVC DRAIN (DRAIN) ×3 IMPLANT
FEMUR SIGMA PS SZ 3.0 R (Femur) ×3 IMPLANT
GAUZE SPONGE 2X2 8PLY STRL LF (GAUZE/BANDAGES/DRESSINGS) ×1 IMPLANT
GAUZE SPONGE 4X4 12PLY STRL (GAUZE/BANDAGES/DRESSINGS) ×3 IMPLANT
GLOVE BIO SURGEON STRL SZ7 (GLOVE) ×3 IMPLANT
GLOVE BIO SURGEON STRL SZ8 (GLOVE) ×3 IMPLANT
GLOVE BIOGEL PI IND STRL 6.5 (GLOVE) ×1 IMPLANT
GLOVE BIOGEL PI IND STRL 7.0 (GLOVE) ×1 IMPLANT
GLOVE BIOGEL PI IND STRL 8 (GLOVE) ×1 IMPLANT
GLOVE BIOGEL PI INDICATOR 6.5 (GLOVE) ×2
GLOVE BIOGEL PI INDICATOR 7.0 (GLOVE) ×2
GLOVE BIOGEL PI INDICATOR 8 (GLOVE) ×2
GLOVE SURG SS PI 6.5 STRL IVOR (GLOVE) ×3 IMPLANT
GOWN STRL REUS W/TWL LRG LVL3 (GOWN DISPOSABLE) ×9 IMPLANT
HANDPIECE INTERPULSE COAX TIP (DISPOSABLE) ×2
HOLDER FOLEY CATH W/STRAP (MISCELLANEOUS) IMPLANT
IMMOBILIZER KNEE 20 (SOFTGOODS) ×3
IMMOBILIZER KNEE 20 THIGH 36 (SOFTGOODS) ×1 IMPLANT
INSERT PFC SIG STB SZ3 15.0MM (Knees) ×3 IMPLANT
KIT TURNOVER KIT A (KITS) IMPLANT
MANIFOLD NEPTUNE II (INSTRUMENTS) ×3 IMPLANT
NS IRRIG 1000ML POUR BTL (IV SOLUTION) ×3 IMPLANT
PACK TOTAL KNEE CUSTOM (KITS) ×3 IMPLANT
PADDING CAST COTTON 6X4 STRL (CAST SUPPLIES) ×6 IMPLANT
PATELLA DOME PFC 35MM (Knees) ×3 IMPLANT
PENCIL SMOKE EVACUATOR (MISCELLANEOUS) ×3 IMPLANT
PIN STEINMAN FIXATION KNEE (PIN) ×3 IMPLANT
PROTECTOR NERVE ULNAR (MISCELLANEOUS) ×3 IMPLANT
SET HNDPC FAN SPRY TIP SCT (DISPOSABLE) ×1 IMPLANT
SPONGE GAUZE 2X2 STER 10/PKG (GAUZE/BANDAGES/DRESSINGS) ×2
STRIP CLOSURE SKIN 1/2X4 (GAUZE/BANDAGES/DRESSINGS) ×4 IMPLANT
SUT MNCRL AB 4-0 PS2 18 (SUTURE) ×3 IMPLANT
SUT STRATAFIX 0 PDS 27 VIOLET (SUTURE) ×3
SUT VIC AB 2-0 CT1 27 (SUTURE) ×6
SUT VIC AB 2-0 CT1 TAPERPNT 27 (SUTURE) ×3 IMPLANT
SUTURE STRATFX 0 PDS 27 VIOLET (SUTURE) ×1 IMPLANT
TIBIA MBT CEMENT SIZE 2.5 (Knees) ×3 IMPLANT
TRAY FOLEY MTR SLVR 14FR STAT (SET/KITS/TRAYS/PACK) ×3 IMPLANT
WATER STERILE IRR 1000ML POUR (IV SOLUTION) ×6 IMPLANT
WRAP KNEE MAXI GEL POST OP (GAUZE/BANDAGES/DRESSINGS) ×3 IMPLANT
YANKAUER SUCT BULB TIP 10FT TU (MISCELLANEOUS) ×3 IMPLANT

## 2019-07-29 NOTE — Evaluation (Signed)
Physical Therapy Evaluation Patient Details Name: Julie Bonilla MRN: [id number] DOB: 12-Feb-1941 Today's Date: 07/29/2019   History of Present Illness  Patient is 79 y.o. female s/p Rt TKA on 07/29/19 with PMH significant for HTN, HLD, ADHD, OA, anxiety, GERD, and is HOH (wear hearing aids)    Clinical Impression  Julie Bonilla is a 79 y.o. female POD 0 s/p Rt TKA. Patient reports independence with mobility at baseline. Patient is now limited by functional impairments (see PT problem list below) and requires min assist for transfers and gait with RW. Patient was able to ambulate ~30 feet with RW and min assist to steady and manage RW. Patient instructed in exercise to facilitate ROM and circulation. Patient will benefit from continued skilled PT interventions to address impairments and progress towards PLOF. Acute PT will follow to progress mobility and stair training in preparation for safe discharge home.     Follow Up Recommendations Follow surgeon's recommendation for DC plan and follow-up therapies    Equipment Recommendations  Rolling walker with 5" wheels(youth)    Recommendations for Other Services       Precautions / Restrictions Precautions Precautions: Fall Restrictions Weight Bearing Restrictions: No      Mobility  Bed Mobility Overal bed mobility: Needs Assistance Bed Mobility: Supine to Sit     Supine to sit: HOB elevated;Min assist     General bed mobility comments: assis to raise trunk upright to sit EOB  Transfers Overall transfer level: Needs assistance Equipment used: Rolling walker (2 wheeled) Transfers: Sit to/from Stand Sit to Stand: Min assist         General transfer comment: cues for hand placement with RW and assist to initiate power up and steady with rising  Ambulation/Gait Ambulation/Gait assistance: Min assist Gait Distance (Feet): 30 Feet Assistive device: Rolling walker (2 wheeled) Gait Pattern/deviations: Step-to pattern;Decreased  stance time - right;Decreased step length - left;Decreased stride length Gait velocity: slow   General Gait Details: cues for safe proximity to RW and safe step pattern, assist for walker positioning and management through turns  Science writer    Modified Rankin (Stroke Patients Only)       Balance Overall balance assessment: Needs assistance   Sitting balance-Leahy Scale: Good     Standing balance support: Bilateral upper extremity supported;During functional activity Standing balance-Leahy Scale: Poor            Pertinent Vitals/Pain Pain Assessment: 0-10 Pain Score: 6  Pain Location: Rt knee Pain Descriptors / Indicators: Aching;Sore Pain Intervention(s): Limited activity within patient's tolerance;Monitored during session;Repositioned;Ice applied    Home Living Family/patient expects to be discharged to:: Private residence Living Arrangements: Other relatives(pt lives with her twin sister) Available Help at Discharge: Family;Available 24 hours/day Type of Home: House Home Access: Stairs to enter Entrance Stairs-Rails: None Entrance Stairs-Number of Steps: 1 or 2  no railing Home Layout: One level Home Equipment: Toilet riser;Cane - single point      Prior Function Level of Independence: Independent         Comments: pt does not use AD for mobility but reports "I am very careful"     Hand Dominance   Dominant Hand: Left    Extremity/Trunk Assessment   Upper Extremity Assessment Upper Extremity Assessment: Overall WFL for tasks assessed    Lower Extremity Assessment Lower Extremity Assessment: Generalized weakness;RLE deficits/detail RLE Deficits / Details: good quad activation and no extensor  lag with SLR, some mild buckling in standing RLE Sensation: WNL RLE Coordination: WNL    Cervical / Trunk Assessment Cervical / Trunk Assessment: Normal  Communication   Communication: HOH  Cognition Arousal/Alertness:  Awake/alert Behavior During Therapy: WFL for tasks assessed/performed Overall Cognitive Status: Within Functional Limits for tasks assessed           General Comments      Exercises Total Joint Exercises Ankle Circles/Pumps: AROM;Seated;Both;20 reps Quad Sets: AROM;5 reps;Seated;Right   Assessment/Plan    PT Assessment Patient needs continued PT services  PT Problem List Decreased strength;Decreased mobility;Decreased range of motion;Decreased activity tolerance;Decreased balance;Decreased knowledge of use of DME       PT Treatment Interventions DME instruction;Therapeutic exercise;Balance training;Gait training;Stair training;Functional mobility training;Therapeutic activities;Patient/family education    PT Goals (Current goals can be found in the Care Plan section)  Acute Rehab PT Goals Patient Stated Goal: return home and get back to walking with no device PT Goal Formulation: With patient Time For Goal Achievement: 08/05/19 Potential to Achieve Goals: Good    Frequency 7X/week    AM-PAC PT "6 Clicks" Mobility  Outcome Measure Help needed turning from your back to your side while in a flat bed without using bedrails?: A Little Help needed moving from lying on your back to sitting on the side of a flat bed without using bedrails?: A Little Help needed moving to and from a bed to a chair (including a wheelchair)?: A Little Help needed standing up from a chair using your arms (e.g., wheelchair or bedside chair)?: A Little Help needed to walk in hospital room?: A Little Help needed climbing 3-5 steps with a railing? : A Lot 6 Click Score: 17    End of Session Equipment Utilized During Treatment: Gait belt Activity Tolerance: Patient tolerated treatment well Patient left: in chair;with chair alarm set;with call bell/phone within reach Nurse Communication: Mobility status PT Visit Diagnosis: Muscle weakness (generalized) (M62.81);Difficulty in walking, not elsewhere  classified (R26.2)    Time: [id number] PT Time Calculation (min) (ACUTE ONLY): 30 min   Charges:   PT Evaluation $PT Eval Low Complexity: 1 Low PT Treatments $Gait Training: 8-22 mins        Verner Mould, DPT Physical Therapist with [facility] [phone]  07/29/2019 3:16 PM

## 2019-07-29 NOTE — Progress Notes (Signed)
AssistedDr. Moser with right, ultrasound guided, adductor canal block. Side rails up, monitors on throughout procedure. See vital signs in flow sheet. Tolerated Procedure well.  

## 2019-07-29 NOTE — Discharge Instructions (Addendum)
Julie Arabian, MD Total Joint Specialist [facility] [address] [place], [place] [address] [phone]  TOTAL KNEE REPLACEMENT POSTOPERATIVE DIRECTIONS    Knee Rehabilitation, Guidelines Following Surgery  Results after knee surgery are often greatly improved when you follow the exercise, range of motion and muscle strengthening exercises prescribed by your doctor. Safety measures are also important to protect the knee from further injury. If any of these exercises cause you to have increased pain or swelling in your knee joint, decrease the amount until you are comfortable again and slowly increase them. If you have problems or questions, call your caregiver or physical therapist for advice.   BLOOD CLOT PREVENTION . Take Xarelto 10mg  daily for three weeks to prevent blood clots  HOME CARE INSTRUCTIONS  . Remove items at home which could result in a fall. This includes throw rugs or furniture in walking pathways.  . ICE to the affected knee as much as tolerated. Icing helps control swelling. If the swelling is well controlled you will be more comfortable and rehab easier. Continue to use ice on the knee for pain and swelling from surgery. You may notice swelling that will progress down to the foot and ankle. This is normal after surgery. Elevate the leg when you are not up walking on it.    . Continue to use the breathing machine which will help keep your temperature down. It is common for your temperature to cycle up and down following surgery, especially at night when you are not up moving around and exerting yourself. The breathing machine keeps your lungs expanded and your temperature down. . Do not place pillow under the operative knee, focus on keeping the knee straight while resting  DIET You may resume your previous home diet once you are discharged from the hospital.  DRESSING / Dora / SHOWERING . Remove the bulky dressing 2 days  following surgery, this will include an ACE wrap and rolled gauze. Leave the tape strips across the incision in place until your first follow-up appointment. Cover the incision with 4x4 gauze and paper tape. Change the dressing daily with new gauze and paper tape for 7-10 days following surgery. . You may begin showering 3 days following surgery, but do not submerge the incision under water. . Allow water and soap to run over the incision, pat dry, and apply a new gauze dressing  ACTIVITY For the first 5 days, the key is rest and control of pain and swelling . Do your home exercises twice a day starting on post-operative day 3. On the days you go to physical therapy, just do the home exercises once that day. . You should rest, ice and elevate the leg for 50 minutes out of every hour. Get up and walk/stretch for 10 minutes per hour. After 5 days you can increase your activity slowly as tolerated. . Walk with your walker as instructed. Use the walker until you are comfortable transitioning to a cane. Walk with the cane in the opposite hand of the operative leg. You may discontinue the cane once you are comfortable and walking steadily. . Avoid periods of inactivity such as sitting longer than an hour when not asleep. This helps prevent blood clots.  . You may discontinue the knee immobilizer once you are able to perform a straight leg raise while lying down. . You may resume a sexual relationship in one month or when given the OK by your doctor.  . You may  return to work once you are cleared by your doctor.  . Do not drive a car for 6 weeks or until released by you surgeon.  . Do not drive while taking narcotics.  TED HOSE STOCKINGS Wear the elastic stockings on both legs for three weeks following surgery during the day. You may remove them at night for sleeping.  WEIGHT BEARING Weight bearing as tolerated with assist device (walker, cane, etc) as directed, use it as long as suggested by your  surgeon or therapist, typically at least 4-6 weeks.  POSTOPERATIVE CONSTIPATION PROTOCOL Constipation - defined medically as fewer than three stools per week and severe constipation as less than one stool per week.  One of the most common issues patients have following surgery is constipation.  Even if you have a regular bowel pattern at home, your normal regimen is likely to be disrupted due to multiple reasons following surgery.  Combination of anesthesia, postoperative narcotics, change in appetite and fluid intake all can affect your bowels.  In order to avoid complications following surgery, here are some recommendations in order to help you during your recovery period.  . Colace (docusate) - Pick up an over-the-counter form of Colace or another stool softener and take twice a day as long as you are requiring postoperative pain medications.  Take with a full glass of water daily.  If you experience loose stools or diarrhea, hold the colace until you stool forms back up. If your symptoms do not get better within 1 week or if they get worse, check with your doctor. . Dulcolax (bisacodyl) - Pick up over-the-counter and take as directed by the product packaging as needed to assist with the movement of your bowels.  Take with a full glass of water.  Use this product as needed if not relieved by Colace only.  . MiraLax (polyethylene glycol) - Pick up over-the-counter to have on hand. MiraLax is a solution that will increase the amount of water in your bowels to assist with bowel movements.  Take as directed and can mix with a glass of water, juice, soda, coffee, or tea. Take if you go more than two days without a movement. Do not use MiraLax more than once per day. Call your doctor if you are still constipated or irregular after using this medication for 7 days in a row.  If you continue to have problems with postoperative constipation, please contact the office for further assistance and recommendations.   If you experience "the worst abdominal pain ever" or develop nausea or vomiting, please contact the office immediatly for further recommendations for treatment.  ITCHING If you experience itching with your medications, try taking only a single pain pill, or even half a pain pill at a time.  You can also use Benadryl over the counter for itching or also to help with sleep.   MEDICATIONS See your medication summary on the "After Visit Summary" that the nursing staff will review with you prior to discharge.  You may have some home medications which will be placed on hold until you complete the course of blood thinner medication.  It is important for you to complete the blood thinner medication as prescribed by your surgeon.  Continue your approved medications as instructed at time of discharge.  Information on my medicine - XARELTO (Rivaroxaban)  Why was Xarelto prescribed for you? Xarelto was prescribed for you to reduce the risk of blood clots forming after orthopedic surgery. The medical term for these abnormal blood  clots is venous thromboembolism (VTE).  What do you need to know about xarelto ? Take your Xarelto ONCE DAILY at the same time every day. You may take it either with or without food.  If you have difficulty swallowing the tablet whole, you may crush it and mix in applesauce just prior to taking your dose.  Take Xarelto exactly as prescribed by your doctor and DO NOT stop taking Xarelto without talking to the doctor who prescribed the medication.  Stopping without other VTE prevention medication to take the place of Xarelto may increase your risk of developing a clot.  After discharge, you should have regular check-up appointments with your healthcare provider that is prescribing your Xarelto.    What do you do if you miss a dose? If you miss a dose, take it as soon as you remember on the same day then continue your regularly scheduled once daily regimen the next day. Do  not take two doses of Xarelto on the same day.   Important Safety Information A possible side effect of Xarelto is bleeding. You should call your healthcare provider right away if you experience any of the following: ? Bleeding from an injury or your nose that does not stop. ? Unusual colored urine (red or dark brown) or unusual colored stools (red or black). ? Unusual bruising for unknown reasons. ? A serious fall or if you hit your head (even if there is no bleeding).  Some medicines may interact with Xarelto and might increase your risk of bleeding while on Xarelto. To help avoid this, consult your healthcare provider or pharmacist prior to using any new prescription or non-prescription medications, including herbals, vitamins, non-steroidal anti-inflammatory drugs (NSAIDs) and supplements.  This website has more information on Xarelto: [email or web address]   PRECAUTIONS . If you experience chest pain or shortness of breath - call 911 immediately for transfer to the hospital emergency department.  . If you develop a fever greater that 101 F, purulent drainage from wound, increased redness or drainage from wound, foul odor from the wound/dressing, or calf pain - CONTACT YOUR SURGEON.                                                   FOLLOW-UP APPOINTMENTS Make sure you keep all of your appointments after your operation with your surgeon and caregivers. You should call the office at the above phone number and make an appointment for approximately two weeks after the date of your surgery or on the date instructed by your surgeon outlined in the "After Visit Summary".  RANGE OF MOTION AND STRENGTHENING EXERCISES  Rehabilitation of the knee is important following a knee injury or an operation. After just a few days of immobilization, the muscles of the thigh which control the knee become weakened and shrink (atrophy). Knee exercises are designed to build up the tone and strength of the thigh  muscles and to improve knee motion. Often times heat used for twenty to thirty minutes before working out will loosen up your tissues and help with improving the range of motion but do not use heat for the first two weeks following surgery. These exercises can be done on a training (exercise) mat, on the floor, on a table or on a bed. Use what ever works the best and is most comfortable for you Knee exercises  include:  . Leg Lifts - While your knee is still immobilized in a splint or cast, you can do straight leg raises. Lift the leg to 60 degrees, hold for 3 sec, and slowly lower the leg. Repeat 10-20 times 2-3 times daily. Perform this exercise against resistance later as your knee gets better.  Javier Docker and Hamstring Sets - Tighten up the muscle on the front of the thigh (Quad) and hold for 5-10 sec. Repeat this 10-20 times hourly. Hamstring sets are done by pushing the foot backward against an object and holding for 5-10 sec. Repeat as with quad sets.   Leg Slides: Lying on your back, slowly slide your foot toward your buttocks, bending your knee up off the floor (only go as far as is comfortable). Then slowly slide your foot back down until your leg is flat on the floor again.  Angel Wings: Lying on your back spread your legs to the side as far apart as you can without causing discomfort.  A rehabilitation program following serious knee injuries can speed recovery and prevent re-injury in the future due to weakened muscles. Contact your doctor or a physical therapist for more information on knee rehabilitation.   IF YOU ARE TRANSFERRED TO A SKILLED REHAB FACILITY If the patient is transferred to a skilled rehab facility following release from the hospital, a list of the current medications will be sent to the facility for the patient to continue.  When discharged from the skilled rehab facility, please have the facility set up the patient's [place] prior to being released. Also, the  skilled facility will be responsible for providing the patient with their medications at time of release from the facility to include their pain medication, the muscle relaxants, and their blood thinner medication. If the patient is still at the rehab facility at time of the two week follow up appointment, the skilled rehab facility will also need to assist the patient in arranging follow up appointment in our office and any transportation needs.  MAKE SURE YOU:  . Understand these instructions.  . Get help right away if you are not doing well or get worse.    Pick up stool softner and laxative for home use following surgery while on pain medications. Do not submerge incision under water. Please use good hand washing techniques while changing dressing each day. May shower starting three days after surgery. Please use a clean towel to pat the incision dry following showers. Continue to use ice for pain and swelling after surgery. Do not use any lotions or creams on the incision until instructed by your surgeon.

## 2019-07-29 NOTE — Plan of Care (Signed)
  Problem: Education: Goal: Knowledge of General Education information will improve Description: Including pain rating scale, medication(s)/side effects and non-pharmacologic comfort measures Outcome: Progressing   Problem: Clinical Measurements: Goal: Ability to maintain clinical measurements within normal limits will improve Outcome: Progressing   Problem: Activity: Goal: Risk for activity intolerance will decrease Outcome: Progressing   Problem: Nutrition: Goal: Adequate nutrition will be maintained Outcome: Progressing   Problem: Pain Managment: Goal: General experience of comfort will improve Outcome: Progressing   Problem: Safety: Goal: Ability to remain free from injury will improve Outcome: Progressing   Problem: Pain Management: Goal: Pain level will decrease with appropriate interventions Outcome: Progressing

## 2019-07-29 NOTE — Interval H&P Note (Signed)
History and Physical Interval Note:  07/29/2019 6:48 AM  Julie Bonilla  has presented today for surgery, with the diagnosis of right knee osteoarthritis.  The various methods of treatment have been discussed with the patient and family. After consideration of risks, benefits and other options for treatment, the patient has consented to  Procedure(s) with comments: TOTAL KNEE ARTHROPLASTY (Right) - 24min as a surgical intervention.  The patient's history has been reviewed, patient examined, no change in status, stable for surgery.  I have reviewed the patient's chart and labs.  Questions were answered to the patient's satisfaction.     Julie Bonilla

## 2019-07-29 NOTE — Anesthesia Preprocedure Evaluation (Addendum)
Anesthesia Evaluation  Patient identified by MRN, date of birth, ID band Patient awake    Reviewed: Allergy & Precautions, NPO status , Patient's Chart, lab work & pertinent test results  History of Anesthesia Complications Negative for: history of anesthetic complications  Airway Mallampati: II  TM Distance: >3 FB Neck ROM: Full    Dental  (+) Dental Advisory Given, Teeth Intact   Pulmonary neg recent URI,    breath sounds clear to auscultation       Cardiovascular hypertension, Pt. on medications (-) angina(-) Past MI and (-) CHF (-) dysrhythmias  Rhythm:Regular     Neuro/Psych PSYCHIATRIC DISORDERS Anxiety Depression    GI/Hepatic Neg liver ROS, GERD  ,  Endo/Other  negative endocrine ROS  Renal/GU negative Renal ROS     Musculoskeletal  (+) Arthritis ,   Abdominal   Peds  Hematology negative hematology ROS (+) Plts 268 INR 1 PTT 39 No anticoags   Anesthesia Other Findings   Reproductive/Obstetrics                            Anesthesia Physical Anesthesia Plan  ASA: II  Anesthesia Plan: MAC, Regional and Spinal   Post-op Pain Management:  Regional for Post-op pain   Induction: Intravenous  PONV Risk Score and Plan: 2 and Treatment may vary due to age or medical condition and Propofol infusion  Airway Management Planned: Nasal Cannula  Additional Equipment: None  Intra-op Plan:   Post-operative Plan:   Informed Consent: I have reviewed the patients History and Physical, chart, labs and discussed the procedure including the risks, benefits and alternatives for the proposed anesthesia with the patient or authorized representative who has indicated his/her understanding and acceptance.     Dental advisory given  Plan Discussed with: CRNA and Surgeon  Anesthesia Plan Comments: (Hearing aides )       Anesthesia Quick Evaluation

## 2019-07-29 NOTE — Anesthesia Procedure Notes (Signed)
Procedure Name: MAC Date/Time: 07/29/2019 8:17 AM Performed by: Eben Burow, CRNA Pre-anesthesia Checklist: Patient identified, Emergency Drugs available, Suction available, Patient being monitored and Timeout performed Oxygen Delivery Method: Simple face mask Dental Injury: Teeth and Oropharynx as per pre-operative assessment

## 2019-07-29 NOTE — Anesthesia Procedure Notes (Signed)
Anesthesia Regional Block: Adductor canal block   Pre-Anesthetic Checklist: ,, timeout performed, Correct Patient, Correct Site, Correct Laterality, Correct Procedure, Correct Position, site marked, Risks and benefits discussed,  Surgical consent,  Pre-op evaluation,  At surgeon's request and post-op pain management  Laterality: Right and Lower  Prep: chloraprep       Needles:  Injection technique: Single-shot     Needle Length: 9cm  Needle Gauge: 22     Additional Needles: Arrow StimuQuik ECHO Echogenic Stimulating PNB Needle  Procedures:,,,, ultrasound used (permanent image in chart),,,,  Narrative:  Start time: 07/29/2019 7:14 AM End time: 07/29/2019 7:18 AM Injection made incrementally with aspirations every 5 mL.  Performed by: Personally  Anesthesiologist: Oleta Mouse, MD

## 2019-07-29 NOTE — Transfer of Care (Signed)
Immediate Anesthesia Transfer of Care Note  Patient: Julie Bonilla  Procedure(s) Performed: TOTAL KNEE ARTHROPLASTY (Right Knee)  Patient Location: PACU  Anesthesia Type:Spinal  Level of Consciousness: awake, alert  and oriented  Airway & Oxygen Therapy: Patient Spontanous Breathing and Patient connected to face mask oxygen  Post-op Assessment: Report given to RN and Post -op Vital signs reviewed and stable  Post vital signs: Reviewed and stable  Last Vitals:  Vitals Value Taken Time  BP 129/77 07/29/19 0948  Temp    Pulse 64 07/29/19 0951  Resp 13 07/29/19 0951  SpO2 100 % 07/29/19 0951  Vitals shown include unvalidated device data.  Last Pain:  Vitals:   07/29/19 0807  TempSrc:   PainSc: 0-No pain         Complications: No apparent anesthesia complications

## 2019-07-29 NOTE — Anesthesia Procedure Notes (Addendum)
Spinal  Patient location during procedure: OR Start time: 07/29/2019 8:21 AM Staffing Performed: resident/CRNA  Anesthesiologist: Oleta Mouse, MD Resident/CRNA: Eben Burow, CRNA Preanesthetic Checklist Completed: patient identified, IV checked, site marked, risks and benefits discussed, surgical consent, monitors and equipment checked, pre-op evaluation and timeout performed Spinal Block Patient position: sitting Prep: DuraPrep and site prepped and draped Patient monitoring: heart rate, cardiac monitor, continuous pulse ox and blood pressure Approach: midline Location: L3-4 Injection technique: single-shot Needle Needle type: Pencan  Needle gauge: 24 G Needle length: 10 cm Assessment Sensory level: T4 Additional Notes Pt placed in sitting position, spinal kit expiration date checked and verified, + CSF, - heme, pt tolerated well. Dr Ermalene Postin present and supervising throughout.

## 2019-07-29 NOTE — Op Note (Signed)
OPERATIVE REPORT-TOTAL KNEE ARTHROPLASTY   Pre-operative diagnosis- Osteoarthritis  Right knee(s)  Post-operative diagnosis- Osteoarthritis Right knee(s)  Procedure-  Right  Total Knee Arthroplasty  Surgeon- Dione Plover. Shakeema Lippman, MD  Assistant- Theresa Duty, PA-C   Anesthesia-  Adductor canal block and spinal  EBL-25 ml  Drains Hemovac  Tourniquet time-  Total Tourniquet Time Documented: Thigh (Right) - 35 minutes Total: Thigh (Right) - 35 minutes     Complications- None  Condition-PACU - hemodynamically stable.   Brief Clinical Note  Julie Bonilla is a 79 y.o. year old female with end stage OA of her right knee with progressively worsening pain and dysfunction. She has constant pain, with activity and at rest and significant functional deficits with difficulties even with ADLs. She has had extensive non-op management including analgesics, injections of cortisone and viscosupplements, and home exercise program, but remains in significant pain with significant dysfunction.Radiographs show bone on bone arthritis lateral and patellofemoral. She presents now for right Total Knee Arthroplasty.    Procedure in detail---   The patient is brought into the operating room and positioned supine on the operating table. After successful administration of  Adductor canal block and spinal,   a tourniquet is placed high on the  Right thigh(s) and the lower extremity is prepped and draped in the usual sterile fashion. Time out is performed by the operating team and then the  Right lower extremity is wrapped in Esmarch, knee flexed and the tourniquet inflated to 300 mmHg.       A midline incision is made with a ten blade through the subcutaneous tissue to the level of the extensor mechanism. A fresh blade is used to make a medial parapatellar arthrotomy. Soft tissue over the proximal medial tibia is subperiosteally elevated to the joint line with a knife and into the semimembranosus bursa with a  Cobb elevator. Soft tissue over the proximal lateral tibia is elevated with attention being paid to avoiding the patellar tendon on the tibial tubercle. The patella is everted, knee flexed 90 degrees and the ACL and PCL are removed. Findings are bone on bone lateral and patellofemoral with large global osteophytes and multiple loose bodies.        The drill is used to create a starting hole in the distal femur and the canal is thoroughly irrigated with sterile saline to remove the fatty contents. The 5 degree Right  valgus alignment guide is placed into the femoral canal and the distal femoral cutting block is pinned to remove 10 mm off the distal femur. Resection is made with an oscillating saw.      The tibia is subluxed forward and the menisci are removed. The extramedullary alignment guide is placed referencing proximally at the medial aspect of the tibial tubercle and distally along the second metatarsal axis and tibial crest. The block is pinned to remove 79mm off the more deficient lateral  side. Resection is made with an oscillating saw. Size 2.5is the most appropriate size for the tibia and the proximal tibia is prepared with the modular drill and keel punch for that size.      The femoral sizing guide is placed and size 3 is most appropriate. Rotation is marked off the epicondylar axis and confirmed by creating a rectangular flexion gap at 90 degrees. The size 3 cutting block is pinned in this rotation and the anterior, posterior and chamfer cuts are made with the oscillating saw. The intercondylar block is then placed and that cut is  made.      Trial size 2.5 tibial component, trial size 3 posterior stabilized femur and a 15  mm posterior stabilized rotating platform insert trial is placed. Full extension is achieved with excellent varus/valgus and anterior/posterior balance throughout full range of motion. The patella is everted and thickness measured to be 22  mm. Free hand resection is taken to 12  mm, a 35 template is placed, lug holes are drilled, trial patella is placed, and it tracks normally. Osteophytes are removed off the posterior femur with the trial in place. All trials are removed and the cut bone surfaces prepared with pulsatile lavage. Cement is mixed and once ready for implantation, the size 2.5 tibial implant, size  3 posterior stabilized femoral component, and the size 35 patella are cemented in place and the patella is held with the clamp. The trial insert is placed and the knee held in full extension. The Exparel (20 ml mixed with 60 ml saline) is injected into the extensor mechanism, posterior capsule, medial and lateral gutters and subcutaneous tissues.  All extruded cement is removed and once the cement is hard the permanent 15 mm posterior stabilized rotating platform insert is placed into the tibial tray.      The wound is copiously irrigated with saline solution and the extensor mechanism closed over a hemovac drain with #1 V-loc suture. The tourniquet is released for a total tourniquet time of 34  minutes. Flexion against gravity is 140 degrees and the patella tracks normally. Subcutaneous tissue is closed with 2.0 vicryl and subcuticular with running 4.0 Monocryl. The incision is cleaned and dried and steri-strips and a bulky sterile dressing are applied. The limb is placed into a knee immobilizer and the patient is awakened and transported to recovery in stable condition.      Please note that a surgical assistant was a medical necessity for this procedure in order to perform it in a safe and expeditious manner. Surgical assistant was necessary to retract the ligaments and vital neurovascular structures to prevent injury to them and also necessary for proper positioning of the limb to allow for anatomic placement of the prosthesis.   Dione Plover Julie Maenza, MD    07/29/2019, 9:24 AM

## 2019-07-29 NOTE — Plan of Care (Signed)
Plan of care 

## 2019-07-30 ENCOUNTER — Encounter: Payer: Self-pay | Admitting: *Deleted

## 2019-07-30 LAB — BASIC METABOLIC PANEL
Anion gap: 5 (ref 5–15)
BUN: 15 mg/dL (ref 8–23)
CO2: 26 mmol/L (ref 22–32)
Calcium: 7.9 mg/dL — ABNORMAL LOW (ref 8.9–10.3)
Chloride: 109 mmol/L (ref 98–111)
Creatinine, Ser: 0.67 mg/dL (ref 0.44–1.00)
GFR calc Af Amer: >60 mL/min (ref >60)
GFR calc non Af Amer: >60 mL/min (ref >60)
Glucose, Bld: 113 mg/dL — ABNORMAL HIGH (ref 70–99)
Potassium: 4.2 mmol/L (ref 3.5–5.1)
Sodium: 140 mmol/L (ref 135–145)

## 2019-07-30 LAB — CBC
HCT: 31.9 % — ABNORMAL LOW (ref 36.0–46.0)
Hemoglobin: 10.2 g/dL — ABNORMAL LOW (ref 12.0–15.0)
MCH: 31.8 pg (ref 26.0–34.0)
MCHC: 32 g/dL (ref 30.0–36.0)
MCV: 99.4 fL (ref 80.0–100.0)
Platelets: 200 10*3/uL (ref 150–400)
RBC: 3.21 MIL/uL — ABNORMAL LOW (ref 3.87–5.11)
RDW: 12.9 % (ref 11.5–15.5)
WBC: 9.5 10*3/uL (ref 4.0–10.5)
nRBC: 0 % (ref 0.0–0.2)

## 2019-07-30 MED ORDER — METHOCARBAMOL 500 MG PO TABS: 500.0000 mg | ORAL_TABLET | Freq: Four times a day (QID) | ORAL | 0 refills | Status: DC | PRN

## 2019-07-30 MED ORDER — OXYCODONE HCL 5 MG PO TABS: 5.0000 mg | ORAL_TABLET | Freq: Four times a day (QID) | ORAL | 0 refills | Status: DC | PRN

## 2019-07-30 MED ORDER — RIVAROXABAN 10 MG PO TABS: 10.0000 mg | ORAL_TABLET | Freq: Every day | ORAL | 0 refills | Status: DC

## 2019-07-30 MED ORDER — GABAPENTIN 300 MG PO CAPS: 300.0000 mg | ORAL_CAPSULE | Freq: Three times a day (TID) | ORAL | 0 refills | Status: DC

## 2019-07-30 NOTE — Progress Notes (Signed)
Physical Therapy Treatment Patient Details Name: Julie Bonilla MRN: [id number] DOB: 1941/05/11 Today's Date: 07/30/2019    History of Present Illness Patient is 79 y.o. female s/p Rt TKA on 07/29/19 with PMH significant for HTN, HLD, ADHD, OA, anxiety, GERD, and is HOH (wear hearing aids)    PT Comments    Progressing with mobility. Will plan to have a 2nd session to continue to progress session. Possible d/c home later today if pt does well.    Follow Up Recommendations  Follow surgeon's recommendation for DC plan and follow-up therapies     Equipment Recommendations       Recommendations for Other Services       Precautions / Restrictions Precautions Precautions: Fall Restrictions Weight Bearing Restrictions: No Other Position/Activity Restrictions: WBAT    Mobility  Bed Mobility Overal bed mobility: Needs Assistance Bed Mobility: Supine to Sit     Supine to sit: HOB elevated;Min guard     General bed mobility comments: Close guard for safety. Increased time.  Transfers Overall transfer level: Needs assistance Equipment used: Rolling walker (2 wheeled) Transfers: Sit to/from Stand Sit to Stand: Min guard         General transfer comment: VCs safety, technique, hand/LE placement.  Ambulation/Gait Ambulation/Gait assistance: Min guard Gait Distance (Feet): 65 Feet Assistive device: Rolling walker (2 wheeled) Gait Pattern/deviations: Step-to pattern     General Gait Details: Close guard for safety. Slow gait speed. VCs safety, proper use of RW   Stairs             Wheelchair Mobility    Modified Rankin (Stroke Patients Only)       Balance Overall balance assessment: Mild deficits observed, not formally tested                                          Cognition Arousal/Alertness: Awake/alert Behavior During Therapy: WFL for tasks assessed/performed Overall Cognitive Status: Within Functional Limits for tasks assessed                                         Exercises Total Joint Exercises Ankle Circles/Pumps: AROM;Both;10 reps;Supine Quad Sets: AROM;Both;10 reps;Supine Heel Slides: AAROM;Right;10 reps;Supine Hip ABduction/ADduction: AROM;Right;10 reps;Supine Straight Leg Raises: AROM;Right;10 reps;Supine Goniometric ROM: ~5-65 degrees    General Comments        Pertinent Vitals/Pain Pain Assessment: 0-10 Pain Score: 7  Pain Location: R knee Pain Descriptors / Indicators: Discomfort;Sore;Aching Pain Intervention(s): Monitored during session;Repositioned;Ice applied    Home Living                      Prior Function            PT Goals (current goals can now be found in the care plan section) Progress towards PT goals: Progressing toward goals    Frequency    7X/week      PT Plan Current plan remains appropriate    Co-evaluation              AM-PAC PT "6 Clicks" Mobility   Outcome Measure  Help needed turning from your back to your side while in a flat bed without using bedrails?: A Little Help needed moving from lying on your back to sitting on the side of a flat  bed without using bedrails?: A Little Help needed moving to and from a bed to a chair (including a wheelchair)?: A Little Help needed standing up from a chair using your arms (e.g., wheelchair or bedside chair)?: A Little Help needed to walk in hospital room?: A Little Help needed climbing 3-5 steps with a railing? : A Little 6 Click Score: 18    End of Session Equipment Utilized During Treatment: Gait belt Activity Tolerance: Patient tolerated treatment well Patient left: in chair;with call bell/phone within reach;with chair alarm set   PT Visit Diagnosis: Other abnormalities of gait and mobility (R26.89)     Time: [id number] PT Time Calculation (min) (ACUTE ONLY): 22 min  Charges:  $Gait Training: 8-22 mins                         Doreatha Massed, PT Acute Rehabilitation

## 2019-07-30 NOTE — Progress Notes (Signed)
Physical Therapy Treatment Patient Details Name: Julie Bonilla MRN: [id number] DOB: 1940/11/07 Today's Date: 07/30/2019    History of Present Illness Patient is 79 y.o. female s/p Rt TKA on 07/29/19 with PMH significant for HTN, HLD, ADHD, OA, anxiety, GERD, and is HOH (wear hearing aids)    PT Comments    Progressing with mobility. Reviewed exercises, gait training, and stair training. Issued HEP for pt to perform 2-3x/day until OP PT begins. All education completed. Okay to d/c from PT standpoint.    Follow Up Recommendations  Follow surgeon's recommendation for DC plan and follow-up therapies     Equipment Recommendations       Recommendations for Other Services       Precautions / Restrictions Precautions Precautions: Fall Restrictions Weight Bearing Restrictions: No Other Position/Activity Restrictions: WBAT    Mobility  Bed Mobility Overal bed mobility: Needs Assistance Bed Mobility: Supine to Sit     Supine to sit: HOB elevated;Min guard     General bed mobility comments: Close guard for safety. Increased time.  Transfers Overall transfer level: Needs assistance Equipment used: Rolling walker (2 wheeled) Transfers: Sit to/from Stand Sit to Stand: Min guard         General transfer comment: VCs safety, technique, hand/LE placement.  Ambulation/Gait Ambulation/Gait assistance: Min guard Gait Distance (Feet): 100 Feet Assistive device: Rolling walker (2 wheeled) Gait Pattern/deviations: Step-to pattern     General Gait Details: Close guard for safety. Slow gait speed. VCs safety, proper use of RW   Stairs Stairs: Yes Stairs assistance: Min assist Stair Management: Forwards;With walker;Step to pattern Number of Stairs: 1 General stair comments: Assist to steady. VCs safety, technique, sequence.   Wheelchair Mobility    Modified Rankin (Stroke Patients Only)       Balance Overall balance assessment: Mild deficits observed, not formally  tested         Standing balance support: Bilateral upper extremity supported Standing balance-Leahy Scale: Poor                              Cognition Arousal/Alertness: Awake/alert Behavior During Therapy: WFL for tasks assessed/performed Overall Cognitive Status: Within Functional Limits for tasks assessed                                        Exercises      General Comments        Pertinent Vitals/Pain Pain Assessment: 0-10 Pain Score: 7  Pain Location: R knee Pain Descriptors / Indicators: Discomfort;Sore;Aching Pain Intervention(s): Monitored during session;Repositioned;Ice applied    Home Living                      Prior Function            PT Goals (current goals can now be found in the care plan section) Progress towards PT goals: Progressing toward goals    Frequency    7X/week      PT Plan Current plan remains appropriate    Co-evaluation              AM-PAC PT "6 Clicks" Mobility   Outcome Measure  Help needed turning from your back to your side while in a flat bed without using bedrails?: A Little Help needed moving from lying on your back to sitting on the side  of a flat bed without using bedrails?: A Little Help needed moving to and from a bed to a chair (including a wheelchair)?: A Little Help needed standing up from a chair using your arms (e.g., wheelchair or bedside chair)?: A Little Help needed to walk in hospital room?: A Little Help needed climbing 3-5 steps with a railing? : A Little 6 Click Score: 18    End of Session Equipment Utilized During Treatment: Gait belt Activity Tolerance: Patient tolerated treatment well Patient left: in chair;with call bell/phone within reach   PT Visit Diagnosis: Other abnormalities of gait and mobility (R26.89)     Time: 1350-1414 PT Time Calculation (min) (ACUTE ONLY): 24 min  Charges:  $Gait Training: 23-37 mins                         Doreatha Massed, PT Acute Rehabilitation
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
## 2019-07-30 NOTE — Progress Notes (Signed)
D/C instructions given to patient. Patient had no questions. NT or writer will wheel patient out once family comes in  

## 2019-07-30 NOTE — Progress Notes (Signed)
RW delivered to the patient room by [employer].

## 2019-07-31 ENCOUNTER — Encounter: Payer: Self-pay | Admitting: Anesthesiology

## 2019-07-31 NOTE — Anesthesia Postprocedure Evaluation (Signed)
Anesthesia Post Note  Patient: FLOELLA ENSZ  Procedure(s) Performed: TOTAL KNEE ARTHROPLASTY (Right Knee)     Patient location during evaluation: PACU Anesthesia Type: Regional, MAC and Spinal Level of consciousness: awake and alert Pain management: pain level controlled Vital Signs Assessment: post-procedure vital signs reviewed and stable Respiratory status: spontaneous breathing, nonlabored ventilation, respiratory function stable and patient connected to nasal cannula oxygen Cardiovascular status: stable and blood pressure returned to baseline Postop Assessment: no apparent nausea or vomiting and spinal receding Anesthetic complications: no    Last Vitals:  Vitals:   07/30/19 0916 07/30/19 1330  BP: 122/75 (!) 142/77  Pulse: 94 82  Resp: 16 17  Temp: 37.7 C 36.7 C  SpO2: 93% 92%    Last Pain:  Vitals:   07/30/19 1425  TempSrc:   PainSc: 5                  Josefita Weissmann

## 2019-07-31 NOTE — Discharge Summary (Signed)
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 your activity slowly as tolerated. Walk with your walker as instructed. Use the walker until you are comfortable transitioning to a cane. Walk with the cane in the opposite hand of the operative leg. You may discontinue the cane once you are comfortable and walking steadily. Avoid periods of inactivity such as sitting longer than an hour when not asleep. This helps prevent blood clots.  You may discontinue the knee immobilizer once you are able to perform a straight leg raise while lying down. You may resume a sexual relationship in one month or when given the OK by your doctor.  You may return to work  once you are cleared by your doctor.  Do not drive a car for 6 weeks or until released by you surgeon.  Do not drive while taking narcotics.  TED HOSE STOCKINGS Wear the elastic stockings on both legs for three weeks following surgery during the day. You may remove them at night for sleeping.  WEIGHT BEARING Weight bearing as tolerated with assist device (walker, cane, etc) as directed, use it as long as suggested by your surgeon or therapist, typically at least 4-6 weeks.  POSTOPERATIVE CONSTIPATION PROTOCOL Constipation - defined medically as fewer than three stools per week and severe constipation as less than one stool per week.  One of the most common issues patients have following surgery is constipation.  Even if you have a regular bowel pattern at home, your normal regimen is likely to be disrupted due to multiple reasons following surgery.  Combination of anesthesia, postoperative narcotics, change in appetite and fluid intake all can affect your bowels.  In order to avoid complications following surgery, here are some recommendations in order to help you during your recovery period.  Colace (docusate) - Pick up an over-the-counter form of Colace or another stool softener and take twice a day as long as you are requiring postoperative pain medications.  Take with a full glass of water daily.  If you experience loose stools or diarrhea, hold the colace until you stool forms back up. If your symptoms do not get better within 1 week or if they get worse, check with your doctor. Dulcolax (bisacodyl) - Pick up over-the-counter and take as directed by the product packaging as needed to assist with the movement of your bowels.  Take with a full glass of water.  Use this product as needed if not relieved by Colace only.  MiraLax (polyethylene glycol) - Pick up over-the-counter to have on hand. MiraLax is a solution that will increase the amount of water in your bowels to assist with bowel  movements.  Take as directed and can mix with a glass of water, juice, soda, coffee, or tea. Take if you go more than two days without a movement. Do not use MiraLax more than once per day. Call your doctor if you are still constipated or irregular after using this medication for 7 days in a row.  If you continue to have problems with postoperative constipation, please contact the office for further assistance and recommendations.  If you experience "the worst abdominal pain ever" or develop nausea or vomiting, please contact the office immediatly for further recommendations for treatment.  ITCHING If you experience itching with your medications, try taking only a single pain pill, or even half a pain pill at a time.  You can also use Benadryl over the counter for itching or also to help with sleep.   MEDICATIONS See your medication summary on the "After Visit Summary" that the nursing staff will review with you prior to discharge.  You may have some home medications which will be placed on hold until you complete the course of blood thinner medication.  It is important for you to complete the blood thinner medication as prescribed by your surgeon.  Continue your approved medications as instructed at time of discharge.  Information on my medicine - XARELTO (Rivaroxaban)  Why was Xarelto prescribed for you? Xarelto was prescribed for you to reduce the risk of blood clots forming after orthopedic surgery. The medical term for these abnormal blood clots is venous thromboembolism (VTE).  What do you need to know about xarelto ? Take your Xarelto ONCE DAILY at the same time every day. You may take it either with or without food.  If you have difficulty swallowing the tablet whole, you may crush it and mix in applesauce just prior to taking your dose.  Take Xarelto exactly as prescribed by your doctor and DO NOT stop taking Xarelto without talking to the doctor who prescribed the medication.   Stopping without other VTE prevention medication to take the place of Xarelto may increase your risk of developing a clot.  After discharge, you should have regular check-up appointments with your healthcare provider that is prescribing your Xarelto.    What do you do if you miss a dose? If you miss a dose, take it as soon as you remember on the same day then continue your regularly scheduled once daily regimen the next day. Do not take two doses of Xarelto on the same day.   Important Safety Information A possible side effect of Xarelto is bleeding. You should call your healthcare provider right away if you experience any of the following: Bleeding from an injury or your nose that does not stop. Unusual colored urine (red or dark brown) or unusual colored stools (red or black). Unusual bruising for unknown reasons. A serious fall or if you  hit your head (even if there is no bleeding).  Some medicines may interact with Xarelto and might increase your risk of bleeding while on Xarelto. To help avoid this, consult your healthcare provider or pharmacist prior to using any new prescription or non-prescription medications, including herbals, vitamins, non-steroidal anti-inflammatory drugs (NSAIDs) and supplements.  This website has more information on Xarelto: [email or web address]   PRECAUTIONS If you experience chest pain or shortness of breath - call 911 immediately for transfer to the hospital emergency department.  If you develop a fever greater that 101 F, purulent drainage from wound, increased redness or drainage from wound, foul odor from the wound/dressing, or calf pain - CONTACT YOUR SURGEON.                                                   FOLLOW-UP APPOINTMENTS Make sure you keep all of your appointments after your operation with your surgeon and caregivers. You should call the office at the above phone number and make an appointment for approximately two weeks after the date of  your surgery or on the date instructed by your surgeon outlined in the "After Visit Summary".  RANGE OF MOTION AND STRENGTHENING EXERCISES  Rehabilitation of the knee is important following a knee injury or an operation. After just a few days of immobilization, the muscles of the thigh which control the knee become weakened and shrink (atrophy). Knee exercises are designed to build up the tone and strength of the thigh muscles and to improve knee motion. Often times heat used for twenty to thirty minutes before working out will loosen up your tissues and help with improving the range of motion but do not use heat for the first two weeks following surgery. These exercises can be done on a training (exercise) mat, on the floor, on a table or on a bed. Use what ever works the best and is most comfortable for you Knee exercises include:  Leg Lifts - While your knee is still immobilized in a splint or cast, you can do straight leg raises. Lift the leg to 60 degrees, hold for 3 sec, and slowly lower the leg. Repeat 10-20 times 2-3 times daily. Perform this exercise against resistance later as your knee gets better.  Quad and Hamstring Sets - Tighten up the muscle on the front of the thigh (Quad) and hold for 5-10 sec. Repeat this 10-20 times hourly. Hamstring sets are done by pushing the foot backward against an object and holding for 5-10 sec. Repeat as with quad sets.  Leg Slides: Lying on your back, slowly slide your foot toward your buttocks, bending your knee up off the floor (only go as far as is comfortable). Then slowly slide your foot back down until your leg is flat on the floor again. Angel Wings: Lying on your back spread your legs to the side as far apart as you can without causing discomfort.  A rehabilitation program following serious knee injuries can speed recovery and prevent re-injury in the future due to weakened muscles. Contact your doctor or a physical therapist for more information on knee  rehabilitation.   IF YOU ARE TRANSFERRED TO A SKILLED REHAB FACILITY If the patient is transferred to a skilled rehab facility following release from the hospital, a list of the current medications will be sent to the facility for  the patient to continue.  When discharged from the skilled rehab facility, please have the facility set up the patient's [place] prior to being released. Also, the skilled facility will be responsible for providing the patient with their medications at time of release from the facility to include their pain medication, the muscle relaxants, and their blood thinner medication. If the patient is still at the rehab facility at time of the two week follow up appointment, the skilled rehab facility will also need to assist the patient in arranging follow up appointment in our office and any transportation needs.  MAKE SURE YOU:  Understand these instructions.  Get help right away if you are not doing well or get worse.    Pick up stool softner and laxative for home use following surgery while on pain medications. Do not submerge incision under water. Please use good hand washing techniques while changing dressing each day. May shower starting three days after surgery. Please use a clean towel to pat the incision dry following showers. Continue to use ice for pain and swelling after surgery. Do not use any lotions or creams on the incision until instructed by your surgeon.   Increase activity slowly as tolerated   Complete by: As directed      Allergies as of 07/30/2019      Reactions   Crestor [rosuvastatin] Other (See Comments)   Myalgia on 5 mg per day   Erythromycin Nausea And Vomiting   Oral  No hives rash or itching   Mobic [meloxicam] Other (See Comments)   Moody and irritable       Medication List    STOP taking these medications   calcium-vitamin D 500-200 MG-UNIT tablet Commonly known as: OSCAL WITH D   HYDROcodone-acetaminophen  5-325 MG tablet Commonly known as: NORCO/VICODIN   Magnesium 200 MG Tabs   MULTIPLE VITAMIN PO     TAKE these medications   cetirizine 10 MG tablet Commonly known as: ZYRTEC Take 10 mg by mouth daily as needed for allergies.   clonazePAM 0.5 MG tablet Commonly known as: KLONOPIN TAKE 1 OR 2 TABLETS BY MOUTH AT BEDTIME What changed:   how much to take  how to take this  when to take this  additional instructions   DULoxetine 60 MG capsule Commonly known as: CYMBALTA TAKE ONE CAPSULE BY MOUTH TWICE DAILY   ezetimibe 10 MG tablet Commonly known as: ZETIA TAKE ONE TABLET BY MOUTH ONE TIME DAILY   fluticasone 50 MCG/ACT nasal spray Commonly known as: FLONASE Place 2 sprays into both nostrils daily.   gabapentin 300 MG capsule Commonly known as: NEURONTIN Take 1 capsule (300 mg total) by mouth 3 (three) times daily. for two weeks, Then a 300 mg capsule twice a day for two weeks. Then a 300 mg capsule once a day for two weeks. then discontinue the Gabapentin. What changed:   when to take this  additional instructions   losartan 50 MG tablet Commonly known as: COZAAR TAKE ONE TABLET BY MOUTH ONE TIME DAILY   methocarbamol 500 MG tablet Commonly known as: ROBAXIN Take 1 tablet (500 mg total) by mouth every 6 (six) hours as needed for muscle spasms.   montelukast 10 MG tablet Commonly known as: SINGULAIR TAKE ONE TABLET BY MOUTH AT BEDTIME   oxyCODONE 5 MG immediate release tablet Commonly known as: Oxy IR/ROXICODONE Take 1-2 tablets (5-10 mg total) by mouth every 6 (six) hours as needed for moderate pain or severe  pain (pain score 4-6).   polyethylene glycol powder 17 GM/SCOOP powder Commonly known as: GLYCOLAX/MIRALAX 1 cap full in a full glass of water, two times a day for 3 days.   risedronate 35 MG tablet Commonly known as: ACTONEL TAKE ONE TABLET BY MOUTH WEEKLY ON EMPTY STOMACH WITH WATER *DO NOT EAT/DRINK/ LIE DOWN FOR 30 MIN AFTER* What changed:  See the new instructions.   rivaroxaban 10 MG Tabs tablet Commonly known as: XARELTO Take 1 tablet (10 mg total) by mouth daily.   senna-docusate 8.6-50 MG tablet Commonly known as: Senokot-S Take 2 tablets by mouth 2 (two) times daily.      Follow-up Information    Gaynelle Arabian, MD. Schedule an appointment as soon as possible for a visit in 2 weeks.   Specialty: Orthopedic Surgery Contact information: [address] [place] [address] [phone]           Signed: Ardeen Jourdain, PA-C Orthopaedic Surgery 07/31/2019, 12:16 PM

## 2019-08-02 DIAGNOSIS — R2689 Other abnormalities of gait and mobility: Secondary | ICD-10-CM | POA: Diagnosis not present

## 2019-08-02 DIAGNOSIS — M25561 Pain in right knee: Secondary | ICD-10-CM | POA: Diagnosis not present

## 2019-08-02 DIAGNOSIS — Z96651 Presence of right artificial knee joint: Secondary | ICD-10-CM | POA: Diagnosis not present

## 2019-08-05 DIAGNOSIS — B370 Candidal stomatitis: Secondary | ICD-10-CM | POA: Diagnosis not present

## 2019-08-05 DIAGNOSIS — R2689 Other abnormalities of gait and mobility: Secondary | ICD-10-CM | POA: Diagnosis not present

## 2019-08-05 DIAGNOSIS — M25561 Pain in right knee: Secondary | ICD-10-CM | POA: Diagnosis not present

## 2019-08-05 DIAGNOSIS — Z96651 Presence of right artificial knee joint: Secondary | ICD-10-CM | POA: Diagnosis not present

## 2019-08-07 ENCOUNTER — Telehealth: Payer: Self-pay | Admitting: Internal Medicine

## 2019-08-07 DIAGNOSIS — Z96651 Presence of right artificial knee joint: Secondary | ICD-10-CM | POA: Diagnosis not present

## 2019-08-07 DIAGNOSIS — R2689 Other abnormalities of gait and mobility: Secondary | ICD-10-CM | POA: Diagnosis not present

## 2019-08-07 DIAGNOSIS — M25561 Pain in right knee: Secondary | ICD-10-CM | POA: Diagnosis not present

## 2019-08-07 NOTE — Chronic Care Management (AMB) (Signed)
  Chronic Care Management   Note  08/07/2019 Name: Julie Bonilla MRN: [id number] DOB: 08-May-1941  Julie Bonilla is a 79 y.o. year old female who is a primary care patient of Panosh, Standley Brooking, MD. I reached out to [name] by phone today in response to a referral sent by Julie Bonilla's PCP, Panosh, Standley Brooking, MD.   Julie Bonilla was given information about Chronic Care Management services today including:  1. CCM service includes personalized support from designated clinical staff supervised by her physician, including individualized plan of care and coordination with other care providers 2. 24/7 contact phone numbers for assistance for urgent and routine care needs. 3. Service will only be billed when office clinical staff spend 20 minutes or more in a month to coordinate care. 4. Only one practitioner may furnish and bill the service in a calendar month. 5. The patient may stop CCM services at any time (effective at the end of the month) by phone call to the office staff. 6. The patient will be responsible for cost sharing (co-pay) of up to 20% of the service fee (after annual deductible is met).  Patient agreed to services and verbal consent obtained.   Follow up plan:   Raynicia Dukes [employer] Scheduler

## 2019-08-09 DIAGNOSIS — R2689 Other abnormalities of gait and mobility: Secondary | ICD-10-CM | POA: Diagnosis not present

## 2019-08-09 DIAGNOSIS — Z96651 Presence of right artificial knee joint: Secondary | ICD-10-CM | POA: Diagnosis not present

## 2019-08-09 DIAGNOSIS — M25561 Pain in right knee: Secondary | ICD-10-CM | POA: Diagnosis not present

## 2019-08-12 DIAGNOSIS — M25561 Pain in right knee: Secondary | ICD-10-CM | POA: Diagnosis not present

## 2019-08-12 DIAGNOSIS — Z96651 Presence of right artificial knee joint: Secondary | ICD-10-CM | POA: Diagnosis not present

## 2019-08-12 DIAGNOSIS — R2689 Other abnormalities of gait and mobility: Secondary | ICD-10-CM | POA: Diagnosis not present

## 2019-08-13 ENCOUNTER — Telehealth: Payer: Self-pay | Admitting: Internal Medicine

## 2019-08-13 NOTE — Telephone Encounter (Signed)
Pt had knee replacement on 07/29/19 and on 08/03/19 she went to an urgent care and was diagnosed with thrush. Pt is having loose bowel movements for 3 weeks and is not sure if she needs to be seen in the office? Thanks
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
## 2019-08-13 NOTE — Telephone Encounter (Signed)
Scheduled for virtual visit tomorrow

## 2019-08-14 ENCOUNTER — Telehealth: Payer: PPO | Admitting: Internal Medicine

## 2019-08-14 DIAGNOSIS — M25561 Pain in right knee: Secondary | ICD-10-CM | POA: Diagnosis not present

## 2019-08-14 DIAGNOSIS — R2689 Other abnormalities of gait and mobility: Secondary | ICD-10-CM | POA: Diagnosis not present

## 2019-08-14 DIAGNOSIS — Z96651 Presence of right artificial knee joint: Secondary | ICD-10-CM | POA: Diagnosis not present

## 2019-08-16 DIAGNOSIS — R2689 Other abnormalities of gait and mobility: Secondary | ICD-10-CM | POA: Diagnosis not present

## 2019-08-16 DIAGNOSIS — M25561 Pain in right knee: Secondary | ICD-10-CM | POA: Diagnosis not present

## 2019-08-16 DIAGNOSIS — Z96651 Presence of right artificial knee joint: Secondary | ICD-10-CM | POA: Diagnosis not present

## 2019-08-20 ENCOUNTER — Telehealth: Payer: Self-pay

## 2019-08-20 DIAGNOSIS — I10 Essential (primary) hypertension: Secondary | ICD-10-CM

## 2019-08-20 DIAGNOSIS — M25569 Pain in unspecified knee: Secondary | ICD-10-CM

## 2019-08-20 DIAGNOSIS — J479 Bronchiectasis, uncomplicated: Secondary | ICD-10-CM

## 2019-08-20 NOTE — Telephone Encounter (Signed)
Okay for referral?

## 2019-08-20 NOTE — Telephone Encounter (Signed)
I am requesting an ambulatory referral to CCM be placed for this patient. Referral will need to have 2 current diagnosis attached to it. Thank you!  

## 2019-08-20 NOTE — Telephone Encounter (Signed)
Referral has been placed. 

## 2019-08-20 NOTE — Telephone Encounter (Signed)
Ok for referral  HLD  bronchiectesis knee arthritis

## 2019-08-20 NOTE — Chronic Care Management (AMB) (Signed)
Chronic Care Management Pharmacy  Name: Julie Bonilla MRN: [id number] DOB: [birth year]  Initial Questions: 1. Have you seen any other providers since your last visit? NA 2. Any changes in your medicines or health? No    Chief Complaint/ HPI Julie Bonilla,  79 y.o. , female presents for their Initial CCM visit with the clinical pharmacist via telephone due to COVID-19 Pandemic.  PCP : Julie Medin, MD  Their chronic conditions include: HTN, HLD, Insomnia, Chronic sinusitis/allergies, GERD, Nerve pain, Osteoporosis, Osteoarthritis, Constipation, TKA   Office Visits: 07/19/2019- Patient presented via virtual visit with Dr. Shanon Ace, MD for acute thoracic back pain. Patient obtained CT and labs and presented to ED for this. Patient notes getting better. Patient to undergo TKA on 07/29/2019.   05/15/2019- Patient presented to Dr. Tana Conch, MD for surgical clearance for right TKA surgery. BP, bronchiectasis, neuropathy have been stable. Patient to obtain BMET, lipid panel, hepatic function panel, A1c, CBC, and POCT urinalysis dipstick. No high risk conditions; patient cleared for surgery. Patient advised to restart statin medication 3x/week instead of daily intake for better LDL control and to continue taking Zetia.   Consult Visit: 08/05/2019- Patient presented to Dr. Lurlean Leyden, DO for oral thrush. Patient reported to recent use of antibiotics. Patient to start nystatin 100,000units/ml, 5 ml's four times daily for 10 days.   Medications: Outpatient Encounter Medications as of 08/21/2019  Medication Sig  . cetirizine (ZYRTEC) 10 MG tablet Take 10 mg by mouth daily as needed for allergies.   . clonazePAM (KLONOPIN) 0.5 MG tablet TAKE 1 OR 2 TABLETS BY MOUTH AT BEDTIME (Patient taking differently: Take 0.5-1 mg by mouth at bedtime. )  . DULoxetine (CYMBALTA) 60 MG capsule TAKE ONE CAPSULE BY MOUTH TWICE DAILY  (Patient taking differently: Take 60 mg by mouth 2 (two) times  daily. )  . ezetimibe (ZETIA) 10 MG tablet TAKE ONE TABLET BY MOUTH ONE TIME DAILY  (Patient taking differently: Take 10 mg by mouth daily. )  . fluticasone (FLONASE) 50 MCG/ACT nasal spray Place 2 sprays into both nostrils daily. (Patient not taking: Reported on 07/19/2019)  . gabapentin (NEURONTIN) 300 MG capsule Take 1 capsule (300 mg total) by mouth 3 (three) times daily. for two weeks, Then a 300 mg capsule twice a day for two weeks. Then a 300 mg capsule once a day for two weeks. then discontinue the Gabapentin.  Marland Kitchen losartan (COZAAR) 50 MG tablet TAKE ONE TABLET BY MOUTH ONE TIME DAILY  (Patient taking differently: Take 50 mg by mouth daily. )  . methocarbamol (ROBAXIN) 500 MG tablet Take 1 tablet (500 mg total) by mouth every 6 (six) hours as needed for muscle spasms.  . montelukast (SINGULAIR) 10 MG tablet TAKE ONE TABLET BY MOUTH AT BEDTIME  (Patient not taking: Reported on 07/19/2019)  . oxyCODONE (OXY IR/ROXICODONE) 5 MG immediate release tablet Take 1-2 tablets (5-10 mg total) by mouth every 6 (six) hours as needed for moderate pain or severe pain (pain score 4-6).  Marland Kitchen polyethylene glycol powder (GLYCOLAX/MIRALAX) 17 GM/SCOOP powder 1 cap full in a full glass of water, two times a day for 3 days. (Patient not taking: Reported on 07/19/2019)  . risedronate (ACTONEL) 35 MG tablet TAKE ONE TABLET BY MOUTH WEEKLY ON EMPTY STOMACH WITH WATER *DO NOT EAT/DRINK/ LIE DOWN FOR 30 MIN AFTER* (Patient taking differently: Take 35 mg by mouth every 7 (seven) days. )  . rivaroxaban (XARELTO) 10 MG TABS tablet Take 1  tablet (10 mg total) by mouth daily.  Marland Kitchen senna-docusate (SENOKOT-S) 8.6-50 MG tablet Take 2 tablets by mouth 2 (two) times daily. (Patient not taking: Reported on 07/19/2019)   No facility-administered encounter medications on file as of 08/21/2019.    Current Diagnosis/Assessment:  Goals Addressed   None     Hypertension   BP today is:  <140/90  Office blood pressures are  BP Readings  from Last 3 Encounters:  07/30/19 (!) 142/77  07/24/19 (!) 153/92  07/18/19 (!) 159/86   Patient has failed these meds in the past: none  Patient checks BP at home several times per month  Patient home BP readings are ranging: 130/70 mmHg  Patient is controlled on:  - losartan 50mg ,1 tablet daily   We discussed diet and exercise extensively . Discussed need to continue checking blood pressure at home.  . Discussed diet modifications. DASH diet: following a diet emphasizing fruits and vegetables and low-fat dairy products along with whole grains, fish, poultry, and nuts. Reducing red meats and sugars.  . Exercising  . Reducing the amount of salt intake to 1500mg /per day.  . Recommend using a salt substitute to replace your salt if you need flavor.     . Weight reduction- We discussed losing 5-10% of body weight Plan Continue current medications and control with diet and exercise.    Hyperlipidemia   Lipid Panel     Component Value Date/Time   CHOL 262 (H) 05/15/2019 1551   TRIG 287.0 (H) 05/15/2019 1551   HDL 63.60 05/15/2019 1551   CHOLHDL 4 05/15/2019 1551   VLDL 57.4 (H) 05/15/2019 1551   LDLCALC 152 (H) 11/01/2017 0954   LDLDIRECT 149.0 05/15/2019 1551    The 10-year ASCVD risk score Mikey Bussing DC Jr., et al., 2013) is: 43.7%   Values used to calculate the score:     Age: 49 years     Sex: Female     Is Non-Hispanic African American: No     Diabetic: No     Tobacco smoker: No     Systolic Blood Pressure: 0000000 mmHg     Is BP treated: Yes     HDL Cholesterol: 63.6 mg/dL     Total Cholesterol: 262 mg/dL   Patient has failed these meds in past: pravastatin, rosuvastatin (statin intolerance)  Patient is currently uncontrolled on the following medications:  Zetia 10mg , 1 tablet daily    We discussed:  diet and exercise extensively  . How to reduce cholesterol through diet/weight management and physical activity.    . We discussed how a diet high in plant sterols  (fruits/vegetables/nuts/whole grains/legumes) may reduce your cholesterol.  Encouraged increasing fiber to a daily intake of 10-25g/day   Plan Patient reported statin intolerance.  Per last result note, Dr. Regis Bill inquired if patient wanted to try statin 3x per week for better lipid control.  Discussed with patient; patient reported she is opening in trying statins 3x per week. Consulted with Dr. Regis Bill on intiating statin 3x per week. Per discussion with Dr. Regis Bill, patient to start Crestor 5mg , 1 tablet three days per week.     Insomnia   Patient has failed these meds in past: none  Patient is currently controlled on the following medications:  - clonazepam 0.5mg , 1 to 2 tablets at bedtime (patient reports taking 2 tablets at night)  We discussed:  non-pharmacological interventions for insomnia. (Avoid napping, limit exposure to technology near bedtime, etc)  Plan  Patient reports no sleep improvement. Patient  inquired on Ambien 10mg  (sister takes it).  Consulted with Dr. Regis Bill. Patient to make appointment with Dr. Regis Bill for further discussion. Patient also taking clonazepam for restless legs as well.   Chronic sinusitis/ allergies   Patient has failed these meds in past: Claritin  Patient is currently controlled on the following medications:  - cetirizine 10mg , 1 tablet daily as needed  - fluticasone nasal spray (72mcg/ actuation), 2 sprays into both nostrils daily  - Mucinex 400mg , 1 tablet twice daily   Plan Continue current medications  GERD   Patient has failed these meds in past:  Patient is currently controlled on the following medications:  - omeprazole 40 mg, 1 capsule daily (only when symptomatic)    We discussed:  non-pharmacological interventions for acid reflux. Take measures to prevent acid reflux, such as avoiding spicy foods, avoiding caffeine, avoid laying down a few hours after eating, and raising the head of the bed.  Plan  Continue current medications.    Nerve pain   Patient has failed these meds in past: gabapentin Patient is currently controlled on the following medications:  - duloxetine 60 mg, 1 capsule twice daily  - gabapentin 300 mg, 1 capsule twice daily (increased on her own to three times daily currently, but is planning to decrease once pain subsides- recently underwent TKA)    Plan Continue current medications  Osteoporosis   Patient has failed these meds in past: none  Patient is currently controlled on the following medications:  - risedronate 35mg , 1 tablet weekly - citracal/ vitamin D3 660/25mg , 2 tabs daily   Plan Continue current medications.   Osteoarthritis   Patient has failed these meds in past: hydrocodone/ APAP, meloxicam,  Patient is currently controlled on the following medications:  - oxycodone 5mg , 1 to 2 tablets every six hours as needed for moderate pain or severe pain  - methocarbamol 500mg , 1 tablet every six hours as needed (rarely takes) - diclofenac 1%, uses BID for arthritis pain   Plan Continue current medications.    Constipation   Patient is currently controlled on the following medications:  - senna-docusate 8.6-50mg , 2 tablets twice daily (only when needed)   - Metamucil (only when needed)   Plan Continue current medications  TKA (dvt prophylaxis)  Patient has failed these meds in past: Xarelto (given for short term course) Patient is currently managed on the following medications:  - ASA 81mg , 1 tablet daily (for next 2 weeks)   Plan Continue current medications  Medication Management  Patient organizes medications: uses pill box.  Barriers: denies issues obtaining medications from pharmacy Adherence: risedronate last filled 07/08/2019 for 30 DS (patient reports taking as directed)   Follow up Follow up visit with PharmD in 6 months. Will conduct general telephone calls for periodic check-ins before next visit.    Consulted with Dr. Regis Bill on intiating statin 3x per  week. Per discussion with Dr. Regis Bill, patient to start Crestor 5mg , 1 tablet three days per week.  Follow up call will be done in 2 weeks for reassessment.   Consulted with Dr. Regis Bill. Patient to make appointment with Dr. Regis Bill for further discussion. Patient also taking clonazepam for restless legs as well.    Verbal consent obtained for [facility] enhanced pharmacy services (medication synchronization, adherence packaging, delivery coordination). A medication sync plan was created to allow patient to get all medications delivered once every 30 to 90 days per patient preference. Patient understands they have freedom to choose pharmacy and clinical  pharmacist will coordinate care between all prescribers and [facility].    Anson Crofts, PharmD Clinical Pharmacist [facility] [place] [phone]

## 2019-08-21 ENCOUNTER — Ambulatory Visit: Payer: PPO

## 2019-08-21 ENCOUNTER — Other Ambulatory Visit: Payer: Self-pay

## 2019-08-21 ENCOUNTER — Telehealth: Payer: Self-pay

## 2019-08-21 DIAGNOSIS — Z96651 Presence of right artificial knee joint: Secondary | ICD-10-CM | POA: Diagnosis not present

## 2019-08-21 DIAGNOSIS — K219 Gastro-esophageal reflux disease without esophagitis: Secondary | ICD-10-CM

## 2019-08-21 DIAGNOSIS — J329 Chronic sinusitis, unspecified: Secondary | ICD-10-CM

## 2019-08-21 DIAGNOSIS — I10 Essential (primary) hypertension: Secondary | ICD-10-CM

## 2019-08-21 DIAGNOSIS — M25561 Pain in right knee: Secondary | ICD-10-CM | POA: Diagnosis not present

## 2019-08-21 DIAGNOSIS — R2689 Other abnormalities of gait and mobility: Secondary | ICD-10-CM | POA: Diagnosis not present

## 2019-08-21 DIAGNOSIS — E785 Hyperlipidemia, unspecified: Secondary | ICD-10-CM

## 2019-08-21 DIAGNOSIS — H6121 Impacted cerumen, right ear: Secondary | ICD-10-CM | POA: Diagnosis not present

## 2019-08-21 DIAGNOSIS — M810 Age-related osteoporosis without current pathological fracture: Secondary | ICD-10-CM

## 2019-08-21 NOTE — Telephone Encounter (Signed)
  Chronic Care Management   Outreach Note  08/21/2019 Name: Julie Bonilla MRN: [id number] DOB: [birth year]  Referred by: [name], MD Reason for referral : Chronic Care Management   An unsuccessful telephone outreach was attempted today. The patient was referred to the pharmacist for assistance with care management and care coordination.   Follow Up Plan:   Will attempt call back later this afternoon. If no call back, will attempt to reschedule initial visit in 1 to 2 weeks.   Anson Crofts, PharmD Clinical Pharmacist [facility] [place] [phone]

## 2019-08-23 DIAGNOSIS — R2689 Other abnormalities of gait and mobility: Secondary | ICD-10-CM | POA: Diagnosis not present

## 2019-08-23 DIAGNOSIS — Z96651 Presence of right artificial knee joint: Secondary | ICD-10-CM | POA: Diagnosis not present

## 2019-08-23 DIAGNOSIS — M25561 Pain in right knee: Secondary | ICD-10-CM | POA: Diagnosis not present

## 2019-08-23 NOTE — Telephone Encounter (Signed)
  Chronic Care Management   Outreach Note  08/23/2019 Name: Julie Bonilla MRN: [id number] DOB: Apr 11, 1941  Referred by: [name], MD Reason for referral : Chronic Care Management  Patient returned call on 08/21/19 and CCM visit was completed. See CCM encounter for notes.   Anson Crofts, PharmD Clinical Pharmacist [facility] [place] [phone]

## 2019-08-26 ENCOUNTER — Telehealth: Payer: Self-pay

## 2019-08-26 NOTE — Telephone Encounter (Signed)
Tillie Rung, can you change the patient's preferred pharmacy to [facility]?  And also send refills for the following to [facility] ? - ezetimibe 10mg . 1 tablet daily - duloxetine 60mg , 1 capsule twice daily - losartan 50mg , 1 tablet once daily  - risendronate 35mg , 1 tablet once weekly   This was discussed and approved by Dr. Regis Bill.  Let me know if you have any questions. Thanks!

## 2019-08-27 MED ORDER — LOSARTAN POTASSIUM 50 MG PO TABS: 50.0000 mg | ORAL_TABLET | Freq: Every day | ORAL | 0 refills | Status: DC

## 2019-08-27 MED ORDER — DULOXETINE HCL 60 MG PO CPEP: 60.0000 mg | ORAL_CAPSULE | Freq: Two times a day (BID) | ORAL | 0 refills | Status: DC

## 2019-08-27 MED ORDER — RIVAROXABAN 10 MG PO TABS: 10.0000 mg | ORAL_TABLET | Freq: Every day | ORAL | 0 refills | Status: DC

## 2019-08-27 MED ORDER — EZETIMIBE 10 MG PO TABS: 10.0000 mg | ORAL_TABLET | Freq: Every day | ORAL | 0 refills | Status: DC

## 2019-08-27 NOTE — Telephone Encounter (Signed)
All requested medication sent to pharmacy

## 2019-08-28 DIAGNOSIS — Z96651 Presence of right artificial knee joint: Secondary | ICD-10-CM | POA: Diagnosis not present

## 2019-08-28 DIAGNOSIS — R2689 Other abnormalities of gait and mobility: Secondary | ICD-10-CM | POA: Diagnosis not present

## 2019-08-28 DIAGNOSIS — M25561 Pain in right knee: Secondary | ICD-10-CM | POA: Diagnosis not present

## 2019-08-29 ENCOUNTER — Other Ambulatory Visit: Payer: Self-pay

## 2019-08-29 ENCOUNTER — Telehealth: Payer: Self-pay

## 2019-08-29 MED ORDER — RISEDRONATE SODIUM 35 MG PO TABS: ORAL_TABLET | ORAL | 0 refills | Status: DC

## 2019-08-29 NOTE — Telephone Encounter (Signed)
Not sure if this medication was left out from previous request?   Can you send in refill for risedronate (Actonel) 35mg  1 tablet once weekly for 3 months supply?  Thanks!

## 2019-08-29 NOTE — Telephone Encounter (Signed)
Done

## 2019-08-30 DIAGNOSIS — M25561 Pain in right knee: Secondary | ICD-10-CM | POA: Diagnosis not present

## 2019-08-30 DIAGNOSIS — R2689 Other abnormalities of gait and mobility: Secondary | ICD-10-CM | POA: Diagnosis not present

## 2019-08-30 DIAGNOSIS — Z96651 Presence of right artificial knee joint: Secondary | ICD-10-CM | POA: Diagnosis not present

## 2019-08-30 NOTE — Telephone Encounter (Signed)
Received message from pharmacy. Risedronate out of stock and will need to re-coordinate delivery.  Called patient and coordinated delivery of risedronate for Monday morning.

## 2019-09-03 DIAGNOSIS — Z471 Aftercare following joint replacement surgery: Secondary | ICD-10-CM | POA: Diagnosis not present

## 2019-09-03 DIAGNOSIS — Z96651 Presence of right artificial knee joint: Secondary | ICD-10-CM | POA: Diagnosis not present

## 2019-09-03 NOTE — Telephone Encounter (Signed)
Patient called back and would like Annette to call her back.   [phone]   Please advise

## 2019-09-03 NOTE — Telephone Encounter (Signed)
Returned patient's call. Patient wanted a cost comparison between [facility] and [facility].   Conducted cost comparison between [facility] and [facility] for ezetimibe, losartan, risedronate, duloxetine, gabapentin, clonazepam. Reviewed costs with patient.   Due to cheaper copays for ezetimibe, losartan, and  Risedronate with [facility], patient requested pharmacy to change back to [facility]. Updated patient's preferred pharmacy to [facility].

## 2019-09-04 MED ORDER — ROSUVASTATIN CALCIUM 5 MG PO TABS: ORAL_TABLET | ORAL | 3 refills | Status: DC

## 2019-09-04 NOTE — Progress Notes (Signed)
Med sent in to [facility]  Shanon Ace, MD

## 2019-09-04 NOTE — Patient Instructions (Signed)
Visit Information  Goals Addressed            This Visit's Progress   . Pharmacy Care Plan       CARE PLAN ENTRY  Current Barriers:  . Chronic Disease Management support, education, and care coordination needs related to HTN, HLD, and Insomnia, Chronic sinusitis/ allergies, GERD, Nerve pain, Osteoporosis, Osteoarthritis, Constipation, status post right knee replacement  Pharmacist Clinical Goal(s):  Marland Kitchen Work with the care management team to address educational, disease management, and care coordination needs.  . Call provider office for new or worsened signs and symptoms. . Over the next month, continue self health monitoring activities as directed today  . Call care management team with questions or concerns.  . Blood pressure:  Marland Kitchen Maintain blood pressure within goal of your provider (130/80 or 140/90)  . Maintain low salt diet.  . High cholesterol:  . Cholesterol goals: Total Cholesterol goal under 200, Triglycerides goal under 150, HDL goal above 40 (men) or above 50 (women), LDL goal under 100.  . Current cholesterol levels (05/15/2019) - Total cholesterol: 262 - Triglyceride: 287 - HDL: 63 - LDL: 149 . Insomnia: Continue to see improvement in insomnia. . Chronic sinusitis/ allergies: Improve symptoms associated with allergies.  . GERD/ heart burn: Minimize reflux symptoms.  . Nerve pain and osteoarthritis: Continue to see improvement in pain level . Osteoporosis: Continue: prevent bone fractures.  . Constipation: improve bowel movements.   Interventions: . Comprehensive medication review performed. . Collaboration with provider re: medication management . Evaluation of current treatment plans and patient's adherence to plan as established by provider . Assessed patient's education and care coordination needs . Provided disease specific education to patient . Blood pressure:  . Discussed need to continue checking blood pressure at home.  . Discussed diet modifications.  DASH diet:  following a diet emphasizing fruits and vegetables and low-fat dairy products along with whole grains, fish, poultry, and nuts. Reducing red meats and sugars.  . Exercising . Reducing the amount of salt intake to 1500mg /per day.  . Recommend using a salt substitute to replace your salt if you need flavor.     . Weight reduction- We discussed losing 5-10% of body weight . Continue:  losartan 50mg ,1 tablet daily . High cholesterol . How to reduce cholesterol through diet/weight management and physical activity.    . We discussed how a diet high in plant sterols (fruits/vegetables/nuts/whole grains/legumes) may reduce your cholesterol.  Encouraged increasing fiber to a daily intake of 10-25g/day  . Continue: Zetia 10mg , 1 tablet daily   . Consulted with Dr. Regis Bill. Start Crestor (rosuvastatin) 5mg , 1 tablet by mouth three days per week.  . Insomnia: Consulted with Dr. Regis Bill about alternative for clonazepam. Dr. Regis Bill requested you make an appointment with her for further discussion.  . Chronic sinusitis/ allergies . Continue:  - cetirizine (Zyrtec) 10mg , 1 tablet daily as needed  - fluticasone (Flonase) nasal spray (29mcg/ actuation), 2 sprays into both nostrils daily  - Mucinex 400mg , 1 tablet twice daily  . GERD/ acid reflux/ heart burn . Continue:  - omeprazole 40 mg, 1 capsule daily as directed  . Nerve pain . Continue:  - duloxetine 60 mg, 1 capsule twice daily  - gabapentin 300 mg, 1 capsule twice daily . Osteoporosis . Continue:  - risedronate 35mg , 1 tablet weekly - citracal/ vitamin D3 660/25mg , 2 tablets once daily  . Osteoarthritis . Continue:  - oxycodone 5mg , 1 to 2 tablets every six hours as needed  for moderate pain or severe pain  - methocarbamol 500mg , 1 tablet every six hours as needed  - -diclofenac 1%, uses BID for arthritis pain  . Constipation . Continue:  - senna-docusate 8.6-50mg , 2 tablets twice daily (only when needed)   - Metamucil (only when  needed)  . Status post knee replacement . Continue:  - Aspirin 81mg , 1 tablet once daily as directed  Patient Self Care Activities:  . Self administers medications as prescribed and Calls provider office for new concerns or questions . Continue current medications as directed by providers.  . Continue following up with primary care provider and/or specialists. . Continue at home blood pressure readings. . Continue working on health habits (diet/ exercise).  Initial goal documentation        Julie Bonilla was given information about Chronic Care Management services today including:  1. CCM service includes personalized support from designated clinical staff supervised by her physician, including individualized plan of care and coordination with other care providers 2. 24/7 contact phone numbers for assistance for urgent and routine care needs. 3. Standard insurance, coinsurance, copays and deductibles apply for chronic care management only during months in which we provide at least 20 minutes of these services. Most insurances cover these services at 100%, however patients may be responsible for any copay, coinsurance and/or deductible if applicable. This service may help you avoid the need for more expensive face-to-face services. 4. Only one practitioner may furnish and bill the service in a calendar month. 5. The patient may stop CCM services at any time (effective at the end of the month) by phone call to the office staff.  Patient agreed to services and verbal consent obtained.   The patient verbalized understanding of instructions provided today and agreed to receive a mailed copy of patient instruction and/or educational materials.  Telephone follow up appointment with pharmacy team member scheduled for: 02/21/2020  Anson Crofts, PharmD Clinical Pharmacist [facility] [place] [phone]   [place] stands for "Dietary Approaches to Stop  Hypertension." The DASH eating plan is a healthy eating plan that has been shown to reduce high blood pressure (hypertension). It may also reduce your risk for type 2 diabetes, heart disease, and stroke. The DASH eating plan may also help with weight loss. What are tips for following this plan?  General guidelines  Avoid eating more than 2,300 mg (milligrams) of salt (sodium) a day. If you have hypertension, you may need to reduce your sodium intake to 1,500 mg a day.  Limit alcohol intake to no more than 1 drink a day for nonpregnant women and 2 drinks a day for men. One drink equals 12 oz of beer, 5 oz of wine, or 1 oz of hard liquor.  Work with your health care provider to maintain a healthy body weight or to lose weight. Ask what an ideal weight is for you.  Get at least 30 minutes of exercise that causes your heart to beat faster (aerobic exercise) most days of the week. Activities may include walking, swimming, or biking.  Work with your health care provider or diet and nutrition specialist (dietitian) to adjust your eating plan to your individual calorie needs. Reading food labels   Check food labels for the amount of sodium per serving. Choose foods with less than 5 percent of the Daily Value of sodium. Generally, foods with less than 300 mg of sodium per serving fit into this eating plan.  To find  whole grains, look for the word "whole" as the first word in the ingredient list. Shopping  Buy products labeled as "low-sodium" or "no salt added."  Buy fresh foods. Avoid canned foods and premade or frozen meals. Cooking  Avoid adding salt when cooking. Use salt-free seasonings or herbs instead of table salt or sea salt. Check with your health care provider or pharmacist before using salt substitutes.  Do not fry foods. Cook foods using healthy methods such as baking, boiling, grilling, and broiling instead.  Cook with heart-healthy oils, such as olive, canola, soybean, or  sunflower oil. Meal planning  Eat a balanced diet that includes: ? 5 or more servings of fruits and vegetables each day. At each meal, try to fill half of your plate with fruits and vegetables. ? Up to 6-8 servings of whole grains each day. ? Less than 6 oz of lean meat, poultry, or fish each day. A 3-oz serving of meat is about the same size as a deck of cards. One egg equals 1 oz. ? 2 servings of low-fat dairy each day. ? A serving of nuts, seeds, or beans 5 times each week. ? Heart-healthy fats. Healthy fats called Omega-3 fatty acids are found in foods such as flaxseeds and coldwater fish, like sardines, salmon, and mackerel.  Limit how much you eat of the following: ? Canned or prepackaged foods. ? Food that is high in trans fat, such as fried foods. ? Food that is high in saturated fat, such as fatty meat. ? Sweets, desserts, sugary drinks, and other foods with added sugar. ? Full-fat dairy products.  Do not salt foods before eating.  Try to eat at least 2 vegetarian meals each week.  Eat more home-cooked food and less restaurant, buffet, and fast food.  When eating at a restaurant, ask that your food be prepared with less salt or no salt, if possible. What foods are recommended? The items listed may not be a complete list. Talk with your dietitian about what dietary choices are best for you. Grains Whole-grain or whole-wheat bread. Whole-grain or whole-wheat pasta. Brown rice. [place]. Bulgur. Whole-grain and low-sodium cereals. Pita bread. Low-fat, low-sodium crackers. Whole-wheat flour tortillas. Vegetables Fresh or frozen vegetables (raw, steamed, roasted, or grilled). Low-sodium or reduced-sodium tomato and vegetable juice. Low-sodium or reduced-sodium tomato sauce and tomato paste. Low-sodium or reduced-sodium canned vegetables. Fruits All fresh, dried, or frozen fruit. Canned fruit in natural juice (without added sugar). Meat and other protein foods Skinless  chicken or [place]. Ground chicken or [place]. Pork with fat trimmed off. Fish and seafood. Egg whites. Dried beans, peas, or lentils. Unsalted nuts, nut butters, and seeds. Unsalted canned beans. Lean cuts of beef with fat trimmed off. Low-sodium, lean deli meat. Dairy Low-fat (1%) or fat-free (skim) milk. Fat-free, low-fat, or reduced-fat cheeses. Nonfat, low-sodium ricotta or cottage cheese. Low-fat or nonfat yogurt. Low-fat, low-sodium cheese. Fats and oils Soft margarine without trans fats. Vegetable oil. Low-fat, reduced-fat, or light mayonnaise and salad dressings (reduced-sodium). Canola, safflower, olive, soybean, and sunflower oils. Avocado. Seasoning and other foods Herbs. Spices. Seasoning mixes without salt. Unsalted popcorn and pretzels. Fat-free sweets. What foods are not recommended? The items listed may not be a complete list. Talk with your dietitian about what dietary choices are best for you. Grains Baked goods made with fat, such as croissants, muffins, or some breads. Dry pasta or rice meal packs. Vegetables Creamed or fried vegetables. Vegetables in a cheese sauce. Regular canned vegetables (not low-sodium or  reduced-sodium). Regular canned tomato sauce and paste (not low-sodium or reduced-sodium). Regular tomato and vegetable juice (not low-sodium or reduced-sodium). Angie Fava. Olives. Fruits Canned fruit in a light or heavy syrup. Fried fruit. Fruit in cream or butter sauce. Meat and other protein foods Fatty cuts of meat. Ribs. Fried meat. Berniece Salines. Sausage. Bologna and other processed lunch meats. Salami. Fatback. Hotdogs. Bratwurst. Salted nuts and seeds. Canned beans with added salt. Canned or smoked fish. Whole eggs or egg yolks. Chicken or [place] with skin. Dairy Whole or 2% milk, cream, and half-and-half. Whole or full-fat cream cheese. Whole-fat or sweetened yogurt. Full-fat cheese. Nondairy creamers. Whipped toppings. Processed cheese and cheese spreads. Fats and  oils Butter. Stick margarine. Lard. Shortening. Ghee. Bacon fat. Tropical oils, such as coconut, palm kernel, or palm oil. Seasoning and other foods Salted popcorn and pretzels. Onion salt, garlic salt, seasoned salt, table salt, and sea salt. Worcestershire sauce. Tartar sauce. Barbecue sauce. Teriyaki sauce. Soy sauce, including reduced-sodium. Steak sauce. Canned and packaged gravies. Fish sauce. Oyster sauce. Cocktail sauce. Horseradish that you find on the shelf. Ketchup. Mustard. Meat flavorings and tenderizers. Bouillon cubes. Hot sauce and Tabasco sauce. Premade or packaged marinades. Premade or packaged taco seasonings. Relishes. Regular salad dressings. Where to find more information:  National Heart, Lung, and [place]: https://wilson-eaton.com/  American Heart Association: www.heart.org Summary  The DASH eating plan is a healthy eating plan that has been shown to reduce high blood pressure (hypertension). It may also reduce your risk for type 2 diabetes, heart disease, and stroke.  With the DASH eating plan, you should limit salt (sodium) intake to 2,300 mg a day. If you have hypertension, you may need to reduce your sodium intake to 1,500 mg a day.  When on the DASH eating plan, aim to eat more fresh fruits and vegetables, whole grains, lean proteins, low-fat dairy, and heart-healthy fats.  Work with your health care provider or diet and nutrition specialist (dietitian) to adjust your eating plan to your individual calorie needs. This information is not intended to replace advice given to you by your health care provider. Make sure you discuss any questions you have with your health care provider. Document Revised: 05/05/2017 Document Reviewed: 05/16/2016 Elsevier Patient Education  2020 [employer].

## 2019-09-04 NOTE — Addendum Note (Signed)
Addended byBurnis Medin on: 09/04/2019 06:34 PM   Modules accepted: Orders

## 2019-09-08 NOTE — Telephone Encounter (Signed)
Please send in  90 days refill x 3  As requested  risendronate

## 2019-09-09 ENCOUNTER — Other Ambulatory Visit: Payer: Self-pay

## 2019-09-09 ENCOUNTER — Telehealth: Payer: Self-pay | Admitting: Internal Medicine

## 2019-09-09 MED ORDER — EZETIMIBE 10 MG PO TABS: 10.0000 mg | ORAL_TABLET | Freq: Every day | ORAL | 2 refills | Status: DC

## 2019-09-09 MED ORDER — RISEDRONATE SODIUM 35 MG PO TABS: ORAL_TABLET | ORAL | 5 refills | Status: DC

## 2019-09-09 MED ORDER — LOSARTAN POTASSIUM 50 MG PO TABS: 50.0000 mg | ORAL_TABLET | Freq: Every day | ORAL | 2 refills | Status: DC

## 2019-09-09 NOTE — Telephone Encounter (Signed)
Can you look into this one? Patient states she is taking TID but per instructions on most recent Gabapentin Rx it says to discontinue after specific instructions? Please advise.

## 2019-09-09 NOTE — Telephone Encounter (Signed)
Pt call and need a refill on gabapentin (NEURONTIN) 300 MG capsule pt stated she is taken 3 cap a day. Sent to  [facility] # [place], [place] Phone:  [phone]  Fax:  [phone]

## 2019-09-09 NOTE — Telephone Encounter (Signed)
Medications have been sent to [place].

## 2019-09-16 NOTE — Telephone Encounter (Signed)
These orders for wean of bgabapentin were   Given by another provider so not sure  About refill '   Please contact patient  And see if can get clarification

## 2019-09-16 NOTE — Telephone Encounter (Signed)
Called patient and LMOVM to return call  Left a detailed message for patient to call back to get more detailed information.

## 2019-09-17 NOTE — Telephone Encounter (Signed)
Patient called to speak with the nurse about changing her medication.   The patient is on the way to the gym and will be back around 12. I advised the patient that the nurse will be at lunch around that time and it will be after 1 when they call her back.

## 2019-09-18 NOTE — Telephone Encounter (Signed)
Called patient and she stated that she has been taking the 600 mg of Gabapentin and is having burning and sharp pains and she wanted to know if she could increase to Three a day to help calm down the burning and pain for her feet.   Please advise.  Patient has an appointment on Monday. She also wants to discuss at that time to change the Clonazepam as she states it is not helping her sleep and she did not know if Ambien would be a better option. Sending this part as FYI for Monday visit.

## 2019-09-18 NOTE — Telephone Encounter (Signed)
Please clarify  Ok to increase gabapentin  to 300 mg 3 x per day if that is the request  If not let me know   (confusion about the med list previously commented on )

## 2019-09-18 NOTE — Telephone Encounter (Signed)
Called patient and gave her the message. Patient verbalized an understanding and I let her know to bring her medications to her appointment on Monday so we can update her list. Patient verbalized an understanding.

## 2019-09-20 ENCOUNTER — Other Ambulatory Visit: Payer: Self-pay

## 2019-09-20 ENCOUNTER — Telehealth: Payer: Self-pay

## 2019-09-20 NOTE — Telephone Encounter (Signed)
I have collaborated with the care management provider regarding care management and care coordination activities outlined in this encounter and have reviewed this encounter including documentation in the note and care plan. I am certifying that I agree with the content of this note and encounter as supervising physician.  

## 2019-09-20 NOTE — Telephone Encounter (Signed)
Chronic Care Management   Follow Up Note  09/20/2019 Name: Julie Bonilla MRN: [id number] DOB: 1941/01/30  Referred by: [name], MD Reason for referral : Chronic Care Management   Julie Bonilla is a 79 y.o. year old female who is a primary care patient of Panosh, Standley Brooking, MD. The CCM team was consulted for assistance with chronic disease management and care coordination needs.    Review of patient status, including review of consultants reports, relevant laboratory and other test results, and collaboration with appropriate care team members and the patient's provider was performed as part of comprehensive patient evaluation and provision of chronic care management services.    SDOH (Social Determinants of Health) assessments performed: No See Care Plan activities for detailed interventions related to [facility])   Outpatient Encounter Medications as of 09/20/2019  Medication Sig  . cetirizine (ZYRTEC) 10 MG tablet Take 10 mg by mouth daily as needed for allergies.   . clonazePAM (KLONOPIN) 0.5 MG tablet TAKE 1 OR 2 TABLETS BY MOUTH AT BEDTIME (Patient taking differently: Take 0.5-1 mg by mouth at bedtime. )  . diclofenac Sodium (VOLTAREN) 1 % GEL diclofenac 1 % topical gel  Apply 4 g by topical route.  . DULoxetine (CYMBALTA) 60 MG capsule Take 1 capsule (60 mg total) by mouth 2 (two) times daily.  Marland Kitchen ezetimibe (ZETIA) 10 MG tablet Take 1 tablet (10 mg total) by mouth daily.  . fluticasone (FLONASE) 50 MCG/ACT nasal spray Place 2 sprays into both nostrils daily.  Marland Kitchen gabapentin (NEURONTIN) 300 MG capsule Take 1 capsule (300 mg total) by mouth 3 (three) times daily. for two weeks, Then a 300 mg capsule twice a day for two weeks. Then a 300 mg capsule once a day for two weeks. then discontinue the Gabapentin. (Patient taking differently: No sig reported)  . losartan (COZAAR) 50 MG tablet Take 1 tablet (50 mg total) by mouth daily.  . methocarbamol (ROBAXIN) 500 MG tablet Take 1 tablet (500  mg total) by mouth every 6 (six) hours as needed for muscle spasms.  . montelukast (SINGULAIR) 10 MG tablet TAKE ONE TABLET BY MOUTH AT BEDTIME  (Patient not taking: Reported on 07/19/2019)  . omeprazole (PRILOSEC OTC) 20 MG tablet Take 20 mg by mouth daily.  Marland Kitchen oxyCODONE (OXY IR/ROXICODONE) 5 MG immediate release tablet Take 1-2 tablets (5-10 mg total) by mouth every 6 (six) hours as needed for moderate pain or severe pain (pain score 4-6).  Marland Kitchen polyethylene glycol powder (GLYCOLAX/MIRALAX) 17 GM/SCOOP powder 1 cap full in a full glass of water, two times a day for 3 days.  . risedronate (ACTONEL) 35 MG tablet TAKE ONE TABLET BY MOUTH WEEKLY ON EMPTY STOMACH WITH WATER *DO NOT EAT/DRINK/ LIE DOWN FOR 30 MIN AFTER*  . rivaroxaban (XARELTO) 10 MG TABS tablet Take 1 tablet (10 mg total) by mouth daily.  . rosuvastatin (CRESTOR) 5 MG tablet 1 po 3 days per week.  . senna-docusate (SENOKOT-S) 8.6-50 MG tablet Take 2 tablets by mouth 2 (two) times daily.   No facility-administered encounter medications on file as of 09/20/2019.     Called patient and confirmed using 2 identifiers. Patient was started on Crestor 5mg , 1 tablet three days a week on 09/04/2019.  Patient stated overall she has been tolerating it well. She has been active and has some muscle soreness, but states she does not feel it is due to statin.  Patient inquired if she needs to continue ezetimibe. Counseled patient on  difference between both medications and to continue taking both medications.  Patient has follow up appointment with Dr. Regis Bill on 09/23/2019 for insomnia therapy reassessment.   Plan:   Follow up with provider re: insomnia therapy reassessment.   Telephone follow up visit: 02/21/2020  Anson Crofts, PharmD Clinical Pharmacist [facility] [place] [phone]
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
## 2019-09-23 ENCOUNTER — Other Ambulatory Visit: Payer: Self-pay

## 2019-09-23 ENCOUNTER — Encounter: Payer: Self-pay | Admitting: Internal Medicine

## 2019-09-23 ENCOUNTER — Ambulatory Visit (INDEPENDENT_AMBULATORY_CARE_PROVIDER_SITE_OTHER): Payer: PPO | Admitting: Internal Medicine

## 2019-09-23 VITALS — BP 134/82 | HR 78 | Temp 97.5°F | Ht 59.75 in | Wt 127.0 lb

## 2019-09-23 DIAGNOSIS — Z79899 Other long term (current) drug therapy: Secondary | ICD-10-CM | POA: Diagnosis not present

## 2019-09-23 DIAGNOSIS — Z96651 Presence of right artificial knee joint: Secondary | ICD-10-CM

## 2019-09-23 DIAGNOSIS — G4700 Insomnia, unspecified: Secondary | ICD-10-CM

## 2019-09-23 DIAGNOSIS — E785 Hyperlipidemia, unspecified: Secondary | ICD-10-CM | POA: Diagnosis not present

## 2019-09-23 DIAGNOSIS — G609 Hereditary and idiopathic neuropathy, unspecified: Secondary | ICD-10-CM

## 2019-09-23 MED ORDER — ZOLPIDEM TARTRATE 5 MG PO TABS: 5.0000 mg | ORAL_TABLET | Freq: Every evening | ORAL | 1 refills | Status: DC | PRN

## 2019-09-23 NOTE — Patient Instructions (Addendum)
Continue sleep hygiene  Can try ambien 5 mg   As needed and maybe 3 days in a row .   Stopping  Clonazepam for now and switch    Can try increasing the gabapentin to 600 mg at night also  ( one step at a time)  consider trying also  OTC melatonin 1+ hour before sleep time   Sometimes helps also   Again  Begin  Only one step at a time.   Get fasting ;lab in  3 months or so  Lipids and  Any other lab indicated .

## 2019-09-30 ENCOUNTER — Encounter: Payer: Self-pay | Admitting: Internal Medicine

## 2019-10-21 ENCOUNTER — Other Ambulatory Visit: Payer: Self-pay | Admitting: Internal Medicine

## 2019-10-24 ENCOUNTER — Telehealth: Payer: Self-pay | Admitting: Internal Medicine

## 2019-10-24 NOTE — Telephone Encounter (Signed)
The patient wanted to talk with Anne Ng, the pharmacist. I advised the patient that Anne Ng is out of thew office today and I will have her give her a call tomorrow.  Please advise

## 2019-10-25 ENCOUNTER — Telehealth: Payer: Self-pay

## 2019-10-25 NOTE — Telephone Encounter (Signed)
Returned patient's call. Patient wanted to let Dr. Regis Bill know of the following:    1) Patient reported having pain from knees and down and has stopped taking rosuvastatin. She reports stopping 10 days ago and still having pain.    2) Patient reports still having difficulty falling sleep.  Reports trying the following as discussed at last visit (not together):   - melatonin 10mg  (took 30 minutes before going to bed)  - gabapentin 600mg  at night (for about 3 nights)   She reports if she does not take zolpidem, she is wide awake until 3 to 4 am.  She reports taking zolpidem after laying in bed for about 1 hour.   After further discussion, patient reports watching TV or reading a book before going to bed.  Patient states she has never gotten to bed before 12 am due to work history and has now been a habit for her staying up late. But now she stays wide wake until 3 to 4 am and states affects her ADL the next day.   Patient inquired if she can take zolpidem, 1 tablet at bedtime every night instead of as needed. Patient reported taking only a couple of times over the past few weeks.     Dr. Regis Bill,   1.) Discussed with patient to continue without rosuvastatin.  What are your thoughts on getting a CK level? I see she has had several in the past.  And as well assess vitamin D and TSH.    2) For insomnia. we discussed risks vs benefits on using zolpidem on a daily basis. Please advise on daily use of zolpidem. What are your thoughts on using low dose trazodone? She is on Cymbalta and would need to monitor for serotonin syndrome.

## 2019-11-02 NOTE — Telephone Encounter (Signed)
I do not advise nor wishe to prescribe   regular ambien  However   Willing to try low dose trazodone Or belsomra ( cost issue) Or  Take ambien 2  -3 moghts in a row  Per week  And limit to 15 per month.  No need for the ck wouldn't change anything Ok to add vit D level to her next labs

## 2019-11-11 ENCOUNTER — Other Ambulatory Visit: Payer: Self-pay | Admitting: Internal Medicine

## 2019-11-11 NOTE — Telephone Encounter (Signed)
Last OV 09/23/2019  Last filled 09/23/2019, # 20 with 1 refill

## 2019-11-12 ENCOUNTER — Telehealth: Payer: Self-pay | Admitting: Internal Medicine

## 2019-11-12 NOTE — Telephone Encounter (Signed)
Pt is calling in stating that she is out and needs a refill on clonazepam 0.5 MG   Pharm:  [facility] in [place]

## 2019-11-12 NOTE — Telephone Encounter (Signed)
Last OV 09/23/2019  Last filled 05/14/2019, # 180 with 0 refills

## 2019-11-13 DIAGNOSIS — Z85828 Personal history of other malignant neoplasm of skin: Secondary | ICD-10-CM | POA: Diagnosis not present

## 2019-11-13 DIAGNOSIS — D0462 Carcinoma in situ of skin of left upper limb, including shoulder: Secondary | ICD-10-CM | POA: Diagnosis not present

## 2019-11-13 DIAGNOSIS — D101 Benign neoplasm of tongue: Secondary | ICD-10-CM | POA: Diagnosis not present

## 2019-11-13 DIAGNOSIS — L570 Actinic keratosis: Secondary | ICD-10-CM | POA: Diagnosis not present

## 2019-11-13 DIAGNOSIS — D485 Neoplasm of uncertain behavior of skin: Secondary | ICD-10-CM | POA: Diagnosis not present

## 2019-11-15 ENCOUNTER — Other Ambulatory Visit: Payer: Self-pay | Admitting: Internal Medicine

## 2019-11-15 NOTE — Telephone Encounter (Signed)
Last OV 09/23/2019  Last filled 07/30/2019, # 84 with 0 refill  Last instructions were to taper down and discontinue.  Please advise.

## 2019-11-15 NOTE — Telephone Encounter (Signed)
These were my last instructions   Please refoill medicatoin   aks if she has been able to decrease the clonazepam  And inc the gaba.    Continue sleep hygiene  Can try ambien 5 mg   As needed and maybe 3 days in a row .   Stopping  Clonazepam for now and switch    Can try increasing the gabapentin to 600 mg at night also  ( one step at a time)  consider trying also  OTC melatonin 1+ hour before sleep time   Sometimes helps also   Again  Begin  Only one step at a time.   Get fasting ;lab in  3 months or so  Lipids and  Any other lab indicated .

## 2019-11-15 NOTE — Telephone Encounter (Signed)
Called patient and spoke to [place] and she stated that the patient will call me back soon.

## 2019-11-15 NOTE — Telephone Encounter (Signed)
I am not able to refuse this medication.  Sending to you.

## 2019-11-18 ENCOUNTER — Telehealth: Payer: Self-pay

## 2019-11-18 ENCOUNTER — Other Ambulatory Visit: Payer: Self-pay

## 2019-11-18 NOTE — Telephone Encounter (Signed)
Called patient and she stated that she is still not sleeping well and asked if she would be able to alternate the clonazepam with the ambien. She is currently not taking the clonazepam. She is on the melatonin and has not noticed any improvement while taking this.  Patient also has a lab appt coming up in July and is asking if we can add labs to check her kidneys. She stated that she has 3 siblings that have CKD and she is really worried about this and wants her kidneys checked.  Please advise if want to add another lab to current orders.

## 2019-11-19 NOTE — Telephone Encounter (Signed)
So  This is confusing    We had talked about gabapentin and weaning the clonazepam  sibnce it didn't work very well  Please advise med and dosing she is actually taking and if it is helping her legs

## 2019-11-19 NOTE — Telephone Encounter (Signed)
We should pick one medication  And not both ambine or clonazepam   Both have issues  And risk      Does she want to try belsomra?  Instead of   ambien for sleep >  Will take a while to work .  But is approved for over 61 age group.

## 2019-11-20 NOTE — Telephone Encounter (Signed)
Called patient and LMOVM to return call  Hopefully patient will call back so I can go over the messages with her.

## 2019-11-21 NOTE — Telephone Encounter (Signed)
Patient returned Megan's call. I advised the patient that she was gone for today and that I can send a message to have her call the patient tomorrow when she is back in the office.

## 2019-11-22 NOTE — Telephone Encounter (Signed)
Called patient and she has not been using the Clonazepam.  Patient states that she sleeps well when she takes her Ambien and she is not able to take it everyday. Patient is asking what can she take on the nights she does not use Ambien because she will stay up until 3 or 4 pm and not able to sleep when not taking the Ambien? Please advise.

## 2019-11-22 NOTE — Telephone Encounter (Signed)
Patient stated that the Gabapentin is helping her and doing well.

## 2019-11-25 NOTE — Telephone Encounter (Signed)
Pt was seeing if Dr. Regis Bill had a moment to advise on this. Pt would like to be reached on her cell [phone]

## 2019-11-25 NOTE — Telephone Encounter (Signed)
Closing encounter.   Patient sent separate message and addressing directly with Dr. Regis Bill.

## 2019-11-25 NOTE — Telephone Encounter (Signed)
Called patient and LMOVM to return call  Left a detailed voice message to have patient schedule a virtual visit or telephone visit to go over all information to best advise patient on this.

## 2019-11-25 NOTE — Telephone Encounter (Signed)
So if she want to take ambien instead of clonazepam  We can try this  But  dont advise taking both ambien and clonazepam    Does she need to tdo a video visit or  telephone visit to work this out?

## 2019-12-04 ENCOUNTER — Other Ambulatory Visit: Payer: Self-pay

## 2019-12-04 ENCOUNTER — Ambulatory Visit (INDEPENDENT_AMBULATORY_CARE_PROVIDER_SITE_OTHER): Payer: PPO | Admitting: Internal Medicine

## 2019-12-04 ENCOUNTER — Encounter: Payer: Self-pay | Admitting: Internal Medicine

## 2019-12-04 VITALS — BP 142/86 | HR 66 | Temp 97.9°F | Ht 59.75 in | Wt 124.6 lb

## 2019-12-04 DIAGNOSIS — R3989 Other symptoms and signs involving the genitourinary system: Secondary | ICD-10-CM

## 2019-12-04 LAB — POCT URINALYSIS DIPSTICK
Bilirubin, UA: POSITIVE
Blood, UA: POSITIVE
Glucose, UA: NEGATIVE
Ketones, UA: NEGATIVE
Nitrite, UA: POSITIVE
Protein, UA: POSITIVE — AB
Spec Grav, UA: 1.025 (ref 1.010–1.025)
Urobilinogen, UA: 2 E.U./dL — AB
pH, UA: 5.5 (ref 5.0–8.0)

## 2019-12-04 MED ORDER — CEFDINIR 300 MG PO CAPS: 300.0000 mg | ORAL_CAPSULE | Freq: Two times a day (BID) | ORAL | 0 refills | Status: DC

## 2019-12-04 NOTE — Patient Instructions (Signed)
This seems like uti. Take antibiotic and will let you know when culture is back   Let us know if    persistent or progressive symptoms .
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
## 2019-12-05 ENCOUNTER — Other Ambulatory Visit: Payer: Self-pay

## 2019-12-05 NOTE — Telephone Encounter (Signed)
Patient called and stated that she started her antibiotic yesterday and she is feeling a little better.  Patient stated that she is willing to try the [place] and send to [facility] on [facility] in [place], [phone] near her home? She doesn't want to drive to [facility] today. She did not sleep well last night.  She is trying not to take the Ambien and wants to try something else.  Patient is wanting to go to the beach on Saturday and wants to make sure she is okay to go also and is hoping the urine culture will be back tomorrow.

## 2019-12-06 ENCOUNTER — Other Ambulatory Visit: Payer: Self-pay

## 2019-12-06 ENCOUNTER — Telehealth: Payer: Self-pay | Admitting: Internal Medicine

## 2019-12-06 LAB — URINE CULTURE
MICRO NUMBER:: 10654422
SPECIMEN QUALITY:: ADEQUATE

## 2019-12-06 MED ORDER — BELSOMRA 10 MG PO TABS: 10.0000 mg | ORAL_TABLET | Freq: Every evening | ORAL | 0 refills | Status: DC | PRN

## 2019-12-06 NOTE — Progress Notes (Signed)
Tell patient that urine culture shows e coli  sensitive to medication given . Should resolve with current treatment .FU if not better. I  see that she has a message about belsomra and will send that in  today alos but may need a PA  not sure

## 2019-12-06 NOTE — Telephone Encounter (Signed)
Called patient and gave her lab results and let her know that I have sent in the [place] to [facility] for her. Patient verbalized an understanding.

## 2019-12-06 NOTE — Telephone Encounter (Signed)
Will send in 10 mg  And may be able to increast to 15 mg if needed Please find the correct pharmacy she needs

## 2019-12-06 NOTE — Telephone Encounter (Signed)
Pt called in for lab results from 6/30 you can call back on her cell.

## 2019-12-06 NOTE — Telephone Encounter (Signed)
I have entered the correct pharmacy. Please send for patient. I sent by accident. Thank you!

## 2019-12-12 ENCOUNTER — Telehealth: Payer: Self-pay | Admitting: Internal Medicine

## 2019-12-12 NOTE — Telephone Encounter (Signed)
Left message for patient to schedule Annual Wellness Visit.  Please schedule with Nurse Health Advisor Shannon Crews, RN at [facility]  

## 2019-12-21 ENCOUNTER — Other Ambulatory Visit: Payer: Self-pay | Admitting: Internal Medicine

## 2019-12-30 ENCOUNTER — Other Ambulatory Visit: Payer: Self-pay

## 2019-12-30 ENCOUNTER — Other Ambulatory Visit: Payer: PPO

## 2019-12-30 DIAGNOSIS — Z79899 Other long term (current) drug therapy: Secondary | ICD-10-CM | POA: Diagnosis not present

## 2019-12-30 DIAGNOSIS — E785 Hyperlipidemia, unspecified: Secondary | ICD-10-CM

## 2019-12-30 DIAGNOSIS — G609 Hereditary and idiopathic neuropathy, unspecified: Secondary | ICD-10-CM

## 2019-12-30 DIAGNOSIS — R3989 Other symptoms and signs involving the genitourinary system: Secondary | ICD-10-CM

## 2019-12-30 LAB — LIPID PANEL
Cholesterol: 254 mg/dL — ABNORMAL HIGH (ref <200)
HDL: 63 mg/dL (ref >=50)
LDL Cholesterol (Calc): 152 mg/dL (calc) — ABNORMAL HIGH
Non-HDL Cholesterol (Calc): 191 mg/dL (calc) — ABNORMAL HIGH (ref <130)
Total CHOL/HDL Ratio: 4 (calc) (ref <5.0)
Triglycerides: 219 mg/dL — ABNORMAL HIGH (ref <150)

## 2019-12-30 LAB — CBC WITH DIFFERENTIAL/PLATELET
Absolute Monocytes: 442 cells/uL (ref 200–950)
Basophils Absolute: 62 cells/uL (ref 0–200)
Basophils Relative: 1.2 %
Eosinophils Absolute: 203 cells/uL (ref 15–500)
Eosinophils Relative: 3.9 %
HCT: 41.5 % (ref 35.0–45.0)
Hemoglobin: 13.8 g/dL (ref 11.7–15.5)
Lymphs Abs: 2111 cells/uL (ref 850–3900)
MCH: 31 pg (ref 27.0–33.0)
MCHC: 33.3 g/dL (ref 32.0–36.0)
MCV: 93.3 fL (ref 80.0–100.0)
MPV: 9.9 fL (ref 7.5–12.5)
Monocytes Relative: 8.5 %
Neutro Abs: 2382 cells/uL (ref 1500–7800)
Neutrophils Relative %: 45.8 %
Platelets: 277 10*3/uL (ref 140–400)
RBC: 4.45 10*6/uL (ref 3.80–5.10)
RDW: 13 % (ref 11.0–15.0)
Total Lymphocyte: 40.6 %
WBC: 5.2 10*3/uL (ref 3.8–10.8)

## 2019-12-30 LAB — HEPATIC FUNCTION PANEL
AG Ratio: 1.9 (calc) (ref 1.0–2.5)
ALT: 21 U/L (ref 6–29)
AST: 19 U/L (ref 10–35)
Albumin: 4.3 g/dL (ref 3.6–5.1)
Alkaline phosphatase (APISO): 63 U/L (ref 37–153)
Bilirubin, Direct: 0.1 mg/dL (ref 0.0–0.2)
Globulin: 2.3 g/dL (calc) (ref 1.9–3.7)
Indirect Bilirubin: 0.3 mg/dL (calc) (ref 0.2–1.2)
Total Bilirubin: 0.4 mg/dL (ref 0.2–1.2)
Total Protein: 6.6 g/dL (ref 6.1–8.1)

## 2019-12-30 LAB — BASIC METABOLIC PANEL
BUN: 15 mg/dL (ref 7–25)
CO2: 26 mmol/L (ref 20–32)
Calcium: 9.1 mg/dL (ref 8.6–10.4)
Chloride: 106 mmol/L (ref 98–110)
Creat: 0.84 mg/dL (ref 0.60–0.93)
Glucose, Bld: 79 mg/dL (ref 65–99)
Potassium: 4.2 mmol/L (ref 3.5–5.3)
Sodium: 142 mmol/L (ref 135–146)

## 2019-12-30 NOTE — Addendum Note (Signed)
Addended by: Marrion Coy on: 12/30/2019 10:03 AM   Modules accepted: Orders

## 2020-01-03 ENCOUNTER — Telehealth: Payer: Self-pay | Admitting: Internal Medicine

## 2020-01-03 NOTE — Telephone Encounter (Signed)
Called patient and she stated that she took 8 of the [place] and they do not work. She stated that she has an appointment on 01/24/20 and can discuss with you then and will call if worse next week.

## 2020-01-03 NOTE — Telephone Encounter (Signed)
Pt was returning call. Want's a call back at [phone]

## 2020-01-03 NOTE — Telephone Encounter (Signed)
Try taking 20 mg at night ( 2 of the 10 mg) I f you have any left

## 2020-01-06 NOTE — Telephone Encounter (Signed)
Called patient and gave her message from Dr. Regis Bill. Patient verbalized an understanding and will let us know how this works.

## 2020-01-08 ENCOUNTER — Telehealth: Payer: Self-pay | Admitting: Internal Medicine

## 2020-01-08 NOTE — Telephone Encounter (Signed)
Left message for patient to schedule Annual Wellness Visit.  Please schedule with Nurse Health Advisor Shannon Crews, RN at [facility]  

## 2020-01-08 NOTE — Telephone Encounter (Signed)
Pt called back and has been scheduled with La[facility]

## 2020-01-14 ENCOUNTER — Ambulatory Visit (INDEPENDENT_AMBULATORY_CARE_PROVIDER_SITE_OTHER): Payer: PPO

## 2020-01-14 ENCOUNTER — Other Ambulatory Visit: Payer: Self-pay

## 2020-01-14 DIAGNOSIS — Z780 Asymptomatic menopausal state: Secondary | ICD-10-CM | POA: Diagnosis not present

## 2020-01-14 DIAGNOSIS — Z0000 Encounter for general adult medical examination without abnormal findings: Secondary | ICD-10-CM

## 2020-01-14 NOTE — Patient Instructions (Addendum)
Julie Bonilla , Thank you for taking time to come for your Medicare Wellness Visit. I appreciate your ongoing commitment to your health goals. Please review the following plan we discussed and let me know if I can assist you in the future.   Screening recommendations/referrals: Colonoscopy: No longer required Mammogram: up to date, next due 79/21/2022 Bone Density: Currently due, orders placed this visit Recommended yearly ophthalmology/optometry visit for glaucoma screening and checkup Recommended yearly dental visit for hygiene and checkup  Vaccinations: Influenza vaccine: Up to date, next due this fall 2021 Pneumococcal vaccine: Completed series Tdap vaccine: Up to date, next due 08/07/2028 Shingles vaccine: Completed series    Advanced directives: Please bring a copy in to your next office visit so that we may scan it into your chart  Conditions/risks identified: None   Next appointment: 01/24/2020 with Dr. Regis Bill @ 1:30 PM   Preventive Care 79 Years and Older, Female Preventive care refers to lifestyle choices and visits with your health care provider that can promote health and wellness. What does preventive care include?  A yearly physical exam. This is also called an annual well check.  Dental exams once or twice a year.  Routine eye exams. Ask your health care provider how often you should have your eyes checked.  Personal lifestyle choices, including:  Daily care of your teeth and gums.  Regular physical activity.  Eating a healthy diet.  Avoiding tobacco and drug use.  Limiting alcohol use.  Practicing safe sex.  Taking low-dose aspirin every day.  Taking vitamin and mineral supplements as recommended by your health care provider. What happens during an annual well check? The services and screenings done by your health care provider during your annual well check will depend on your age, overall health, lifestyle risk factors, and family history of  disease. Counseling  Your health care provider may ask you questions about your:  Alcohol use.  Tobacco use.  Drug use.  Emotional well-being.  Home and relationship well-being.  Sexual activity.  Eating habits.  History of falls.  Memory and ability to understand (cognition).  Work and work Statistician.  Reproductive health. Screening  You may have the following tests or measurements:  Height, weight, and BMI.  Blood pressure.  Lipid and cholesterol levels. These may be checked every 5 years, or more frequently if you are over 21 years old.  Skin check.  Lung cancer screening. You may have this screening every year starting at age 32 if you have a 30-pack-year history of smoking and currently smoke or have quit within the past 15 years.  Fecal occult blood test (FOBT) of the stool. You may have this test every year starting at age 70.  Flexible sigmoidoscopy or colonoscopy. You may have a sigmoidoscopy every 5 years or a colonoscopy every 10 years starting at age 22.  Hepatitis C blood test.  Hepatitis B blood test.  Sexually transmitted disease (STD) testing.  Diabetes screening. This is done by checking your blood sugar (glucose) after you have not eaten for a while (fasting). You may have this done every 1-3 years.  Bone density scan. This is done to screen for osteoporosis. You may have this done starting at age 11.  Mammogram. This may be done every 1-2 years. Talk to your health care provider about how often you should have regular mammograms. Talk with your health care provider about your test results, treatment options, and if necessary, the need for more tests. Vaccines  Your health  care provider may recommend certain vaccines, such as:  Influenza vaccine. This is recommended every year.  Tetanus, diphtheria, and acellular pertussis (Tdap, Td) vaccine. You may need a Td booster every 10 years.  Zoster vaccine. You may need this after age  27.  Pneumococcal 13-valent conjugate (PCV13) vaccine. One dose is recommended after age 79.  Pneumococcal polysaccharide (PPSV23) vaccine. One dose is recommended after age 79. Talk to your health care provider about which screenings and vaccines you need and how often you need them. This information is not intended to replace advice given to you by your health care provider. Make sure you discuss any questions you have with your health care provider. Document Released: 06/19/2015 Document Revised: 02/10/2016 Document Reviewed: 03/24/2015 Elsevier Interactive Patient Education  2017 [place] Prevention in the Home Falls can cause injuries. They can happen to people of all ages. There are many things you can do to make your home safe and to help prevent falls. What can I do on the outside of my home?  Regularly fix the edges of walkways and driveways and fix any cracks.  Remove anything that might make you trip as you walk through a door, such as a raised step or threshold.  Trim any bushes or trees on the path to your home.  Use bright outdoor lighting.  Clear any walking paths of anything that might make someone trip, such as rocks or tools.  Regularly check to see if handrails are loose or broken. Make sure that both sides of any steps have handrails.  Any raised decks and porches should have guardrails on the edges.  Have any leaves, snow, or ice cleared regularly.  Use sand or salt on walking paths during winter.  Clean up any spills in your garage right away. This includes oil or grease spills. What can I do in the bathroom?  Use night lights.  Install grab bars by the toilet and in the tub and shower. Do not use towel bars as grab bars.  Use non-skid mats or decals in the tub or shower.  If you need to sit down in the shower, use a plastic, non-slip stool.  Keep the floor dry. Clean up any water that spills on the floor as soon as it happens.  Remove  soap buildup in the tub or shower regularly.  Attach bath mats securely with double-sided non-slip rug tape.  Do not have throw rugs and other things on the floor that can make you trip. What can I do in the bedroom?  Use night lights.  Make sure that you have a light by your bed that is easy to reach.  Do not use any sheets or blankets that are too big for your bed. They should not hang down onto the floor.  Have a firm chair that has side arms. You can use this for support while you get dressed.  Do not have throw rugs and other things on the floor that can make you trip. What can I do in the kitchen?  Clean up any spills right away.  Avoid walking on wet floors.  Keep items that you use a lot in easy-to-reach places.  If you need to reach something above you, use a strong step stool that has a grab bar.  Keep electrical cords out of the way.  Do not use floor polish or wax that makes floors slippery. If you must use wax, use non-skid floor wax.  Do not have  throw rugs and other things on the floor that can make you trip. What can I do with my stairs?  Do not leave any items on the stairs.  Make sure that there are handrails on both sides of the stairs and use them. Fix handrails that are broken or loose. Make sure that handrails are as long as the stairways.  Check any carpeting to make sure that it is firmly attached to the stairs. Fix any carpet that is loose or worn.  Avoid having throw rugs at the top or bottom of the stairs. If you do have throw rugs, attach them to the floor with carpet tape.  Make sure that you have a light switch at the top of the stairs and the bottom of the stairs. If you do not have them, ask someone to add them for you. What else can I do to help prevent falls?  Wear shoes that:  Do not have high heels.  Have rubber bottoms.  Are comfortable and fit you well.  Are closed at the toe. Do not wear sandals.  If you use a  stepladder:  Make sure that it is fully opened. Do not climb a closed stepladder.  Make sure that both sides of the stepladder are locked into place.  Ask someone to hold it for you, if possible.  Clearly mark and make sure that you can see:  Any grab bars or handrails.  First and last steps.  Where the edge of each step is.  Use tools that help you move around (mobility aids) if they are needed. These include:  Canes.  Walkers.  Scooters.  Crutches.  Turn on the lights when you go into a dark area. Replace any light bulbs as soon as they burn out.  Set up your furniture so you have a clear path. Avoid moving your furniture around.  If any of your floors are uneven, fix them.  If there are any pets around you, be aware of where they are.  Review your medicines with your doctor. Some medicines can make you feel dizzy. This can increase your chance of falling. Ask your doctor what other things that you can do to help prevent falls. This information is not intended to replace advice given to you by your health care provider. Make sure you discuss any questions you have with your health care provider. Document Released: 03/19/2009 Document Revised: 10/29/2015 Document Reviewed: 06/27/2014 Elsevier Interactive Patient Education  2017 [employer].
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
## 2020-01-24 ENCOUNTER — Other Ambulatory Visit: Payer: Self-pay

## 2020-01-24 ENCOUNTER — Encounter: Payer: Self-pay | Admitting: Internal Medicine

## 2020-01-24 ENCOUNTER — Ambulatory Visit (INDEPENDENT_AMBULATORY_CARE_PROVIDER_SITE_OTHER): Payer: PPO | Admitting: Internal Medicine

## 2020-01-24 ENCOUNTER — Telehealth: Payer: Self-pay | Admitting: Internal Medicine

## 2020-01-24 VITALS — BP 130/84 | HR 73 | Temp 97.7°F | Ht 59.75 in | Wt 128.2 lb

## 2020-01-24 DIAGNOSIS — Z79899 Other long term (current) drug therapy: Secondary | ICD-10-CM | POA: Diagnosis not present

## 2020-01-24 DIAGNOSIS — G609 Hereditary and idiopathic neuropathy, unspecified: Secondary | ICD-10-CM

## 2020-01-24 DIAGNOSIS — R198 Other specified symptoms and signs involving the digestive system and abdomen: Secondary | ICD-10-CM

## 2020-01-24 DIAGNOSIS — Z789 Other specified health status: Secondary | ICD-10-CM | POA: Diagnosis not present

## 2020-01-24 DIAGNOSIS — K116 Mucocele of salivary gland: Secondary | ICD-10-CM | POA: Diagnosis not present

## 2020-01-24 DIAGNOSIS — G4700 Insomnia, unspecified: Secondary | ICD-10-CM

## 2020-01-24 DIAGNOSIS — I10 Essential (primary) hypertension: Secondary | ICD-10-CM | POA: Diagnosis not present

## 2020-01-24 DIAGNOSIS — E785 Hyperlipidemia, unspecified: Secondary | ICD-10-CM | POA: Diagnosis not present

## 2020-01-24 MED ORDER — ZOLPIDEM TARTRATE 5 MG PO TABS: ORAL_TABLET | ORAL | 1 refills | Status: DC

## 2020-01-24 MED ORDER — GABAPENTIN 300 MG PO CAPS: ORAL_CAPSULE | ORAL | 1 refills | Status: DC

## 2020-01-24 NOTE — Telephone Encounter (Signed)
Pt is calling about her medication gabapentin (NEURONTIN) 300 MG capsule   She is at the pharmacy to pick it up and it is not sent yet.  Please call her when it is sent to the pharmacy [phone]  Please advise

## 2020-01-24 NOTE — Telephone Encounter (Signed)
1 sent in corrected  rx  To [facility]  ( 3 per day)

## 2020-01-24 NOTE — Telephone Encounter (Signed)
Called patient and LMOVM to return call  Left a detailed voice message to let patient know these have been sent to the pharmacy for the patient.

## 2020-01-24 NOTE — Telephone Encounter (Signed)
Is the dosage correct on this? 1 po BID?  Last refill sent 12/20/2019, # 180 with 0 refills

## 2020-01-24 NOTE — Patient Instructions (Addendum)
For now ok to continue ambien since seems to work   And off the clonazepam.  And limited  Gabapentin Sounds like you are statin intolerant  Stay on the zetia. cholesterol could be better .  Short murmur   May just be a flow  Sound  plan recheck exam in  3-4 months .  Have a good trip.
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
## 2020-01-27 ENCOUNTER — Ambulatory Visit: Payer: PPO | Admitting: Internal Medicine

## 2020-02-03 ENCOUNTER — Ambulatory Visit: Payer: PPO | Admitting: Internal Medicine

## 2020-02-11 DIAGNOSIS — Z85828 Personal history of other malignant neoplasm of skin: Secondary | ICD-10-CM | POA: Diagnosis not present

## 2020-02-11 DIAGNOSIS — L821 Other seborrheic keratosis: Secondary | ICD-10-CM | POA: Diagnosis not present

## 2020-02-11 DIAGNOSIS — D0462 Carcinoma in situ of skin of left upper limb, including shoulder: Secondary | ICD-10-CM | POA: Diagnosis not present

## 2020-02-13 DIAGNOSIS — Z96651 Presence of right artificial knee joint: Secondary | ICD-10-CM | POA: Diagnosis not present

## 2020-02-21 ENCOUNTER — Ambulatory Visit: Payer: PPO

## 2020-02-21 DIAGNOSIS — E785 Hyperlipidemia, unspecified: Secondary | ICD-10-CM

## 2020-02-21 DIAGNOSIS — G4700 Insomnia, unspecified: Secondary | ICD-10-CM

## 2020-02-21 NOTE — Chronic Care Management (AMB) (Signed)
Chronic Care Management Pharmacy  Name: Julie Bonilla MRN: [id number] DOB: December 11, 1940  Initial Questions: 1. Have you seen any other providers since your last visit? Yes  2. Any changes in your medicines or health? Yes    Chief Complaint/ HPI Julie Bonilla,  79 y.o. , female presents for their Follow-Up CCM visit with the clinical pharmacist via telephone due to COVID-19 Pandemic.  PCP : [name], MD  Their chronic conditions include: HTN, HLD, Insomnia, Chronic sinusitis/allergies, GERD, Nerve pain, Osteoporosis, Osteoarthritis, Constipation, TKA   Office Visits: 01/24/20 Julie Ace, MD - Patient presented for chronic disease management follow up. Patient reports having memory issues with gabapentin 600 mg TID and less foggy with 300 mg 1 in AM and 2 in PM. Patient no longer taking clonazepam. Patient reports Ambien is helping with sleep and not taking every night.  01/14/20 Julie Neas, LPN - Patient presented for annual wellness visit. Patient had concerns about insomnia and not sleeping well. Pt up to date on all preventative health screenings.  12/04/19 Julie Ace, MD - Patient presented with bladder pain with and without urination for about 3 days. Urine culture and analysis completed. Prescribed cefdinir x 5 days.  09/23/19 Julie Ace, MD - Patient presented for chronic disease state follow up. Patient reports still having problems with sleep and wants to change clonazepam to Ambien. Pt just started rosuvastatin 3 x per week.  Consult Visit: 11/13/19 Sydnee Levans (Dermatology): Patient presented for follow up. Unable to access notes.  09/03/19 Gaynelle Arabian (ortho): Patient presented for aftercare following joint replacement. Unable to access notes.  08/05/2019- Patient presented to Dr. Lurlean Leyden, DO for oral thrush. Patient reported to recent use of antibiotics. Patient to start nystatin 100,000units/ml, 5 ml's four times daily for 10 days.    Medications: Outpatient Encounter Medications as of 02/21/2020  Medication Sig  . cetirizine (ZYRTEC) 10 MG tablet Take 10 mg by mouth daily as needed for allergies.   Marland Kitchen diclofenac Sodium (VOLTAREN) 1 % GEL diclofenac 1 % topical gel  Apply 4 g by topical route.  . DULoxetine (CYMBALTA) 60 MG capsule TAKE ONE CAPSULE BY MOUTH TWICE DAILY  . ezetimibe (ZETIA) 10 MG tablet Take 1 tablet (10 mg total) by mouth daily.  . fluticasone (FLONASE) 50 MCG/ACT nasal spray Place 2 sprays into both nostrils daily.  Marland Kitchen gabapentin (NEURONTIN) 300 MG capsule Take 1  Po in am and 2 HS or as directed  . losartan (COZAAR) 50 MG tablet Take 1 tablet (50 mg total) by mouth daily.  . Multiple Vitamin (MULTIVITAMIN ADULT PO) Take 1 tablet by mouth daily.  . polyethylene glycol powder (GLYCOLAX/MIRALAX) 17 GM/SCOOP powder 1 cap full in a full glass of water, two times a day for 3 days.  . risedronate (ACTONEL) 35 MG tablet TAKE ONE TABLET BY MOUTH WEEKLY ON EMPTY STOMACH WITH WATER *DO NOT EAT/DRINK/ LIE DOWN FOR 30 MIN AFTER*  . senna-docusate (SENOKOT-S) 8.6-50 MG tablet Take 2 tablets by mouth 2 (two) times daily.  Marland Kitchen zolpidem (AMBIEN) 5 MG tablet TAKE ONE TABLET BY MOUTH DAILY AT BEDTIME AS NEEDED FOR SLEEP (AVOID REGULAR USE)  . methocarbamol (ROBAXIN) 500 MG tablet Take 1 tablet (500 mg total) by mouth every 6 (six) hours as needed for muscle spasms.   No facility-administered encounter medications on file as of 02/21/2020.    Current Diagnosis/Assessment:  Goals Addressed            This Visit's Progress   .  Pharmacy Care Plan       CARE PLAN ENTRY  Current Barriers:  . Chronic Disease Management support, education, and care coordination needs related to HTN, HLD, and Insomnia, Chronic sinusitis/ allergies, GERD, Nerve pain, Osteoporosis, Osteoarthritis, Constipation, status post right knee replacement  Pharmacist Clinical Goal(s):  Marland Kitchen Work with the care management team to address educational,  disease management, and care coordination needs.  . Call provider office for new or worsened signs and symptoms. . Over the next month, continue self health monitoring activities as directed today  . Call care management team with questions or concerns.  . Blood pressure:  Marland Kitchen Maintain blood pressure within goal of your provider (130/80 or 140/90)  . Maintain low salt diet.  . High cholesterol:  . Cholesterol goals: Total Cholesterol goal under 200, Triglycerides goal under 150, HDL goal above 40 (men) or above 50 (women), LDL goal under 100.  . Current cholesterol levels (12/30/2019) - Total cholesterol: 254 - Triglyceride: 219 - HDL: 63 - LDL: 152 . Insomnia: Continue to see improvement in insomnia. . Chronic sinusitis/ allergies: Improve symptoms associated with allergies.  . GERD/ heart burn: Minimize reflux symptoms.  . Nerve pain and osteoarthritis: Continue to see improvement in pain level . Osteoporosis: Continue: prevent bone fractures.  . Constipation: improve bowel movements.   Interventions: . Comprehensive medication review performed. . Collaboration with provider re: medication management . Evaluation of current treatment plans and patient's adherence to plan as established by provider . Assessed patient's education and care coordination needs . Provided disease specific education to patient . Blood pressure:  . Discussed need to continue checking blood pressure at home.  . Discussed diet modifications. DASH diet:  following a diet emphasizing fruits and vegetables and low-fat dairy products along with whole grains, fish, poultry, and nuts. Reducing red meats and sugars.  . Exercising . Reducing the amount of salt intake to 1500mg /per day.  . Recommend using a salt substitute to replace your salt if you need flavor.     . Weight reduction- We discussed losing 5-10% of body weight . Continue:  losartan 50mg ,1 tablet daily . High cholesterol . How to reduce cholesterol  through diet/weight management and physical activity.    . We discussed how a diet high in plant sterols (fruits/vegetables/nuts/whole grains/legumes) may reduce your cholesterol.  Encouraged increasing fiber to a daily intake of 10-25g/day  . Continue: Zetia 10mg , 1 tablet daily    . Insomnia: Continue to take Ambien sparingly to avoid dependence for nightly use. .  . Chronic sinusitis/ allergies . Continue:  - cetirizine (Zyrtec) 10mg , 1 tablet daily as needed  - fluticasone (Flonase) nasal spray (19mcg/ actuation), 2 sprays into both nostrils daily  - Mucinex 400mg , 1 tablet twice daily  . GERD/ acid reflux/ heart burn . Continue:  - omeprazole 40 mg, 1 capsule daily as directed  . Nerve pain . Continue:  - duloxetine 60 mg, 1 capsule twice daily  - gabapentin 300 mg, 1 capsule in AM and 2 capsules at bedtime . Osteoporosis . Continue:  - risedronate 35mg , 1 tablet weekly - citracal/ vitamin D3 660/25mg , 2 tablets once daily  . Osteoarthritis . Continue:  - Aleve 220 mg 1 tablets 2-3 nights per week - Tylenol 500 mg 3 tablets daily - diclofenac 1%, uses BID for arthritis pain  . Constipation . Continue:  - senna-docusate 8.6-50mg , 2 tablets twice daily (only when needed)   - Metamucil (only when needed)  . Status post knee  replacement . Continue:  - Aspirin 81mg , 1 tablet once daily as directed  Patient Self Care Activities:  . Self administers medications as prescribed and Calls provider office for new concerns or questions . Continue current medications as directed by providers.  . Continue following up with primary care provider and/or specialists. . Continue at home blood pressure readings. . Continue working on health habits (diet/ exercise).  Please see past updates related to this goal by clicking on the "Past Updates" button in the selected goal         Hypertension   BP today is:  <140/90  Office blood pressures are  BP Readings from Last 3 Encounters:   02/25/20 138/86  01/24/20 130/84  12/04/19 (!) 142/86   Patient has failed these meds in the past: none  Patient checks BP at home several times per month  Patient home BP readings are ranging: 130/70 mmHg  Patient is controlled on:  - losartan 50mg ,1 tablet daily   We discussed diet and exercise extensively   Plan Continue current medications and control with diet and exercise.    Hyperlipidemia   Lipid Panel     Component Value Date/Time   CHOL 254 (H) 12/30/2019 1003   TRIG 219 (H) 12/30/2019 1003   HDL 63 12/30/2019 1003   CHOLHDL 4.0 12/30/2019 1003   VLDL 57.4 (H) 05/15/2019 1551   LDLCALC 152 (H) 12/30/2019 1003   LDLDIRECT 149.0 05/15/2019 1551    The 10-year ASCVD risk score Mikey Bussing DC Jr., et al., 2013) is: 35.4%   Values used to calculate the score:     Age: 37 years     Sex: Female     Is Non-Hispanic African American: No     Diabetic: No     Tobacco smoker: No     Systolic Blood Pressure: 161 mmHg     Is BP treated: Yes     HDL Cholesterol: 63 mg/dL     Total Cholesterol: 254 mg/dL   Patient has failed these meds in past: pravastatin, rosuvastatin (statin intolerance)  Patient is currently uncontrolled on the following medications:  - Zetia 10mg , 1 tablet daily  We discussed:  diet and exercise extensively  . Exercise: discussed trying to incorporate walking every day if possible; patient is limited in exercise due to back pain . We discussed how a diet high in plant sterols (fruits/vegetables/nuts/whole grains/legumes) may reduce your cholesterol.  Encouraged increasing fiber to a daily intake of 10-25g/day. . Diet: we discussed swapping canola oil for olive oil and avoid cooking with butter  Plan Patient reported statin intolerance.  PCP will consider referral to lipid clinic if patient is willing.  Insomnia   Patient has failed these meds in past: Ambien (tolerance), Belsomra (ineffective), melatonin (ineffective) Patient is currently  controlled on the following medications:  - Ambien 5 mg 1 tablet at bedtime  We discussed:  non-pharmacological interventions for insomnia. (Avoid napping, limit exposure to technology near bedtime, etc).  -Consider trying melatonin again to see if she finds relief as she has not tried it in a while -Limit taking Ambien to a few nights per week as patient is currently taking every night  Plan Continue current medication. If patient builds a tolerance to Ambien again, consider trazodone.  Chronic sinusitis/ allergies   Patient has failed these meds in past: Claritin  Patient is currently controlled on the following medications:  - cetirizine 10mg , 1 tablet daily as needed  - fluticasone nasal spray (30mcg/ actuation),  2 sprays into both nostrils daily  - Mucinex 400mg , 1 tablet twice daily   Plan Continue current medications  GERD   Patient has failed these meds in past:  Patient is currently controlled on the following medications:  - omeprazole 40 mg, 1 capsule daily (only when symptomatic)    We discussed:  non-pharmacological interventions for acid reflux. Take measures to prevent acid reflux, such as avoiding spicy foods, avoiding caffeine, avoid laying down a few hours after eating, and raising the head of the bed.  Plan Reassess the need for omeprazole at next follow up. Continue current medications.   Nerve pain   Patient has failed these meds in past: gabapentin Patient is currently controlled on the following medications:  - duloxetine 60 mg, 1 capsule twice daily  - gabapentin 300 mg, 1 capsule in AM and 2 capsules at bedtime  Plan Continue current medications  Osteoporosis   Last DEXA Scan: 10/2017  T-Score femoral neck: -2.7 (left), -1.8 (right)   Vit D, 25-Hydroxy  Date Value Ref Range Status  03/05/2010 49 30 - 89 ng/mL Final    Comment:    See lab report for associated comment(s)     Patient is a candidate for pharmacologic treatment due to T-Score  < -2.5 in femoral neck  Patient is currently controlled on the following medications:  - risedronate 35mg , 1 tablet weekly - citracal/ vitamin D3 660/25mg , 2 tabs daily   We discussed:  Recommend [id number] units of vitamin D daily. Recommend 1200 mg of calcium daily from dietary and supplemental sources. Recommend weight-bearing and muscle strengthening exercises for building and maintaining bone density.  Plan  Continue current medications  Osteoarthritis   Patient has failed these meds in past: hydrocodone/ APAP, meloxicam,  Patient is currently controlled on the following medications:   - diclofenac gel 1%, uses BID for arthritis pain  - Aleve 220 mg 1 tablets 2-3 nights per week -Tylenol 500 mg 3 tablets daily  We discuss: Limiting intake of 3,000 mg of Tylenol per day to avoid liver damage  Plan Continue current medications.    Constipation   Patient is currently controlled on the following medications:  - senna-docusate 8.6-50mg , 2 tablets twice daily (only when needed)   -Miralax 17 g a couple times a week  Plan Continue current medications  TKA (dvt prophylaxis)  Patient has failed these meds in past: Xarelto (given for short term course) Patient is currently managed on the following medications:     Plan Continue current medications   Vaccines   Reviewed and discussed patient's vaccination history.    Immunization History  Administered Date(s) Administered  . Influenza Split 03/21/2011, 04/05/2012  . Influenza Whole 03/27/2007, 03/25/2008, 03/12/2009, 03/05/2010  . Influenza, High Dose Seasonal PF 02/23/2015, 04/12/2016, 02/23/2017, 03/22/2018  . Influenza,inj,Quad PF,6+ Mos 04/18/2013, 04/02/2014  . Influenza,inj,quad, With Preservative 04/06/2017, 04/06/2018, 03/07/2019  . PFIZER SARS-COV-2 Vaccination 06/13/2019, 07/04/2019  . Pneumococcal Conjugate-13 07/15/2013  . Pneumococcal Polysaccharide-23 03/27/2007  . Td 05/06/1996, 03/05/2010  . Tdap  08/08/2018  . Zoster 03/05/2010  . Zoster Recombinat (Shingrix) 10/05/2017, 01/29/2018    Plan  Recommended patient receive influenza vaccine at pharmacy/in office.    Medication Management   Pt uses [facility] for all medications Uses pill box? Yes Pt endorses 95% compliance  We discussed: Current pharmacy is preferred with insurance plan and patient is satisfied with pharmacy services  Plan  Continue current medication management strategy  Follow up: 4 month phone visit  Watt Climes  Kipp Brood, [place] Pharmacist [place] [facility] [phone]

## 2020-02-24 ENCOUNTER — Other Ambulatory Visit: Payer: Self-pay
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
## 2020-02-25 ENCOUNTER — Encounter: Payer: Self-pay | Admitting: Internal Medicine

## 2020-02-25 ENCOUNTER — Ambulatory Visit (INDEPENDENT_AMBULATORY_CARE_PROVIDER_SITE_OTHER): Payer: PPO | Admitting: Internal Medicine

## 2020-02-25 VITALS — BP 138/86 | HR 76 | Temp 97.9°F | Ht 59.75 in | Wt 129.2 lb

## 2020-02-25 DIAGNOSIS — Z789 Other specified health status: Secondary | ICD-10-CM

## 2020-02-25 DIAGNOSIS — B370 Candidal stomatitis: Secondary | ICD-10-CM

## 2020-02-25 DIAGNOSIS — R198 Other specified symptoms and signs involving the digestive system and abdomen: Secondary | ICD-10-CM

## 2020-02-25 DIAGNOSIS — E785 Hyperlipidemia, unspecified: Secondary | ICD-10-CM | POA: Diagnosis not present

## 2020-02-25 MED ORDER — CLOTRIMAZOLE 10 MG MT TROC: 10.0000 mg | Freq: Every day | OROMUCOSAL | 0 refills | Status: DC

## 2020-02-25 NOTE — Patient Instructions (Addendum)
Troches  dissolve in mouth  5 x per day for   14 days  If  Needed we can refill until all better  There are other treatments  Also . For now will hold on any referrals for  Lipid clinic   Let us know how doing in  2 weeks   Or a sneeded

## 2020-02-25 NOTE — Patient Instructions (Addendum)
Hello Julie Bonilla!  It was great getting to speak with you over the phone. As mentioned, I did discuss other options for cholesterol management with Dr. Regis Bill and she mentioned getting referred to the lipid clinic if you would like. I would love for you to continue working on some of those diet changes we discussed and incorporating as much activity and exercise as you can. I attached a helpful guide for some dietary changes.   As always, please reach out to me if you have any questions or need anything before our next touch base.  Best, Maddie  Jeni Salles, PharmD Clinical Pharmacist [facility]    Visit Information  Goals Addressed            This Visit's Progress   . Pharmacy Care Plan       CARE PLAN ENTRY  Current Barriers:  . Chronic Disease Management support, education, and care coordination needs related to HTN, HLD, and Insomnia, Chronic sinusitis/ allergies, GERD, Nerve pain, Osteoporosis, Osteoarthritis, Constipation, status post right knee replacement  Pharmacist Clinical Goal(s):  Marland Kitchen Work with the care management team to address educational, disease management, and care coordination needs.  . Call provider office for new or worsened signs and symptoms. . Over the next month, continue self health monitoring activities as directed today  . Call care management team with questions or concerns.  . Blood pressure:  Marland Kitchen Maintain blood pressure within goal of your provider (130/80 or 140/90)  . Maintain low salt diet.  . High cholesterol:  . Cholesterol goals: Total Cholesterol goal under 200, Triglycerides goal under 150, HDL goal above 40 (men) or above 50 (women), LDL goal under 100.  . Current cholesterol levels (12/30/2019) - Total cholesterol: 254 - Triglyceride: 219 - HDL: 63 - LDL: 152 . Insomnia: Continue to see improvement in insomnia. . Chronic sinusitis/ allergies: Improve symptoms associated with allergies.  . GERD/  heart burn: Minimize reflux symptoms.  . Nerve pain and osteoarthritis: Continue to see improvement in pain level . Osteoporosis: Continue: prevent bone fractures.  . Constipation: improve bowel movements.   Interventions: . Comprehensive medication review performed. . Collaboration with provider re: medication management . Evaluation of current treatment plans and patient's adherence to plan as established by provider . Assessed patient's education and care coordination needs . Provided disease specific education to patient . Blood pressure:  . Discussed need to continue checking blood pressure at home.  . Discussed diet modifications. DASH diet:  following a diet emphasizing fruits and vegetables and low-fat dairy products along with whole grains, fish, poultry, and nuts. Reducing red meats and sugars.  . Exercising . Reducing the amount of salt intake to 1500mg /per day.  . Recommend using a salt substitute to replace your salt if you need flavor.     . Weight reduction- We discussed losing 5-10% of body weight . Continue:  losartan 50mg ,1 tablet daily . High cholesterol . How to reduce cholesterol through diet/weight management and physical activity.    . We discussed how a diet high in plant sterols (fruits/vegetables/nuts/whole grains/legumes) may reduce your cholesterol.  Encouraged increasing fiber to a daily intake of 10-25g/day  . Continue: Zetia 10mg , 1 tablet daily    . Insomnia: Continue to take Ambien sparingly to avoid dependence for nightly use. .  . Chronic sinusitis/ allergies . Continue:  - cetirizine (Zyrtec) 10mg , 1 tablet daily as needed  - fluticasone (Flonase) nasal spray (58mcg/ actuation), 2 sprays into both nostrils  daily  - Mucinex 400mg , 1 tablet twice daily  . GERD/ acid reflux/ heart burn . Continue:  - omeprazole 40 mg, 1 capsule daily as directed  . Nerve pain . Continue:  - duloxetine 60 mg, 1 capsule twice daily  - gabapentin 300 mg, 1 capsule  in AM and 2 capsules at bedtime . Osteoporosis . Continue:  - risedronate 35mg , 1 tablet weekly - citracal/ vitamin D3 660/25mg , 2 tablets once daily  . Osteoarthritis . Continue:  - Aleve 220 mg 1 tablets 2-3 nights per week - Tylenol 500 mg 3 tablets daily - diclofenac 1%, uses BID for arthritis pain  . Constipation . Continue:  - senna-docusate 8.6-50mg , 2 tablets twice daily (only when needed)   - Metamucil (only when needed)  . Status post knee replacement . Continue:  - Aspirin 81mg , 1 tablet once daily as directed  Patient Self Care Activities:  . Self administers medications as prescribed and Calls provider office for new concerns or questions . Continue current medications as directed by providers.  . Continue following up with primary care provider and/or specialists. . Continue at home blood pressure readings. . Continue working on health habits (diet/ exercise).  Please see past updates related to this goal by clicking on the "Past Updates" button in the selected goal         The patient verbalized understanding of instructions provided today and declined a print copy of patient instruction materials.   Telephone follow up appointment with pharmacy team member scheduled for: 4 months  Jeni Salles, PharmD Clinical Pharmacist [facility]    High Cholesterol  High cholesterol is a condition in which the blood has high levels of a white, waxy, fat-like substance (cholesterol). The human body needs small amounts of cholesterol. The liver makes all the cholesterol that the body needs. Extra (excess) cholesterol comes from the food that we eat. Cholesterol is carried from the liver by the blood through the blood vessels. If you have high cholesterol, deposits (plaques) may build up on the walls of your blood vessels (arteries). Plaques make the arteries narrower and stiffer. Cholesterol plaques increase your risk for heart attack  and stroke. Work with your health care provider to keep your cholesterol levels in a healthy range. What increases the risk? This condition is more likely to develop in people who:  Eat foods that are high in animal fat (saturated fat) or cholesterol.  Are overweight.  Are not getting enough exercise.  Have a family history of high cholesterol. What are the signs or symptoms? There are no symptoms of this condition. How is this diagnosed? This condition may be diagnosed from the results of a blood test.  If you are older than age 20, your health care provider may check your cholesterol every 4-6 years.  You may be checked more often if you already have high cholesterol or other risk factors for heart disease. The blood test for cholesterol measures:  "Bad" cholesterol (LDL cholesterol). This is the main type of cholesterol that causes heart disease. The desired level for LDL is less than 100.  "Good" cholesterol (HDL cholesterol). This type helps to protect against heart disease by cleaning the arteries and carrying the LDL away. The desired level for HDL is 60 or higher.  Triglycerides. These are fats that the body can store or burn for energy. The desired number for triglycerides is lower than 150.  Total cholesterol. This is a measure of the total amount  of cholesterol in your blood, including LDL cholesterol, HDL cholesterol, and triglycerides. A healthy number is less than 200. How is this treated? This condition is treated with diet changes, lifestyle changes, and medicines. Diet changes  This may include eating more whole grains, fruits, vegetables, nuts, and fish.  This may also include cutting back on red meat and foods that have a lot of added sugar. Lifestyle changes  Changes may include getting at least 40 minutes of aerobic exercise 3 times a week. Aerobic exercises include walking, biking, and swimming. Aerobic exercise along with a healthy diet can help you  maintain a healthy weight.  Changes may also include quitting smoking. Medicines  Medicines are usually given if diet and lifestyle changes have failed to reduce your cholesterol to healthy levels.  Your health care provider may prescribe a statin medicine. Statin medicines have been shown to reduce cholesterol, which can reduce the risk of heart disease. Follow these instructions at home: Eating and drinking If told by your health care provider:  Eat chicken (without skin), fish, veal, shellfish, ground [place] breast, and round or loin cuts of red meat.  Do not eat fried foods or fatty meats, such as hot dogs and salami.  Eat plenty of fruits, such as apples.  Eat plenty of vegetables, such as broccoli, potatoes, and carrots.  Eat beans, peas, and lentils.  Eat grains such as barley, rice, couscous, and bulgur wheat.  Eat pasta without cream sauces.  Use skim or nonfat milk, and eat low-fat or nonfat yogurt and cheeses.  Do not eat or drink whole milk, cream, ice cream, egg yolks, or hard cheeses.  Do not eat stick margarine or tub margarines that contain trans fats (also called partially hydrogenated oils).  Do not eat saturated tropical oils, such as coconut oil and palm oil.  Do not eat cakes, cookies, crackers, or other baked goods that contain trans fats.  General instructions  Exercise as directed by your health care provider. Increase your activity level with activities such as gardening, walking, and taking the stairs.  Take over-the-counter and prescription medicines only as told by your health care provider.  Do not use any products that contain nicotine or tobacco, such as cigarettes and e-cigarettes. If you need help quitting, ask your health care provider.  Keep all follow-up visits as told by your health care provider. This is important. Contact a health care provider if:  You are struggling to maintain a healthy diet or weight.  You need help to start  on an exercise program.  You need help to stop smoking. Get help right away if:  You have chest pain.  You have trouble breathing. This information is not intended to replace advice given to you by your health care provider. Make sure you discuss any questions you have with your health care provider. Document Revised: 05/26/2017 Document Reviewed: 11/21/2015 Elsevier Patient Education  [place].

## 2020-03-13 ENCOUNTER — Telehealth: Payer: Self-pay | Admitting: Internal Medicine

## 2020-03-13 NOTE — Telephone Encounter (Signed)
  pt  wanted to speak with the Dr about a medication  that was prescribed for thrush  is not working want to speak with the nurse   [phone]

## 2020-03-16 ENCOUNTER — Telehealth: Payer: Self-pay | Admitting: Internal Medicine

## 2020-03-16 NOTE — Telephone Encounter (Signed)
Pt called  On 10/08 and is calling again   pt medication is not working for her thrush  [phone]

## 2020-03-23 ENCOUNTER — Encounter: Payer: Self-pay | Admitting: Internal Medicine

## 2020-03-23 ENCOUNTER — Telehealth (INDEPENDENT_AMBULATORY_CARE_PROVIDER_SITE_OTHER): Payer: PPO | Admitting: Internal Medicine

## 2020-03-23 DIAGNOSIS — R198 Other specified symptoms and signs involving the digestive system and abdomen: Secondary | ICD-10-CM | POA: Diagnosis not present

## 2020-03-23 DIAGNOSIS — R432 Parageusia: Secondary | ICD-10-CM

## 2020-03-23 DIAGNOSIS — Z8619 Personal history of other infectious and parasitic diseases: Secondary | ICD-10-CM

## 2020-03-23 NOTE — Telephone Encounter (Signed)
Patient has a virtual visit with pcp today.
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
## 2020-03-23 NOTE — Telephone Encounter (Signed)
Pt scheduled virtual visit for 10/18.

## 2020-03-23 NOTE — Telephone Encounter (Signed)
Noted  

## 2020-04-03 DIAGNOSIS — Z85828 Personal history of other malignant neoplasm of skin: Secondary | ICD-10-CM | POA: Diagnosis not present

## 2020-04-03 DIAGNOSIS — L821 Other seborrheic keratosis: Secondary | ICD-10-CM | POA: Diagnosis not present

## 2020-04-03 DIAGNOSIS — L570 Actinic keratosis: Secondary | ICD-10-CM | POA: Diagnosis not present

## 2020-04-07 DIAGNOSIS — M1712 Unilateral primary osteoarthritis, left knee: Secondary | ICD-10-CM | POA: Diagnosis not present

## 2020-04-07 DIAGNOSIS — M25361 Other instability, right knee: Secondary | ICD-10-CM | POA: Diagnosis not present

## 2020-04-07 DIAGNOSIS — Z96651 Presence of right artificial knee joint: Secondary | ICD-10-CM | POA: Diagnosis not present

## 2020-04-22 DIAGNOSIS — R2689 Other abnormalities of gait and mobility: Secondary | ICD-10-CM | POA: Diagnosis not present

## 2020-04-22 DIAGNOSIS — M25561 Pain in right knee: Secondary | ICD-10-CM | POA: Diagnosis not present

## 2020-04-28 ENCOUNTER — Other Ambulatory Visit: Payer: Self-pay | Admitting: Internal Medicine

## 2020-04-28 DIAGNOSIS — R2689 Other abnormalities of gait and mobility: Secondary | ICD-10-CM | POA: Diagnosis not present

## 2020-04-28 DIAGNOSIS — M25561 Pain in right knee: Secondary | ICD-10-CM | POA: Diagnosis not present

## 2020-04-29 NOTE — Telephone Encounter (Signed)
Last filled 02/27/20

## 2020-05-01 DIAGNOSIS — R2689 Other abnormalities of gait and mobility: Secondary | ICD-10-CM | POA: Diagnosis not present

## 2020-05-01 DIAGNOSIS — M25561 Pain in right knee: Secondary | ICD-10-CM | POA: Diagnosis not present

## 2020-05-04 DIAGNOSIS — R2689 Other abnormalities of gait and mobility: Secondary | ICD-10-CM | POA: Diagnosis not present

## 2020-05-04 DIAGNOSIS — M25561 Pain in right knee: Secondary | ICD-10-CM | POA: Diagnosis not present

## 2020-05-05 DIAGNOSIS — K136 Irritative hyperplasia of oral mucosa: Secondary | ICD-10-CM | POA: Diagnosis not present

## 2020-05-07 DIAGNOSIS — R2689 Other abnormalities of gait and mobility: Secondary | ICD-10-CM | POA: Diagnosis not present

## 2020-05-07 DIAGNOSIS — M25561 Pain in right knee: Secondary | ICD-10-CM | POA: Diagnosis not present

## 2020-05-11 DIAGNOSIS — M25561 Pain in right knee: Secondary | ICD-10-CM | POA: Diagnosis not present

## 2020-05-11 DIAGNOSIS — R2689 Other abnormalities of gait and mobility: Secondary | ICD-10-CM | POA: Diagnosis not present

## 2020-05-14 DIAGNOSIS — R2689 Other abnormalities of gait and mobility: Secondary | ICD-10-CM | POA: Diagnosis not present

## 2020-05-14 DIAGNOSIS — M25561 Pain in right knee: Secondary | ICD-10-CM | POA: Diagnosis not present

## 2020-05-18 DIAGNOSIS — M25561 Pain in right knee: Secondary | ICD-10-CM | POA: Diagnosis not present

## 2020-05-18 DIAGNOSIS — R2689 Other abnormalities of gait and mobility: Secondary | ICD-10-CM | POA: Diagnosis not present

## 2020-05-20 DIAGNOSIS — R2689 Other abnormalities of gait and mobility: Secondary | ICD-10-CM | POA: Diagnosis not present

## 2020-05-20 DIAGNOSIS — M25561 Pain in right knee: Secondary | ICD-10-CM | POA: Diagnosis not present

## 2020-06-03 DIAGNOSIS — R2689 Other abnormalities of gait and mobility: Secondary | ICD-10-CM | POA: Diagnosis not present

## 2020-06-03 DIAGNOSIS — M25561 Pain in right knee: Secondary | ICD-10-CM | POA: Diagnosis not present

## 2020-06-09 DIAGNOSIS — R2689 Other abnormalities of gait and mobility: Secondary | ICD-10-CM | POA: Diagnosis not present

## 2020-06-09 DIAGNOSIS — Z96651 Presence of right artificial knee joint: Secondary | ICD-10-CM | POA: Diagnosis not present

## 2020-06-09 DIAGNOSIS — M25561 Pain in right knee: Secondary | ICD-10-CM | POA: Diagnosis not present

## 2020-06-11 DIAGNOSIS — M25561 Pain in right knee: Secondary | ICD-10-CM | POA: Diagnosis not present

## 2020-06-11 DIAGNOSIS — R2689 Other abnormalities of gait and mobility: Secondary | ICD-10-CM | POA: Diagnosis not present

## 2020-06-15 DIAGNOSIS — R2689 Other abnormalities of gait and mobility: Secondary | ICD-10-CM | POA: Diagnosis not present

## 2020-06-15 DIAGNOSIS — M25561 Pain in right knee: Secondary | ICD-10-CM | POA: Diagnosis not present
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
## 2020-06-17 ENCOUNTER — Ambulatory Visit (INDEPENDENT_AMBULATORY_CARE_PROVIDER_SITE_OTHER): Payer: PPO | Admitting: Internal Medicine

## 2020-06-17 ENCOUNTER — Other Ambulatory Visit: Payer: Self-pay

## 2020-06-17 ENCOUNTER — Encounter: Payer: Self-pay | Admitting: Internal Medicine

## 2020-06-17 VITALS — BP 155/80 | HR 72 | Temp 98.5°F | Ht 59.0 in | Wt 130.2 lb

## 2020-06-17 DIAGNOSIS — Z96651 Presence of right artificial knee joint: Secondary | ICD-10-CM

## 2020-06-17 DIAGNOSIS — M79605 Pain in left leg: Secondary | ICD-10-CM | POA: Diagnosis not present

## 2020-06-17 DIAGNOSIS — I10 Essential (primary) hypertension: Secondary | ICD-10-CM | POA: Diagnosis not present

## 2020-06-17 DIAGNOSIS — G609 Hereditary and idiopathic neuropathy, unspecified: Secondary | ICD-10-CM

## 2020-06-17 DIAGNOSIS — M170 Bilateral primary osteoarthritis of knee: Secondary | ICD-10-CM | POA: Diagnosis not present

## 2020-06-17 DIAGNOSIS — Z79899 Other long term (current) drug therapy: Secondary | ICD-10-CM

## 2020-06-17 NOTE — Patient Instructions (Addendum)
BP up today  Check readings at home and send in readings  X 5 days .  Not sure if neuropathy with or without back   Problem .  To get more  Help to decide . Will get neurology to opine   Get  Dr Maureen Ralphs   Teams  To check the left thigh pain in case could be coming from the back or hip area .   Make FU appt with me for about  1 months from now to FU BP and  Medication review  .

## 2020-06-18 ENCOUNTER — Encounter: Payer: Self-pay | Admitting: Neurology

## 2020-06-22 ENCOUNTER — Other Ambulatory Visit: Payer: Self-pay | Admitting: Internal Medicine

## 2020-06-25 ENCOUNTER — Ambulatory Visit: Payer: PPO | Admitting: Pharmacist

## 2020-06-25 ENCOUNTER — Other Ambulatory Visit: Payer: Self-pay | Admitting: Internal Medicine

## 2020-06-25 NOTE — Chronic Care Management (AMB) (Signed)
Chronic Care Management Pharmacy  Name: Julie Bonilla MRN: [id number] DOB: 1940/10/27  Initial Questions: 1. Have you seen any other providers since your last visit? Yes  2. Any changes in your medicines or health? Yes    Chief Complaint/ HPI Julie Bonilla,  80 y.o. , female presents for their Follow-Up CCM visit with the clinical pharmacist via telephone due to COVID-19 Pandemic.  PCP : [name], MD  Their chronic conditions include: HTN, HLD, Insomnia, Chronic sinusitis/allergies, GERD, Nerve pain, Osteoporosis, Osteoarthritis, Constipation, TKA   Office Visits: 06/17/20 Shanon Ace, MD: Patient presented for leg pain and medication management. Referral placed to neurology.  03/23/20 Shanon Ace, MD: Patient presented for concerns with white patches on tongue and unchanged. Referral placed for ENT.  02/25/20 Shanon Ace, MD: Patient presented for concerns with white patches on tongue. Prescribed clotrimazole troches.  01/24/20 Shanon Ace, MD - Patient presented for chronic disease management follow up. Patient reports having memory issues with gabapentin 600 mg TID and less foggy with 300 mg 1 in AM and 2 in PM. Patient no longer taking clonazepam. Patient reports Ambien is helping with sleep and not taking every night.  01/14/20 Ofilia Neas, LPN - Patient presented for annual wellness visit. Patient had concerns about insomnia and not sleeping well. Pt up to date on all preventative health screenings.  12/04/19 Shanon Ace, MD - Patient presented with bladder pain with and without urination for about 3 days. Urine culture and analysis completed. Prescribed cefdinir x 5 days.  Consult Visit: 06/10/19 Gerrit Halls ([facility]): Patient presented for follow up. Unable to access notes.  05/07/20 Loralie Champagne, PT (PT): Patient presented for PT session for right knee pain.  05/05/20 Izora Gala (otolaryngology): Patient presented for follow up for concerns of  thrush.  04/03/20 Sydnee Levans (Dermatology): Patient presented for follow up. Unable to access notes.  11/13/19 Sydnee Levans (Dermatology): Patient presented for follow up. Unable to access notes.  09/03/19 Gaynelle Arabian (ortho): Patient presented for aftercare following joint replacement. Unable to access notes.  08/05/2019- Patient presented to Dr. Lurlean Leyden, DO for oral thrush. Patient reported to recent use of antibiotics. Patient to start nystatin 100,000units/ml, 5 ml's four times daily for 10 days.   Medications: Outpatient Encounter Medications as of 06/25/2020  Medication Sig  . cetirizine (ZYRTEC) 10 MG tablet Take 10 mg by mouth daily as needed for allergies.   . chlorhexidine (PERIDEX) 0.12 % solution SMARTSIG:By Mouth  . clotrimazole (MYCELEX) 10 MG troche Take 1 tablet (10 mg total) by mouth 5 (five) times daily.  . diclofenac Sodium (VOLTAREN) 1 % GEL diclofenac 1 % topical gel  Apply 4 g by topical route.  . DULoxetine (CYMBALTA) 60 MG capsule TAKE ONE CAPSULE BY MOUTH TWICE DAILY  . fluticasone (FLONASE) 50 MCG/ACT nasal spray Place 2 sprays into both nostrils daily.  Marland Kitchen losartan (COZAAR) 50 MG tablet TAKE ONE TABLET BY MOUTH ONE TIME DAILY  . methocarbamol (ROBAXIN) 500 MG tablet Take 1 tablet (500 mg total) by mouth every 6 (six) hours as needed for muscle spasms.  . Multiple Vitamin (MULTIVITAMIN ADULT PO) Take 1 tablet by mouth daily.  . polyethylene glycol powder (GLYCOLAX/MIRALAX) 17 GM/SCOOP powder 1 cap full in a full glass of water, two times a day for 3 days.  Marland Kitchen senna-docusate (SENOKOT-S) 8.6-50 MG tablet Take 2 tablets by mouth 2 (two) times daily.  Marland Kitchen zolpidem (AMBIEN) 5 MG tablet TAKE ONE TABLET BY MOUTH AT BEDTIME AS  NEEDED for sleep *avoid regular use* (Patient not taking: Reported on 06/17/2020)  . [DISCONTINUED] ezetimibe (ZETIA) 10 MG tablet Take 1 tablet (10 mg total) by mouth daily.  . [DISCONTINUED] gabapentin (NEURONTIN) 300 MG capsule  Take 1  Po in am and 2 HS or as directed  . [DISCONTINUED] risedronate (ACTONEL) 35 MG tablet TAKE ONE TABLET BY MOUTH WEEKLY ON EMPTY STOMACH WITH WATER *DO NOT EAT/DRINK/ LIE DOWN FOR 30 MIN AFTER*   No facility-administered encounter medications on file as of 06/25/2020.     Current Diagnosis/Assessment:  Goals Addressed            This Visit's Progress   . Pharmacy Care Plan       CARE PLAN ENTRY  Current Barriers:  . Chronic Disease Management support, education, and care coordination needs related to HTN, HLD, and Insomnia, Chronic sinusitis/ allergies, GERD, Nerve pain, Osteoporosis, Osteoarthritis, Constipation, status post right knee replacement  Pharmacist Clinical Goal(s):  Marland Kitchen Work with the care management team to address educational, disease management, and care coordination needs.  . Call provider office for new or worsened signs and symptoms. . Over the next month, continue self health monitoring activities as directed today  . Call care management team with questions or concerns.  . Blood pressure:  Marland Kitchen Maintain blood pressure within goal of your provider < 140/90 . Maintain low salt diet.  . High cholesterol:  . Cholesterol goals: Total Cholesterol goal under 200, Triglycerides goal under 150, HDL goal above 40 (men) or above 50 (women), LDL goal under 100.  . Current cholesterol levels (12/30/2019) - Total cholesterol: 254 - Triglyceride: 219 - HDL: 63 - LDL: 152 . Insomnia: Continue to see improvement in insomnia. . Chronic sinusitis/ allergies: Improve symptoms associated with allergies.  . GERD/ heart burn: Minimize reflux symptoms.  . Nerve pain and osteoarthritis: Continue to see improvement in pain level . Osteoporosis: Continue: prevent bone fractures.  . Constipation: improve bowel movements.   Interventions: . Comprehensive medication review performed. . Collaboration with provider re: medication management . Evaluation of current treatment plans and  patient's adherence to plan as established by provider . Assessed patient's education and care coordination needs . Provided disease specific education to patient . Blood pressure:  . Discussed need to continue checking blood pressure at home.  . Discussed diet modifications. DASH diet:  following a diet emphasizing fruits and vegetables and low-fat dairy products along with whole grains, fish, poultry, and nuts. Reducing red meats and sugars.  . Exercising . Reducing the amount of salt intake to 1500mg /per day.  . Recommend using a salt substitute to replace your salt if you need flavor.     . Weight reduction- We discussed losing 5-10% of body weight . Continue:  losartan 50mg ,1 tablet daily . High cholesterol . How to reduce cholesterol through diet/weight management and physical activity.    . We discussed how a diet high in plant sterols (fruits/vegetables/nuts/whole grains/legumes) may reduce your cholesterol.  Encouraged increasing fiber to a daily intake of 10-25g/day  . Continue: Zetia 10mg , 1 tablet daily    . Insomnia: Continue to take Ambien sparingly to avoid dependence for nightly use. .  . Chronic sinusitis/ allergies . Continue:  - cetirizine (Zyrtec) 10mg , 1 tablet daily as needed  - fluticasone (Flonase) nasal spray (7mcg/ actuation), 2 sprays into both nostrils daily  - Mucinex 400mg , 1 tablet twice daily  . GERD/ acid reflux/ heart burn . Continue:  - omeprazole 40 mg, 1  capsule daily as directed  . Nerve pain . Continue:  - duloxetine 60 mg, 1 capsule twice daily  - gabapentin 300 mg, 1 capsule in AM and 2 capsules at bedtime . Osteoporosis . Continue:  - risedronate 35mg , 1 tablet weekly - citracal/ vitamin D3 660/25mg , 2 tablets once daily  . Osteoarthritis . Continue:  - Aleve 220 mg 1 tablets 2-3 nights per week - Tylenol 500 mg 3 tablets daily - diclofenac 1%, uses BID for arthritis pain  . Constipation . Continue:  - senna-docusate 8.6-50mg , 2  tablets twice daily (only when needed)   - Metamucil (only when needed)  . Status post knee replacement . Continue:  - Aspirin 81mg , 1 tablet once daily as directed  Patient Self Care Activities:  . Self administers medications as prescribed and Calls provider office for new concerns or questions . Continue current medications as directed by providers.  . Continue following up with primary care provider and/or specialists. . Continue at home blood pressure readings. . Continue working on health habits (diet/ exercise).  Please see past updates related to this goal by clicking on the "Past Updates" button in the selected goal         Hypertension   BP today is:  <140/90  Office blood pressures are  BP Readings from Last 3 Encounters:  06/17/20 (!) 155/80  02/25/20 138/86  01/24/20 130/84   Patient has failed these meds in the past: none  Patient checks BP at home several times per month  Patient home BP readings are ranging: 148/85 (new machine)  Patient is controlled on:  - losartan 50mg ,1 tablet daily   We discussed diet and exercise extensively   Plan Continue current medications and control with diet and exercise.   BP assessment in 2 months.  Hyperlipidemia  LDL goal < 100  Lipid Panel     Component Value Date/Time   CHOL 254 (H) 12/30/2019 1003   TRIG 219 (H) 12/30/2019 1003   HDL 63 12/30/2019 1003   CHOLHDL 4.0 12/30/2019 1003   VLDL 57.4 (H) 05/15/2019 1551   LDLCALC 152 (H) 12/30/2019 1003   LDLDIRECT 149.0 05/15/2019 1551    The 10-year ASCVD risk score Mikey Bussing DC Jr., et al., 2013) is: 42.5%   Values used to calculate the score:     Age: 65 years     Sex: Female     Is Non-Hispanic African American: No     Diabetic: No     Tobacco smoker: No     Systolic Blood Pressure: 626 mmHg     Is BP treated: Yes     HDL Cholesterol: 63 mg/dL     Total Cholesterol: 254 mg/dL   Patient has failed these meds in past: pravastatin, rosuvastatin (statin  intolerance)  Patient is currently uncontrolled on the following medications:  -Zetia 10mg , 1 tablet daily  We discussed:  diet and exercise extensively  . Exercise: discussed trying to incorporate walking every day if possible; patient is limited in exercise due to back pain . We discussed how a diet high in plant sterols (fruits/vegetables/nuts/whole grains/legumes) may reduce your cholesterol.  Encouraged increasing fiber to a daily intake of 10-25g/day. . Diet: we discussed swapping canola oil for olive oil and avoid cooking with butter  Plan Patient reported statin intolerance.  Patient wishes to hold off on lipid clinic referral for now.  Insomnia   Patient has failed these meds in past: Ambien (tolerance), Belsomra (ineffective), melatonin (ineffective) Patient is currently  uncontrolled on the following medications:  - Ambien 5 mg 1 tablet at bedtime  We discussed:  non-pharmacological interventions for insomnia. (Avoid napping, limit exposure to technology near bedtime, etc).   -Patient sleeps 6 hours per night with Ambien and will not allow herself to take it more than twice a week -Patient never had problems with clonazepam and has been off for 3-4 months -Patient reports she can't shut her mind off and doesn't feel anxious   Plan Continue current medication. If patient builds a tolerance to Ambien again, consider trazodone.  Will send message to PCP.  Chronic sinusitis/ allergies   Patient has failed these meds in past: Claritin  Patient is currently controlled on the following medications:  - cetirizine 10mg , 1 tablet daily as needed  - fluticasone nasal spray (9mcg/ actuation), 2 sprays into both nostrils daily  - Mucinex 400mg , 1 tablet twice daily   Plan Continue current medications  GERD   Patient has failed these meds in past:  Patient is currently controlled on the following medications:  - omeprazole 40 mg, 1 capsule daily (only when symptomatic)    We  discussed:  non-pharmacological interventions for acid reflux. Take measures to prevent acid reflux, such as avoiding spicy foods, avoiding caffeine, avoid laying down a few hours after eating, and raising the head of the bed.  Plan Reassess the need for omeprazole at next follow up. Continue current medications.   Nerve pain   Patient has failed these meds in past: gabapentin Patient is currently controlled on the following medications:  - duloxetine 60 mg, 1 capsule twice daily  - gabapentin 300 mg, 1 capsule in AM and 2 capsules at bedtime  Plan Continue current medications  Osteoporosis   Last DEXA Scan: 10/2017  T-Score femoral neck: -2.7 (left), -1.8 (right)   Vit D, 25-Hydroxy  Date Value Ref Range Status  03/05/2010 49 30 - 89 ng/mL Final    Comment:    See lab report for associated comment(s)     Patient is a candidate for pharmacologic treatment due to T-Score < -2.5 in femoral neck  Patient is currently controlled on the following medications:  - risedronate 35mg , 1 tablet weekly - citracal/ vitamin D3 660/25mg , 2 tabs daily  - 1000 units  We discussed:  Recommend [phone] units of vitamin D daily. Recommend 1200 mg of calcium daily from dietary and supplemental sources. Recommend weight-bearing and muscle strengthening exercises for building and maintaining bone density.   Plan  Continue current medications  Osteoarthritis   Patient has failed these meds in past: hydrocodone/ APAP, meloxicam,  Patient is currently controlled on the following medications:   - diclofenac gel 1%, uses BID for arthritis pain  - Aleve 220 mg 1 tablets 2-3 nights per week -Tylenol 500 mg 3 tablets daily  We discuss: Limiting intake of 3,000 mg of Tylenol per day to avoid liver damage  Plan Continue current medications.    Constipation   Patient is currently controlled on the following medications:  - senna-docusate 8.6-50mg , 2 tablets twice daily (only when needed)    -Miralax 17 g a couple times a week  Plan Continue current medications  TKA (dvt prophylaxis)  Patient has failed these meds in past: Xarelto (given for short term course) Patient is currently managed on the following medications:  -No medications   Plan Continue current medications   Vaccines   Reviewed and discussed patient's vaccination history.    Immunization History  Administered Date(s) Administered  .  Influenza Split 03/21/2011, 04/05/2012  . Influenza Whole 03/27/2007, 03/25/2008, 03/12/2009, 03/05/2010  . Influenza, High Dose Seasonal PF 02/23/2015, 04/12/2016, 02/23/2017, 03/22/2018, 05/06/2020  . Influenza,inj,Quad PF,6+ Mos 04/18/2013, 04/02/2014  . Influenza,inj,quad, With Preservative 04/06/2017, 04/06/2018, 03/07/2019  . PFIZER(Purple Top)SARS-COV-2 Vaccination 06/13/2019, 07/04/2019, 04/08/2020  . Pneumococcal Conjugate-13 07/15/2013  . Pneumococcal Polysaccharide-23 03/27/2007  . Td 05/06/1996, 03/05/2010  . Tdap 08/08/2018  . Unspecified SARS-COV-2 Vaccination 07/16/2019  . Zoster 03/05/2010  . Zoster Recombinat (Shingrix) 10/05/2017, 01/29/2018   Verified COVID vaccine booster with [facility] and updated immunization record.  Plan  Patient is up to date on all immunizations.   Medication Management   Pt uses [facility] for all medications Uses pill box? Yes Pt endorses 95% compliance  We discussed: Current pharmacy is preferred with insurance plan and patient is satisfied with pharmacy services  Plan  Continue current medication management strategy  Follow up: 4 month phone visit  Jeni Salles, PharmD Clinical Pharmacist [facility] [phone]

## 2020-06-26 ENCOUNTER — Other Ambulatory Visit: Payer: Self-pay

## 2020-06-26 ENCOUNTER — Other Ambulatory Visit: Payer: Self-pay | Admitting: Internal Medicine

## 2020-06-26 MED ORDER — EZETIMIBE 10 MG PO TABS
10.0000 mg | ORAL_TABLET | Freq: Every day | ORAL | 0 refills | Status: DC
Start: 2020-06-26 — End: 2020-09-24

## 2020-06-29 ENCOUNTER — Encounter: Payer: Self-pay | Admitting: Internal Medicine

## 2020-06-29 DIAGNOSIS — Z780 Asymptomatic menopausal state: Secondary | ICD-10-CM | POA: Diagnosis not present

## 2020-06-29 DIAGNOSIS — M85852 Other specified disorders of bone density and structure, left thigh: Secondary | ICD-10-CM | POA: Diagnosis not present

## 2020-06-29 DIAGNOSIS — Z1231 Encounter for screening mammogram for malignant neoplasm of breast: Secondary | ICD-10-CM | POA: Diagnosis not present

## 2020-06-29 LAB — HM DEXA SCAN

## 2020-07-03 DIAGNOSIS — M1712 Unilateral primary osteoarthritis, left knee: Secondary | ICD-10-CM | POA: Diagnosis not present

## 2020-07-03 DIAGNOSIS — Z96651 Presence of right artificial knee joint: Secondary | ICD-10-CM | POA: Diagnosis not present

## 2020-07-05 ENCOUNTER — Other Ambulatory Visit: Payer: Self-pay | Admitting: Internal Medicine

## 2020-07-06 ENCOUNTER — Encounter: Payer: Self-pay | Admitting: Internal Medicine

## 2020-07-06 NOTE — Telephone Encounter (Signed)
Last office visit-- 06/17/2020 Last refill-01/2020

## 2020-07-20 ENCOUNTER — Ambulatory Visit: Payer: PPO | Admitting: Internal Medicine

## 2020-07-21 ENCOUNTER — Ambulatory Visit (INDEPENDENT_AMBULATORY_CARE_PROVIDER_SITE_OTHER): Payer: PPO | Admitting: Internal Medicine

## 2020-07-21 ENCOUNTER — Other Ambulatory Visit: Payer: Self-pay

## 2020-07-21 ENCOUNTER — Encounter: Payer: Self-pay | Admitting: Internal Medicine

## 2020-07-21 VITALS — BP 150/80 | HR 83 | Temp 97.7°F | Ht 59.0 in | Wt 128.8 lb

## 2020-07-21 DIAGNOSIS — Z79899 Other long term (current) drug therapy: Secondary | ICD-10-CM

## 2020-07-21 DIAGNOSIS — G479 Sleep disorder, unspecified: Secondary | ICD-10-CM

## 2020-07-21 DIAGNOSIS — I10 Essential (primary) hypertension: Secondary | ICD-10-CM | POA: Diagnosis not present

## 2020-07-21 DIAGNOSIS — M170 Bilateral primary osteoarthritis of knee: Secondary | ICD-10-CM

## 2020-07-21 DIAGNOSIS — M545 Low back pain, unspecified: Secondary | ICD-10-CM | POA: Diagnosis not present

## 2020-07-21 DIAGNOSIS — M4187 Other forms of scoliosis, lumbosacral region: Secondary | ICD-10-CM | POA: Diagnosis not present

## 2020-07-21 DIAGNOSIS — M48061 Spinal stenosis, lumbar region without neurogenic claudication: Secondary | ICD-10-CM | POA: Diagnosis not present

## 2020-07-21 MED ORDER — MIRTAZAPINE 7.5 MG PO TABS: 7.5000 mg | ORAL_TABLET | Freq: Every day | ORAL | 1 refills | Status: DC

## 2020-07-21 MED ORDER — LOSARTAN POTASSIUM 100 MG PO TABS: 100.0000 mg | ORAL_TABLET | Freq: Every day | ORAL | 1 refills | Status: DC
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
## 2020-07-21 NOTE — Patient Instructions (Signed)
Bp is too high at this time   Increase the losartan to 100 mg per day  Send in readings in about 3 weeks   We at that time may need to add more medication  To get to goal range  Try the mirtazapine  7.5 mg at night and can increase to 15 mg if works better  This is not a sleeping pil but can be sedating used to be used for depression anxiety at higher doses .   If all else fails we can try going back to clonazepam but that is not for sleep but for  Restless leg and anxiety   Reasons.   Make appt for 2 months from now.

## 2020-07-22 ENCOUNTER — Ambulatory Visit: Payer: PPO | Admitting: Internal Medicine

## 2020-08-04 ENCOUNTER — Telehealth: Payer: Self-pay | Admitting: Internal Medicine

## 2020-08-04 NOTE — Telephone Encounter (Signed)
Patient is calling and stated that the provider changed her medication dosage for Losartan and would like a call back, please advise. CB is [phone]

## 2020-08-04 NOTE — Telephone Encounter (Signed)
Spoke to patient and she states that she is still finishing up the Losartan 50 mg but take 2 tabs daily and then will start the Losartan 100 mg.  She is keeping a reading of her BP on a calender and will let us know in a couple weeks what she has been getting. Dm/cma

## 2020-08-04 NOTE — Telephone Encounter (Signed)
lft VM  To rtn call. Dm/cma

## 2020-08-10 ENCOUNTER — Other Ambulatory Visit: Payer: Self-pay | Admitting: Internal Medicine

## 2020-08-14 ENCOUNTER — Ambulatory Visit (INDEPENDENT_AMBULATORY_CARE_PROVIDER_SITE_OTHER): Payer: PPO | Admitting: Internal Medicine

## 2020-08-14 ENCOUNTER — Other Ambulatory Visit: Payer: Self-pay

## 2020-08-14 VITALS — BP 140/72 | HR 83 | Temp 97.9°F | Resp 18 | Wt 132.7 lb

## 2020-08-14 DIAGNOSIS — N3001 Acute cystitis with hematuria: Secondary | ICD-10-CM

## 2020-08-14 DIAGNOSIS — R319 Hematuria, unspecified: Secondary | ICD-10-CM

## 2020-08-14 LAB — POCT URINALYSIS DIPSTICK
Bilirubin, UA: NEGATIVE
Glucose, UA: NEGATIVE
Ketones, UA: NEGATIVE
Nitrite, UA: NEGATIVE
Protein, UA: NEGATIVE
Spec Grav, UA: 1.01
Urobilinogen, UA: NEGATIVE U/dL — AB
pH, UA: 7

## 2020-08-14 MED ORDER — SULFAMETHOXAZOLE-TRIMETHOPRIM 800-160 MG PO TABS: 1.0000 | ORAL_TABLET | Freq: Two times a day (BID) | ORAL | 0 refills | Status: AC

## 2020-08-14 NOTE — Addendum Note (Signed)
Addended by: Tessie Fass D on: 08/14/2020 04:01 PM   Modules accepted: Orders
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
## 2020-08-14 NOTE — Patient Instructions (Signed)
-Nice seeing you today!!  -Start Bactrim DS 1 tablet twice daily for 7 days..  -Come back to see [name] if no improvement after you complete antibiotic therapy.   Urinary Tract Infection, Adult A urinary tract infection (UTI) is an infection of any part of the urinary tract. The urinary tract includes:  The kidneys.  The ureters.  The bladder.  The urethra. These organs make, store, and get rid of pee (urine) in the body. What are the causes? This infection is caused by germs (bacteria) in your genital area. These germs grow and cause swelling (inflammation) of your urinary tract. What increases the risk? The following factors may make you more likely to develop this condition:  Using a small, thin tube (catheter) to drain pee.  Not being able to control when you pee or poop (incontinence).  Being female. If you are female, these things can increase the risk: ? Using these methods to prevent pregnancy:  A medicine that kills sperm (spermicide).  A device that blocks sperm (diaphragm). ? Having low levels of a female hormone (estrogen). ? Being pregnant. You are more likely to develop this condition if:  You have genes that add to your risk.  You are sexually active.  You take antibiotic medicines.  You have trouble peeing because of: ? A prostate that is bigger than normal, if you are female. ? A blockage in the part of your body that drains pee from the bladder. ? A kidney stone. ? A nerve condition that affects your bladder. ? Not getting enough to drink. ? Not peeing often enough.  You have other conditions, such as: ? Diabetes. ? A weak disease-fighting system (immune system). ? Sickle cell disease. ? Gout. ? Injury of the spine. What are the signs or symptoms? Symptoms of this condition include:  Needing to pee right away.  Peeing small amounts often.  Pain or burning when peeing.  Blood in the pee.  Pee that smells bad or not like normal.  Trouble  peeing.  Pee that is cloudy.  Fluid coming from the vagina, if you are female.  Pain in the belly or lower back. Other symptoms include:  Vomiting.  Not feeling hungry.  Feeling mixed up (confused). This may be the first symptom in older adults.  Being tired and grouchy (irritable).  A fever.  Watery poop (diarrhea). How is this treated?  Taking antibiotic medicine.  Taking other medicines.  Drinking enough water. In some cases, you may need to see a specialist. Follow these instructions at home: Medicines  Take over-the-counter and prescription medicines only as told by your doctor.  If you were prescribed an antibiotic medicine, take it as told by your doctor. Do not stop taking it even if you start to feel better. General instructions  Make sure you: ? Pee until your bladder is empty. ? Do not hold pee for a long time. ? Empty your bladder after sex. ? Wipe from front to back after peeing or pooping if you are a female. Use each tissue one time when you wipe.  Drink enough fluid to keep your pee pale yellow.  Keep all follow-up visits.   Contact a doctor if:  You do not get better after 1-2 days.  Your symptoms go away and then come back. Get help right away if:  You have very bad back pain.  You have very bad pain in your lower belly.  You have a fever.  You have chills.  You  feeling like you will vomit or you vomit. Summary  A urinary tract infection (UTI) is an infection of any part of the urinary tract.  This condition is caused by germs in your genital area.  There are many risk factors for a UTI.  Treatment includes antibiotic medicines.  Drink enough fluid to keep your pee pale yellow. This information is not intended to replace advice given to you by your health care provider. Make sure you discuss any questions you have with your health care provider. Document Revised: 01/03/2020 Document Reviewed: 01/03/2020 Elsevier Patient  Education  [place].

## 2020-08-16 LAB — URINALYSIS, ROUTINE W REFLEX MICROSCOPIC
Bacteria, UA: NONE SEEN /HPF
Bilirubin Urine: NEGATIVE
Glucose, UA: NEGATIVE
Hyaline Cast: NONE SEEN /LPF
Ketones, ur: NEGATIVE
Nitrite: NEGATIVE
Protein, ur: NEGATIVE
Specific Gravity, Urine: 1.004 (ref 1.001–1.03)
Squamous Epithelial / HPF: NONE SEEN /HPF (ref <=5)
pH: 7 (ref 5.0–8.0)

## 2020-08-16 LAB — URINE CULTURE
MICRO NUMBER:: 11637753
SPECIMEN QUALITY:: ADEQUATE

## 2020-08-19 DIAGNOSIS — Z96651 Presence of right artificial knee joint: Secondary | ICD-10-CM | POA: Diagnosis not present

## 2020-09-07 ENCOUNTER — Ambulatory Visit: Payer: PPO | Admitting: Neurology

## 2020-09-07 ENCOUNTER — Encounter: Payer: Self-pay | Admitting: Neurology

## 2020-09-07 ENCOUNTER — Other Ambulatory Visit: Payer: Self-pay

## 2020-09-07 VITALS — BP 173/93 | HR 77 | Ht 59.0 in | Wt 130.0 lb

## 2020-09-07 DIAGNOSIS — M48061 Spinal stenosis, lumbar region without neurogenic claudication: Secondary | ICD-10-CM | POA: Diagnosis not present

## 2020-09-07 DIAGNOSIS — M4807 Spinal stenosis, lumbosacral region: Secondary | ICD-10-CM
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
## 2020-09-07 NOTE — Patient Instructions (Signed)
We will order MRI lumbar spine without contrast.

## 2020-09-15 ENCOUNTER — Other Ambulatory Visit: Payer: Self-pay | Admitting: Internal Medicine

## 2020-09-15 ENCOUNTER — Telehealth: Payer: Self-pay

## 2020-09-15 DIAGNOSIS — K136 Irritative hyperplasia of oral mucosa: Secondary | ICD-10-CM | POA: Diagnosis not present

## 2020-09-15 NOTE — Telephone Encounter (Signed)
Patient submitted blood pressure reading for PCP to review. Blood pressure readings placed in red folder.

## 2020-09-21 ENCOUNTER — Ambulatory Visit: Payer: PPO | Admitting: Internal Medicine

## 2020-09-22 ENCOUNTER — Ambulatory Visit (INDEPENDENT_AMBULATORY_CARE_PROVIDER_SITE_OTHER): Payer: PPO | Admitting: Internal Medicine

## 2020-09-22 ENCOUNTER — Encounter: Payer: Self-pay | Admitting: Internal Medicine

## 2020-09-22 ENCOUNTER — Other Ambulatory Visit: Payer: Self-pay

## 2020-09-22 VITALS — BP 154/86 | HR 78 | Temp 97.6°F | Ht 59.0 in | Wt 131.6 lb

## 2020-09-22 DIAGNOSIS — M170 Bilateral primary osteoarthritis of knee: Secondary | ICD-10-CM | POA: Diagnosis not present

## 2020-09-22 DIAGNOSIS — G609 Hereditary and idiopathic neuropathy, unspecified: Secondary | ICD-10-CM | POA: Diagnosis not present

## 2020-09-22 DIAGNOSIS — Z79899 Other long term (current) drug therapy: Secondary | ICD-10-CM | POA: Diagnosis not present

## 2020-09-22 DIAGNOSIS — I10 Essential (primary) hypertension: Secondary | ICD-10-CM | POA: Diagnosis not present

## 2020-09-22 DIAGNOSIS — J479 Bronchiectasis, uncomplicated: Secondary | ICD-10-CM

## 2020-09-22 DIAGNOSIS — G4700 Insomnia, unspecified: Secondary | ICD-10-CM | POA: Diagnosis not present

## 2020-09-22 DIAGNOSIS — Z01818 Encounter for other preprocedural examination: Secondary | ICD-10-CM

## 2020-09-22 MED ORDER — AMLODIPINE BESYLATE 5 MG PO TABS: 5.0000 mg | ORAL_TABLET | Freq: Every day | ORAL | 1 refills | Status: DC

## 2020-09-22 NOTE — Patient Instructions (Addendum)
We need to add Bp medication for control .  5 mg per day  ( can take 1/2 of 2.5 mg per day    But may not be strong enough)  I advise getting to 150 and below range before surgery .   Your baseline ekg has some abnormalities but this may be old changes   Will review your old ekgs.   Lab today.
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
## 2020-09-23 ENCOUNTER — Other Ambulatory Visit: Payer: Self-pay

## 2020-09-23 ENCOUNTER — Other Ambulatory Visit: Payer: PPO

## 2020-09-23 ENCOUNTER — Other Ambulatory Visit: Payer: Self-pay | Admitting: Internal Medicine

## 2020-09-23 LAB — LIPID PANEL
Cholesterol: 230 mg/dL — ABNORMAL HIGH (ref 0–200)
HDL: 62.4 mg/dL (ref >39.00)
LDL Cholesterol: 141 mg/dL — ABNORMAL HIGH (ref 0–99)
NonHDL: 167.88
Total CHOL/HDL Ratio: 4
Triglycerides: 134 mg/dL (ref 0.0–149.0)
VLDL: 26.8 mg/dL (ref 0.0–40.0)

## 2020-09-23 LAB — BASIC METABOLIC PANEL
BUN: 16 mg/dL (ref 6–23)
CO2: 27 mEq/L (ref 19–32)
Calcium: 9.4 mg/dL (ref 8.4–10.5)
Chloride: 104 mEq/L (ref 96–112)
Creatinine, Ser: 0.74 mg/dL (ref 0.40–1.20)
GFR: 76.55 mL/min (ref >60.00)
Glucose, Bld: 89 mg/dL (ref 70–99)
Potassium: 4.7 mEq/L (ref 3.5–5.1)
Sodium: 140 mEq/L (ref 135–145)

## 2020-09-23 LAB — HEPATIC FUNCTION PANEL
ALT: 21 U/L (ref 0–35)
AST: 22 U/L (ref 0–37)
Albumin: 4.1 g/dL (ref 3.5–5.2)
Alkaline Phosphatase: 54 U/L (ref 39–117)
Bilirubin, Direct: 0.1 mg/dL (ref 0.0–0.3)
Total Bilirubin: 0.5 mg/dL (ref 0.2–1.2)
Total Protein: 6.5 g/dL (ref 6.0–8.3)

## 2020-09-23 LAB — POC URINALSYSI DIPSTICK (AUTOMATED)
Bilirubin, UA: NEGATIVE
Blood, UA: NEGATIVE
Glucose, UA: NEGATIVE
Ketones, UA: NEGATIVE
Leukocytes, UA: NEGATIVE
Nitrite, UA: NEGATIVE
Protein, UA: NEGATIVE
Spec Grav, UA: 1.02 (ref 1.010–1.025)
Urobilinogen, UA: 0.2 E.U./dL
pH, UA: 5.5 (ref 5.0–8.0)

## 2020-09-23 LAB — CBC WITH DIFFERENTIAL/PLATELET
Basophils Absolute: 0.1 10*3/uL (ref 0.0–0.1)
Basophils Relative: 1 % (ref 0.0–3.0)
Eosinophils Absolute: 0.2 10*3/uL (ref 0.0–0.7)
Eosinophils Relative: 3 % (ref 0.0–5.0)
HCT: 40.7 % (ref 36.0–46.0)
Hemoglobin: 13.7 g/dL (ref 12.0–15.0)
Lymphocytes Relative: 41.7 % (ref 12.0–46.0)
Lymphs Abs: 2.2 10*3/uL (ref 0.7–4.0)
MCHC: 33.6 g/dL (ref 30.0–36.0)
MCV: 93.2 fl (ref 78.0–100.0)
Monocytes Absolute: 0.5 10*3/uL (ref 0.1–1.0)
Monocytes Relative: 9.2 % (ref 3.0–12.0)
Neutro Abs: 2.4 10*3/uL (ref 1.4–7.7)
Neutrophils Relative %: 45.1 % (ref 43.0–77.0)
Platelets: 242 10*3/uL (ref 150.0–400.0)
RBC: 4.37 Mil/uL (ref 3.87–5.11)
RDW: 13.6 % (ref 11.5–15.5)
WBC: 5.3 10*3/uL (ref 4.0–10.5)

## 2020-09-23 LAB — TSH: TSH: 1.37 u[IU]/mL (ref 0.35–4.50)

## 2020-09-23 LAB — HEMOGLOBIN A1C: Hgb A1c MFr Bld: 5.5 % (ref 4.6–6.5)

## 2020-09-23 NOTE — Telephone Encounter (Signed)
Pharmacy is requesting an alternative to Gabapentin

## 2020-09-24 ENCOUNTER — Other Ambulatory Visit: Payer: Self-pay

## 2020-09-24 ENCOUNTER — Ambulatory Visit
Admission: RE | Admit: 2020-09-24 | Discharge: 2020-09-24 | Disposition: A | Discharge status: Home / Self Care | Payer: PPO | Source: Ambulatory Visit | Attending: Neurology | Admitting: Neurology

## 2020-09-24 DIAGNOSIS — M48061 Spinal stenosis, lumbar region without neurogenic claudication: Secondary | ICD-10-CM | POA: Diagnosis not present

## 2020-09-24 DIAGNOSIS — M545 Low back pain, unspecified: Secondary | ICD-10-CM | POA: Diagnosis not present

## 2020-09-24 DIAGNOSIS — M4807 Spinal stenosis, lumbosacral region: Secondary | ICD-10-CM

## 2020-09-25 ENCOUNTER — Other Ambulatory Visit: Payer: Self-pay | Admitting: Internal Medicine

## 2020-09-25 ENCOUNTER — Telehealth: Payer: Self-pay | Admitting: Neurology

## 2020-09-25 DIAGNOSIS — Z20822 Contact with and (suspected) exposure to covid-19: Secondary | ICD-10-CM | POA: Diagnosis not present

## 2020-09-25 NOTE — Telephone Encounter (Signed)
See ov.

## 2020-09-25 NOTE — Telephone Encounter (Signed)
Called and informed patient that MRI lumbar spine shows lumbar spondylosis with disc protrusion at the left L4-5 level causing severe foraminal stenosis and lateral recess stenosis.  Findings would explain her left leg dysesthesias.    Management options discussed including PT. She is going on a cruise for the next two weeks and once she returns, will be having left knee surgery on 5/9, so declined PT.  She would like to recover from surgery and then follow-up, if her leg continues to bother her.   Deadra Diggins K. Posey Pronto, DO

## 2020-09-29 NOTE — Progress Notes (Signed)
Results stable or normal forwarding to    Dr Maureen Ralphs

## 2020-09-30 ENCOUNTER — Telehealth: Payer: Self-pay

## 2020-09-30 NOTE — Chronic Care Management (AMB) (Signed)
Chronic Care Management Pharmacy Assistant   Name: Julie Bonilla  MRN: [id number] DOB: 1940-11-07  Reason for Encounter: Disease State/ General Assessment Call   Conditions to be addressed/monitored: HTN and HLD  Recent office visits:   07/21/20 Julie Bonilla. Panosh MD (PCP) - seen for hypertension, medication management, sleep disorder, osteoarthritis of both knees. Added mirtazipine 7.5mg  daily at bedtime and changed losartan potassium to 100mg  daily. Patient instructed to follow up in 2 months.  08/14/20 Julie Hau MD (PCP) - seen for Hematuria and acute cystitis. Started on Sulfamethoxazole- Trimethoprim 800-160mg  twice daily for 7 days. Patient instructed to follow up if no improvement or worsening of symptoms.  09/22/20 Julie Bonilla. Panosh MD (PCP) - seen for osteoarthritis of both knees, hypertension, pre-op evaluation, peripheral neuropathy, medication management, bronchiectasis and insomnia. Added amlodipine 5mg  once daily. Follow up in 1-2 months.    Recent consult visits:   09/07/20 Julie K. Posey Pronto DO (Neurology) - seen for spinal stenosis. No medication changes. MRI ordered. No follow up noted.  09/15/20 Julie H. Rosen MD (Otolaryngology) - seen for mouth irritation in the mornings. No medication changes. Follow up in two months of not better.  Hospital visits:  None in previous 6 months   09/30/2020 Name: Julie Bonilla MRN: [id number] DOB: [birth year] Julie Bonilla is a 80 y.o. year old female who is a primary care patient of Julie Bonilla, Julie Brooking, MD.  Comprehensive medication review performed; Spoke to patient regarding cholesterol  Lipid Panel    Component Value Date/Time   CHOL 230 (H) 09/22/2020 1613   TRIG 134.0 09/22/2020 1613   HDL 62.40 09/22/2020 1613   LDLCALC 141 (H) 09/22/2020 1613   LDLCALC 152 (H) 12/30/2019 1003   LDLDIRECT 149.0 05/15/2019 1551    10-year ASCVD risk score: The ASCVD Risk score Julie Bussing DC Jr., et al., 2013) failed to calculate  for the following reasons:   The 2013 ASCVD risk score is only valid for ages 32 to 91  . Current antihyperlipidemic regimen:  o ezetemibe 10mg  - once daily.  . Previous antihyperlipidemic medications tried: rosuvastatin 5mg  and pravastatin.  Marland Kitchen ASCVD risk enhancing conditions: HTN  . What recent interventions/DTPs have been made by any provider to improve Cholesterol control since last CPP Visit: none noted.   . Any recent hospitalizations or ED visits since last visit with CPP? No   Adherence Review: Does the patient have >5 day gap between last estimated fill dates? No   Reviewed chart prior to disease state call. Spoke with patient regarding BP  Recent Office Vitals: BP Readings from Last 3 Encounters:  09/22/20 (!) 154/86  09/07/20 (!) 173/93  08/14/20 140/72   Pulse Readings from Last 3 Encounters:  09/22/20 78  09/07/20 77  08/14/20 83    Wt Readings from Last 3 Encounters:  09/22/20 131 lb 9.6 oz (59.7 kg)  09/07/20 130 lb (59 kg)  08/14/20 132 lb 11.2 oz (60.2 kg)     Kidney Function Lab Results  Component Value Date/Time   CREATININE 0.74 09/22/2020 04:13 PM   CREATININE 0.84 12/30/2019 10:03 AM   CREATININE 0.67 07/30/2019 03:56 AM   GFR 76.55 09/22/2020 04:13 PM   GFRNONAA >60 07/30/2019 03:56 AM   GFRAA >60 07/30/2019 03:56 AM    BMP Latest Ref Rng & Units 09/22/2020 12/30/2019 07/30/2019  Glucose 70 - 99 mg/dL 89 79 113(H)  BUN 6 - 23 mg/dL 16 15 15   Creatinine 0.40 - 1.20 mg/dL  0.74 0.84 0.67  BUN/Creat Ratio 6 - 22 (calc) - NOT APPLICABLE -  Sodium 062 - 145 mEq/L 140 142 140  Potassium 3.5 - 5.1 mEq/L 4.7 4.2 4.2  Chloride 96 - 112 mEq/L 104 106 109  CO2 19 - 32 mEq/L 27 26 26   Calcium 8.4 - 10.5 mg/dL 9.4 9.1 7.9(L)    . Current antihypertensive regimen:  o Losartan 100mg  - once daily. o Amlodipine 5mg  - once daily    . What recent interventions/DTPs have been made by any provider to improve Blood Pressure control since last CPP Visit:  Yes. Added amlodipine 5mg  once daily.  . Any recent hospitalizations or ED visits since last visit with CPP? No   Adherence Review: Is the patient currently on ACE/ARB medication? Yes Does the patient have >5 day gap between last estimated fill dates? No   Stephannie Peters, CMA   Medications: Outpatient Encounter Medications as of 09/30/2020  Medication Sig  . amLODipine (NORVASC) 5 MG tablet Take 1 tablet (5 mg total) by mouth daily.  . cetirizine (ZYRTEC) 10 MG tablet Take 10 mg by mouth daily as needed for allergies.   . chlorhexidine (PERIDEX) 0.12 % solution SMARTSIG:By Mouth  . clotrimazole (MYCELEX) 10 MG troche Take 1 tablet (10 mg total) by mouth 5 (five) times daily.  . diclofenac Sodium (VOLTAREN) 1 % GEL diclofenac 1 % topical gel  Apply 4 g by topical route.  . DULoxetine (CYMBALTA) 60 MG capsule TAKE ONE CAPSULE BY MOUTH TWICE DAILY  . ezetimibe (ZETIA) 10 MG tablet TAKE ONE TABLET BY MOUTH ONE TIME DAILY  . fluticasone (FLONASE) 50 MCG/ACT nasal spray Place 2 sprays into both nostrils daily.  Marland Kitchen gabapentin (NEURONTIN) 300 MG capsule take 1 capsule by mouth in the morning and 2 capsules at bedtime or as directed  . losartan (COZAAR) 100 MG tablet Take 1 tablet (100 mg total) by mouth daily.  . methocarbamol (ROBAXIN) 500 MG tablet Take 1 tablet (500 mg total) by mouth every 6 (six) hours as needed for muscle spasms.  . mirtazapine (REMERON) 7.5 MG tablet Take 1 tablet (7.5 mg total) by mouth at bedtime. May increase to 15 mg at night  . Multiple Vitamin (MULTIVITAMIN ADULT PO) Take 1 tablet by mouth daily.  . polyethylene glycol powder (GLYCOLAX/MIRALAX) 17 GM/SCOOP powder 1 cap full in a full glass of water, two times a day for 3 days.  . risedronate (ACTONEL) 35 MG tablet take 1 tablet by mouth weekly with water on an empty stomach **do not eat drink or lie down for the next 48mins  . senna-docusate (SENOKOT-S) 8.6-50 MG tablet Take 2 tablets by mouth 2 (two) times daily.   Marland Kitchen zolpidem (AMBIEN) 5 MG tablet TAKE ONE TABLET BY MOUTH AT BEDTIME AS NEEDED for sleep *avoid regular use*   No facility-administered encounter medications on file as of 09/30/2020.    Star Rating Drugs:  Losartan 100mg  - 07/21/20 90DS at [place] [phone]

## 2020-10-07 ENCOUNTER — Other Ambulatory Visit (HOSPITAL_COMMUNITY): Payer: PPO
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
## 2020-10-07 NOTE — Patient Instructions (Signed)
DUE TO COVID-19 ONLY ONE VISITOR IS ALLOWED TO COME WITH YOU AND STAY IN THE WAITING ROOM ONLY DURING PRE OP AND PROCEDURE DAY OF SURGERY. THE 1 VISITOR  MAY VISIT WITH YOU AFTER SURGERY IN YOUR PRIVATE ROOM DURING VISITING HOURS ONLY!  YOU NEED TO HAVE A COVID 19 TEST ON_5/5______ @12 :45 pm_______, THIS TEST MUST BE DONE BEFORE SURGERY,  COVID TESTING SITE [place] [address], IT IS ON THE RIGHT GOING OUT [address] APPROXIMATELY  2 MINUTES PAST [employer] ON THE RIGHT. ONCE YOUR COVID TEST IS COMPLETED,  PLEASE BEGIN THE QUARANTINE INSTRUCTIONS AS OUTLINED IN YOUR HANDOUT.                [name]   Your procedure is scheduled on: 10/12/20   Report to [facility] Main  Entrance   Report to admitting at   10:10 AM     Call this number if you have problems the morning of surgery [phone]    . BRUSH YOUR TEETH MORNING OF SURGERY AND RINSE YOUR MOUTH OUT, NO CHEWING GUM CANDY OR MINTS.   No food after midnight.    You may have clear liquid until 9:30 AM    At 9:00 AM drink pre surgery drink  . Nothing by mouth after 9:30 AM.   Take these medicines the morning of surgery with A SIP OF WATER: Gabapentin, Cymbalta, Amlodipine, Flonase, Zyrtec                                 You may not have any metal on your body including hair pins and              piercings  Do not wear jewelry, make-up, lotions, powders or perfumes, deodorant             Do not wear nail polish on your fingernails.  Do not shave  48 hours prior to surgery.     Do not bring valuables to the hospital. [place].  Contacts, dentures or bridgework may not be worn into surgery.                  [facility] - Preparing for Surgery Before surgery, you can play an important role.  Because skin is not sterile, your skin needs to be as free of germs as possible.  You can reduce the number of germs on your skin by  washing with CHG (chlorahexidine gluconate) soap before surgery.  CHG is an antiseptic cleaner which kills germs and bonds with the skin to continue killing germs even after washing. Please DO NOT use if you have an allergy to CHG or antibacterial soaps.  If your skin becomes reddened/irritated stop using the CHG and inform your nurse when you arrive at Short Stay. Do not shave (including legs and underarms) for at least 48 hours prior to the first CHG shower.  Please follow these instructions carefully:  1.  Shower with CHG Soap the night before surgery and the  morning of Surgery.  2.  If you choose to wash your hair, wash your hair first as usual with your  normal  shampoo.  3.  After you shampoo, rinse your hair and body thoroughly to remove the  shampoo.  4.  Use CHG as you would any other liquid soap.  You can apply chg directly  to the skin and wash                       Gently with a scrungie or clean washcloth.  5.  Apply the CHG Soap to your body ONLY FROM THE NECK DOWN.   Do not use on face/ open                           Wound or open sores. Avoid contact with eyes, ears mouth and genitals (private parts).                       Wash face,  Genitals (private parts) with your normal soap.             6.  Wash thoroughly, paying special attention to the area where your surgery  will be performed.  7.  Thoroughly rinse your body with warm water from the neck down.  8.  DO NOT shower/wash with your normal soap after using and rinsing off  the CHG Soap.             9.  Pat yourself dry with a clean towel.            10.  Wear clean pajamas.            11.  Place clean sheets on your bed the night of your first shower and do not  sleep with pets. Day of Surgery : Do not apply any lotions/deodorants the morning of surgery.  Please wear clean clothes to the hospital/surgery center.  FAILURE TO FOLLOW THESE INSTRUCTIONS MAY RESULT IN THE CANCELLATION OF  YOUR SURGERY PATIENT SIGNATURE_________________________________  NURSE SIGNATURE__________________________________  ________________________________________________________________________   Adam Phenix  An incentive spirometer is a tool that can help keep your lungs clear and active. This tool measures how well you are filling your lungs with each breath. Taking long deep breaths may help reverse or decrease the chance of developing breathing (pulmonary) problems (especially infection) following:  A long period of time when you are unable to move or be active. BEFORE THE PROCEDURE   If the spirometer includes an indicator to show your best effort, your nurse or respiratory therapist will set it to a desired goal.  If possible, sit up straight or lean slightly forward. Try not to slouch.  Hold the incentive spirometer in an upright position. INSTRUCTIONS FOR USE  1. Sit on the edge of your bed if possible, or sit up as far as you can in bed or on a chair. 2. Hold the incentive spirometer in an upright position. 3. Breathe out normally. 4. Place the mouthpiece in your mouth and seal your lips tightly around it. 5. Breathe in slowly and as deeply as possible, raising the piston or the ball toward the top of the column. 6. Hold your breath for 3-5 seconds or for as long as possible. Allow the piston or ball to fall to the bottom of the column. 7. Remove the mouthpiece from your mouth and breathe out normally. 8. Rest for a few seconds and repeat Steps 1 through 7 at least 10 times every 1-2 hours when you are awake. Take your time and take a few normal breaths between deep breaths. 9. The spirometer may include an indicator to show your best effort.  Use the indicator as a goal to work toward during each repetition. 10. After each set of 10 deep breaths, practice coughing to be sure your lungs are clear. If you have an incision (the cut made at the time of surgery), support your  incision when coughing by placing a pillow or rolled up towels firmly against it. Once you are able to get out of bed, walk around indoors and cough well. You may stop using the incentive spirometer when instructed by your caregiver.  RISKS AND COMPLICATIONS  Take your time so you do not get dizzy or light-headed.  If you are in pain, you may need to take or ask for pain medication before doing incentive spirometry. It is harder to take a deep breath if you are having pain. AFTER USE  Rest and breathe slowly and easily.  It can be helpful to keep track of a log of your progress. Your caregiver can provide you with a simple table to help with this. If you are using the spirometer at home, follow these instructions: [place] IF:   You are having difficultly using the spirometer.  You have trouble using the spirometer as often as instructed.  Your pain medication is not giving enough relief while using the spirometer.  You develop fever of 100.5 F (38.1 C) or higher. SEEK IMMEDIATE MEDICAL CARE IF:   You cough up bloody sputum that had not been present before.  You develop fever of 102 F (38.9 C) or greater.  You develop worsening pain at or near the incision site. MAKE SURE YOU:   Understand these instructions.  Will watch your condition.  Will get help right away if you are not doing well or get worse. Document Released: 10/03/2006 Document Revised: 08/15/2011 Document Reviewed: 12/04/2006 [facility] Patient Information 2014 [place], [place].   ________________________________________________________________________

## 2020-10-08 ENCOUNTER — Other Ambulatory Visit: Payer: Self-pay

## 2020-10-08 ENCOUNTER — Encounter (HOSPITAL_COMMUNITY)
Admission: RE | Admit: 2020-10-08 | Discharge: 2020-10-08 | Disposition: A | Discharge status: Home / Self Care | Payer: PPO | Source: Ambulatory Visit | Attending: Orthopedic Surgery | Admitting: Orthopedic Surgery

## 2020-10-08 ENCOUNTER — Other Ambulatory Visit (HOSPITAL_COMMUNITY): Payer: PPO

## 2020-10-08 ENCOUNTER — Encounter (HOSPITAL_COMMUNITY): Payer: Self-pay

## 2020-10-08 DIAGNOSIS — Z01812 Encounter for preprocedural laboratory examination: Secondary | ICD-10-CM | POA: Diagnosis not present

## 2020-10-08 HISTORY — DX: Family history of other specified conditions: Z84.89

## 2020-10-08 LAB — COMPREHENSIVE METABOLIC PANEL
ALT: 21 U/L (ref 0–44)
AST: 21 U/L (ref 15–41)
Albumin: 4 g/dL (ref 3.5–5.0)
Alkaline Phosphatase: 53 U/L (ref 38–126)
Anion gap: 8 (ref 5–15)
BUN: 20 mg/dL (ref 8–23)
CO2: 25 mmol/L (ref 22–32)
Calcium: 8.8 mg/dL — ABNORMAL LOW (ref 8.9–10.3)
Chloride: 106 mmol/L (ref 98–111)
Creatinine, Ser: 0.68 mg/dL (ref 0.44–1.00)
GFR, Estimated: >60 mL/min (ref >60)
Glucose, Bld: 107 mg/dL — ABNORMAL HIGH (ref 70–99)
Potassium: 3.9 mmol/L (ref 3.5–5.1)
Sodium: 139 mmol/L (ref 135–145)
Total Bilirubin: 0.7 mg/dL (ref 0.3–1.2)
Total Protein: 6.6 g/dL (ref 6.5–8.1)

## 2020-10-08 LAB — SURGICAL PCR SCREEN
MRSA, PCR: NEGATIVE
Staphylococcus aureus: NEGATIVE

## 2020-10-08 LAB — CBC
HCT: 42.5 % (ref 36.0–46.0)
Hemoglobin: 13.8 g/dL (ref 12.0–15.0)
MCH: 31.2 pg (ref 26.0–34.0)
MCHC: 32.5 g/dL (ref 30.0–36.0)
MCV: 95.9 fL (ref 80.0–100.0)
Platelets: 248 10*3/uL (ref 150–400)
RBC: 4.43 MIL/uL (ref 3.87–5.11)
RDW: 13.2 % (ref 11.5–15.5)
WBC: 5.4 10*3/uL (ref 4.0–10.5)
nRBC: 0 % (ref 0.0–0.2)

## 2020-10-08 LAB — PROTIME-INR
INR: 1 (ref 0.8–1.2)
Prothrombin Time: 13 seconds (ref 11.4–15.2)

## 2020-10-08 LAB — APTT: aPTT: 32 seconds (ref 24–36)

## 2020-10-08 NOTE — Progress Notes (Signed)
COVID Vaccine Completed:Yes Date COVID Vaccine completed:07/04/19-Booster 04/08/20, 09/18/20 COVID vaccine manufacturer: Pfizer      PCP - Dr. Idell Pickles Cardiologist - no  Chest x-ray - no EKG - 09/22/20-epic and chart Stress Test - NA ECHO - NA Cardiac Cath - NA Pacemaker/ICD device last checked:NA  Sleep Study - no CPAP -   Fasting Blood Sugar - NA Checks Blood Sugar _____ times a day  Blood Thinner Instructions:NA Aspirin Instructions: Last Dose:  Anesthesia review:   Patient denies shortness of breath, fever, cough and chest pain at PAT appointment Pt reports no SOB climbing stairs, doing housework or with ADLs.  Patient verbalized understanding of instructions that were given to them at the PAT appointment. Patient was also instructed that they will need to review over the PAT instructions again at home before surgery.yes

## 2020-10-11 MED ORDER — BUPIVACAINE LIPOSOME 1.3 % IJ SUSP
20.0000 mL | INTRAMUSCULAR | Status: DC
  Filled 2020-10-11: qty 20

## 2020-10-12 ENCOUNTER — Encounter (HOSPITAL_COMMUNITY): Payer: Self-pay | Admitting: Orthopedic Surgery

## 2020-10-12 ENCOUNTER — Ambulatory Visit (HOSPITAL_COMMUNITY): Payer: PPO | Admitting: Anesthesiology

## 2020-10-12 ENCOUNTER — Encounter (HOSPITAL_COMMUNITY)
Admission: RE | Disposition: A | Discharge status: Home / Self Care | Payer: Self-pay | Source: Ambulatory Visit | Attending: Orthopedic Surgery

## 2020-10-12 ENCOUNTER — Other Ambulatory Visit: Payer: Self-pay

## 2020-10-12 ENCOUNTER — Observation Stay (HOSPITAL_COMMUNITY)
Admission: RE | Admit: 2020-10-12 | Discharge: 2020-10-13 | Disposition: A | Discharge status: Home / Self Care | Payer: PPO | Source: Ambulatory Visit | Attending: Orthopedic Surgery | Admitting: Orthopedic Surgery

## 2020-10-12 DIAGNOSIS — Z79899 Other long term (current) drug therapy: Secondary | ICD-10-CM | POA: Insufficient documentation

## 2020-10-12 DIAGNOSIS — Z85828 Personal history of other malignant neoplasm of skin: Secondary | ICD-10-CM | POA: Diagnosis not present

## 2020-10-12 DIAGNOSIS — Z20822 Contact with and (suspected) exposure to covid-19: Secondary | ICD-10-CM | POA: Insufficient documentation

## 2020-10-12 DIAGNOSIS — K219 Gastro-esophageal reflux disease without esophagitis: Secondary | ICD-10-CM | POA: Diagnosis not present

## 2020-10-12 DIAGNOSIS — Z96651 Presence of right artificial knee joint: Secondary | ICD-10-CM | POA: Insufficient documentation

## 2020-10-12 DIAGNOSIS — I10 Essential (primary) hypertension: Secondary | ICD-10-CM | POA: Insufficient documentation

## 2020-10-12 DIAGNOSIS — G8918 Other acute postprocedural pain: Secondary | ICD-10-CM | POA: Diagnosis not present

## 2020-10-12 DIAGNOSIS — M1712 Unilateral primary osteoarthritis, left knee: Principal | ICD-10-CM | POA: Insufficient documentation

## 2020-10-12 DIAGNOSIS — E785 Hyperlipidemia, unspecified: Secondary | ICD-10-CM | POA: Diagnosis not present

## 2020-10-12 HISTORY — PX: TOTAL KNEE ARTHROPLASTY: SHX125

## 2020-10-12 LAB — TYPE AND SCREEN
ABO/RH(D): O NEG
Antibody Screen: NEGATIVE

## 2020-10-12 LAB — SARS CORONAVIRUS 2 BY RT PCR (HOSPITAL ORDER, PERFORMED IN ~~LOC~~ HOSPITAL LAB): SARS Coronavirus 2: NEGATIVE

## 2020-10-12 SURGERY — ARTHROPLASTY, KNEE, TOTAL
Anesthesia: Spinal | Site: Knee | Laterality: Left

## 2020-10-12 MED ORDER — POVIDONE-IODINE 10 % EX SWAB: 2.0000 "application " | Freq: Once | CUTANEOUS | Status: DC

## 2020-10-12 MED ORDER — FLEET ENEMA 7-19 GM/118ML RE ENEM: 1.0000 | ENEMA | Freq: Once | RECTAL | Status: DC | PRN

## 2020-10-12 MED ORDER — BUPIVACAINE LIPOSOME 1.3 % IJ SUSP
INTRAMUSCULAR | Status: DC | PRN
  Administered 2020-10-12: 20 mL

## 2020-10-12 MED ORDER — GABAPENTIN 300 MG PO CAPS
600.0000 mg | ORAL_CAPSULE | Freq: Every day | ORAL | Status: DC
  Administered 2020-10-12: 600 mg via ORAL
  Filled 2020-10-12: qty 2

## 2020-10-12 MED ORDER — LACTATED RINGERS IV SOLN: INTRAVENOUS | Status: DC

## 2020-10-12 MED ORDER — PHENYLEPHRINE 40 MCG/ML (10ML) SYRINGE FOR IV PUSH (FOR BLOOD PRESSURE SUPPORT)
PREFILLED_SYRINGE | INTRAVENOUS | Status: DC | PRN
  Administered 2020-10-12 (×2): 80 ug via INTRAVENOUS

## 2020-10-12 MED ORDER — PHENYLEPHRINE HCL-NACL 10-0.9 MG/250ML-% IV SOLN
INTRAVENOUS | Status: DC | PRN
  Administered 2020-10-12: 80 ug/min via INTRAVENOUS

## 2020-10-12 MED ORDER — AMLODIPINE BESYLATE 5 MG PO TABS
5.0000 mg | ORAL_TABLET | Freq: Every morning | ORAL | Status: DC
  Administered 2020-10-13: 5 mg via ORAL
  Filled 2020-10-12 (×2): qty 1

## 2020-10-12 MED ORDER — DULOXETINE HCL 60 MG PO CPEP
60.0000 mg | ORAL_CAPSULE | Freq: Two times a day (BID) | ORAL | Status: DC
  Administered 2020-10-12 – 2020-10-13 (×2): 60 mg via ORAL
  Filled 2020-10-12 (×2): qty 1

## 2020-10-12 MED ORDER — METOCLOPRAMIDE HCL 5 MG/ML IJ SOLN: 5.0000 mg | Freq: Three times a day (TID) | INTRAMUSCULAR | Status: DC | PRN

## 2020-10-12 MED ORDER — METHOCARBAMOL 500 MG PO TABS
500.0000 mg | ORAL_TABLET | Freq: Four times a day (QID) | ORAL | Status: DC | PRN
  Administered 2020-10-12 – 2020-10-13 (×3): 500 mg via ORAL
  Filled 2020-10-12 (×3): qty 1

## 2020-10-12 MED ORDER — OXYCODONE HCL 5 MG PO TABS
10.0000 mg | ORAL_TABLET | ORAL | Status: DC | PRN
  Administered 2020-10-12 – 2020-10-13 (×2): 10 mg via ORAL
  Filled 2020-10-12 (×2): qty 2

## 2020-10-12 MED ORDER — EPHEDRINE SULFATE-NACL 50-0.9 MG/10ML-% IV SOSY
PREFILLED_SYRINGE | INTRAVENOUS | Status: DC | PRN
  Administered 2020-10-12 (×3): 10 mg via INTRAVENOUS
  Administered 2020-10-12: 15 mg via INTRAVENOUS

## 2020-10-12 MED ORDER — CHLORHEXIDINE GLUCONATE 0.12 % MT SOLN: 15.0000 mL | Freq: Once | OROMUCOSAL | Status: DC

## 2020-10-12 MED ORDER — BUPIVACAINE IN DEXTROSE 0.75-8.25 % IT SOLN
INTRATHECAL | Status: DC | PRN
  Administered 2020-10-12: 1.4 mL via INTRATHECAL

## 2020-10-12 MED ORDER — METHOCARBAMOL 1000 MG/10ML IJ SOLN
500.0000 mg | Freq: Four times a day (QID) | INTRAVENOUS | Status: DC | PRN
  Filled 2020-10-12: qty 5

## 2020-10-12 MED ORDER — LOSARTAN POTASSIUM 50 MG PO TABS
100.0000 mg | ORAL_TABLET | Freq: Every morning | ORAL | Status: DC
  Administered 2020-10-13: 100 mg via ORAL
  Filled 2020-10-12 (×2): qty 2

## 2020-10-12 MED ORDER — CEFAZOLIN SODIUM-DEXTROSE 2-4 GM/100ML-% IV SOLN
2.0000 g | INTRAVENOUS | Status: AC
  Administered 2020-10-12: 2 g via INTRAVENOUS
  Filled 2020-10-12: qty 100

## 2020-10-12 MED ORDER — BISACODYL 10 MG RE SUPP: 10.0000 mg | Freq: Every day | RECTAL | Status: DC | PRN

## 2020-10-12 MED ORDER — PANTOPRAZOLE SODIUM 40 MG PO TBEC
40.0000 mg | DELAYED_RELEASE_TABLET | Freq: Every day | ORAL | Status: DC
  Administered 2020-10-12 – 2020-10-13 (×2): 40 mg via ORAL
  Filled 2020-10-12 (×2): qty 1

## 2020-10-12 MED ORDER — AMISULPRIDE (ANTIEMETIC) 5 MG/2ML IV SOLN: 10.0000 mg | Freq: Once | INTRAVENOUS | Status: DC | PRN

## 2020-10-12 MED ORDER — EZETIMIBE 10 MG PO TABS: 10.0000 mg | ORAL_TABLET | Freq: Every day | ORAL | Status: DC

## 2020-10-12 MED ORDER — ROPIVACAINE HCL 5 MG/ML IJ SOLN
INTRAMUSCULAR | Status: DC | PRN
  Administered 2020-10-12: 20 mL via PERINEURAL

## 2020-10-12 MED ORDER — PHENOL 1.4 % MT LIQD: 1.0000 | OROMUCOSAL | Status: DC | PRN

## 2020-10-12 MED ORDER — DEXAMETHASONE SODIUM PHOSPHATE 10 MG/ML IJ SOLN
10.0000 mg | Freq: Once | INTRAMUSCULAR | Status: AC
  Administered 2020-10-13: 10 mg via INTRAVENOUS
  Filled 2020-10-12: qty 1

## 2020-10-12 MED ORDER — MENTHOL 3 MG MT LOZG: 1.0000 | LOZENGE | OROMUCOSAL | Status: DC | PRN

## 2020-10-12 MED ORDER — ORAL CARE MOUTH RINSE: 15.0000 mL | Freq: Once | OROMUCOSAL | Status: DC

## 2020-10-12 MED ORDER — PROPOFOL 500 MG/50ML IV EMUL
INTRAVENOUS | Status: DC | PRN
  Administered 2020-10-12: 20 ug/kg/min via INTRAVENOUS
  Administered 2020-10-12: 50 mg via INTRAVENOUS
  Administered 2020-10-12: 60 mg via INTRAVENOUS
  Administered 2020-10-12 (×2): 20 mg via INTRAVENOUS

## 2020-10-12 MED ORDER — GABAPENTIN 300 MG PO CAPS
300.0000 mg | ORAL_CAPSULE | Freq: Every day | ORAL | Status: DC
  Administered 2020-10-13: 300 mg via ORAL
  Filled 2020-10-12: qty 1

## 2020-10-12 MED ORDER — SODIUM CHLORIDE (PF) 0.9 % IJ SOLN
INTRAMUSCULAR | Status: DC | PRN
  Administered 2020-10-12: 60 mL

## 2020-10-12 MED ORDER — OXYCODONE HCL 5 MG PO TABS
5.0000 mg | ORAL_TABLET | ORAL | Status: DC | PRN
  Administered 2020-10-13: 5 mg via ORAL
  Filled 2020-10-12 (×2): qty 1

## 2020-10-12 MED ORDER — POLYETHYLENE GLYCOL 3350 17 G PO PACK: 17.0000 g | PACK | Freq: Every day | ORAL | Status: DC | PRN

## 2020-10-12 MED ORDER — FENTANYL CITRATE (PF) 100 MCG/2ML IJ SOLN: 25.0000 ug | INTRAMUSCULAR | Status: DC | PRN

## 2020-10-12 MED ORDER — SODIUM CHLORIDE 0.9 % IR SOLN
Status: DC | PRN
  Administered 2020-10-12: 1000 mL

## 2020-10-12 MED ORDER — DEXAMETHASONE SODIUM PHOSPHATE 10 MG/ML IJ SOLN
8.0000 mg | Freq: Once | INTRAMUSCULAR | Status: AC
  Administered 2020-10-12: 8 mg via INTRAVENOUS

## 2020-10-12 MED ORDER — MIRTAZAPINE 15 MG PO TABS: 15.0000 mg | ORAL_TABLET | Freq: Every evening | ORAL | Status: DC | PRN

## 2020-10-12 MED ORDER — MORPHINE SULFATE (PF) 2 MG/ML IV SOLN
0.5000 mg | INTRAVENOUS | Status: DC | PRN
Start: 2020-10-12 — End: 2020-10-13

## 2020-10-12 MED ORDER — ONDANSETRON HCL 4 MG PO TABS: 4.0000 mg | ORAL_TABLET | Freq: Four times a day (QID) | ORAL | Status: DC | PRN

## 2020-10-12 MED ORDER — SODIUM CHLORIDE 0.9 % IV SOLN: INTRAVENOUS | Status: DC

## 2020-10-12 MED ORDER — FENTANYL CITRATE (PF) 100 MCG/2ML IJ SOLN
50.0000 ug | Freq: Once | INTRAMUSCULAR | Status: AC
  Administered 2020-10-12: 50 ug via INTRAVENOUS
  Filled 2020-10-12: qty 2

## 2020-10-12 MED ORDER — ACETAMINOPHEN 10 MG/ML IV SOLN
1000.0000 mg | Freq: Once | INTRAVENOUS | Status: AC
  Administered 2020-10-12: 1000 mg via INTRAVENOUS
  Filled 2020-10-12: qty 100

## 2020-10-12 MED ORDER — TRANEXAMIC ACID-NACL 1000-0.7 MG/100ML-% IV SOLN
1000.0000 mg | INTRAVENOUS | Status: AC
  Administered 2020-10-12: 1000 mg via INTRAVENOUS
  Filled 2020-10-12: qty 100

## 2020-10-12 MED ORDER — CEFAZOLIN SODIUM-DEXTROSE 2-4 GM/100ML-% IV SOLN
2.0000 g | Freq: Four times a day (QID) | INTRAVENOUS | Status: AC
  Administered 2020-10-12 – 2020-10-13 (×2): 2 g via INTRAVENOUS
  Filled 2020-10-12 (×2): qty 100

## 2020-10-12 MED ORDER — ONDANSETRON HCL 4 MG/2ML IJ SOLN
INTRAMUSCULAR | Status: DC | PRN
  Administered 2020-10-12: 4 mg via INTRAVENOUS

## 2020-10-12 MED ORDER — DIPHENHYDRAMINE HCL 25 MG PO CAPS
25.0000 mg | ORAL_CAPSULE | Freq: Every evening | ORAL | Status: DC | PRN
  Administered 2020-10-13: 25 mg via ORAL
  Filled 2020-10-12: qty 1

## 2020-10-12 MED ORDER — DOCUSATE SODIUM 100 MG PO CAPS
100.0000 mg | ORAL_CAPSULE | Freq: Two times a day (BID) | ORAL | Status: DC
  Administered 2020-10-12 – 2020-10-13 (×2): 100 mg via ORAL
  Filled 2020-10-12 (×2): qty 1

## 2020-10-12 MED ORDER — ASPIRIN EC 325 MG PO TBEC
325.0000 mg | DELAYED_RELEASE_TABLET | Freq: Two times a day (BID) | ORAL | Status: DC
  Administered 2020-10-13: 325 mg via ORAL
  Filled 2020-10-12: qty 1

## 2020-10-12 MED ORDER — DIPHENHYDRAMINE HCL 12.5 MG/5ML PO ELIX
12.5000 mg | ORAL_SOLUTION | ORAL | Status: DC | PRN
Start: 2020-10-12 — End: 2020-10-13

## 2020-10-12 MED ORDER — METOCLOPRAMIDE HCL 5 MG PO TABS: 5.0000 mg | ORAL_TABLET | Freq: Three times a day (TID) | ORAL | Status: DC | PRN

## 2020-10-12 MED ORDER — ONDANSETRON HCL 4 MG/2ML IJ SOLN: 4.0000 mg | Freq: Four times a day (QID) | INTRAMUSCULAR | Status: DC | PRN

## 2020-10-12 MED ORDER — ACETAMINOPHEN 500 MG PO TABS
1000.0000 mg | ORAL_TABLET | Freq: Four times a day (QID) | ORAL | Status: AC
  Administered 2020-10-12 – 2020-10-13 (×4): 1000 mg via ORAL
  Filled 2020-10-12 (×4): qty 2

## 2020-10-12 SURGICAL SUPPLY — 52 items
BLADE SAG 18X100X1.27 (BLADE) ×2 IMPLANT
BLADE SAW SGTL 11.0X1.19X90.0M (BLADE) ×2 IMPLANT
BNDG ELASTIC 6X15 VLCR STRL LF (GAUZE/BANDAGES/DRESSINGS) ×2 IMPLANT
BOWL SMART MIX CTS (DISPOSABLE) ×2 IMPLANT
CEMENT HV SMART SET (Cement) ×4 IMPLANT
CEMENT TIBIA MBT SIZE 2.5 (Knees) ×1 IMPLANT
COVER SURGICAL LIGHT HANDLE (MISCELLANEOUS) ×2 IMPLANT
CUFF TOURN SGL QUICK 34 (TOURNIQUET CUFF) ×2
CUFF TRNQT CYL 34X4.125X (TOURNIQUET CUFF) ×1 IMPLANT
DECANTER SPIKE VIAL GLASS SM (MISCELLANEOUS) ×2 IMPLANT
DRAPE U-SHAPE 47X51 STRL (DRAPES) ×2 IMPLANT
DRESSING AQUACEL AG SP 3.5X10 (GAUZE/BANDAGES/DRESSINGS) ×1 IMPLANT
DRSG AQUACEL AG SP 3.5X10 (GAUZE/BANDAGES/DRESSINGS) ×2
DURAPREP 26ML APPLICATOR (WOUND CARE) ×2 IMPLANT
ELECT REM PT RETURN 15FT ADLT (MISCELLANEOUS) ×2 IMPLANT
GLOVE SRG 8 PF TXTR STRL LF DI (GLOVE) ×1 IMPLANT
GLOVE SURG ENC MOIS LTX SZ6.5 (GLOVE) ×2 IMPLANT
GLOVE SURG ENC MOIS LTX SZ8 (GLOVE) ×4 IMPLANT
GLOVE SURG UNDER POLY LF SZ7 (GLOVE) ×2 IMPLANT
GLOVE SURG UNDER POLY LF SZ8 (GLOVE) ×2
GLOVE SURG UNDER POLY LF SZ8.5 (GLOVE) ×2 IMPLANT
GOWN STRL REUS W/TWL LRG LVL3 (GOWN DISPOSABLE) ×4 IMPLANT
GOWN STRL REUS W/TWL XL LVL3 (GOWN DISPOSABLE) ×2 IMPLANT
HANDPIECE INTERPULSE COAX TIP (DISPOSABLE) ×2
HOLDER FOLEY CATH W/STRAP (MISCELLANEOUS) IMPLANT
IMMOBILIZER KNEE 20 (SOFTGOODS) ×2
IMMOBILIZER KNEE 20 THIGH 36 (SOFTGOODS) ×1 IMPLANT
IMPL FEMUR SIGMA LT PS SZ 3 (Knees) ×1 IMPLANT
IMPLANT FEMUR SIGMA LT PS SZ 3 (Knees) ×2 IMPLANT
INSERT TIBIAL PFC SIG SZ3 10MM (Knees) ×2 IMPLANT
KIT TURNOVER KIT A (KITS) ×2 IMPLANT
MANIFOLD NEPTUNE II (INSTRUMENTS) ×2 IMPLANT
NS IRRIG 1000ML POUR BTL (IV SOLUTION) ×2 IMPLANT
PACK TOTAL KNEE CUSTOM (KITS) ×2 IMPLANT
PADDING CAST SYN 6 (CAST SUPPLIES) ×1
PADDING CAST SYNTHETIC 6X4 NS (CAST SUPPLIES) ×1 IMPLANT
PATELLA DOME PFC 35MM (Knees) ×2 IMPLANT
PENCIL SMOKE EVACUATOR (MISCELLANEOUS) ×2 IMPLANT
PIN STEINMAN FIXATION KNEE (PIN) ×2 IMPLANT
PROTECTOR NERVE ULNAR (MISCELLANEOUS) ×2 IMPLANT
SET HNDPC FAN SPRY TIP SCT (DISPOSABLE) ×1 IMPLANT
STRIP CLOSURE SKIN 1/4X4 (GAUZE/BANDAGES/DRESSINGS) ×2 IMPLANT
SUT MNCRL AB 4-0 PS2 18 (SUTURE) ×2 IMPLANT
SUT STRATAFIX 0 PDS 27 VIOLET (SUTURE) ×2
SUT VIC AB 2-0 CT1 27 (SUTURE) ×6
SUT VIC AB 2-0 CT1 TAPERPNT 27 (SUTURE) ×3 IMPLANT
SUTURE STRATFX 0 PDS 27 VIOLET (SUTURE) ×1 IMPLANT
TIBIA MBT CEMENT SIZE 2.5 (Knees) ×2 IMPLANT
TRAY FOLEY MTR SLVR 16FR STAT (SET/KITS/TRAYS/PACK) ×2 IMPLANT
TUBE SUCTION HIGH CAP CLEAR NV (SUCTIONS) ×2 IMPLANT
WATER STERILE IRR 1000ML POUR (IV SOLUTION) ×4 IMPLANT
WRAP KNEE MAXI GEL POST OP (GAUZE/BANDAGES/DRESSINGS) ×2 IMPLANT

## 2020-10-12 NOTE — Transfer of Care (Signed)
Immediate Anesthesia Transfer of Care Note  Patient: Julie Bonilla  Procedure(s) Performed: Procedure(s): TOTAL KNEE ARTHROPLASTY (Left)  Patient Location: PACU  Anesthesia Type:MAC, Regional and Spinal  Level of Consciousness: Patient easily awoken, sedated, comfortable, cooperative, following commands, responds to stimulation.   Airway & Oxygen Therapy: Patient spontaneously breathing, ventilating well, oxygen via simple oxygen mask.  Post-op Assessment: Report given to PACU RN, vital signs reviewed and stable, .   Post vital signs: Reviewed and stable.  Complications: No apparent anesthesia complications  Last Vitals:  Vitals Value Taken Time  BP 129/65 10/12/20 1356  Temp    Pulse 71 10/12/20 1358  Resp 13 10/12/20 1358  SpO2 99 % 10/12/20 1358  Vitals shown include unvalidated device data.  Last Pain:  Vitals:   10/12/20 1140  TempSrc:   PainSc: 0-No pain      Patients Stated Pain Goal: 4 (54/49/20 1007)  Complications: No complications documented.

## 2020-10-12 NOTE — Progress Notes (Signed)
Assisted Dr. Rob Fitzgerald with left, ultrasound guided, adductor canal block. Side rails up, monitors on throughout procedure. See vital signs in flow sheet. Tolerated Procedure well.  

## 2020-10-12 NOTE — Anesthesia Procedure Notes (Signed)
Anesthesia Regional Block: Adductor canal block   Pre-Anesthetic Checklist: ,, timeout performed, Correct Patient, Correct Site, Correct Laterality, Correct Procedure, Correct Position, site marked, Risks and benefits discussed,  Surgical consent,  Pre-op evaluation,  At surgeon's request and post-op pain management  Laterality: Left  Prep: chloraprep       Needles:  Injection technique: Single-shot  Needle Type: Echogenic Needle     Needle Length: 9cm  Needle Gauge: 21     Additional Needles:   Procedures:,,,, ultrasound used (permanent image in chart),,,,  Narrative:  Start time: 10/12/2020 11:32 AM End time: 10/12/2020 11:36 AM Injection made incrementally with aspirations every 5 mL.  Performed by: Personally  Anesthesiologist: Suzette Battiest, MD

## 2020-10-12 NOTE — Anesthesia Preprocedure Evaluation (Signed)
Anesthesia Evaluation  Patient identified by MRN, date of birth, ID band Patient awake    Reviewed: Allergy & Precautions, NPO status , Patient's Chart, lab work & pertinent test results  Airway Mallampati: II  TM Distance: >3 FB     Dental  (+) Dental Advisory Given   Pulmonary neg pulmonary ROS,    breath sounds clear to auscultation       Cardiovascular hypertension, Pt. on medications  Rhythm:Regular Rate:Normal     Neuro/Psych  Neuromuscular disease    GI/Hepatic Neg liver ROS, GERD  ,  Endo/Other  negative endocrine ROS  Renal/GU negative Renal ROS     Musculoskeletal  (+) Arthritis ,   Abdominal   Peds  Hematology negative hematology ROS (+)   Anesthesia Other Findings   Reproductive/Obstetrics                             Anesthesia Physical Anesthesia Plan  ASA: III  Anesthesia Plan: Spinal   Post-op Pain Management:  Regional for Post-op pain   Induction:   PONV Risk Score and Plan: 2 and Propofol infusion, Ondansetron and Treatment may vary due to age or medical condition  Airway Management Planned: Natural Airway and Simple Face Mask  Additional Equipment:   Intra-op Plan:   Post-operative Plan:   Informed Consent: I have reviewed the patients History and Physical, chart, labs and discussed the procedure including the risks, benefits and alternatives for the proposed anesthesia with the patient or authorized representative who has indicated his/her understanding and acceptance.       Plan Discussed with: CRNA  Anesthesia Plan Comments:         Anesthesia Quick Evaluation

## 2020-10-12 NOTE — Op Note (Signed)
OPERATIVE REPORT-TOTAL KNEE ARTHROPLASTY   Pre-operative diagnosis- Osteoarthritis  Left knee(s)  Post-operative diagnosis- Osteoarthritis Left knee(s)  Procedure-  Left  Total Knee Arthroplasty  Surgeon- Dione Plover. Marily Konczal, MD  Assistant- Theresa Duty, PA-C   Anesthesia-  Adductor canal block and spinal  EBL-25 mL   Drains None  Tourniquet time-  Total Tourniquet Time Documented: Thigh (Left) - 32 minutes Total: Thigh (Left) - 32 minutes     Complications- None  Condition-PACU - hemodynamically stable.   Brief Clinical Note  Julie Bonilla is a 80 y.o. 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 She has constant pain, with activity and at rest and significant functional deficits with difficulties even with ADLs. She has had extensive non-op management including analgesics, injections of cortisone and viscosupplements, and home exercise program, but remains in significant pain with significant dysfunction. Radiographs show bone on bone arthritis medial and patellofemoral. She presents now for left Total Knee Arthroplasty.    Procedure in detail---   The patient is brought into the operating room and positioned supine on the operating table. After successful administration of  Adductor canal block and spinal,   a tourniquet is placed high on the  Left thigh(s) and the lower extremity is prepped and draped in the usual sterile fashion. Time out is performed by the operating team and then the  Left lower extremity is wrapped in Esmarch, knee flexed and the tourniquet inflated to 300 mmHg.       A midline incision is made with a ten blade through the subcutaneous tissue to the level of the extensor mechanism. A fresh blade is used to make a medial parapatellar arthrotomy. Soft tissue over the proximal medial tibia is subperiosteally elevated to the joint line with a knife and into the semimembranosus bursa with a Cobb  elevator. Soft tissue over the proximal lateral tibia is elevated with attention being paid to avoiding the patellar tendon on the tibial tubercle. The patella is everted, knee flexed 90 degrees and the ACL and PCL are removed. Findings are bone on bone medial and patellofemoral        The drill is used to create a starting hole in the distal femur and the canal is thoroughly irrigated with sterile saline to remove the fatty contents. The 5 degree Left  valgus alignment guide is placed into the femoral canal and the distal femoral cutting block is pinned to remove 10 mm off the distal femur. Resection is made with an oscillating saw.      The tibia is subluxed forward and the menisci are removed. The extramedullary alignment guide is placed referencing proximally at the medial aspect of the tibial tubercle and distally along the second metatarsal axis and tibial crest. The block is pinned to remove 72mm off the more deficient medial  side. Resection is made with an oscillating saw. Size 2.5is the most appropriate size for the tibia and the proximal tibia is prepared with the modular drill and keel punch for that size.      The femoral sizing guide is placed and size 3 is most appropriate. Rotation is marked off the epicondylar axis and confirmed by creating a rectangular flexion gap at 90 degrees. The size 3 cutting block is pinned in this rotation and the anterior, posterior and chamfer cuts are made with the oscillating saw. The intercondylar block is then placed and that cut is made.  Trial size 2.5 tibial component, trial size 3 posterior stabilized femur and a 10  mm posterior stabilized rotating platform insert trial is placed. Full extension is achieved with excellent varus/valgus and anterior/posterior balance throughout full range of motion. The patella is everted and thickness measured to be 21  mm. Free hand resection is taken to 12 mm, a 35 template is placed, lug holes are drilled, trial patella  is placed, and it tracks normally. Osteophytes are removed off the posterior femur with the trial in place. All trials are removed and the cut bone surfaces prepared with pulsatile lavage. Cement is mixed and once ready for implantation, the size 2.5 tibial implant, size  3 posterior stabilized femoral component, and the size 35 patella are cemented in place and the patella is held with the clamp. The trial insert is placed and the knee held in full extension. The Exparel (20 ml mixed with 60 ml saline) is injected into the extensor mechanism, posterior capsule, medial and lateral gutters and subcutaneous tissues.  All extruded cement is removed and once the cement is hard the permanent 10 mm posterior stabilized rotating platform insert is placed into the tibial tray.      The wound is copiously irrigated with saline solution and the extensor mechanism closed with # 0 Stratofix suture. The tourniquet is released for a total tourniquet time of 32  minutes. Flexion against gravity is 140 degrees and the patella tracks normally. Subcutaneous tissue is closed with 2.0 vicryl and subcuticular with running 4.0 Monocryl. The incision is cleaned and dried and steri-strips and a bulky sterile dressing are applied. The limb is placed into a knee immobilizer and the patient is awakened and transported to recovery in stable condition.      Please note that a surgical assistant was a medical necessity for this procedure in order to perform it in a safe and expeditious manner. Surgical assistant was necessary to retract the ligaments and vital neurovascular structures to prevent injury to them and also necessary for proper positioning of the limb to allow for anatomic placement of the prosthesis.   Dione Plover Rihaan Barrack, MD    10/12/2020, 1:28 PM

## 2020-10-12 NOTE — Progress Notes (Signed)
Orthopedic Tech Progress Note Patient Details:  Julie Bonilla [birth year] [id number]  CPM Left Knee CPM Left Knee: On Left Knee Flexion (Degrees): 40 Left Knee Extension (Degrees): 10  Post Interventions Patient Tolerated: Well  Julie Bonilla Julie Bonilla 10/12/2020, 2:31 PM

## 2020-10-12 NOTE — Anesthesia Postprocedure Evaluation (Signed)
Anesthesia Post Note  Patient: Julie Bonilla  Procedure(s) Performed: TOTAL KNEE ARTHROPLASTY (Left Knee)     Patient location during evaluation: PACU Anesthesia Type: Spinal Level of consciousness: awake and alert Pain management: pain level controlled Vital Signs Assessment: post-procedure vital signs reviewed and stable Respiratory status: spontaneous breathing and respiratory function stable Cardiovascular status: blood pressure returned to baseline and stable Postop Assessment: spinal receding Anesthetic complications: no   No complications documented.  Last Vitals:  Vitals:   10/12/20 1445 10/12/20 1500  BP: (!) 143/73 129/71  Pulse: 73 76  Resp: 19 18  Temp: 36.4 C (!) 36.2 C  SpO2: 94%     Last Pain:  Vitals:   10/12/20 1500  TempSrc: Oral  PainSc:                  Tiajuana Amass

## 2020-10-12 NOTE — Anesthesia Procedure Notes (Signed)
Procedure Name: MAC Date/Time: 10/12/2020 12:14 PM Performed by: Deliah Boston, CRNA Pre-anesthesia Checklist: Patient identified, Emergency Drugs available, Suction available and Patient being monitored Patient Re-evaluated:Patient Re-evaluated prior to induction Oxygen Delivery Method: Simple face mask Preoxygenation: Pre-oxygenation with 100% oxygen Induction Type: IV induction Placement Confirmation: positive ETCO2 and breath sounds checked- equal and bilateral

## 2020-10-12 NOTE — Discharge Instructions (Signed)
 Frank Aluisio, MD Total Joint Specialist [facility] [place] [address] [place], [place] [address] [phone]  TOTAL KNEE REPLACEMENT POSTOPERATIVE DIRECTIONS    Knee Rehabilitation, Guidelines Following Surgery  Results after knee surgery are often greatly improved when you follow the exercise, range of motion and muscle strengthening exercises prescribed by your doctor. Safety measures are also important to protect the knee from further injury. If any of these exercises cause you to have increased pain or swelling in your knee joint, decrease the amount until you are comfortable again and slowly increase them. If you have problems or questions, call your caregiver or physical therapist for advice.   BLOOD CLOT PREVENTION . Take a 325 mg Aspirin two times a day for three weeks following surgery. Then take an 81 mg Aspirin once a day for three weeks. Then discontinue Aspirin. . You may resume your vitamins/supplements upon discharge from the hospital. . Do not take any NSAIDs (Advil, Aleve, Ibuprofen, Meloxicam, etc.) until you have discontinued the 325 mg Aspirin.  HOME CARE INSTRUCTIONS  . Remove items at home which could result in a fall. This includes throw rugs or furniture in walking pathways.  . ICE to the affected knee as much as tolerated. Icing helps control swelling. If the swelling is well controlled you will be more comfortable and rehab easier. Continue to use ice on the knee for pain and swelling from surgery. You may notice swelling that will progress down to the foot and ankle. This is normal after surgery. Elevate the leg when you are not up walking on it.    . Continue to use the breathing machine which will help keep your temperature down. It is common for your temperature to cycle up and down following surgery, especially at night when you are not up moving around and exerting yourself. The breathing machine keeps your lungs expanded and your  temperature down. . Do not place pillow under the operative knee, focus on keeping the knee straight while resting  DIET You may resume your previous home diet once you are discharged from the hospital.  DRESSING / WOUND CARE / SHOWERING . Keep your bulky bandage on for 2 days. On the third post-operative day you may remove the Ace bandage and gauze. There is a waterproof adhesive bandage on your skin which will stay in place until your first follow-up appointment. Once you remove this you will not need to place another bandage . You may begin showering 3 days following surgery, but do not submerge the incision under water.  ACTIVITY For the first 5 days, the key is rest and control of pain and swelling . Do your home exercises twice a day starting on post-operative day 3. On the days you go to physical therapy, just do the home exercises once that day. . You should rest, ice and elevate the leg for 50 minutes out of every hour. Get up and walk/stretch for 10 minutes per hour. After 5 days you can increase your activity slowly as tolerated. . Walk with your walker as instructed. Use the walker until you are comfortable transitioning to a cane. Walk with the cane in the opposite hand of the operative leg. You may discontinue the cane once you are comfortable and walking steadily. . Avoid periods of inactivity such as sitting longer than an hour when not asleep. This helps prevent blood clots.  . You may discontinue the knee immobilizer once you are able to perform a straight   leg raise while lying down. . You may resume a sexual relationship in one month or when given the OK by your doctor.  . You may return to work once you are cleared by your doctor.  . Do not drive a car for 6 weeks or until released by your surgeon.  . Do not drive while taking narcotics.  TED HOSE STOCKINGS Wear the elastic stockings on both legs for three weeks following surgery during the day. You may remove them at night  for sleeping.  WEIGHT BEARING Weight bearing as tolerated with assist device (walker, cane, etc) as directed, use it as long as suggested by your surgeon or therapist, typically at least 4-6 weeks.  POSTOPERATIVE CONSTIPATION PROTOCOL Constipation - defined medically as fewer than three stools per week and severe constipation as less than one stool per week.  One of the most common issues patients have following surgery is constipation.  Even if you have a regular bowel pattern at home, your normal regimen is likely to be disrupted due to multiple reasons following surgery.  Combination of anesthesia, postoperative narcotics, change in appetite and fluid intake all can affect your bowels.  In order to avoid complications following surgery, here are some recommendations in order to help you during your recovery period.  . Colace (docusate) - Pick up an over-the-counter form of Colace or another stool softener and take twice a day as long as you are requiring postoperative pain medications.  Take with a full glass of water daily.  If you experience loose stools or diarrhea, hold the colace until you stool forms back up. If your symptoms do not get better within 1 week or if they get worse, check with your doctor. . Dulcolax (bisacodyl) - Pick up over-the-counter and take as directed by the product packaging as needed to assist with the movement of your bowels.  Take with a full glass of water.  Use this product as needed if not relieved by Colace only.  . MiraLax (polyethylene glycol) - Pick up over-the-counter to have on hand. MiraLax is a solution that will increase the amount of water in your bowels to assist with bowel movements.  Take as directed and can mix with a glass of water, juice, soda, coffee, or tea. Take if you go more than two days without a movement. Do not use MiraLax more than once per day. Call your doctor if you are still constipated or irregular after using this medication for 7 days  in a row.  If you continue to have problems with postoperative constipation, please contact the office for further assistance and recommendations.  If you experience "the worst abdominal pain ever" or develop nausea or vomiting, please contact the office immediatly for further recommendations for treatment.  ITCHING If you experience itching with your medications, try taking only a single pain pill, or even half a pain pill at a time.  You can also use Benadryl over the counter for itching or also to help with sleep.   MEDICATIONS See your medication summary on the "After Visit Summary" that the nursing staff will review with you prior to discharge.  You may have some home medications which will be placed on hold until you complete the course of blood thinner medication.  It is important for you to complete the blood thinner medication as prescribed by your surgeon.  Continue your approved medications as instructed at time of discharge.  PRECAUTIONS . If you experience chest pain or shortness of   breath - call 911 immediately for transfer to the hospital emergency department.  . If you develop a fever greater that 101 F, purulent drainage from wound, increased redness or drainage from wound, foul odor from the wound/dressing, or calf pain - CONTACT YOUR SURGEON.                                                   FOLLOW-UP APPOINTMENTS Make sure you keep all of your appointments after your operation with your surgeon and caregivers. You should call the office at the above phone number and make an appointment for approximately two weeks after the date of your surgery or on the date instructed by your surgeon outlined in the "After Visit Summary".  RANGE OF MOTION AND STRENGTHENING EXERCISES  Rehabilitation of the knee is important following a knee injury or an operation. After just a few days of immobilization, the muscles of the thigh which control the knee become weakened and shrink (atrophy). Knee  exercises are designed to build up the tone and strength of the thigh muscles and to improve knee motion. Often times heat used for twenty to thirty minutes before working out will loosen up your tissues and help with improving the range of motion but do not use heat for the first two weeks following surgery. These exercises can be done on a training (exercise) mat, on the floor, on a table or on a bed. Use what ever works the best and is most comfortable for you Knee exercises include:  . Leg Lifts - While your knee is still immobilized in a splint or cast, you can do straight leg raises. Lift the leg to 60 degrees, hold for 3 sec, and slowly lower the leg. Repeat 10-20 times 2-3 times daily. Perform this exercise against resistance later as your knee gets better.  Javier Docker and Hamstring Sets - Tighten up the muscle on the front of the thigh (Quad) and hold for 5-10 sec. Repeat this 10-20 times hourly. Hamstring sets are done by pushing the foot backward against an object and holding for 5-10 sec. Repeat as with quad sets.   Leg Slides: Lying on your back, slowly slide your foot toward your buttocks, bending your knee up off the floor (only go as far as is comfortable). Then slowly slide your foot back down until your leg is flat on the floor again.  Angel Wings: Lying on your back spread your legs to the side as far apart as you can without causing discomfort.  A rehabilitation program following serious knee injuries can speed recovery and prevent re-injury in the future due to weakened muscles. Contact your doctor or a physical therapist for more information on knee rehabilitation.   POST-OPERATIVE OPIOID TAPER INSTRUCTIONS: . It is important to wean off of your opioid medication as soon as possible. If you do not need pain medication after your surgery it is ok to stop day one. Marland Kitchen Opioids include: o Codeine, Hydrocodone(Norco, Vicodin), Oxycodone(Percocet, oxycontin) and hydromorphone amongst others.   . Long term and even short term use of opiods can cause: o Increased pain response o Dependence o Constipation o Depression o Respiratory depression o And more.  . Withdrawal symptoms can include o Flu like symptoms o Nausea, vomiting o And more . Techniques to manage these symptoms o Hydrate well o Eat regular  healthy meals o Stay active o Use relaxation techniques(deep breathing, meditating, yoga) . Do Not substitute Alcohol to help with tapering . If you have been on opioids for less than two weeks and do not have pain than it is ok to stop all together.  . Plan to wean off of opioids o This plan should start within one week post op of your joint replacement. o Maintain the same interval or time between taking each dose and first decrease the dose.  o Cut the total daily intake of opioids by one tablet each day o Next start to increase the time between doses. o The last dose that should be eliminated is the evening dose.     IF YOU ARE TRANSFERRED TO A SKILLED REHAB FACILITY If the patient is transferred to a skilled rehab facility following release from the hospital, a list of the current medications will be sent to the facility for the patient to continue.  When discharged from the skilled rehab facility, please have the facility set up the patient's [place] prior to being released. Also, the skilled facility will be responsible for providing the patient with their medications at time of release from the facility to include their pain medication, the muscle relaxants, and their blood thinner medication. If the patient is still at the rehab facility at time of the two week follow up appointment, the skilled rehab facility will also need to assist the patient in arranging follow up appointment in our office and any transportation needs.  MAKE SURE YOU:  . Understand these instructions.  . Get help right away if you are not doing well or get worse.   DENTAL  ANTIBIOTICS:  In most cases prophylactic antibiotics for Dental procdeures after total joint surgery are not necessary.  Exceptions are as follows:  1. History of prior total joint infection  2. Severely immunocompromised (Organ Transplant, cancer chemotherapy, Rheumatoid biologic meds such as [place])  3. Poorly controlled diabetes (A1C &gt; 8.0, blood glucose over 200)  If you have one of these conditions, contact your surgeon for an antibiotic prescription, prior to your dental procedure.    Pick up stool softner and laxative for home use following surgery while on pain medications. Do not submerge incision under water. Please use good hand washing techniques while changing dressing each day. May shower starting three days after surgery. Please use a clean towel to pat the incision dry following showers. Continue to use ice for pain and swelling after surgery. Do not use any lotions or creams on the incision until instructed by your surgeon.

## 2020-10-12 NOTE — Care Plan (Signed)
Ortho Bundle Case Management Note  Patient Details  Name: Julie Bonilla MRN: [id number] Date of Birth: 29-Jan-1941                  L TKA on 10/12/20. DCP: Home with twin sister. DME: No needs. Has RW & 3in1. PT: Nicole Kindred PT [place] 5/12 2pm   DME Arranged:  N/A DME Agency:     HH Arranged:    [place] Agency:     Additional Comments: Please contact me with any questions of if this plan should need to change.  Marianne Sofia, RN,CCM [facility]  [phone] 10/12/2020, 10:38 AM

## 2020-10-12 NOTE — Anesthesia Procedure Notes (Signed)
Spinal  Patient location during procedure: OR Start time: 10/12/2020 12:15 PM End time: 10/12/2020 12:20 PM Reason for block: surgical anesthesia Staffing Performed: anesthesiologist  Anesthesiologist: Suzette Battiest, MD Preanesthetic Checklist Completed: patient identified, IV checked, site marked, risks and benefits discussed, surgical consent, monitors and equipment checked, pre-op evaluation and timeout performed Spinal Block Patient position: sitting Prep: DuraPrep Patient monitoring: heart rate, cardiac monitor, continuous pulse ox and blood pressure Approach: midline Location: L4-5 Injection technique: single-shot Needle Needle type: Pencan  Needle gauge: 24 G Needle length: 9 cm Assessment Sensory level: T4 Events: CSF return

## 2020-10-12 NOTE — Evaluation (Signed)
Physical Therapy Evaluation Patient Details Name: Julie Bonilla MRN: [id number] DOB: 1940-12-02 Today's Date: 10/12/2020   History of Present Illness  Patient is 80 y.o. female s/p Lt TKA on 10/12/20 with PMH significant for OA, HTN, HLD, GERD, anemia, ADHD, anxiety, depression, osteopenia, RLS, neuropathy, Rt TKA.    Clinical Impression  Julie Bonilla is a 80 y.o. female POD 0 s/p Lt TKA. Patient reports independence with mobility at baseline. Patient is now limited by functional impairments (see PT problem list below) and requires min assist/guard for transfers and gait with RW. Patient was able to ambulate ~60 feet with RW and min assist. Patient instructed in exercise to facilitate ROM and circulation to manage edema and reduce risk of DVT. Patient will benefit from continued skilled PT interventions to address impairments and progress towards PLOF. Acute PT will follow to progress mobility and stair training in preparation for safe discharge home.     Follow Up Recommendations Follow surgeon's recommendation for DC plan and follow-up therapies;Outpatient PT    Equipment Recommendations  None recommended by PT    Recommendations for Other Services       Precautions / Restrictions Precautions Precautions: Fall Restrictions Weight Bearing Restrictions: No RLE Weight Bearing: Weight bearing as tolerated      Mobility  Bed Mobility Overal bed mobility: Needs Assistance Bed Mobility: Supine to Sit     Supine to sit: Min guard;HOB elevated     General bed mobility comments: min cues for sequencing and pt able to bring LE's off EOB fully and raise trunk with use of bed rail.    Transfers Overall transfer level: Needs assistance Equipment used: Rolling walker (2 wheeled) Transfers: Sit to/from Stand Sit to Stand: Min assist         General transfer comment: cues for hand placement, pt initiated power up and was steady with rising. min assist to keep balance as pt popped  up quickly.  Ambulation/Gait Ambulation/Gait assistance: Min assist;Min guard Gait Distance (Feet): 60 Feet Assistive device: Rolling walker (2 wheeled) Gait Pattern/deviations: Step-to pattern;Decreased stride length;Decreased weight shift to left Gait velocity: decr   General Gait Details: Min cues for step pattern and proximity to RW, intermittent assist required to manage walker position.  Stairs            Wheelchair Mobility    Modified Rankin (Stroke Patients Only)       Balance Overall balance assessment: Needs assistance Sitting-balance support: Feet supported Sitting balance-Leahy Scale: Good     Standing balance support: During functional activity;Bilateral upper extremity supported Standing balance-Leahy Scale: Fair                               Pertinent Vitals/Pain Pain Assessment: 0-10 Pain Score: 6  Pain Location: Lt knee Pain Descriptors / Indicators: Aching;Discomfort Pain Intervention(s): Limited activity within patient's tolerance;Monitored during session;Repositioned;Ice applied;Patient requesting pain meds-RN notified    Home Living Family/patient expects to be discharged to:: Private residence Living Arrangements: Other (Comment) (twin sister) Available Help at Discharge: Family Type of Home: House Home Access: Stairs to enter Entrance Stairs-Rails: None Entrance Stairs-Number of Steps: 1 Home Layout: One level Home Equipment: Environmental consultant - 2 wheels;Cane - single point;Bedside commode;Shower seat      Prior Function Level of Independence: Independent               Hand Dominance   Dominant Hand: Right    Extremity/Trunk Assessment  Upper Extremity Assessment Upper Extremity Assessment: Overall WFL for tasks assessed    Lower Extremity Assessment Lower Extremity Assessment: LLE deficits/detail LLE Deficits / Details: good quad activation, no extensor lag with SLR LLE Sensation: WNL LLE Coordination: WNL     Cervical / Trunk Assessment Cervical / Trunk Assessment: Normal  Communication   Communication: HOH  Cognition Arousal/Alertness: Awake/alert Behavior During Therapy: WFL for tasks assessed/performed Overall Cognitive Status: Within Functional Limits for tasks assessed                                        General Comments      Exercises Total Joint Exercises Ankle Circles/Pumps: AROM;Both;20 reps;Seated Quad Sets: AROM;Right;Seated;10 reps Heel Slides: AROM;Right;5 reps;Seated   Assessment/Plan    PT Assessment Patient needs continued PT services  PT Problem List Decreased strength;Decreased range of motion;Decreased activity tolerance;Decreased balance;Decreased mobility;Decreased knowledge of use of DME;Decreased knowledge of precautions;Pain       PT Treatment Interventions DME instruction;Gait training;Stair training;Functional mobility training;Therapeutic activities;Therapeutic exercise;Balance training;Patient/family education    PT Goals (Current goals can be found in the Care Plan section)  Acute Rehab PT Goals Patient Stated Goal: get walking and get in the garden again PT Goal Formulation: With patient Time For Goal Achievement: 10/19/20 Potential to Achieve Goals: Good    Frequency 7X/week   Barriers to discharge        Co-evaluation               AM-PAC PT "6 Clicks" Mobility  Outcome Measure Help needed turning from your back to your side while in a flat bed without using bedrails?: None Help needed moving from lying on your back to sitting on the side of a flat bed without using bedrails?: A Little Help needed moving to and from a bed to a chair (including a wheelchair)?: A Little Help needed standing up from a chair using your arms (e.g., wheelchair or bedside chair)?: A Little Help needed to walk in hospital room?: A Little Help needed climbing 3-5 steps with a railing? : A Little 6 Click Score: 19    End of Session  Equipment Utilized During Treatment: Gait belt Activity Tolerance: Patient tolerated treatment well Patient left: in chair;with call bell/phone within reach;with chair alarm set Nurse Communication: Mobility status;Patient requests pain meds PT Visit Diagnosis: Muscle weakness (generalized) (M62.81);Difficulty in walking, not elsewhere classified (R26.2)    Time: 8469-6295 PT Time Calculation (min) (ACUTE ONLY): 25 min   Charges:   PT Evaluation $PT Eval Low Complexity: 1 Low PT Treatments $Gait Training: 8-22 mins        Verner Mould, DPT Acute Rehabilitation Services Office [phone] Pager [phone]    Jacques Navy 10/12/2020, 6:36 PM

## 2020-10-12 NOTE — Interval H&P Note (Signed)
History and Physical Interval Note:  10/12/2020 10:21 AM  Julie Bonilla  has presented today for surgery, with the diagnosis of left knee osteoarthritis.  The various methods of treatment have been discussed with the patient and family. After consideration of risks, benefits and other options for treatment, the patient has consented to  Procedure(s): TOTAL KNEE ARTHROPLASTY (Left) as a surgical intervention.  The patient's history has been reviewed, patient examined, no change in status, stable for surgery.  I have reviewed the patient's chart and labs.  Questions were answered to the patient's satisfaction.     Pilar Plate Mana Morison

## 2020-10-13 ENCOUNTER — Encounter (HOSPITAL_COMMUNITY): Payer: Self-pay | Admitting: Orthopedic Surgery

## 2020-10-13 DIAGNOSIS — M1712 Unilateral primary osteoarthritis, left knee: Secondary | ICD-10-CM | POA: Diagnosis not present

## 2020-10-13 LAB — CBC
HCT: 33 % — ABNORMAL LOW (ref 36.0–46.0)
Hemoglobin: 10.5 g/dL — ABNORMAL LOW (ref 12.0–15.0)
MCH: 31.3 pg (ref 26.0–34.0)
MCHC: 31.8 g/dL (ref 30.0–36.0)
MCV: 98.5 fL (ref 80.0–100.0)
Platelets: 222 10*3/uL (ref 150–400)
RBC: 3.35 MIL/uL — ABNORMAL LOW (ref 3.87–5.11)
RDW: 13.3 % (ref 11.5–15.5)
WBC: 12.2 10*3/uL — ABNORMAL HIGH (ref 4.0–10.5)
nRBC: 0 % (ref 0.0–0.2)

## 2020-10-13 LAB — BASIC METABOLIC PANEL
Anion gap: 6 (ref 5–15)
BUN: 14 mg/dL (ref 8–23)
CO2: 24 mmol/L (ref 22–32)
Calcium: 8.1 mg/dL — ABNORMAL LOW (ref 8.9–10.3)
Chloride: 108 mmol/L (ref 98–111)
Creatinine, Ser: 0.76 mg/dL (ref 0.44–1.00)
GFR, Estimated: >60 mL/min (ref >60)
Glucose, Bld: 136 mg/dL — ABNORMAL HIGH (ref 70–99)
Potassium: 4.6 mmol/L (ref 3.5–5.1)
Sodium: 138 mmol/L (ref 135–145)

## 2020-10-13 MED ORDER — NYSTATIN 100000 UNIT/ML MT SUSP: 5.0000 mL | Freq: Four times a day (QID) | OROMUCOSAL | 0 refills | Status: DC | PRN

## 2020-10-13 MED ORDER — METHOCARBAMOL 500 MG PO TABS: 500.0000 mg | ORAL_TABLET | Freq: Four times a day (QID) | ORAL | 0 refills | Status: DC | PRN

## 2020-10-13 MED ORDER — CHLORHEXIDINE GLUCONATE CLOTH 2 % EX PADS
6.0000 | MEDICATED_PAD | Freq: Every day | CUTANEOUS | Status: DC
  Administered 2020-10-13: 6 via TOPICAL

## 2020-10-13 MED ORDER — OXYCODONE HCL 5 MG PO TABS: 5.0000 mg | ORAL_TABLET | Freq: Four times a day (QID) | ORAL | 0 refills | Status: DC | PRN

## 2020-10-13 MED ORDER — ASPIRIN 325 MG PO TBEC: 325.0000 mg | DELAYED_RELEASE_TABLET | Freq: Two times a day (BID) | ORAL | 0 refills | Status: AC

## 2020-10-13 NOTE — Progress Notes (Signed)
Physical Therapy Treatment Patient Details Name: Julie Bonilla MRN: [id number] DOB: [birth year] Today's Date: 10/13/2020    History of Present Illness Patient is 80 y.o. female s/p Lt TKA on 10/12/20 with PMH significant for OA, HTN, HLD, GERD, anemia, ADHD, anxiety, depression, osteopenia, RLS, neuropathy, Rt TKA.    PT Comments    Pt is progressing well with mobility. She ambulated 56' with RW, no loss of balance. Stair training completed. Pt demonstrates good understanding of HEP. She is ready to DC home from a PT standpoint.   Follow Up Recommendations  Follow surgeon's recommendation for DC plan and follow-up therapies;Outpatient PT     Equipment Recommendations  None recommended by PT    Recommendations for Other Services       Precautions / Restrictions Precautions Precautions: Fall Precaution Comments: reviewed no pillow under knee Restrictions Weight Bearing Restrictions: No RLE Weight Bearing: Weight bearing as tolerated    Mobility  Bed Mobility               General bed mobility comments: up in recliner    Transfers Overall transfer level: Needs assistance Equipment used: Rolling walker (2 wheeled) Transfers: Sit to/from Stand           General transfer comment: cues for hand placement, pt initiated power up and was steady with rising.  Ambulation/Gait Ambulation/Gait assistance: Supervision Gait Distance (Feet): 130 Feet Assistive device: Rolling walker (2 wheeled) Gait Pattern/deviations: Step-to pattern;Decreased stride length;Decreased weight shift to left Gait velocity: decr   General Gait Details: Min cues for step pattern and proximity to RW   Stairs Stairs: Yes Stairs assistance: Min guard Stair Management: One rail Right;Step to pattern;Forwards Number of Stairs: 3 General stair comments: VCs sequencing   Wheelchair Mobility    Modified Rankin (Stroke Patients Only)       Balance Overall balance assessment: Needs  assistance Sitting-balance support: Feet supported Sitting balance-Leahy Scale: Good     Standing balance support: During functional activity;Bilateral upper extremity supported Standing balance-Leahy Scale: Fair                              Cognition Arousal/Alertness: Awake/alert Behavior During Therapy: WFL for tasks assessed/performed Overall Cognitive Status: Within Functional Limits for tasks assessed                                        Exercises Total Joint Exercises Ankle Circles/Pumps: AROM;Both;20 reps;Seated Quad Sets: AROM;Seated;10 reps;Left Short Arc Quad: AROM;Left;5 reps;Supine Heel Slides: AROM;Left;Supine;10 reps Hip ABduction/ADduction: Left;10 reps;Supine Straight Leg Raises: AROM;Left;10 reps;Supine Long Arc Quad: AROM;Left;10 reps;Seated Knee Flexion: AAROM;Left;10 reps;Seated Goniometric ROM: 0-70* AAROM L knee    General Comments        Pertinent Vitals/Pain Pain Score: 4  Pain Location: Lt knee Pain Descriptors / Indicators: Aching;Discomfort Pain Intervention(s): Limited activity within patient's tolerance;Monitored during session;Premedicated before session;Ice applied    Home Living                      Prior Function            PT Goals (current goals can now be found in the care plan section) Acute Rehab PT Goals Patient Stated Goal: get walking and get in the garden again PT Goal Formulation: With patient Time For Goal Achievement: 10/19/20 Potential to Achieve Goals:  Good Progress towards PT goals: Progressing toward goals    Frequency    7X/week      PT Plan      Co-evaluation              AM-PAC PT "6 Clicks" Mobility   Outcome Measure  Help needed turning from your back to your side while in a flat bed without using bedrails?: None Help needed moving from lying on your back to sitting on the side of a flat bed without using bedrails?: A Little Help needed moving to and  from a bed to a chair (including a wheelchair)?: A Little Help needed standing up from a chair using your arms (e.g., wheelchair or bedside chair)?: A Little Help needed to walk in hospital room?: A Little Help needed climbing 3-5 steps with a railing? : A Little 6 Click Score: 19    End of Session Equipment Utilized During Treatment: Gait belt Activity Tolerance: Patient tolerated treatment well Patient left: in chair;with call bell/phone within reach;with chair alarm set Nurse Communication: Mobility status;Patient requests pain meds PT Visit Diagnosis: Muscle weakness (generalized) (M62.81);Difficulty in walking, not elsewhere classified (R26.2)     Time: 6384-5364 PT Time Calculation (min) (ACUTE ONLY): 25 min  Charges:  $Gait Training: 8-22 mins $Therapeutic Exercise: 8-22 mins                     Blondell Reveal Kistler PT 10/13/2020  Acute Rehabilitation Services Pager [phone] Office [phone]
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
## 2020-10-13 NOTE — TOC Transition Note (Signed)
Transition of Care [facility]) - CM/SW Discharge Note  Patient Details  Name: Julie Bonilla MRN: [id number] Date of Birth: Oct 26, 1940  [facility] of [facility]) CM/SW Contact:  Sherie Don, LCSW Phone Number: 10/13/2020, 9:18 AM  Clinical Narrative: Patient is expected to discharge home today after working with PT. CSW met with patient to review discharge plan. Patient to go to OPPT at [facility] in [place] with her first appointment on 10/15/20. Patient has a rolling walker, 3N1, and toilet riser at home so there are no DME needs at this time. TOC signing off.  Final next level of care: OP Rehab  Patient Goals and CMS Choice Patient states their goals for this hospitalization and ongoing recovery are:: Discharge home with OPPT through [facility] in [place] [email or web address].gov Compare Post Acute Care list provided to:: Patient Choice offered to / list presented to : Patient  Discharge Plan and Services         DME Arranged: N/A DME Agency: NA  Readmission Risk Interventions No flowsheet data found.

## 2020-10-15 DIAGNOSIS — R2689 Other abnormalities of gait and mobility: Secondary | ICD-10-CM | POA: Diagnosis not present

## 2020-10-15 DIAGNOSIS — M25562 Pain in left knee: Secondary | ICD-10-CM | POA: Diagnosis not present

## 2020-10-19 DIAGNOSIS — M25562 Pain in left knee: Secondary | ICD-10-CM | POA: Diagnosis not present

## 2020-10-19 DIAGNOSIS — R2689 Other abnormalities of gait and mobility: Secondary | ICD-10-CM | POA: Diagnosis not present
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
## 2020-10-22 ENCOUNTER — Telehealth: Payer: Self-pay | Admitting: Pharmacist

## 2020-10-22 DIAGNOSIS — R2689 Other abnormalities of gait and mobility: Secondary | ICD-10-CM | POA: Diagnosis not present

## 2020-10-22 DIAGNOSIS — M25562 Pain in left knee: Secondary | ICD-10-CM | POA: Diagnosis not present

## 2020-10-22 NOTE — Chronic Care Management (AMB) (Signed)
I spoke with the patient about her upcoming appointment on 10/23/2020 @ 11:00 am with the clinical pharmacist. She was asked to please have all medication on hand to review with the pharmacist. She confirmed the appointment.   Julie Bonilla, [place] Pharmacist Assistant [phone]

## 2020-10-23 ENCOUNTER — Ambulatory Visit (INDEPENDENT_AMBULATORY_CARE_PROVIDER_SITE_OTHER): Payer: PPO | Admitting: Pharmacist

## 2020-10-23 DIAGNOSIS — E785 Hyperlipidemia, unspecified: Secondary | ICD-10-CM | POA: Diagnosis not present

## 2020-10-23 DIAGNOSIS — I10 Essential (primary) hypertension: Secondary | ICD-10-CM

## 2020-10-23 NOTE — Progress Notes (Signed)
Chronic Care Management Pharmacy Note  11/03/2020 Name:  MAVA SUARES MRN:  [id number] DOB:  05/19/1941  Subjective: Julie Bonilla is an 80 y.o. year old female who is a primary patient of Panosh, Standley Brooking, MD.  The CCM team was consulted for assistance with disease management and care coordination needs.    Engaged with patient by telephone for follow up visit in response to provider referral for pharmacy case management and/or care coordination services.   Consent to Services:  The patient was given information about Chronic Care Management services, agreed to services, and gave verbal consent prior to initiation of services.  Please see initial visit note for detailed documentation.   Patient Care Team: Panosh, Standley Brooking, MD as PCP - Gaston Islam, MD as Consulting Physician (Orthopedic Surgery) Pedro Earls, MD as Attending Physician (Family Medicine) Izora Gala, MD as Consulting Physician (Otolaryngology) Luberta Mutter, MD as Consulting Physician (Ophthalmology) Viona Gilmore, [facility] as Pharmacist (Pharmacist) Alda Berthold, DO as Consulting Physician (Neurology)  Recent office visits: 09/22/20 Standley Brooking. Panosh MD (PCP) - seen for osteoarthritis of both knees, hypertension, pre-op evaluation, peripheral neuropathy, medication management, bronchiectasis and insomnia. Added amlodipine 11m once daily. Follow up in 1-2 months.   08/14/20 EErline HauMD (PCP) - seen for Hematuria and acute cystitis. Started on Sulfamethoxazole- Trimethoprim 800-1671mtwice daily for 7 days. Patient instructed to follow up if no improvement or worsening of symptoms.  07/21/20 WaStandley BrookingPanosh MD (PCP) - seen for hypertension, medication management, sleep disorder, osteoarthritis of both knees. Added mirtazipine 7.25m33maily at bedtime and changed losartan potassium to 100m51mily. Patient instructed to follow up in 2 months.  06/17/20 WandShanon Ace: Patient presented  for leg pain and medication management. Referral placed to neurology.  Recent consult visits: 09/15/20 Jefry H. Rosen MD (Otolaryngology) - seen for mouth irritation in the mornings. No medication changes. Follow up in two months of not better.  09/07/20 Donika K. PatePosey Pronto(Neurology) - seen for spinal stenosis. No medication changes. MRI ordered. No follow up noted.  06/10/19 StepGerrit Hallsergeortho): Patient presented for follow up. Unable to access notes.  05/07/20 KrisLoralie Champagne (PT): Patient presented for PT session for right knee pain.  05/05/20 JefrIzora Galaolaryngology): Patient presented for follow up for concerns of thrush.  Hospital visits: 5/9-5/10/22: Patient admitted for knee replacement.  Objective:  Lab Results  Component Value Date   CREATININE 0.76 10/13/2020   BUN 14 10/13/2020   GFR 76.55 09/22/2020   GFRNONAA >60 10/13/2020   GFRAA >60 07/30/2019   NA 138 10/13/2020   K 4.6 10/13/2020   CALCIUM 8.1 (L) 10/13/2020   CO2 24 10/13/2020   GLUCOSE 136 (H) 10/13/2020    Lab Results  Component Value Date/Time   HGBA1C 5.5 09/22/2020 04:13 PM   HGBA1C 5.5 05/15/2019 03:51 PM   GFR 76.55 09/22/2020 04:13 PM   GFR 75.74 05/15/2019 03:51 PM    Last diabetic Eye exam:  Lab Results  Component Value Date/Time   HMDIABEYEEXA Done by Dr. EppeCheral Markerteract surgery 06/07/2007 12:00 AM    Last diabetic Foot exam: No results found for: HMDIABFOOTEX   Lab Results  Component Value Date   CHOL 230 (H) 09/22/2020   HDL 62.40 09/22/2020   LDLCALC 141 (H) 09/22/2020   LDLDIRECT 149.0 05/15/2019   TRIG 134.0 09/22/2020   CHOLHDL 4 09/22/2020    Hepatic Function Latest Ref Rng & Units 10/08/2020 09/22/2020  12/30/2019  Total Protein 6.5 - 8.1 g/dL 6.6 6.5 6.6  Albumin 3.5 - 5.0 g/dL 4.0 4.1 -  AST 15 - 41 U/L 21 22 19   ALT 0 - 44 U/L 21 21 21   Alk Phosphatase 38 - 126 U/L 53 54 -  Total Bilirubin 0.3 - 1.2 mg/dL 0.7 0.5 0.4  Bilirubin, Direct 0.0 - 0.3 mg/dL -  0.1 0.1    Lab Results  Component Value Date/Time   TSH 1.37 09/22/2020 04:13 PM   TSH 1.95 11/01/2017 09:54 AM    CBC Latest Ref Rng & Units 10/13/2020 10/08/2020 09/22/2020  WBC 4.0 - 10.5 K/uL 12.2(H) 5.4 5.3  Hemoglobin 12.0 - 15.0 g/dL 10.5(L) 13.8 13.7  Hematocrit 36.0 - 46.0 % 33.0(L) 42.5 40.7  Platelets 150 - 400 K/uL 222 248 242.0    Lab Results  Component Value Date/Time   VD25OH 49 03/05/2010 09:52 PM    Clinical ASCVD: No  The ASCVD Risk score Mikey Bussing DC Jr., et al., 2013) failed to calculate for the following reasons:   The 2013 ASCVD risk score is only valid for ages 29 to 66    Depression screen PHQ 2/9 01/14/2020 11/14/2018 10/23/2017  Decreased Interest 0 0 0  Down, Depressed, Hopeless 0 0 0  PHQ - 2 Score 0 0 0  Altered sleeping 0 - -  Tired, decreased energy 0 - -  Change in appetite 0 - -  Trouble concentrating 0 - -  Moving slowly or fidgety/restless 0 - -  Suicidal thoughts 0 - -  PHQ-9 Score 0 - -  Difficult doing work/chores Not difficult at all - -  Some recent data might be hidden      Social History   Tobacco Use  Smoking Status Never Smoker  Smokeless Tobacco Never Used   BP Readings from Last 3 Encounters:  10/13/20 (!) 104/57  10/08/20 (!) 146/81  09/22/20 (!) 154/86   Pulse Readings from Last 3 Encounters:  10/13/20 84  10/08/20 74  09/22/20 78   Wt Readings from Last 3 Encounters:  10/12/20 127 lb (57.6 kg)  10/08/20 127 lb (57.6 kg)  09/22/20 131 lb 9.6 oz (59.7 kg)   BMI Readings from Last 3 Encounters:  10/12/20 25.65 kg/m  10/08/20 25.65 kg/m  09/22/20 26.58 kg/m    Assessment/Interventions: Review of patient past medical history, allergies, medications, health status, including review of consultants reports, laboratory and other test data, was performed as part of comprehensive evaluation and provision of chronic care management services.   SDOH:  (Social Determinants of Health) assessments and interventions  performed: No  SDOH Screenings   Alcohol Screen: Low Risk   . Last Alcohol Screening Score (AUDIT): 3  Depression (PHQ2-9): Low Risk   . PHQ-2 Score: 0  Financial Resource Strain: Low Risk   . Difficulty of Paying Living Expenses: Not hard at all  Food Insecurity: No Food Insecurity  . Worried About Charity fundraiser in the Last Year: Never true  . Ran Out of Food in the Last Year: Never true  Housing: Low Risk   . Last Housing Risk Score: 0  Physical Activity: Sufficiently Active  . Days of Exercise per Week: 7 days  . Minutes of Exercise per Session: 40 min  Social Connections: Moderately Isolated  . Frequency of Communication with Friends and Family: More than three times a week  . Frequency of Social Gatherings with Friends and Family: More than three times a week  . Attends  Religious Services: More than 4 times per year  . Active Member of Clubs or Organizations: No  . Attends Archivist Meetings: Never  . Marital Status: Widowed  Stress: No Stress Concern Present  . Feeling of Stress : Not at all  Tobacco Use: Low Risk   . Smoking Tobacco Use: Never Smoker  . Smokeless Tobacco Use: Never Used  Transportation Needs: No Transportation Needs  . Lack of Transportation (Medical): No  . Lack of Transportation (Non-Medical): No   Patient reports her knee is not doing as well as last time as she had surgery on the second one. Patient is having a lot of pain and called in for oxycodone to refill. Recommended to call the [place] office for refills.  Patient could hardly walk after the PT visit on Monday.   CCM Care Plan  Allergies  Allergen Reactions  . Crestor [Rosuvastatin] Other (See Comments)    Myalgia on 5 mg per day  . Erythromycin Nausea And Vomiting    Oral  No hives rash or itching  . Mobic [Meloxicam] Other (See Comments)    Moody and irritable     Medications Reviewed Today    Reviewed by Octavia Bruckner, RN (Registered Nurse) on 10/12/20 at  1027  Med List Status: Complete  Medication Order Taking? Sig Documenting Provider Last Dose Status Informant  amLODipine (NORVASC) 5 MG tablet [id number] Yes Take 1 tablet (5 mg total) by mouth daily.  Patient taking differently: Take 5 mg by mouth in the morning.   [name], MD 10/12/2020 Unknown time Active   Calcium Carb-Cholecalciferol (CALCIUM + D3 PO) [id number] Yes Take 1 tablet by mouth in the morning and at bedtime. [provider] Past Week Unknown time Active Self  cetirizine (ZYRTEC) 10 MG tablet [id number] Yes Take 10 mg by mouth daily as needed for allergies.  [provider] Past Week Unknown time Active Self  diclofenac Sodium (VOLTAREN) 1 % GEL [id number] Yes Apply 1 application topically 4 (four) times daily as needed (sore knees/knee pain.). [provider] Past Week Unknown time Active Self  diphenhydrAMINE HCl, Sleep, 25 MG TBDP [id number] Yes Take 25 mg by mouth at bedtime as needed (sleep). [provider] Past Month Unknown time Active Self  DULoxetine (CYMBALTA) 60 MG capsule [id number] Yes TAKE ONE CAPSULE BY MOUTH TWICE DAILY  Patient taking differently: Take 60 mg by mouth 2 (two) times daily.   [name], MD 10/12/2020 Unknown time Active Self  ezetimibe (ZETIA) 10 MG tablet [id number] Yes TAKE ONE TABLET BY MOUTH ONE TIME DAILY  Patient taking differently: Take 10 mg by mouth at bedtime.   Panosh, Standley Brooking, MD  Active Self  fluticasone (FLONASE) 50 MCG/ACT nasal spray [id number] Yes Place 2 sprays into both nostrils daily.  Patient taking differently: Place 2 sprays into both nostrils daily as needed for allergies.   Collene Gobble, MD  Active Self  gabapentin (NEURONTIN) 300 MG capsule [id number] Yes take 1 capsule by mouth in the morning and 2 capsules at bedtime or as directed  Patient taking differently: Take 300-600 mg by mouth See admin instructions. Take 1 capsule (300 mg) by mouth in the morning & take 2 capsules (600 mg)  by mouth at night.   [name], MD 10/12/2020 Unknown time Active Self  losartan (COZAAR) 100 MG tablet [id number] Yes Take 1 tablet (100 mg total) by mouth daily.  Patient taking differently: Take 100 mg by mouth in  the morning.   [name], MD 10/12/2020 Unknown time Active   Magnesium Oxide (MAG-200) 200 MG TABS [id number] Yes Take 200 mg by mouth daily in the afternoon. [provider] Past Week Unknown time Active Self  mirtazapine (REMERON) 7.5 MG tablet [id number] Yes Take 1 tablet (7.5 mg total) by mouth at bedtime. May increase to 15 mg at night  Patient taking differently: Take 15 mg by mouth at bedtime as needed (sleep).   Panosh, Standley Brooking, MD Past Month Unknown time Active   Multiple Vitamin (MULTIVITAMIN WITH MINERALS) TABS tablet [id number] Yes Take 1 tablet by mouth daily. One-A-Day [provider] Past Week Unknown time Active Self  omeprazole (PRILOSEC) 20 MG capsule [id number] Yes Take 20 mg by mouth daily as needed (acid indigestion/heartburn/GERD). [provider] Past Week Unknown time Active Self  Polyethyl Glycol-Propyl Glycol (LUBRICANT EYE DROPS) 0.4-0.3 % SOLN [id number] Yes Place 1 drop into both eyes 3 (three) times daily as needed (dry/irritated eyes.). [provider] Past Week Unknown time Active Self  polyethylene glycol powder (GLYCOLAX/MIRALAX) 17 GM/SCOOP powder [id number] Yes 1 cap full in a full glass of water, two times a day for 3 days.  Patient taking differently: Take 17 g by mouth daily as needed (constipation). 1 cap full in a full glass of water, two times a day for 3 days.   Carrie Mew, MD Past Week Unknown time Active   psyllium (METAMUCIL SMOOTH TEXTURE) 28 % packet [id number] Yes Take 1 packet by mouth daily as needed (constipation). [provider] Past Week Unknown time Active Self  risedronate (ACTONEL) 35 MG tablet [id number] Yes take 1 tablet by mouth weekly with water on an empty stomach **do not  eat drink or lie down for the next 67mns  Patient taking differently: Take 35 mg by mouth every Monday. take 1 tablet by mouth weekly with water on an empty stomach **do not eat drink or lie down for the next 315ms   Panosh, WaStandley BrookingMD Past Week Unknown time Active   zolpidem (AMBIEN) 5 MG tablet [id number] TAKE ONE TABLET BY MOUTH AT BEDTIME AS NEEDED for sleep *avoid regular use*  Patient not taking: Reported on 10/07/2020   Panosh, WaStandley BrookingMD Not Taking Unknown time Consider Medication Status and Discontinue (Patient Preference) Self          Patient Active Problem List   Diagnosis Date Noted  . Primary osteoarthritis of left knee 10/12/2020  . OA (osteoarthritis) of knee 07/29/2019  . S/P total knee arthroplasty, right 07/29/2019  . Osteoporosis without current pathological fracture 06/19/2018  . Allergic rhinitis 12/27/2016  . Chronic sinusitis 12/23/2016  . Memory loss 12/04/2016  . Attention deficit 12/04/2016  . Hx of bacterial pneumonia 02/23/2015  . Hx of adenomatous colonic polyps 11/28/2014  . Muscular deconditioning 11/06/2014  . Body aches 04/02/2014  . Hoarseness 04/02/2014  . Hyperlipidemia 04/02/2014  . Urinary urgency 04/02/2014  . Cough, persistent 04/02/2014  . Medication management 01/21/2014  . Internal nasal lesion 12/26/2013  . Right nasal polyps  ?  12/26/2013  . Visit for preventive health examination 07/15/2013  . Essential hypertension 07/15/2013  . Acute medial meniscal tear 01/29/2013  . Knee pain 01/28/2013  . GERD (gastroesophageal reflux disease) 04/05/2012  . Episode of generalized weakness 01/12/2012  . Otalgia of both ears 01/12/2012  . Tremor intermittent intention 01/12/2012  . Chronic throat clearing 09/25/2011  . Hearing loss 03/05/2010  . ACQUIRED MUSCULOSKELETAL DEFORMITY OTH SPEC  SITE 06/19/2009  . SKIN CANCER, HX OF 11/03/2008  . BACK PAIN, LUMBAR 06/27/2007  . IRON DEFICIENCY 03/27/2007  . BACK PAIN, THORACIC REGION  03/27/2007  . OSTEOPENIA 12/17/2006  . HYPERLIPIDEMIA 10/24/2006  . DEPRESSION 10/24/2006  . ADHD 10/24/2006  . 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 Hereditary and idiopathic peripheral neuropathy 10/24/2006  . BRONCHIECTASIS 10/24/2006  . GERD 10/24/2006  . Diverticulosis of colon (without mention of hemorrhage) 10/24/2006  . CHILBLAINS 10/24/2006  . ACNE ROSACEA, HX OF 10/24/2006    Immunization History  Administered Date(s) Administered  . Influenza Split 03/21/2011, 04/05/2012  . Influenza Whole 03/27/2007, 03/25/2008, 03/12/2009, 03/05/2010  . Influenza, High Dose Seasonal PF 02/23/2015, 04/12/2016, 02/23/2017, 03/22/2018, 05/06/2020  . Influenza,inj,Quad PF,6+ Mos 04/18/2013, 04/02/2014  . Influenza,inj,quad, With Preservative 04/06/2017, 04/06/2018, 03/07/2019  . Influenza-Unspecified 04/15/2020  . PFIZER(Purple Top)SARS-COV-2 Vaccination 06/13/2019, 07/04/2019, 04/08/2020  . Pneumococcal Conjugate-13 07/15/2013  . Pneumococcal Polysaccharide-23 03/27/2007  . Td 05/06/1996, 03/05/2010  . Tdap 08/08/2018  . Unspecified SARS-COV-2 Vaccination 07/16/2019  . Zoster Recombinat (Shingrix) 10/05/2017, 01/29/2018  . Zoster, Live 03/05/2010    Conditions to be addressed/monitored:  Hypertension, Hyperlipidemia, GERD, Osteoporosis, Osteoarthritis, Allergic Rhinitis and Insomnia, Nerve Pain, Constipation  Care Plan : [place]  Updates made by Viona Gilmore, [place] since 11/03/2020 12:00 AM    Problem: Problem: Hypertension, Hyperlipidemia, GERD, Osteoporosis, Osteoarthritis, Allergic Rhinitis and Insomnia, Nerve Pain, Constipation     Long-Range Goal: Patient-Specific Goal   Start Date: 10/23/2020  Expected End Date: 10/23/2021  This Visit's Progress: On track  Priority: High  Note:   Current Barriers:  . Unable to independently monitor therapeutic efficacy  Pharmacist Clinical Goal(s):  Marland Kitchen Patient will achieve adherence to monitoring  guidelines and medication adherence to achieve therapeutic efficacy . achieve control of cholesterol as evidenced by next lipid panel  through collaboration with PharmD and provider.   Interventions: . 1:1 collaboration with Panosh, Standley Brooking, MD regarding development and update of comprehensive plan of care as evidenced by provider attestation and co-signature . Inter-disciplinary care team collaboration (see longitudinal plan of care) . Comprehensive medication review performed; medication list updated in electronic medical record  Hypertension (BP goal <140/90) -Uncontrolled -Current treatment: . Losartan 100 mg daily . Amlodipine 5 mg daily -Medications previously tried: none  -Current home readings: 113/70 -Current dietary habits: did not discuss -Current exercise habits: limited due to current pain level -Denies hypotensive/hypertensive symptoms -Educated on Exercise goal of 150 minutes per week; Importance of home blood pressure monitoring; Proper BP monitoring technique; -Counseled to monitor BP at home weekly, document, and provide log at future appointments -Counseled on diet and exercise extensively Recommended to continue current medication  Hyperlipidemia: (LDL goal < 100) -Uncontrolled -Current treatment:  Zetia 44m, 1 tablet daily -Medications previously tried: pravastatin, rosuvastatin (statin intolerance)  -Current dietary patterns: doesn't eat fried foods very often; lots of fruits and vegetables, canola or olive oil -Current exercise habits: limited due to current pain -Educated on Cholesterol goals;  Benefits of statin for ASCVD risk reduction; Importance of limiting foods high in cholesterol; Exercise goal of 150 minutes per week; -Counseled on diet and exercise extensively Recommended to continue current medication Recommended for patient to consider lipid clinic referral depending on results of next lipid panel.  Osteopenia (Goal prevent  fractures) -Uncontrolled -Last DEXA Scan: 07/06/20   T-Score femoral neck: -1.9  T-Score total hip: n/a  T-Score lumbar spine: n/a  T-Score forearm radius: n/a  10-year probability  of major osteoporotic fracture: 15%  10-year probability of hip fracture: 4.1% -Patient is a candidate for pharmacologic treatment due to T-Score -1.0 to -2.5 and 10-year risk of hip fracture > 3% -Current treatment   risedronate 92m, 1 tablet weekly (started 11/2018)  citracal/ vitamin D3 660/263m 2 tabs daily  (1000 units daily) -Medications previously tried: none  -Recommend [id number] units of vitamin D daily. Recommend 1200 mg of calcium daily from dietary and supplemental sources. Counseled on oral bisphosphonate administration: take in the morning, 30 minutes prior to food with 6-8 oz of water. Do not lie down for at least 30 minutes after taking. Recommend weight-bearing and muscle strengthening exercises for building and maintaining bone density. -Counseled on diet and exercise extensively Recommended to continue current medication  Insomnia (Goal: improve quality and quantity of sleep) -Uncontrolled -Current treatment   Ambien 5 mg 1 tablet at bedtime  Mirtazapine 15 mg 1 tablet daily at bedtime as needed -Medications previously tried: Ambien (tolerance), Belsomra (ineffective), melatonin (ineffective) -Counseled on practicing good sleep hygiene by setting a sleep schedule and maintaining it, avoid excessive napping, following a nightly routine, avoiding screen time for 30-60 minutes before going to bed, and making the bedroom a cool, quiet and dark space  Allergic rhinitis (Goal: minimize symptoms) -Controlled -Current treatment   cetirizine 1019m1 tablet daily as needed   fluticasone nasal spray (51m73mactuation), 2 sprays into both nostrils daily   Mucinex 400mg42mtablet twice daily  -Medications previously tried: Claritin -Recommended to continue current medication  GERD (Goal:  minimize symptoms) -Controlled -Current treatment  . omeprazole 40 mg, 1 capsule daily (only when symptomatic) -Medications previously tried: none  -Recommended to continue current medication  Nerve pain (Goal: minimize pain) -Controlled -Current treatment   duloxetine 60 mg, 1 capsule twice daily   gabapentin 300 mg, 1 capsule in AM and 2 capsules at bedtime -Medications previously tried: none  -Recommended to continue current medication  Osteoarthritis (Goal: minimize pain) -Uncontrolled -Current treatment   diclofenac gel 1%, uses BID for arthritis pain   Aleve 220 mg 1 tablets 2-3 nights per week  Tylenol 500 mg 3 tablets daily  Oxycodone every 6 hours (due to pain from surgery) -Medications previously tried: n/a -Recommended to continue current medication  Constipation (Goal: regular bowel movements) -Controlled -Current treatment   senna-docusate 8.6-51mg, 2 tablets twice daily (only when needed)    Miralax 17 g a couple times a week -Medications previously tried: none  -Recommended to continue current medication Counseled on drinking plenty of fluids and increasing fiber intake.   Health Maintenance -Vaccine gaps: none -Current therapy:  . No medications -Educated on Cost vs benefit of each product must be carefully weighed by individual consumer -Patient is satisfied with current therapy and denies issues -Recommended to continue as is  Patient Goals/Self-Care Activities . Patient will:  - take medications as prescribed check blood pressure weekly, document, and provide at future appointments target a minimum of 150 minutes of moderate intensity exercise weekly  Follow Up Plan: Telephone follow up appointment with care management team member scheduled for:6 months      Medication Assistance: None required.  Patient affirms current coverage meets needs.  Patient's [facility] pharmacy is:  [id number] [place] [id number]: [phone])[place]- [place]  [place] [address] Phone: [phone] Fax: [phone]  [id number] - [place], [place] - [address] [place] [address] Phone: [phone] Fax: [place], [place] [address] Phone: [phone] Fax: [phone]  Uses pill box? Yes Pt endorses 100% compliance  We discussed: Current pharmacy is preferred with insurance plan and patient is satisfied with pharmacy services Patient decided to: Continue current medication management strategy  Care Plan and Follow Up Patient Decision:  Patient agrees to Care Plan and Follow-up.  Plan: Telephone follow up appointment with care management team member scheduled for:  6 months  Jeni Salles, PharmD [place] Pharmacist [place] [facility] [place] [phone]

## 2020-10-28 DIAGNOSIS — M25562 Pain in left knee: Secondary | ICD-10-CM | POA: Diagnosis not present

## 2020-10-28 DIAGNOSIS — R2689 Other abnormalities of gait and mobility: Secondary | ICD-10-CM | POA: Diagnosis not present

## 2020-10-30 DIAGNOSIS — R2689 Other abnormalities of gait and mobility: Secondary | ICD-10-CM | POA: Diagnosis not present

## 2020-10-30 DIAGNOSIS — M25562 Pain in left knee: Secondary | ICD-10-CM | POA: Diagnosis not present

## 2020-11-03 DIAGNOSIS — M25562 Pain in left knee: Secondary | ICD-10-CM | POA: Diagnosis not present

## 2020-11-03 DIAGNOSIS — R2689 Other abnormalities of gait and mobility: Secondary | ICD-10-CM | POA: Diagnosis not present

## 2020-11-03 NOTE — Patient Instructions (Addendum)
Hi Jyla,  It was great speaking with you again! Below is a summary of some of the topics we discussed.   Please reach out to me if you have any questions or need anything before our follow up!  Best, Maddie  Jeni Salles, PharmD, [facility]  Visit Information  Goals Addressed   None    Patient Care Plan: CCM Pharmacy Care Plan    Problem Identified: Problem: Hypertension, Hyperlipidemia, GERD, Osteoporosis, Osteoarthritis, Allergic Rhinitis and Insomnia, Nerve Pain, Constipation     Long-Range Goal: Patient-Specific Goal   Start Date: 10/23/2020  Expected End Date: 10/23/2021  This Visit's Progress: On track  Priority: High  Note:   Current Barriers:  . Unable to independently monitor therapeutic efficacy  Pharmacist Clinical Goal(s):  Marland Kitchen Patient will achieve adherence to monitoring guidelines and medication adherence to achieve therapeutic efficacy . achieve control of cholesterol as evidenced by next lipid panel  through collaboration with PharmD and provider.   Interventions: . 1:1 collaboration with Panosh, Standley Brooking, MD regarding development and update of comprehensive plan of care as evidenced by provider attestation and co-signature . Inter-disciplinary care team collaboration (see longitudinal plan of care) . Comprehensive medication review performed; medication list updated in electronic medical record  Hypertension (BP goal <140/90) -Uncontrolled -Current treatment: . Losartan 100 mg daily . Amlodipine 5 mg daily -Medications previously tried: none  -Current home readings: 113/70 -Current dietary habits: did not discuss -Current exercise habits: limited due to current pain level -Denies hypotensive/hypertensive symptoms -Educated on Exercise goal of 150 minutes per week; Importance of home blood pressure monitoring; Proper BP monitoring technique; -Counseled to monitor BP at home weekly,  document, and provide log at future appointments -Counseled on diet and exercise extensively Recommended to continue current medication  Hyperlipidemia: (LDL goal < 100) -Uncontrolled -Current treatment:  Zetia 10mg , 1 tablet daily -Medications previously tried: pravastatin, rosuvastatin (statin intolerance)  -Current dietary patterns: doesn't eat fried foods very often; lots of fruits and vegetables, canola or olive oil -Current exercise habits: limited due to current pain -Educated on Cholesterol goals;  Benefits of statin for ASCVD risk reduction; Importance of limiting foods high in cholesterol; Exercise goal of 150 minutes per week; -Counseled on diet and exercise extensively Recommended to continue current medication Recommended for patient to consider lipid clinic referral depending on results of next lipid panel.  Osteopenia (Goal prevent fractures) -Uncontrolled -Last DEXA Scan: 07/06/20   T-Score femoral neck: -1.9  T-Score total hip: n/a  T-Score lumbar spine: n/a  T-Score forearm radius: n/a  10-year probability of major osteoporotic fracture: 15%  10-year probability of hip fracture: 4.1% -Patient is a candidate for pharmacologic treatment due to T-Score -1.0 to -2.5 and 10-year risk of hip fracture > 3% -Current treatment   risedronate 35mg , 1 tablet weekly (started 11/2018)  citracal/ vitamin D3 660/25mg , 2 tabs daily  (1000 units daily) -Medications previously tried: none  -Recommend [phone] units of vitamin D daily. Recommend 1200 mg of calcium daily from dietary and supplemental sources. Counseled on oral bisphosphonate administration: take in the morning, 30 minutes prior to food with 6-8 oz of water. Do not lie down for at least 30 minutes after taking. Recommend weight-bearing and muscle strengthening exercises for building and maintaining bone density. -Counseled on diet and exercise extensively Recommended to continue current medication  Insomnia (Goal:  improve quality and quantity of sleep) -Uncontrolled -Current treatment   Ambien 5 mg 1 tablet at bedtime  Mirtazapine 15 mg 1 tablet daily at bedtime as needed -Medications previously tried: Ambien (tolerance), Belsomra (ineffective), melatonin (ineffective) -Counseled on practicing good sleep hygiene by setting a sleep schedule and maintaining it, avoid excessive napping, following a nightly routine, avoiding screen time for 30-60 minutes before going to bed, and making the bedroom a cool, quiet and dark space  Allergic rhinitis (Goal: minimize symptoms) -Controlled -Current treatment   cetirizine 10mg , 1 tablet daily as needed   fluticasone nasal spray (26mcg/ actuation), 2 sprays into both nostrils daily   Mucinex 400mg , 1 tablet twice daily  -Medications previously tried: Claritin -Recommended to continue current medication  GERD (Goal: minimize symptoms) -Controlled -Current treatment  . omeprazole 40 mg, 1 capsule daily (only when symptomatic) -Medications previously tried: none  -Recommended to continue current medication  Nerve pain (Goal: minimize pain) -Controlled -Current treatment   duloxetine 60 mg, 1 capsule twice daily   gabapentin 300 mg, 1 capsule in AM and 2 capsules at bedtime -Medications previously tried: none  -Recommended to continue current medication  Osteoarthritis (Goal: minimize pain) -Uncontrolled -Current treatment   diclofenac gel 1%, uses BID for arthritis pain   Aleve 220 mg 1 tablets 2-3 nights per week  Tylenol 500 mg 3 tablets daily  Oxycodone every 6 hours (due to pain from surgery) -Medications previously tried: n/a -Recommended to continue current medication  Constipation (Goal: regular bowel movements) -Controlled -Current treatment   senna-docusate 8.6-50mg , 2 tablets twice daily (only when needed)    Miralax 17 g a couple times a week -Medications previously tried: none  -Recommended to continue current  medication Counseled on drinking plenty of fluids and increasing fiber intake.   Health Maintenance -Vaccine gaps: none -Current therapy:  . No medications -Educated on Cost vs benefit of each product must be carefully weighed by individual consumer -Patient is satisfied with current therapy and denies issues -Recommended to continue as is  Patient Goals/Self-Care Activities . Patient will:  - take medications as prescribed check blood pressure weekly, document, and provide at future appointments target a minimum of 150 minutes of moderate intensity exercise weekly  Follow Up Plan: Telephone follow up appointment with care management team member scheduled for:6 months       Patient verbalizes understanding of instructions provided today and agrees to view in [place].  Telephone follow up appointment with pharmacy team member scheduled for: 6 months  Viona Gilmore, [facility]

## 2020-11-05 DIAGNOSIS — M25562 Pain in left knee: Secondary | ICD-10-CM | POA: Diagnosis not present

## 2020-11-05 DIAGNOSIS — R2689 Other abnormalities of gait and mobility: Secondary | ICD-10-CM | POA: Diagnosis not present

## 2020-11-09 DIAGNOSIS — M25562 Pain in left knee: Secondary | ICD-10-CM | POA: Diagnosis not present

## 2020-11-09 DIAGNOSIS — R2689 Other abnormalities of gait and mobility: Secondary | ICD-10-CM | POA: Diagnosis not present

## 2020-11-12 DIAGNOSIS — R2689 Other abnormalities of gait and mobility: Secondary | ICD-10-CM | POA: Diagnosis not present

## 2020-11-12 DIAGNOSIS — M25562 Pain in left knee: Secondary | ICD-10-CM | POA: Diagnosis not present

## 2020-11-13 ENCOUNTER — Other Ambulatory Visit: Payer: Self-pay | Admitting: Internal Medicine

## 2020-11-17 ENCOUNTER — Other Ambulatory Visit: Payer: Self-pay

## 2020-11-17 DIAGNOSIS — Z96652 Presence of left artificial knee joint: Secondary | ICD-10-CM | POA: Diagnosis not present

## 2020-11-17 DIAGNOSIS — M25562 Pain in left knee: Secondary | ICD-10-CM | POA: Diagnosis not present

## 2020-11-17 DIAGNOSIS — Z471 Aftercare following joint replacement surgery: Secondary | ICD-10-CM | POA: Diagnosis not present

## 2020-11-17 DIAGNOSIS — R2689 Other abnormalities of gait and mobility: Secondary | ICD-10-CM | POA: Diagnosis not present

## 2020-11-18 ENCOUNTER — Encounter: Payer: Self-pay | Admitting: Family Medicine

## 2020-11-18 ENCOUNTER — Ambulatory Visit (INDEPENDENT_AMBULATORY_CARE_PROVIDER_SITE_OTHER): Payer: PPO | Admitting: Family Medicine

## 2020-11-18 VITALS — BP 112/62 | HR 85 | Temp 98.0°F | Wt 129.2 lb

## 2020-11-18 DIAGNOSIS — M546 Pain in thoracic spine: Secondary | ICD-10-CM | POA: Diagnosis not present

## 2020-11-18 NOTE — Progress Notes (Signed)
Subjective:    Patient ID: Julie Bonilla, female    DOB: January 15, 1941, 80 y.o.   MRN: [id number]  Chief Complaint  Patient presents with   Back Pain    7-10 days, left side. Sometimes gets lightheaded     HPI Patient was seen today for acute concern.  Patient endorses left mid back pain 1 week or more.  Patient does not recall injury.  Pain is described as a constant sensation worse with certain bending movements.  Patient has been taking it easy for the most part as she had replacement in May.  Patient's twin sister and her daughter are helping her around the house.  Patient taking her regular medications for her symptoms.  Denies nausea, vomiting, chest pain, SOB, fever, chills, constipation, suprapubic pain, dysuria, loss of bowel or bladder.  Drinking a glass of nearly hourly.  Past Medical History:  Diagnosis Date   ADHD (attention deficit hyperactivity disorder)    prob adhd/ld   Allergy    SEASONAL   Anemia    history of   Anxiety    Arthritis    knees, hips   BRONCHIECTASIS 10/24/2006   Qualifier: Diagnosis of  By: Regis Bill MD, Standley Brooking    Bunion 03/24/2011   repaired [name] feet   CARCINOMA, BASAL CELL 10/24/2006   Qualifier: Diagnosis of  By: Hulan Saas, CMA (AAMA), [place] Skye    Constipation    at times   Depression    DENNIES   Diverticulosis    Family history of adverse reaction to anesthesia    sister was slow to wake up   GERD (gastroesophageal reflux disease)    Hard of hearing    no hearing aids   History of skin cancer    basal cell face and back    Hx of adenomatous colonic polyps 11/28/2014   Hyperlipidemia    Hypertension    [place]    Hx of struck when age 1 is a twin   Lower GI bleed 11/2014   post-polypectomy   Osteopenia 11 06   dexa    Peripheral neuropathy    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   Pneumonia    hx of   RLS (restless legs syndrome)    poss    Allergies  Allergen Reactions   Crestor [Rosuvastatin] Other (See Comments)    Myalgia on  5 mg per day   Erythromycin Nausea And Vomiting    Oral  No hives rash or itching   Mobic [Meloxicam] Other (See Comments)    Moody and irritable     ROS General: Denies fever, chills, night sweats, changes in weight, changes in appetite HEENT: Denies headaches, ear pain, changes in vision, rhinorrhea, sore throat CV: Denies CP, palpitations, SOB, orthopnea Pulm: Denies SOB, cough, wheezing GI: Denies abdominal pain, nausea, vomiting, diarrhea, constipation GU: Denies dysuria, hematuria, frequency, vaginal discharge Msk: Denies muscle cramps, joint pains  +L mid back pain Neuro: Denies weakness, numbness, tingling Skin: Denies rashes, bruising Psych: Denies depression, anxiety, hallucinations    Objective:    Blood pressure 112/62, pulse 85, temperature 98 F (36.7 C), temperature source Oral, weight 129 lb 3.2 oz (58.6 kg), SpO2 95 %.  Gen. Pleasant, well-nourished, in no distress, normal affect   HEENT: [place]/AT, face symmetric, conjunctiva clear, no scleral icterus, PERRLA, EOMI, nares patent without drainage Lungs: no accessory muscle use, CTAB, no wheezes or rales Cardiovascular: RRR, no m/r/g, no peripheral edema Musculoskeletal: No TTP of cervical, thoracic, lumbar, paraspinal muscles.  Pain of mid thoracic back inferior to scapula with extension, flexion, left lateral bending of spine.  No deformities, no cyanosis or clubbing, normal tone Neuro:  A&Ox3, CN II-XII intact, normal gait Skin:  Warm, no lesions/ rash   Wt Readings from Last 3 Encounters:  11/18/20 129 lb 3.2 oz (58.6 kg)  10/12/20 127 lb (57.6 kg)  10/08/20 127 lb (57.6 kg)    Lab Results  Component Value Date   WBC 12.2 (H) 10/13/2020   HGB 10.5 (L) 10/13/2020   HCT 33.0 (L) 10/13/2020   PLT 222 10/13/2020   GLUCOSE 136 (H) 10/13/2020   CHOL 230 (H) 09/22/2020   TRIG 134.0 09/22/2020   HDL 62.40 09/22/2020   LDLDIRECT 149.0 05/15/2019   LDLCALC 141 (H) 09/22/2020   ALT 21 10/08/2020   AST 21  10/08/2020   NA 138 10/13/2020   K 4.6 10/13/2020   CL 108 10/13/2020   CREATININE 0.76 10/13/2020   BUN 14 10/13/2020   CO2 24 10/13/2020   TSH 1.37 09/22/2020   INR 1.0 10/08/2020   HGBA1C 5.5 09/22/2020    Assessment/Plan:  Acute left-sided thoracic back pain -Discussed treatment of symptoms with supportive care including heat, ice, Tylenol, topical analgesics, massage, stretching -Patient to use OTC Biofreeze -We will use muscle relaxers remaining from recent TKR -Given stretching/back exercises -For continued or worsening symptoms consider PT  F/u prn  Grier Mitts, MD

## 2020-11-20 DIAGNOSIS — R2689 Other abnormalities of gait and mobility: Secondary | ICD-10-CM | POA: Diagnosis not present

## 2020-11-20 DIAGNOSIS — M25562 Pain in left knee: Secondary | ICD-10-CM | POA: Diagnosis not present

## 2020-11-26 DIAGNOSIS — Z85828 Personal history of other malignant neoplasm of skin: Secondary | ICD-10-CM | POA: Diagnosis not present

## 2020-11-26 DIAGNOSIS — L820 Inflamed seborrheic keratosis: Secondary | ICD-10-CM | POA: Diagnosis not present

## 2020-11-26 DIAGNOSIS — L570 Actinic keratosis: Secondary | ICD-10-CM | POA: Diagnosis not present

## 2020-12-16 ENCOUNTER — Telehealth: Payer: Self-pay | Admitting: Pharmacist

## 2020-12-16 NOTE — Chronic Care Management (AMB) (Signed)
   Chronic Care Management Pharmacy Assistant   Name: DARLENY SEM  MRN: [id number] DOB: [birth year]  Unable to reach patient by phone.   Care Gaps:  AWV - message sent to Ramond Craver CMA to schedule.  COVID 19 - BOOSTER 5 overdue since 08/06/20.  Star Rating Drugs:  Losartan 100mg  - last filled on 11/13/20 90DS at [place] Pharmacist Assistant [phone]

## 2020-12-22 NOTE — Telephone Encounter (Cosign Needed)
3rd attwmpt

## 2020-12-31 ENCOUNTER — Telehealth: Payer: Self-pay | Admitting: Internal Medicine

## 2020-12-31 NOTE — Telephone Encounter (Signed)
Left message for patient to call back and schedule Medicare Annual Wellness Visit (AWV) either virtually or in office.   Last AWV 01/14/20  please schedule at anytime with [facility] Nurse Health Advisor 1 or 2   This should be a 45 minute visit.

## 2021-01-02 ENCOUNTER — Other Ambulatory Visit: Payer: Self-pay | Admitting: Internal Medicine

## 2021-01-07 ENCOUNTER — Telehealth: Payer: Self-pay | Admitting: Pharmacist

## 2021-01-07 NOTE — Chronic Care Management (AMB) (Signed)
Chronic Care Management Pharmacy Assistant   Name: Julie Bonilla  MRN: [id number] DOB: 11-18-1940   Reason for Encounter: Disease State/ Hypertension Assessment Call.   Conditions to be addressed/monitored: HTN  Recent office visits:  None.   Recent consult visits:  None.  Hospital visits:  None in previous 6 months  Medications: Outpatient Encounter Medications as of 01/07/2021  Medication Sig   amLODipine (NORVASC) 5 MG tablet TAKE ONE TABLET BY MOUTH ONE TIME DAILY   Calcium Carb-Cholecalciferol (CALCIUM + D3 PO) Take 1 tablet by mouth in the morning and at bedtime.   cetirizine (ZYRTEC) 10 MG tablet Take 10 mg by mouth daily as needed for allergies.    diphenhydrAMINE HCl, Sleep, 25 MG TBDP Take 25 mg by mouth at bedtime as needed (sleep).   DULoxetine (CYMBALTA) 60 MG capsule TAKE ONE CAPSULE BY MOUTH TWICE DAILY   ezetimibe (ZETIA) 10 MG tablet TAKE ONE TABLET BY MOUTH ONE TIME DAILY   fluticasone (FLONASE) 50 MCG/ACT nasal spray Place 2 sprays into both nostrils daily. (Patient taking differently: Place 2 sprays into both nostrils daily as needed for allergies.)   gabapentin (NEURONTIN) 300 MG capsule Take 1 capsule by mouth in the morning and 2 capsules at bedtime or as directed   losartan (COZAAR) 100 MG tablet Take 1 tablet (100 mg total) by mouth daily. (Patient taking differently: Take 100 mg by mouth in the morning.)   magic mouthwash (nystatin, hydrocortisone, diphenhydrAMINE) suspension Swish and spit 5 mLs 4 (four) times daily as needed for mouth pain.   Magnesium Oxide (MAG-200) 200 MG TABS Take 200 mg by mouth daily in the afternoon.   methocarbamol (ROBAXIN) 500 MG tablet Take 1 tablet (500 mg total) by mouth every 6 (six) hours as needed for muscle spasms.   mirtazapine (REMERON) 7.5 MG tablet TAKE ONE TABLET BY MOUTH DAILY AT NIGHT (MAY INCREASE TO TWO TABLETS AT NIGHT)   Multiple Vitamin (MULTIVITAMIN WITH MINERALS) TABS tablet Take 1 tablet by mouth  daily. One-A-Day   omeprazole (PRILOSEC) 20 MG capsule Take 20 mg by mouth daily as needed (acid indigestion/heartburn/GERD).   oxyCODONE (OXY IR/ROXICODONE) 5 MG immediate release tablet Take 1-2 tablets (5-10 mg total) by mouth every 6 (six) hours as needed for moderate pain or severe pain.   Polyethyl Glycol-Propyl Glycol (LUBRICANT EYE DROPS) 0.4-0.3 % SOLN Place 1 drop into both eyes 3 (three) times daily as needed (dry/irritated eyes.).   polyethylene glycol powder (GLYCOLAX/MIRALAX) 17 GM/SCOOP powder 1 cap full in a full glass of water, two times a day for 3 days. (Patient taking differently: Take 17 g by mouth daily as needed (constipation). 1 cap full in a full glass of water, two times a day for 3 days.)   psyllium (METAMUCIL SMOOTH TEXTURE) 28 % packet Take 1 packet by mouth daily as needed (constipation).   risedronate (ACTONEL) 35 MG tablet take 1 tablet by mouth weekly with water on an empty stomach **do not eat drink or lie down for the next 27mns (Patient taking differently: Take 35 mg by mouth every Monday. take 1 tablet by mouth weekly with water on an empty stomach **do not eat drink or lie down for the next 398ms)   zolpidem (AMBIEN) 5 MG tablet TAKE ONE TABLET BY MOUTH AT BEDTIME AS NEEDED for sleep *avoid regular use*   No facility-administered encounter medications on file as of 01/07/2021.   Fill History: DULOXETINE HYDROCHLORIDE '60MG'$  CAPSULE DELAYED RELEASE PARTICLES 11/16/2020 90  EZETIMIBE '10MG'$  TABLET 01/04/2021 90   FLUTICASONE PROPIONATE 50MCG/ACT SUSPENSION 09/15/2020 60   GABAPENTIN '300MG'$  CAPSULE 01/04/2021 90   LOSARTAN POTASSIUM '100MG'$  TABLET 11/13/2020 90   METHOCARBAMOL '500MG'$  TABLETS 10/13/2020 10   MIRTAZAPINE 7.'5MG'$  TABLET 11/16/2020 15   NYSTATIN 100000 UNIT/ML SUSPENSION 10/15/2020 27   OMEPRAZOLE '20MG'$  CAPSULE DELAYED RELEASE 01/04/2021 30   RISEDRONATE SODIUM '35MG'$  TABLET 10/12/2020 28   AMLODIPINE BESYLATE '5MG'$  TABLET 01/02/2021 30   OXYCODONE  HYDROCHLORIDE '5MG'$  TABLET 11/09/2020 7    Reviewed chart prior to disease state call. Spoke with patient regarding BP  Recent Office Vitals: BP Readings from Last 3 Encounters:  11/18/20 112/62  10/13/20 (!) 104/57  10/08/20 (!) 146/81   Pulse Readings from Last 3 Encounters:  11/18/20 85  10/13/20 84  10/08/20 74    Wt Readings from Last 3 Encounters:  11/18/20 129 lb 3.2 oz (58.6 kg)  10/12/20 127 lb (57.6 kg)  10/08/20 127 lb (57.6 kg)     Kidney Function Lab Results  Component Value Date/Time   CREATININE 0.76 10/13/2020 03:41 AM   CREATININE 0.68 10/08/2020 11:21 AM   CREATININE 0.84 12/30/2019 10:03 AM   GFR 76.55 09/22/2020 04:13 PM   GFRNONAA >60 10/13/2020 03:41 AM   GFRAA >60 07/30/2019 03:56 AM    BMP Latest Ref Rng & Units 10/13/2020 10/08/2020 09/22/2020  Glucose 70 - 99 mg/dL 136(H) 107(H) 89  BUN 8 - 23 mg/dL '14 20 16  '$ Creatinine 0.44 - 1.00 mg/dL 0.76 0.68 0.74  BUN/Creat Ratio 6 - 22 (calc) - - -  Sodium 135 - 145 mmol/L 138 139 140  Potassium 3.5 - 5.1 mmol/L 4.6 3.9 4.7  Chloride 98 - 111 mmol/L 108 106 104  CO2 22 - 32 mmol/L '24 25 27  '$ Calcium 8.9 - 10.3 mg/dL 8.1(L) 8.8(L) 9.4    Current antihypertensive regimen:  Amlodipine '5mg'$  - take 1 tablet by mouth once daily.  Losartan '100mg'$  - take 1 tablet by mouth daily. How often are you checking your Blood Pressure? daily Current home BP readings: 148/76 P: 74 on 01/13/21 What recent interventions/DTPs have been made by any provider to improve Blood Pressure control since last CPP Visit: None.  Any recent hospitalizations or ED visits since last visit with CPP? No  Adherence Review: Is the patient currently on ACE/ARB medication? Yes Does the patient have >5 day gap between last estimated fill dates? No  Notes: Spoke with patient reviewed medications as listed. Patient reports taking all medication as prescribed. However, patient has not refilled her Ambien in about 3 months and she states the  mirtazapine that Dr. Regis Bill prescribed her is not helping her sleep. She takes two at a time and it still wide awake. Patient reports no issues or side effects from medications at this time. Patient states she does check her blood pressure but she does not keep a log. I encouraged patient to start writing them down and keeping a log. Patient was agreeable. Patient states she currently lives with her sister and spends the summers [place] her sisters [place] house. Patient states every morning she eats a kind bar and has a cup of cappuccino. Then around 11am she makes eggs, bacon and toast or a bagel and some fruit. Patient states she cooks dinner every night and makes something simple like a piece of salmon or chicken with some steamed broccoli or asparagus with a baked potato. Patient stated she drinks 2-3 quarts of water everyday. Patient states they do eat out  on sundays after church. For activity patient states her and her sister try to walk around 4 miles everyday. Patient does leg exercises and once she comes back home for the fall and winter she will start back her water classes. Patient does cooking and cleaning at home everyday as well.   Care Gaps:  AWV - message left for patient to call and schedule. COVID 19 - BOOSTER 5 overdue since 08/06/20.  Star Rating Drugs:  Losartan '100mg'$  - last filled on 11/13/20 90DS at [place] Pharmacist Assistant [phone]

## 2021-01-08 NOTE — Telephone Encounter (Cosign Needed)
2nd attempt

## 2021-01-11 NOTE — Telephone Encounter (Cosign Needed)
3rd attempt

## 2021-01-20 ENCOUNTER — Telehealth: Payer: Self-pay

## 2021-01-20 ENCOUNTER — Other Ambulatory Visit: Payer: Self-pay

## 2021-01-20 NOTE — Telephone Encounter (Signed)
Med list has been updated to reflect dosage change .

## 2021-01-20 NOTE — Telephone Encounter (Signed)
-----   Message from [name], MD sent at 01/19/2021  6:35 PM EDT ----- Regarding: RE: Sleep medication Ok to try 3 x 7.5 at night.  Multiple meds have failed over the years   do not think  any med is going to help that is safe.  But we can try . Please send these messages as an encounter  so we can use as action.  Hillman Attig  or maddie change the med list to reflect change WP ----- Message ----- From: Viona Gilmore, [facility] Sent: 01/18/2021  12:00 AM EDT To: [name], MD Subject: Sleep medication                               Hi,  Ms. Kirchgessner reported that she has been taking 2 of the mirtazapine 7.5 mg tablets at bedtime with no relief and has remained wide awake. What do you think about increasing her dose? I did not discuss this with her yet as I figured it would be better to run it by you first.  Let me know what you think and I would be happy to reach out to her with any changes! I can also ask her to try a higher dose with the current tablets she has for a few weeks before you send in a new rx.  Best, Maddie

## 2021-01-21 NOTE — Progress Notes (Signed)
Chronic Care Management Pharmacy Assistant   Name: Julie Bonilla  MRN: [id number] DOB: [birth year]  Spoke with patient per Jeni Salles to inform patient that her primary care physician Dr. Shanon Ace stated for patient to increase her sleep medication mirtazapine 7.'5mg'$  to 3 tablets at bedtime and see of that increase helps her sleep better. If not patient to call and schedule a follow up with Dr. Regis Bill in a month. Patient aware and verbalized understanding. Patient thanked me for my call.  [place]  Clinical Pharmacist Assistant [phone]   Medications: Outpatient Encounter Medications as of 01/07/2021  Medication Sig   amLODipine (NORVASC) 5 MG tablet TAKE ONE TABLET BY MOUTH ONE TIME DAILY   Calcium Carb-Cholecalciferol (CALCIUM + D3 PO) Take 1 tablet by mouth in the morning and at bedtime.   cetirizine (ZYRTEC) 10 MG tablet Take 10 mg by mouth daily as needed for allergies.    diphenhydrAMINE HCl, Sleep, 25 MG TBDP Take 25 mg by mouth at bedtime as needed (sleep).   DULoxetine (CYMBALTA) 60 MG capsule TAKE ONE CAPSULE BY MOUTH TWICE DAILY   ezetimibe (ZETIA) 10 MG tablet TAKE ONE TABLET BY MOUTH ONE TIME DAILY   fluticasone (FLONASE) 50 MCG/ACT nasal spray Place 2 sprays into both nostrils daily. (Patient taking differently: Place 2 sprays into both nostrils daily as needed for allergies.)   gabapentin (NEURONTIN) 300 MG capsule Take 1 capsule by mouth in the morning and 2 capsules at bedtime or as directed   losartan (COZAAR) 100 MG tablet Take 1 tablet (100 mg total) by mouth daily. (Patient taking differently: Take 100 mg by mouth in the morning.)   magic mouthwash (nystatin, hydrocortisone, diphenhydrAMINE) suspension Swish and spit 5 mLs 4 (four) times daily as needed for mouth pain.   Magnesium Oxide (MAG-200) 200 MG TABS Take 200 mg by mouth daily in the afternoon.   methocarbamol (ROBAXIN) 500 MG tablet Take 1 tablet (500 mg total) by mouth every 6 (six)  hours as needed for muscle spasms.   mirtazapine (REMERON) 7.5 MG tablet TAKE ONE TABLET BY MOUTH DAILY AT NIGHT (MAY INCREASE TO TWO TABLETS AT NIGHT) (Patient taking differently: Take 7.5 mg by mouth in the morning, at noon, and at bedtime.)   Multiple Vitamin (MULTIVITAMIN WITH MINERALS) TABS tablet Take 1 tablet by mouth daily. One-A-Day   omeprazole (PRILOSEC) 20 MG capsule Take 20 mg by mouth daily as needed (acid indigestion/heartburn/GERD).   oxyCODONE (OXY IR/ROXICODONE) 5 MG immediate release tablet Take 1-2 tablets (5-10 mg total) by mouth every 6 (six) hours as needed for moderate pain or severe pain.   Polyethyl Glycol-Propyl Glycol (LUBRICANT EYE DROPS) 0.4-0.3 % SOLN Place 1 drop into both eyes 3 (three) times daily as needed (dry/irritated eyes.).   polyethylene glycol powder (GLYCOLAX/MIRALAX) 17 GM/SCOOP powder 1 cap full in a full glass of water, two times a day for 3 days. (Patient taking differently: Take 17 g by mouth daily as needed (constipation). 1 cap full in a full glass of water, two times a day for 3 days.)   psyllium (METAMUCIL SMOOTH TEXTURE) 28 % packet Take 1 packet by mouth daily as needed (constipation).   risedronate (ACTONEL) 35 MG tablet take 1 tablet by mouth weekly with water on an empty stomach **do not eat drink or lie down for the next 4mns (Patient taking differently: Take 35 mg by mouth every Monday. take 1 tablet by mouth weekly with water on an empty stomach **  do not eat drink or lie down for the next 26mns)   zolpidem (AMBIEN) 5 MG tablet TAKE ONE TABLET BY MOUTH AT BEDTIME AS NEEDED for sleep *avoid regular use*   No facility-administered encounter medications on file as of 01/07/2021.

## 2021-01-27 ENCOUNTER — Telehealth: Payer: Self-pay | Admitting: Internal Medicine

## 2021-01-27 NOTE — Telephone Encounter (Signed)
Left message for patient to call back and schedule Medicare Annual Wellness Visit (AWV) either virtually or in office.  Left  my Herbie Drape number [phone]   Last AWV 01/14/20  please schedule at anytime with [facility] 1 or 2   This should be a 45 minute visit.

## 2021-01-28 ENCOUNTER — Telehealth: Payer: Self-pay | Admitting: Internal Medicine

## 2021-01-28 NOTE — Telephone Encounter (Signed)
Pt call and stated she need a refill on amLODipine (NORVASC) 5 MG tablet [ sent to  [facility] # [address] [place] Phone:  [phone]

## 2021-01-29 MED ORDER — AMLODIPINE BESYLATE 5 MG PO TABS: 5.0000 mg | ORAL_TABLET | Freq: Every day | ORAL | 0 refills | Status: DC

## 2021-01-29 NOTE — Telephone Encounter (Signed)
Rx has been sent to the pt's pharmacy.

## 2021-02-15 ENCOUNTER — Telehealth: Payer: Self-pay | Admitting: Internal Medicine

## 2021-02-15 DIAGNOSIS — I10 Essential (primary) hypertension: Secondary | ICD-10-CM

## 2021-02-15 MED ORDER — DULOXETINE HCL 60 MG PO CPEP
60.0000 mg | ORAL_CAPSULE | Freq: Two times a day (BID) | ORAL | 0 refills | Status: DC
Start: 2021-02-15 — End: 2021-05-26

## 2021-02-15 MED ORDER — LOSARTAN POTASSIUM 100 MG PO TABS: 100.0000 mg | ORAL_TABLET | Freq: Every day | ORAL | 1 refills | Status: DC

## 2021-02-15 NOTE — Telephone Encounter (Signed)
Rx sent 

## 2021-02-15 NOTE — Telephone Encounter (Signed)
Pt also stated she need a refill on DULoxetine (CYMBALTA) 60 MG capsule  and want them sent to [address] [id number] .

## 2021-02-15 NOTE — Telephone Encounter (Signed)
Pt call and stated she need refills on losartan (COZAAR) 100 MG tablet  and

## 2021-02-16 ENCOUNTER — Telehealth: Payer: Self-pay | Admitting: Pharmacist

## 2021-02-16 NOTE — Chronic Care Management (AMB) (Signed)
Chronic Care Management Pharmacy Assistant   Name: Julie Bonilla  MRN: [id number] DOB: November 10, 1940  Reason for Encounter: Disease State/ Hyperlipidemia Assessment Call.    Conditions to be addressed/monitored: HLD   Recent office visits:  None.  Recent consult visits:  None.  Hospital visits:  None in previous 6 months  Medications: Outpatient Encounter Medications as of 02/16/2021  Medication Sig   amLODipine (NORVASC) 5 MG tablet Take 1 tablet (5 mg total) by mouth daily.   Calcium Carb-Cholecalciferol (CALCIUM + D3 PO) Take 1 tablet by mouth in the morning and at bedtime.   cetirizine (ZYRTEC) 10 MG tablet Take 10 mg by mouth daily as needed for allergies.    diphenhydrAMINE HCl, Sleep, 25 MG TBDP Take 25 mg by mouth at bedtime as needed (sleep).   DULoxetine (CYMBALTA) 60 MG capsule Take 1 capsule (60 mg total) by mouth 2 (two) times daily.   ezetimibe (ZETIA) 10 MG tablet TAKE ONE TABLET BY MOUTH ONE TIME DAILY   fluticasone (FLONASE) 50 MCG/ACT nasal spray Place 2 sprays into both nostrils daily. (Patient taking differently: Place 2 sprays into both nostrils daily as needed for allergies.)   gabapentin (NEURONTIN) 300 MG capsule Take 1 capsule by mouth in the morning and 2 capsules at bedtime or as directed   losartan (COZAAR) 100 MG tablet Take 1 tablet (100 mg total) by mouth daily.   magic mouthwash (nystatin, hydrocortisone, diphenhydrAMINE) suspension Swish and spit 5 mLs 4 (four) times daily as needed for mouth pain.   Magnesium Oxide (MAG-200) 200 MG TABS Take 200 mg by mouth daily in the afternoon.   methocarbamol (ROBAXIN) 500 MG tablet Take 1 tablet (500 mg total) by mouth every 6 (six) hours as needed for muscle spasms.   mirtazapine (REMERON) 7.5 MG tablet TAKE ONE TABLET BY MOUTH DAILY AT NIGHT (MAY INCREASE TO TWO TABLETS AT NIGHT) (Patient taking differently: Take 7.5 mg by mouth in the morning, at noon, and at bedtime.)   Multiple Vitamin (MULTIVITAMIN  WITH MINERALS) TABS tablet Take 1 tablet by mouth daily. One-A-Day   omeprazole (PRILOSEC) 20 MG capsule Take 20 mg by mouth daily as needed (acid indigestion/heartburn/GERD).   oxyCODONE (OXY IR/ROXICODONE) 5 MG immediate release tablet Take 1-2 tablets (5-10 mg total) by mouth every 6 (six) hours as needed for moderate pain or severe pain.   Polyethyl Glycol-Propyl Glycol (LUBRICANT EYE DROPS) 0.4-0.3 % SOLN Place 1 drop into both eyes 3 (three) times daily as needed (dry/irritated eyes.).   polyethylene glycol powder (GLYCOLAX/MIRALAX) 17 GM/SCOOP powder 1 cap full in a full glass of water, two times a day for 3 days. (Patient taking differently: Take 17 g by mouth daily as needed (constipation). 1 cap full in a full glass of water, two times a day for 3 days.)   psyllium (METAMUCIL SMOOTH TEXTURE) 28 % packet Take 1 packet by mouth daily as needed (constipation).   risedronate (ACTONEL) 35 MG tablet take 1 tablet by mouth weekly with water on an empty stomach **do not eat drink or lie down for the next 34mns (Patient taking differently: Take 35 mg by mouth every Monday. take 1 tablet by mouth weekly with water on an empty stomach **do not eat drink or lie down for the next 344ms)   zolpidem (AMBIEN) 5 MG tablet TAKE ONE TABLET BY MOUTH AT BEDTIME AS NEEDED for sleep *avoid regular use*   No facility-administered encounter medications on file as of 02/16/2021.   Fill  History: ASPIRIN '325MG'$  EC TABLETS 10/13/2020 20   DULOXETINE HYDROCHLORIDE '60MG'$  CAPSULE DELAYED RELEASE PARTICLES 02/16/2021 90   EZETIMIBE '10MG'$  TABLET 01/04/2021 90   FLUTICASONE PROPIONATE 50MCG/ACT SUSPENSION 09/15/2020 60   GABAPENTIN '300MG'$  CAPSULE 01/04/2021 90   LOSARTAN POTASSIUM '100MG'$  TABLET 02/15/2021 90   MIRTAZAPINE 7.'5MG'$  TABLET 11/16/2020 15   OMEPRAZOLE '20MG'$  CAPSULE DELAYED RELEASE 01/29/2021 30   RISEDRONATE SODIUM '35MG'$  TABLET 10/12/2020 28   AMLODIPINE BESYLATE '5MG'$  TABLET 01/29/2021 90   Comprehensive  medication review performed; Spoke to patient regarding cholesterol  Lipid Panel    Component Value Date/Time   CHOL 230 (H) 09/22/2020 1613   TRIG 134.0 09/22/2020 1613   HDL 62.40 09/22/2020 1613   LDLCALC 141 (H) 09/22/2020 1613   LDLCALC 152 (H) 12/30/2019 1003   LDLDIRECT 149.0 05/15/2019 1551    10-year ASCVD risk score: The ASCVD Risk score (Arnett DK, et al., 2019) failed to calculate for the following reasons:   The 2019 ASCVD risk score is only valid for ages 47 to 40  Current antihyperlipidemic regimen:  Ezetimibe '10mg'$  - take 1 tablet by mouth once daily.  Previous antihyperlipidemic medications tried:  ASCVD risk enhancing conditions: age >75 and HTN What recent interventions/DTPs have been made by any provider to improve Cholesterol control since last CPP Visit: None. Any recent hospitalizations or ED visits since last visit with CPP? No What diet changes have been made to improve Cholesterol?   What exercise is being done to improve Cholesterol?    Adherence Review: Does the patient have >5 day gap between last estimated fill dates? No  Multiple unsuccessful attempts to reach patient by phone.   Care Gaps:  AWV - message sent to Ramond Craver CMA to schedule.  COVID 19 - BOOSTER 5 overdue since 08/06/20.  Star Rating Drugs:  Losartan '100mg'$  - last filled on 02/15/21 90DS at [facility] Pharmacist Assistant [phone]

## 2021-02-19 NOTE — Telephone Encounter (Cosign Needed)
2ND ATTEMPT

## 2021-02-23 NOTE — Telephone Encounter (Cosign Needed)
3rd attempt

## 2021-03-09 ENCOUNTER — Telehealth: Payer: Self-pay | Admitting: Pharmacist

## 2021-03-09 NOTE — Chronic Care Management (AMB) (Signed)
Chronic Care Management Pharmacy Assistant   Name: Julie Bonilla  MRN: [id number] DOB: January 20, 1941  Reason for Encounter: Disease State/ Hyperlipidemia Assessment Call.    Conditions to be addressed/monitored: HLD   Recent office visits:  None.  Recent consult visits:  None.  Hospital visits:  None in previous 6 months  Medications: Outpatient Encounter Medications as of 03/09/2021  Medication Sig   amLODipine (NORVASC) 5 MG tablet Take 1 tablet (5 mg total) by mouth daily.   Calcium Carb-Cholecalciferol (CALCIUM + D3 PO) Take 1 tablet by mouth in the morning and at bedtime.   cetirizine (ZYRTEC) 10 MG tablet Take 10 mg by mouth daily as needed for allergies.    diphenhydrAMINE HCl, Sleep, 25 MG TBDP Take 25 mg by mouth at bedtime as needed (sleep).   DULoxetine (CYMBALTA) 60 MG capsule Take 1 capsule (60 mg total) by mouth 2 (two) times daily.   ezetimibe (ZETIA) 10 MG tablet TAKE ONE TABLET BY MOUTH ONE TIME DAILY   fluticasone (FLONASE) 50 MCG/ACT nasal spray Place 2 sprays into both nostrils daily. (Patient taking differently: Place 2 sprays into both nostrils daily as needed for allergies.)   gabapentin (NEURONTIN) 300 MG capsule Take 1 capsule by mouth in the morning and 2 capsules at bedtime or as directed   losartan (COZAAR) 100 MG tablet Take 1 tablet (100 mg total) by mouth daily.   magic mouthwash (nystatin, hydrocortisone, diphenhydrAMINE) suspension Swish and spit 5 mLs 4 (four) times daily as needed for mouth pain.   Magnesium Oxide (MAG-200) 200 MG TABS Take 200 mg by mouth daily in the afternoon.   methocarbamol (ROBAXIN) 500 MG tablet Take 1 tablet (500 mg total) by mouth every 6 (six) hours as needed for muscle spasms.   mirtazapine (REMERON) 7.5 MG tablet TAKE ONE TABLET BY MOUTH DAILY AT NIGHT (MAY INCREASE TO TWO TABLETS AT NIGHT) (Patient taking differently: Take 7.5 mg by mouth in the morning, at noon, and at bedtime.)   Multiple Vitamin (MULTIVITAMIN  WITH MINERALS) TABS tablet Take 1 tablet by mouth daily. One-A-Day   omeprazole (PRILOSEC) 20 MG capsule Take 20 mg by mouth daily as needed (acid indigestion/heartburn/GERD).   oxyCODONE (OXY IR/ROXICODONE) 5 MG immediate release tablet Take 1-2 tablets (5-10 mg total) by mouth every 6 (six) hours as needed for moderate pain or severe pain.   Polyethyl Glycol-Propyl Glycol (LUBRICANT EYE DROPS) 0.4-0.3 % SOLN Place 1 drop into both eyes 3 (three) times daily as needed (dry/irritated eyes.).   polyethylene glycol powder (GLYCOLAX/MIRALAX) 17 GM/SCOOP powder 1 cap full in a full glass of water, two times a day for 3 days. (Patient taking differently: Take 17 g by mouth daily as needed (constipation). 1 cap full in a full glass of water, two times a day for 3 days.)   psyllium (METAMUCIL SMOOTH TEXTURE) 28 % packet Take 1 packet by mouth daily as needed (constipation).   risedronate (ACTONEL) 35 MG tablet take 1 tablet by mouth weekly with water on an empty stomach **do not eat drink or lie down for the next 51mins (Patient taking differently: Take 35 mg by mouth every Monday. take 1 tablet by mouth weekly with water on an empty stomach **do not eat drink or lie down for the next 29mins)   zolpidem (AMBIEN) 5 MG tablet TAKE ONE TABLET BY MOUTH AT BEDTIME AS NEEDED for sleep *avoid regular use*   No facility-administered encounter medications on file as of 03/09/2021.   Fill  History: DULOXETINE DR 60MG  CAPSULES 02/16/2021 90   EZETIMIBE 10MG  TABLET 01/04/2021 90   GABAPENTIN 300MG  CAPSULE 01/04/2021 90   LOSARTAN 100MG  TABLETS 02/16/2021 90   OMEPRAZOLE 20MG  CAPSULE DELAYED RELEASE 01/29/2021 30   RISEDRONATE SODIUM 35MG  TABLET 10/12/2020 28   AMLODIPINE BESYLATE 5MG  TABLET 01/29/2021 90   Comprehensive medication review performed; Spoke to patient regarding cholesterol  Lipid Panel    Component Value Date/Time   CHOL 230 (H) 09/22/2020 1613   TRIG 134.0 09/22/2020 1613   HDL 62.40  09/22/2020 1613   LDLCALC 141 (H) 09/22/2020 1613   LDLCALC 152 (H) 12/30/2019 1003   LDLDIRECT 149.0 05/15/2019 1551    10-year ASCVD risk score: The ASCVD Risk score (Arnett DK, et al., 2019) failed to calculate for the following reasons:   The 2019 ASCVD risk score is only valid for ages 17 to 57  Current antihyperlipidemic regimen:  Ezetimibe 10mg  - take 1 tablet by mouth one time daily. Previous antihyperlipidemic medications tried:  ASCVD risk enhancing conditions: age >59 and HTN What recent interventions/DTPs have been made by any provider to improve Cholesterol control since last CPP Visit: None. Any recent hospitalizations or ED visits since last visit with CPP? No What diet changes have been made to improve Cholesterol?   What exercise is being done to improve Cholesterol?    Adherence Review: Does the patient have >5 day gap between last estimated fill dates? No  Unsuccessful at reaching patient by phone to complete assessment call.   Care Gaps:  AWV - message sent to Ramond Craver CMA to schedule.  COVID 19 - BOOSTER 5 overdue since 08/06/20.  Star Rating Drugs:  Losartan 100mg  - last filled on 02/15/21 90DS at [facility] Pharmacist Assistant [phone]

## 2021-03-15 NOTE — Telephone Encounter (Cosign Needed)
2nd attempt

## 2021-03-16 ENCOUNTER — Telehealth (INDEPENDENT_AMBULATORY_CARE_PROVIDER_SITE_OTHER): Payer: PPO | Admitting: Family Medicine

## 2021-03-16 ENCOUNTER — Encounter: Payer: Self-pay | Admitting: Family Medicine

## 2021-03-16 ENCOUNTER — Other Ambulatory Visit: Payer: Self-pay

## 2021-03-16 DIAGNOSIS — R059 Cough, unspecified: Secondary | ICD-10-CM

## 2021-03-16 MED ORDER — AMOXICILLIN-POT CLAVULANATE 875-125 MG PO TABS: 1.0000 | ORAL_TABLET | Freq: Two times a day (BID) | ORAL | 0 refills | Status: DC

## 2021-03-16 MED ORDER — BENZONATATE 100 MG PO CAPS: 100.0000 mg | ORAL_CAPSULE | Freq: Three times a day (TID) | ORAL | 0 refills | Status: DC | PRN

## 2021-03-16 NOTE — Telephone Encounter (Cosign Needed)
3rd attempt

## 2021-03-16 NOTE — Progress Notes (Signed)
Virtual Visit via Telephone Note  I connected with Julie Bonilla on 03/16/21 at 10:40 AM EDT by telephone and verified that I am speaking with the correct person using two identifiers.   I discussed the limitations, risks, security and privacy concerns of performing an evaluation and management service by telephone and the availability of in person appointments. I also discussed with the patient that there may be a patient responsible charge related to this service. The patient expressed understanding and agreed to proceed.  Location patient: home, [place] Location provider: work or home office Participants present for the call: patient, provider Patient did not have a visit with me in the prior 7 days to address this/these issue(s).   History of Present Illness:  Acute telemedicine visit for Cough: -Onset: about 1 week -Symptoms include: cough - sometimes coughs up [place] mucus -cough seems to be getting a little better day -Denies:CP, SOB, fevers, body aches, NVD, inability to eat/drink/get out of bed -Has tried:Delsym -Pertinent past medical history: reports has had pneumonia 5-6x in her life and worries about this; also has has had covid in the past -Pertinent medication allergies: Allergies  Allergen Reactions   Crestor [Rosuvastatin] Other (See Comments)    Myalgia on 5 mg per day   Erythromycin Nausea And Vomiting    Oral  No hives rash or itching   Mobic [Meloxicam] Other (See Comments)    Moody and irritable   -COVID-19 vaccine status: has had 2 doses and 2 booster   Observations/Objective: Patient sounds cheerful and well on the phone. I do not appreciate any SOB. Speech and thought processing are grossly intact. Patient reported vitals:  Assessment and Plan:  Cough, unspecified type  -we discussed possible serious and likely etiologies, options for evaluation and workup, limitations of telemedicine visit vs in person visit, treatment, treatment risks and precautions.  Pt prefers to treat via telemedicine empirically rather than in person at this moment. Query VURI, bronchitis, allergies vs other. Possible mild Covid19. She opted for tessalon for cough and agreed to a delayed abx rx since improving after long discussion regarding viral vs bacterial infections and appropriate abx use.   Advised to seek prompt in person care if worsening, new symptoms arise, or if is not improving with treatment. Advised of options for inperson care in case PCP office not available. Did let the patient know that I only do telemedicine shifts for [facility] on Tuesdays and Thursdays and advised a follow up visit with PCP or at an [facility] if has further questions or concerns.   Follow Up Instructions:  I did not refer this patient for an OV with me in the next 24 hours for this/these issue(s).  I discussed the assessment and treatment plan with the patient. The patient was provided an opportunity to ask questions and all were answered. The patient agreed with the plan and demonstrated an understanding of the instructions.   I spent 14 minutes on the date of this visit in the care of this patient. See summary of tasks completed to properly care for this patient in the detailed notes above which also included counseling of above, review of PMH, medications, allergies, evaluation of the patient and ordering and/or  instructing patient on testing and care options.     Julie Kern, DO

## 2021-03-16 NOTE — Patient Instructions (Addendum)
-  I sent the medication(s) we discussed to your pharmacy: Meds ordered this encounter  Medications   amoxicillin-clavulanate (AUGMENTIN) 875-125 MG tablet    Sig: Take 1 tablet by mouth 2 (two) times daily.    Dispense:  14 tablet    Refill:  0   benzonatate (TESSALON PERLES) 100 MG capsule    Sig: Take 1 capsule (100 mg total) by mouth 3 (three) times daily as needed.    Dispense:  20 capsule    Refill:  0     I hope you are feeling better soon!  Seek in person care promptly if your symptoms worsen, new concerns arise or you are not improving with treatment.  It was nice to meet you today. I help [place] out with telemedicine visits on Tuesdays and Thursdays and am available for visits on those days. If you have any concerns or questions following this visit please schedule a follow up visit with your Primary Care doctor or seek care at a local urgent care clinic to avoid delays in care.

## 2021-03-31 ENCOUNTER — Other Ambulatory Visit: Payer: Self-pay | Admitting: Internal Medicine

## 2021-03-31 DIAGNOSIS — Z96652 Presence of left artificial knee joint: Secondary | ICD-10-CM | POA: Diagnosis not present

## 2021-03-31 DIAGNOSIS — M7061 Trochanteric bursitis, right hip: Secondary | ICD-10-CM | POA: Diagnosis not present

## 2021-04-01 DIAGNOSIS — M25562 Pain in left knee: Secondary | ICD-10-CM | POA: Diagnosis not present

## 2021-04-01 DIAGNOSIS — Z96652 Presence of left artificial knee joint: Secondary | ICD-10-CM | POA: Diagnosis not present

## 2021-04-03 ENCOUNTER — Other Ambulatory Visit: Payer: Self-pay | Admitting: Internal Medicine

## 2021-04-04 ENCOUNTER — Other Ambulatory Visit: Payer: Self-pay | Admitting: Internal Medicine

## 2021-04-06 ENCOUNTER — Other Ambulatory Visit: Payer: Self-pay

## 2021-04-19 ENCOUNTER — Other Ambulatory Visit: Payer: Self-pay | Admitting: Internal Medicine

## 2021-04-20 ENCOUNTER — Ambulatory Visit (INDEPENDENT_AMBULATORY_CARE_PROVIDER_SITE_OTHER): Payer: PPO

## 2021-04-20 VITALS — BP 130/70 | Ht 59.0 in | Wt 129.0 lb

## 2021-04-20 DIAGNOSIS — Z0000 Encounter for general adult medical examination without abnormal findings: Secondary | ICD-10-CM

## 2021-04-20 DIAGNOSIS — Z471 Aftercare following joint replacement surgery: Secondary | ICD-10-CM | POA: Diagnosis not present

## 2021-04-20 DIAGNOSIS — Z96652 Presence of left artificial knee joint: Secondary | ICD-10-CM | POA: Diagnosis not present
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
## 2021-04-20 NOTE — Patient Instructions (Addendum)
Julie Bonilla , Thank you for taking time to come for your Medicare Wellness Visit. I appreciate your ongoing commitment to your health goals. Please review the following plan we discussed and let me know if I can assist you in the future.   These are the goals we discussed:  Goals      Patient Stated     Get her diet back in line with your norm.     Weight (lb) < 118 lb (53.5 kg)     Lose 4 to 5 lbs;  Lost 2 to 3 lbs;   Fat free or low fat dairy products Fish high in omega-3 acids ( salmon, tuna, trout) Fruits, such as apples, bananas, oranges, pears, prunes Legumes, such as kidney beans, lentils, checkpeas, black-eyed peas and lima beans Vegetables; broccoli, cabbage, carrots Whole grains;   Plant fats are better; decrease "white" foods as pasta, rice, bread and desserts, sugar; Avoid red meat (limiting) palm and coconut oils; sugary foods and beverages  Two nutrients that raise blood chol levels are saturated fats and trans fat; in hydrogenated oils and fats, as stick margarine, baked goods (cookes, cakes, pies, crackers; frosting; and coffee creamers;   Some Fats lower cholesterol: Monounsaturated and polyunsaturated  Avocados Corn, sunflower, and soybean oils Nuts and seeds, such as walnuts Olive, canola, peanut, safflower, and sesame oils Peanut butter Salmon and trout Tofu.             This is a list of the screening recommended for you and due dates:  Health Maintenance  Topic Date Due   Colon Cancer Screening  04/20/2022*   COVID-19 Vaccine (6 - Booster) 05/22/2021   Tetanus Vaccine  08/07/2028   Pneumonia Vaccine  Completed   Flu Shot  Completed   DEXA scan (bone density measurement)  Completed   Zoster (Shingles) Vaccine  Completed   HPV Vaccine  Aged Out  *Topic was postponed. The date shown is not the original due date.   Recommended yearly ophthalmology/optometry visit for glaucoma screening and checkup Recommended yearly dental visit for hygiene and  checkup   Advanced directives: Yes. Copy on file.  Conditions/risks identified: None   Next appointment: Follow up in one year for your annual wellness visit    Preventive Care 65 Years and Older, Female Preventive care refers to lifestyle choices and visits with your health care provider that can promote health and wellness. What does preventive care include? A yearly physical exam. This is also called an annual well check. Dental exams once or twice a year. Routine eye exams. Ask your health care provider how often you should have your eyes checked. Personal lifestyle choices, including: Daily care of your teeth and gums. Regular physical activity. Eating a healthy diet. Avoiding tobacco and drug use. Limiting alcohol use. Practicing safe sex. Taking low-dose aspirin every day. Taking vitamin and mineral supplements as recommended by your health care provider. What happens during an annual well check? The services and screenings done by your health care provider during your annual well check will depend on your age, overall health, lifestyle risk factors, and family history of disease. Counseling  Your health care provider may ask you questions about your: Alcohol use. Tobacco use. Drug use. Emotional well-being. Home and relationship well-being. Sexual activity. Eating habits. History of falls. Memory and ability to understand (cognition). Work and work Statistician. Reproductive health. Screening  You may have the following tests or measurements: Height, weight, and BMI. Blood pressure. Lipid and cholesterol levels.  These may be checked every 5 years, or more frequently if you are over 59 years old. Skin check. Lung cancer screening. You may have this screening every year starting at age 48 if you have a 30-pack-year history of smoking and currently smoke or have quit within the past 15 years. Fecal occult blood test (FOBT) of the stool. You may have this test every  year starting at age 40. Flexible sigmoidoscopy or colonoscopy. You may have a sigmoidoscopy every 5 years or a colonoscopy every 10 years starting at age 52. Hepatitis C blood test. Hepatitis B blood test. Sexually transmitted disease (STD) testing. Diabetes screening. This is done by checking your blood sugar (glucose) after you have not eaten for a while (fasting). You may have this done every 1-3 years. Bone density scan. This is done to screen for osteoporosis. You may have this done starting at age 76. Mammogram. This may be done every 1-2 years. Talk to your health care provider about how often you should have regular mammograms. Talk with your health care provider about your test results, treatment options, and if necessary, the need for more tests. Vaccines  Your health care provider may recommend certain vaccines, such as: Influenza vaccine. This is recommended every year. Tetanus, diphtheria, and acellular pertussis (Tdap, Td) vaccine. You may need a Td booster every 10 years. Zoster vaccine. You may need this after age 31. Pneumococcal 13-valent conjugate (PCV13) vaccine. One dose is recommended after age 3. Pneumococcal polysaccharide (PPSV23) vaccine. One dose is recommended after age 21. Talk to your health care provider about which screenings and vaccines you need and how often you need them. This information is not intended to replace advice given to you by your health care provider. Make sure you discuss any questions you have with your health care provider. Document Released: 06/19/2015 Document Revised: 02/10/2016 Document Reviewed: 03/24/2015 Elsevier Interactive Patient Education  2017 [employer] Prevention in the Home Falls can cause injuries. They can happen to people of all ages. There are many things you can do to make your home safe and to help prevent falls. What can I do on the outside of my home? Regularly fix the edges of walkways and driveways and  fix any cracks. Remove anything that might make you trip as you walk through a door, such as a raised step or threshold. Trim any bushes or trees on the path to your home. Use bright outdoor lighting. Clear any walking paths of anything that might make someone trip, such as rocks or tools. Regularly check to see if handrails are loose or broken. Make sure that both sides of any steps have handrails. Any raised decks and porches should have guardrails on the edges. Have any leaves, snow, or ice cleared regularly. Use sand or salt on walking paths during winter. Clean up any spills in your garage right away. This includes oil or grease spills. What can I do in the bathroom? Use night lights. Install grab bars by the toilet and in the tub and shower. Do not use towel bars as grab bars. Use non-skid mats or decals in the tub or shower. If you need to sit down in the shower, use a plastic, non-slip stool. Keep the floor dry. Clean up any water that spills on the floor as soon as it happens. Remove soap buildup in the tub or shower regularly. Attach bath mats securely with double-sided non-slip rug tape. Do not have throw rugs and other things on  the floor that can make you trip. What can I do in the bedroom? Use night lights. Make sure that you have a light by your bed that is easy to reach. Do not use any sheets or blankets that are too big for your bed. They should not hang down onto the floor. Have a firm chair that has side arms. You can use this for support while you get dressed. Do not have throw rugs and other things on the floor that can make you trip. What can I do in the kitchen? Clean up any spills right away. Avoid walking on wet floors. Keep items that you use a lot in easy-to-reach places. If you need to reach something above you, use a strong step stool that has a grab bar. Keep electrical cords out of the way. Do not use floor polish or wax that makes floors slippery. If you  must use wax, use non-skid floor wax. Do not have throw rugs and other things on the floor that can make you trip. What can I do with my stairs? Do not leave any items on the stairs. Make sure that there are handrails on both sides of the stairs and use them. Fix handrails that are broken or loose. Make sure that handrails are as long as the stairways. Check any carpeting to make sure that it is firmly attached to the stairs. Fix any carpet that is loose or worn. Avoid having throw rugs at the top or bottom of the stairs. If you do have throw rugs, attach them to the floor with carpet tape. Make sure that you have a light switch at the top of the stairs and the bottom of the stairs. If you do not have them, ask someone to add them for you. What else can I do to help prevent falls? Wear shoes that: Do not have high heels. Have rubber bottoms. Are comfortable and fit you well. Are closed at the toe. Do not wear sandals. If you use a stepladder: Make sure that it is fully opened. Do not climb a closed stepladder. Make sure that both sides of the stepladder are locked into place. Ask someone to hold it for you, if possible. Clearly mark and make sure that you can see: Any grab bars or handrails. First and last steps. Where the edge of each step is. Use tools that help you move around (mobility aids) if they are needed. These include: Canes. Walkers. Scooters. Crutches. Turn on the lights when you go into a dark area. Replace any light bulbs as soon as they burn out. Set up your furniture so you have a clear path. Avoid moving your furniture around. If any of your floors are uneven, fix them. If there are any pets around you, be aware of where they are. Review your medicines with your doctor. Some medicines can make you feel dizzy. This can increase your chance of falling. Ask your doctor what other things that you can do to help prevent falls. This information is not intended to replace  advice given to you by your health care provider. Make sure you discuss any questions you have with your health care provider. Document Released: 03/19/2009 Document Revised: 10/29/2015 Document Reviewed: 06/27/2014 Elsevier Interactive Patient Education  2017 [employer].

## 2021-04-22 ENCOUNTER — Telehealth: Payer: Self-pay | Admitting: Pharmacist

## 2021-04-22 NOTE — Chronic Care Management (AMB) (Signed)
Chronic Care Management Pharmacy Assistant   Name: ALEIGH GRUNDEN  MRN: [id number] DOB: 1940-07-08  04/22/21 APPOINTMENT REMINDER    Patient  was reminded to have all medications, supplements and any blood glucose and blood pressure readings available for review with Jeni Salles, Pharm. D, for telephone visit on 04/23/21 at 11.   Questions: Have you had any recent office visit or specialist visit outside of [place]? Gerrit Halls in Oct ([facility])   Care Gaps: AWV - 11/22 BP - 130/70 (04/20/21)  Star Rating Drug: Losartan (Cozaar) 100 mg - Last filled 02/16/21 90 DS at [facility]     Medications: Outpatient Encounter Medications as of 04/22/2021  Medication Sig   amLODipine (NORVASC) 5 MG tablet TAKE ONE TABLET BY MOUTH ONE TIME DAILY *office visit needed for further refills*   amoxicillin-clavulanate (AUGMENTIN) 875-125 MG tablet Take 1 tablet by mouth 2 (two) times daily. (Patient not taking: Reported on 04/20/2021)   benzonatate (TESSALON PERLES) 100 MG capsule Take 1 capsule (100 mg total) by mouth 3 (three) times daily as needed. (Patient not taking: Reported on 04/20/2021)   Calcium Carb-Cholecalciferol (CALCIUM + D3 PO) Take 1 tablet by mouth in the morning and at bedtime.   cetirizine (ZYRTEC) 10 MG tablet Take 10 mg by mouth daily as needed for allergies.    diphenhydrAMINE HCl, Sleep, 25 MG TBDP Take 25 mg by mouth at bedtime as needed (sleep). (Patient not taking: Reported on 04/20/2021)   DULoxetine (CYMBALTA) 60 MG capsule Take 1 capsule (60 mg total) by mouth 2 (two) times daily.   ezetimibe (ZETIA) 10 MG tablet TAKE ONE TABLET BY MOUTH ONE TIME DAILY   fluticasone (FLONASE) 50 MCG/ACT nasal spray Place 2 sprays into both nostrils daily. (Patient taking differently: Place 2 sprays into both nostrils daily as needed for allergies.)   gabapentin (NEURONTIN) 300 MG capsule take 1 capsule by mouth in the morning and 2 capsules at bedtime or as  directed   losartan (COZAAR) 100 MG tablet Take 1 tablet (100 mg total) by mouth daily.   Magnesium Oxide (MAG-200) 200 MG TABS Take 200 mg by mouth daily in the afternoon.   mirtazapine (REMERON) 7.5 MG tablet TAKE ONE TABLET BY MOUTH DAILY AT NIGHT (MAY INCREASE TO TWO TABLETS AT NIGHT) (Patient not taking: Reported on 04/20/2021)   Multiple Vitamin (MULTIVITAMIN WITH MINERALS) TABS tablet Take 1 tablet by mouth daily. One-A-Day   omeprazole (PRILOSEC) 20 MG capsule Take 20 mg by mouth daily as needed (acid indigestion/heartburn/GERD).   Polyethyl Glycol-Propyl Glycol (LUBRICANT EYE DROPS) 0.4-0.3 % SOLN Place 1 drop into both eyes 3 (three) times daily as needed (dry/irritated eyes.).   polyethylene glycol powder (GLYCOLAX/MIRALAX) 17 GM/SCOOP powder 1 cap full in a full glass of water, two times a day for 3 days. (Patient taking differently: Take 17 g by mouth daily as needed (constipation). 1 cap full in a full glass of water, two times a day for 3 days.)   psyllium (METAMUCIL SMOOTH TEXTURE) 28 % packet Take 1 packet by mouth daily as needed (constipation).   risedronate (ACTONEL) 35 MG tablet TAKE 1 TABLET BY MOUTH ONCE WEEKLY WITH WATER ON EMPTY STOMACH *DON'T EAT/DRINK OR LIE DOWN FOR THE NEXT 30MINS   zolpidem (AMBIEN) 5 MG tablet TAKE ONE TABLET BY MOUTH AT BEDTIME AS NEEDED for sleep *avoid regular use* (Patient not taking: Reported on 04/20/2021)   No facility-administered encounter medications on file as of 04/22/2021.   Samara Deist  [place] Pharmacist Assistant [phone]

## 2021-04-23 ENCOUNTER — Ambulatory Visit (INDEPENDENT_AMBULATORY_CARE_PROVIDER_SITE_OTHER): Payer: PPO | Admitting: Pharmacist

## 2021-04-23 DIAGNOSIS — I10 Essential (primary) hypertension: Secondary | ICD-10-CM

## 2021-04-23 DIAGNOSIS — G4700 Insomnia, unspecified: Secondary | ICD-10-CM

## 2021-04-23 NOTE — Progress Notes (Signed)
Chronic Care Management Pharmacy Note  04/23/2021 Name:  Julie Bonilla MRN:  [id number] DOB:  07/27/1940  Summary: BP is at goal < 140/90 Pt is not sleeping well  Recommendations/Changes made from today's visit: -Recommend trial of trazodone for sleep and scheduled appt with PCP -Recommended continued BP monitoring at home -Recommended use of diclofenac gel to replace the Aleve to avoid consistent NSAID use  Plan: Follow up sleep assessment in 1 month  Subjective: Julie Bonilla is an 80 y.o. year old female who is a primary patient of Panosh, Standley Brooking, MD.  The CCM team was consulted for assistance with disease management and care coordination needs.    Engaged with patient by telephone for follow up visit in response to provider referral for pharmacy case management and/or care coordination services.   Consent to Services:  The patient was given information about Chronic Care Management services, agreed to services, and gave verbal consent prior to initiation of services.  Please see initial visit note for detailed documentation.   Patient Care Team: Panosh, Standley Brooking, MD as PCP - Gaston Islam, MD as Consulting Physician (Orthopedic Surgery) Pedro Earls, MD as Attending Physician (Family Medicine) Izora Gala, MD as Consulting Physician (Otolaryngology) Luberta Mutter, MD as Consulting Physician (Ophthalmology) Viona Gilmore, [facility] as Pharmacist (Pharmacist) Alda Berthold, DO as Consulting Physician (Neurology)  Recent office visits: 04/20/21 Rolene Arbour, LPN: Patient presented for AWV.  03/16/21 Colin Benton, DO: Patient presented for video visit for cough. Prescribed benzonatate and Augmentin.  11/18/20 Grier Mitts, MD: Patient presented for back pain.  09/22/20 Standley Brooking. Panosh MD  - seen for osteoarthritis of both knees, hypertension, pre-op evaluation, peripheral neuropathy, medication management, bronchiectasis and insomnia. Added amlodipine  81m once daily. Follow up in 1-2 months.    Recent consult visits: 09/15/20 Jefry H. Rosen MD (Otolaryngology) - seen for mouth irritation in the mornings. No medication changes. Follow up in two months of not better.  09/07/20 Donika K. PPosey ProntoDO (Neurology) - seen for spinal stenosis. No medication changes. MRI ordered. No follow up noted.  06/10/19 SGerrit Halls(emergeortho): Patient presented for follow up. Unable to access notes.   05/07/20 KLoralie Champagne PT (PT): Patient presented for PT session for right knee pain.   05/05/20 JIzora Gala(otolaryngology): Patient presented for follow up for concerns of thrush.  Hospital visits: 5/9-5/10/22: Patient admitted for knee replacement.  Objective:  Lab Results  Component Value Date   CREATININE 0.76 10/13/2020   BUN 14 10/13/2020   GFR 76.55 09/22/2020   GFRNONAA >60 10/13/2020   GFRAA >60 07/30/2019   NA 138 10/13/2020   K 4.6 10/13/2020   CALCIUM 8.1 (L) 10/13/2020   CO2 24 10/13/2020   GLUCOSE 136 (H) 10/13/2020    Lab Results  Component Value Date/Time   HGBA1C 5.5 09/22/2020 04:13 PM   HGBA1C 5.5 05/15/2019 03:51 PM   GFR 76.55 09/22/2020 04:13 PM   GFR 75.74 05/15/2019 03:51 PM    Last diabetic Eye exam:  Lab Results  Component Value Date/Time   HMDIABEYEEXA Done by Dr. ECheral Marker Cateract surgery 06/07/2007 12:00 AM    Last diabetic Foot exam: No results found for: HMDIABFOOTEX   Lab Results  Component Value Date   CHOL 230 (H) 09/22/2020   HDL 62.40 09/22/2020   LDLCALC 141 (H) 09/22/2020   LDLDIRECT 149.0 05/15/2019   TRIG 134.0 09/22/2020   CHOLHDL 4 09/22/2020    Hepatic Function Latest Ref Rng &  Units 10/08/2020 09/22/2020 12/30/2019  Total Protein 6.5 - 8.1 g/dL 6.6 6.5 6.6  Albumin 3.5 - 5.0 g/dL 4.0 4.1 -  AST 15 - 41 U/L 21 22 19   ALT 0 - 44 U/L 21 21 21   Alk Phosphatase 38 - 126 U/L 53 54 -  Total Bilirubin 0.3 - 1.2 mg/dL 0.7 0.5 0.4  Bilirubin, Direct 0.0 - 0.3 mg/dL - 0.1 0.1    Lab Results   Component Value Date/Time   TSH 1.37 09/22/2020 04:13 PM   TSH 1.95 11/01/2017 09:54 AM    CBC Latest Ref Rng & Units 10/13/2020 10/08/2020 09/22/2020  WBC 4.0 - 10.5 K/uL 12.2(H) 5.4 5.3  Hemoglobin 12.0 - 15.0 g/dL 10.5(L) 13.8 13.7  Hematocrit 36.0 - 46.0 % 33.0(L) 42.5 40.7  Platelets 150 - 400 K/uL 222 248 242.0    Lab Results  Component Value Date/Time   VD25OH 49 03/05/2010 09:52 PM    Clinical ASCVD: No  The ASCVD Risk score (Arnett DK, et al., 2019) failed to calculate for the following reasons:   The 2019 ASCVD risk score is only valid for ages 100 to 28    Depression screen PHQ 2/9 04/20/2021 01/14/2020 11/14/2018  Decreased Interest 0 0 0  Down, Depressed, Hopeless 0 0 0  PHQ - 2 Score 0 0 0  Altered sleeping - 0 -  Tired, decreased energy - 0 -  Change in appetite - 0 -  Trouble concentrating - 0 -  Moving slowly or fidgety/restless - 0 -  Suicidal thoughts - 0 -  PHQ-9 Score - 0 -  Difficult doing work/chores - Not difficult at all -  Some recent data might be hidden      Social History   Tobacco Use  Smoking Status Never  Smokeless Tobacco Never   BP Readings from Last 3 Encounters:  04/20/21 130/70  11/18/20 112/62  10/13/20 (!) 104/57   Pulse Readings from Last 3 Encounters:  11/18/20 85  10/13/20 84  10/08/20 74   Wt Readings from Last 3 Encounters:  04/20/21 129 lb (58.5 kg)  11/18/20 129 lb 3.2 oz (58.6 kg)  10/12/20 127 lb (57.6 kg)   BMI Readings from Last 3 Encounters:  04/20/21 26.05 kg/m  11/18/20 26.10 kg/m  10/12/20 25.65 kg/m    Assessment/Interventions: Review of patient past medical history, allergies, medications, health status, including review of consultants reports, laboratory and other test data, was performed as part of comprehensive evaluation and provision of chronic care management services.   SDOH:  (Social Determinants of Health) assessments and interventions performed: No  SDOH Screenings   Alcohol Screen:  Not on file  Depression (PHQ2-9): Low Risk    PHQ-2 Score: 0  Financial Resource Strain: Low Risk    Difficulty of Paying Living Expenses: Not hard at all  Food Insecurity: No Food Insecurity   Worried About Charity fundraiser in the Last Year: Never true   Ran Out of Food in the Last Year: Never true  Housing: Low Risk    Last Housing Risk Score: 0  Physical Activity: Sufficiently Active   Days of Exercise per Week: 3 days   Minutes of Exercise per Session: 60 min  Social Connections: Moderately Integrated   Frequency of Communication with Friends and Family: More than three times a week   Frequency of Social Gatherings with Friends and Family: More than three times a week   Attends Religious Services: More than 4 times per year  Active Member of Clubs or Organizations: Yes   Attends Archivist Meetings: More than 4 times per year   Marital Status: Widowed  Stress: No Stress Concern Present   Feeling of Stress : Not at all  Tobacco Use: Low Risk    Smoking Tobacco Use: Never   Smokeless Tobacco Use: Never   Passive Exposure: Not on file  Transportation Needs: No Transportation Needs   Lack of Transportation (Medical): No   Lack of Transportation (Non-Medical): No    CCM Care Plan  Allergies  Allergen Reactions   Crestor [Rosuvastatin] Other (See Comments)    Myalgia on 5 mg per day   Erythromycin Nausea And Vomiting    Oral  No hives rash or itching   Mobic [Meloxicam] Other (See Comments)    Moody and irritable     Medications Reviewed Today     Reviewed by Criselda Peaches, LPN (Licensed Practical Nurse) on 04/20/21 at 1433  Med List Status: <None>   Medication Order Taking? Sig Documenting Provider Last Dose Status Informant  amLODipine (NORVASC) 5 MG tablet [id number]  TAKE ONE TABLET BY MOUTH ONE TIME DAILY *office visit needed for further refills* Panosh, Standley Brooking, MD  Active   amoxicillin-clavulanate (AUGMENTIN) 875-125 MG tablet [id number]  Take 1  tablet by mouth 2 (two) times daily. Lucretia Kern, DO  Active   benzonatate (TESSALON PERLES) 100 MG capsule [id number]  Take 1 capsule (100 mg total) by mouth 3 (three) times daily as needed. Lucretia Kern, DO  Active   Calcium Carb-Cholecalciferol (CALCIUM + D3 PO) [id number]  Take 1 tablet by mouth in the morning and at bedtime. [provider]  Active Self  cetirizine (ZYRTEC) 10 MG tablet [id number]  Take 10 mg by mouth daily as needed for allergies.  [provider]  Active Self  diphenhydrAMINE HCl, Sleep, 25 MG TBDP [id number]  Take 25 mg by mouth at bedtime as needed (sleep). [provider]  Active Self  DULoxetine (CYMBALTA) 60 MG capsule [id number]  Take 1 capsule (60 mg total) by mouth 2 (two) times daily. Panosh, Standley Brooking, MD  Active   ezetimibe (ZETIA) 10 MG tablet [id number]  TAKE ONE TABLET BY MOUTH ONE TIME DAILY Panosh, Standley Brooking, MD  Active   fluticasone (FLONASE) 50 MCG/ACT nasal spray [id number]  Place 2 sprays into both nostrils daily.  Patient taking differently: Place 2 sprays into both nostrils daily as needed for allergies.   Collene Gobble, MD  Active   gabapentin (NEURONTIN) 300 MG capsule [id number]  take 1 capsule by mouth in the morning and 2 capsules at bedtime or as directed Panosh, Standley Brooking, MD  Active   losartan (COZAAR) 100 MG tablet [id number]  Take 1 tablet (100 mg total) by mouth daily. Panosh, Standley Brooking, MD  Active   Magnesium Oxide (MAG-200) 200 MG TABS [id number]  Take 200 mg by mouth daily in the afternoon. [provider]  Active Self  mirtazapine (REMERON) 7.5 MG tablet [id number]  TAKE ONE TABLET BY MOUTH DAILY AT NIGHT (MAY INCREASE TO TWO TABLETS AT NIGHT)  Patient taking differently: Take 7.5 mg by mouth in the morning, at noon, and at bedtime.   Panosh, Standley Brooking, MD  Active   Multiple Vitamin (MULTIVITAMIN WITH MINERALS) TABS tablet [id number]  Take 1 tablet by mouth daily. One-A-Day [provider]  Active Self   omeprazole (PRILOSEC) 20 MG capsule [id number]  Take 20 mg by mouth daily as needed (  acid indigestion/heartburn/GERD). [provider]  Active Self  Polyethyl Glycol-Propyl Glycol (LUBRICANT EYE DROPS) 0.4-0.3 % SOLN [id number]  Place 1 drop into both eyes 3 (three) times daily as needed (dry/irritated eyes.). [provider]  Active Self  polyethylene glycol powder (GLYCOLAX/MIRALAX) 17 GM/SCOOP powder [id number]  1 cap full in a full glass of water, two times a day for 3 days.  Patient taking differently: Take 17 g by mouth daily as needed (constipation). 1 cap full in a full glass of water, two times a day for 3 days.   Carrie Mew, MD  Active   psyllium (METAMUCIL SMOOTH TEXTURE) 28 % packet [id number]  Take 1 packet by mouth daily as needed (constipation). [provider]  Active Self  risedronate (ACTONEL) 35 MG tablet [id number]  TAKE 1 TABLET BY MOUTH ONCE WEEKLY WITH WATER ON EMPTY STOMACH *DON'T EAT/DRINK OR LIE DOWN FOR THE NEXT 30MINS Panosh, Standley Brooking, MD  Active   zolpidem (AMBIEN) 5 MG tablet [id number]  TAKE ONE TABLET BY MOUTH AT BEDTIME AS NEEDED for sleep *avoid regular use* Panosh, Standley Brooking, MD  Active             Patient Active Problem List   Diagnosis Date Noted   Primary osteoarthritis of left knee 10/12/2020   OA (osteoarthritis) of knee 07/29/2019   S/P total knee arthroplasty, right 07/29/2019   Osteoporosis without current pathological fracture 06/19/2018   Allergic rhinitis 12/27/2016   Chronic sinusitis 12/23/2016   Memory loss 12/04/2016   Attention deficit 12/04/2016   Hx of bacterial pneumonia 02/23/2015   Hx of adenomatous colonic polyps 11/28/2014   Muscular deconditioning 11/06/2014   Body aches 04/02/2014   Hoarseness 04/02/2014   Hyperlipidemia 04/02/2014   Urinary urgency 04/02/2014   Cough, persistent 04/02/2014   Medication management 01/21/2014   Internal nasal lesion 12/26/2013   Right nasal polyps  ?  12/26/2013    Visit for preventive health examination 07/15/2013   Essential hypertension 07/15/2013   Acute medial meniscal tear 01/29/2013   Knee pain 01/28/2013   GERD (gastroesophageal reflux disease) 04/05/2012   Episode of generalized weakness 01/12/2012   Otalgia of both ears 01/12/2012   Tremor intermittent intention 01/12/2012   Chronic throat clearing 09/25/2011   Hearing loss 03/05/2010   ACQUIRED MUSCULOSKELETAL DEFORMITY OTH SPEC SITE 06/19/2009   SKIN CANCER, HX OF 11/03/2008   BACK PAIN, LUMBAR 06/27/2007   IRON DEFICIENCY 03/27/2007   BACK PAIN, THORACIC REGION 03/27/2007   OSTEOPENIA 12/17/2006   HYPERLIPIDEMIA 10/24/2006   DEPRESSION 10/24/2006   ADHD 10/24/2006   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   Hereditary and idiopathic peripheral neuropathy 10/24/2006   BRONCHIECTASIS 10/24/2006   GERD 10/24/2006   Diverticulosis of colon (without mention of hemorrhage) 10/24/2006   CHILBLAINS 10/24/2006   ACNE ROSACEA, HX OF 10/24/2006    Immunization History  Administered Date(s) Administered   Fluad Quad(high Dose 65+) 03/18/2021   Influenza Split 03/21/2011, 04/05/2012   Influenza Whole 03/27/2007, 03/25/2008, 03/12/2009, 03/05/2010   Influenza, High Dose Seasonal PF 02/23/2015, 04/12/2016, 02/23/2017, 03/22/2018, 05/06/2020   Influenza,inj,Quad PF,6+ Mos 04/18/2013, 04/02/2014   Influenza,inj,quad, With Preservative 04/06/2017, 04/06/2018, 03/07/2019   Influenza-Unspecified 04/15/2020   PFIZER(Purple Top)SARS-COV-2 Vaccination 06/13/2019, 07/04/2019, 04/08/2020, 03/27/2021   Pneumococcal Conjugate-13 07/15/2013   Pneumococcal Polysaccharide-23 03/27/2007   Td 05/06/1996, 03/05/2010   Tdap 08/08/2018   Unspecified SARS-COV-2 Vaccination 07/16/2019   Zoster Recombinat (Shingrix) 10/05/2017, 01/29/2018   Zoster, Live 03/05/2010    Patient fractured her  knee cap in October. This has healed but she is currently taking Aleve for pain. Cautioned against  long term NSAID use and recommended using diclofenac gel more often.  Patient reports her sleep is terrible and hasn't slept a night this week. The mirtazapine didn't help. Patient fell asleep last night and was awake by 3 o clock. Patient hasn't filled the Ambien either in a long time. Patient does want to try clonazepam again.  Conditions to be addressed/monitored:  Hypertension, Hyperlipidemia, GERD, Osteoporosis, Osteoarthritis, Allergic Rhinitis and Insomnia, Nerve Pain, Constipation  Conditions addressed this visit: Insomnia, hypertension  Care Plan : CCM Pharmacy Care Plan  Updates made by Viona Gilmore, [place] since 04/23/2021 12:00 AM     Problem: Problem: Hypertension, Hyperlipidemia, GERD, Osteoporosis, Osteoarthritis, Allergic Rhinitis and Insomnia, Nerve Pain, Constipation      Long-Range Goal: Patient-Specific Goal   Start Date: 10/23/2020  Expected End Date: 10/23/2021  Recent Progress: On track  Priority: High  Note:   Current Barriers:  Unable to independently monitor therapeutic efficacy  Pharmacist Clinical Goal(s):  Patient will achieve adherence to monitoring guidelines and medication adherence to achieve therapeutic efficacy achieve control of cholesterol as evidenced by next lipid panel  through collaboration with PharmD and provider.   Interventions: 1:1 collaboration with Panosh, Standley Brooking, MD regarding development and update of comprehensive plan of care as evidenced by provider attestation and co-signature Inter-disciplinary care team collaboration (see longitudinal plan of care) Comprehensive medication review performed; medication list updated in electronic medical record  Hypertension (BP goal <140/90) -Controlled -Current treatment: Losartan 100 mg daily Amlodipine 5 mg daily -Medications previously tried: none  -Current home readings: 130/80 (was writing them down) - arm cuff; checking occassionally -Current dietary habits: did not  discuss -Current exercise habits: limited due to current pain level -Denies hypotensive/hypertensive symptoms -Educated on Exercise goal of 150 minutes per week; Importance of home blood pressure monitoring; Proper BP monitoring technique; -Counseled to monitor BP at home weekly, document, and provide log at future appointments -Counseled on diet and exercise extensively Recommended to continue current medication  Hyperlipidemia: (LDL goal < 100) -Uncontrolled -Current treatment: Zetia 45m, 1 tablet daily -Medications previously tried: pravastatin, rosuvastatin (statin intolerance)  -Current dietary patterns: doesn't eat fried foods very often; lots of fruits and vegetables, canola or olive oil -Current exercise habits: limited due to current pain -Educated on Cholesterol goals;  Benefits of statin for ASCVD risk reduction; Importance of limiting foods high in cholesterol; Exercise goal of 150 minutes per week; -Counseled on diet and exercise extensively Recommended to continue current medication Recommended for patient to consider lipid clinic referral depending on results of next lipid panel.  Osteopenia (Goal prevent fractures) -Uncontrolled -Last DEXA Scan: 07/06/20   T-Score femoral neck: -1.9  T-Score total hip: n/a  T-Score lumbar spine: n/a  T-Score forearm radius: n/a  10-year probability of major osteoporotic fracture: 15%  10-year probability of hip fracture: 4.1% -Patient is a candidate for pharmacologic treatment due to T-Score -1.0 to -2.5 and 10-year risk of hip fracture > 3% -Current treatment  risedronate 33m 1 tablet weekly (started 11/2018) citracal/ vitamin D3 660/2570m2 tabs daily  (1000 units daily) -Medications previously tried: none  -Recommend [phone] units of vitamin D daily. Recommend 1200 mg of calcium daily from dietary and supplemental sources. Counseled on oral bisphosphonate administration: take in the morning, 30 minutes prior to food with  6-8 oz of water. Do not lie down for at least 30 minutes after taking.  Recommend weight-bearing and muscle strengthening exercises for building and maintaining bone density. -Counseled on diet and exercise extensively Recommended to continue current medication  Insomnia (Goal: improve quality and quantity of sleep) -Uncontrolled -Current treatment  Ambien 5 mg 1 tablet at bedtime - not taking right now -Medications previously tried: Mirtazapine (ineffective), Ambien (tolerance), Belsomra (ineffective), melatonin (ineffective) -Counseled on practicing good sleep hygiene by setting a sleep schedule and maintaining it, avoid excessive napping, following a nightly routine, avoiding screen time for 30-60 minutes before going to bed, and making the bedroom a cool, quiet and dark space  Allergic rhinitis (Goal: minimize symptoms) -Controlled -Current treatment  cetirizine 68m, 1 tablet daily as needed  fluticasone nasal spray (536m/ actuation), 2 sprays into both nostrils daily  Mucinex 40040m1 tablet twice daily  -Medications previously tried: Claritin -Recommended to continue current medication  GERD (Goal: minimize symptoms) -Controlled -Current treatment  omeprazole 40 mg, 1 capsule daily (only when symptomatic) -Medications previously tried: none  -Recommended to continue current medication  Nerve pain (Goal: minimize pain) -Controlled -Current treatment  duloxetine 60 mg, 1 capsule twice daily  gabapentin 300 mg, 1 capsule in AM and 2 capsules at bedtime -Medications previously tried: none  -Recommended to continue current medication  Osteoarthritis (Goal: minimize pain) -Uncontrolled -Current treatment  diclofenac gel 1%, uses BID for arthritis pain  Aleve 220 mg 1 tablets 2-3 nights per week Tylenol 500 mg 3 tablets daily -Medications previously tried: n/a -Recommended to continue current medication Recommended using more diclofenac gel instead of Aleve.  Constipation  (Goal: regular bowel movements) -Controlled -Current treatment  senna-docusate 8.6-50mg, 2 tablets twice daily (only when needed)   Miralax 17 g a couple times a week -Medications previously tried: none  -Recommended to continue current medication Counseled on drinking plenty of fluids and increasing fiber intake.   Health Maintenance -Vaccine gaps: none -Current therapy:  No medications -Educated on Cost vs benefit of each product must be carefully weighed by individual consumer -Patient is satisfied with current therapy and denies issues -Recommended to continue as is  Patient Goals/Self-Care Activities Patient will:  - take medications as prescribed check blood pressure weekly, document, and provide at future appointments target a minimum of 150 minutes of moderate intensity exercise weekly  Follow Up Plan: Telephone follow up appointment with care management team member scheduled for:6 months       Medication Assistance: None required.  Patient affirms current coverage meets needs.   Compliance/Adherence/Medication fill history: Care Gaps: BP - 130/70 (04/20/21)  Star-Rating Drugs: Losartan (Cozaar) 100 mg - [id number] [id number]: [phone]: [phone] pill box? Yes Pt endorses 100% compliance  We discussed: Current pharmacy is preferred with insurance plan and patient is satisfied with pharmacy services Patient decided to: Continue current medication management strategy  Care Plan and Follow Up Patient Decision:  Patient agrees to Care Plan and Follow-up.  Plan: Telephone follow up appointment with care management team member scheduled for:  6 months  MadJeni SallesharmD BCARuskarmacist [place] [phone]

## 2021-04-23 NOTE — Patient Instructions (Signed)
Hi Julie Bonilla,  It was great to get to catch up with you again!  Don't forget to keep on checking your blood pressure at home. Also, try your best to avoid consistent use of Aleve as this can put a strain on your kidneys and raise your blood pressure. Try to use more of the diclofenac gel when possible.  Please reach out to me if you have any questions or need anything before our follow up!  Best, Maddie  Jeni Salles, PharmD, [facility]   Visit Information   Goals Addressed   None    Patient Care Plan: CCM Pharmacy Care Plan     Problem Identified: Problem: Hypertension, Hyperlipidemia, GERD, Osteoporosis, Osteoarthritis, Allergic Rhinitis and Insomnia, Nerve Pain, Constipation      Long-Range Goal: Patient-Specific Goal   Start Date: 10/23/2020  Expected End Date: 10/23/2021  Recent Progress: On track  Priority: High  Note:   Current Barriers:  Unable to independently monitor therapeutic efficacy  Pharmacist Clinical Goal(s):  Patient will achieve adherence to monitoring guidelines and medication adherence to achieve therapeutic efficacy achieve control of cholesterol as evidenced by next lipid panel  through collaboration with PharmD and provider.   Interventions: 1:1 collaboration with Panosh, Standley Brooking, MD regarding development and update of comprehensive plan of care as evidenced by provider attestation and co-signature Inter-disciplinary care team collaboration (see longitudinal plan of care) Comprehensive medication review performed; medication list updated in electronic medical record  Hypertension (BP goal <140/90) -Controlled -Current treatment: Losartan 100 mg daily Amlodipine 5 mg daily -Medications previously tried: none  -Current home readings: 130/80 (was writing them down) - arm cuff; checking occassionally -Current dietary habits: did not discuss -Current exercise habits: limited due to  current pain level -Denies hypotensive/hypertensive symptoms -Educated on Exercise goal of 150 minutes per week; Importance of home blood pressure monitoring; Proper BP monitoring technique; -Counseled to monitor BP at home weekly, document, and provide log at future appointments -Counseled on diet and exercise extensively Recommended to continue current medication  Hyperlipidemia: (LDL goal < 100) -Uncontrolled -Current treatment: Zetia 10mg , 1 tablet daily -Medications previously tried: pravastatin, rosuvastatin (statin intolerance)  -Current dietary patterns: doesn't eat fried foods very often; lots of fruits and vegetables, canola or olive oil -Current exercise habits: limited due to current pain -Educated on Cholesterol goals;  Benefits of statin for ASCVD risk reduction; Importance of limiting foods high in cholesterol; Exercise goal of 150 minutes per week; -Counseled on diet and exercise extensively Recommended to continue current medication Recommended for patient to consider lipid clinic referral depending on results of next lipid panel.  Osteopenia (Goal prevent fractures) -Uncontrolled -Last DEXA Scan: 07/06/20   T-Score femoral neck: -1.9  T-Score total hip: n/a  T-Score lumbar spine: n/a  T-Score forearm radius: n/a  10-year probability of major osteoporotic fracture: 15%  10-year probability of hip fracture: 4.1% -Patient is a candidate for pharmacologic treatment due to T-Score -1.0 to -2.5 and 10-year risk of hip fracture > 3% -Current treatment  risedronate 35mg , 1 tablet weekly (started 11/2018) citracal/ vitamin D3 660/25mg , 2 tabs daily  (1000 units daily) -Medications previously tried: none  -Recommend [phone] units of vitamin D daily. Recommend 1200 mg of calcium daily from dietary and supplemental sources. Counseled on oral bisphosphonate administration: take in the morning, 30 minutes prior to food with 6-8 oz of water. Do not lie down for at least 30  minutes after taking. Recommend weight-bearing and muscle strengthening exercises  for building and maintaining bone density. -Counseled on diet and exercise extensively Recommended to continue current medication  Insomnia (Goal: improve quality and quantity of sleep) -Uncontrolled -Current treatment  Ambien 5 mg 1 tablet at bedtime - not taking right now -Medications previously tried: Mirtazapine (ineffective), Ambien (tolerance), Belsomra (ineffective), melatonin (ineffective) -Counseled on practicing good sleep hygiene by setting a sleep schedule and maintaining it, avoid excessive napping, following a nightly routine, avoiding screen time for 30-60 minutes before going to bed, and making the bedroom a cool, quiet and dark space  Allergic rhinitis (Goal: minimize symptoms) -Controlled -Current treatment  cetirizine 10mg , 1 tablet daily as needed  fluticasone nasal spray (58mcg/ actuation), 2 sprays into both nostrils daily  Mucinex 400mg , 1 tablet twice daily  -Medications previously tried: Claritin -Recommended to continue current medication  GERD (Goal: minimize symptoms) -Controlled -Current treatment  omeprazole 40 mg, 1 capsule daily (only when symptomatic) -Medications previously tried: none  -Recommended to continue current medication  Nerve pain (Goal: minimize pain) -Controlled -Current treatment  duloxetine 60 mg, 1 capsule twice daily  gabapentin 300 mg, 1 capsule in AM and 2 capsules at bedtime -Medications previously tried: none  -Recommended to continue current medication  Osteoarthritis (Goal: minimize pain) -Uncontrolled -Current treatment  diclofenac gel 1%, uses BID for arthritis pain  Aleve 220 mg 1 tablets 2-3 nights per week Tylenol 500 mg 3 tablets daily -Medications previously tried: n/a -Recommended to continue current medication Recommended using more diclofenac gel instead of Aleve.  Constipation (Goal: regular bowel  movements) -Controlled -Current treatment  senna-docusate 8.6-50mg , 2 tablets twice daily (only when needed)   Miralax 17 g a couple times a week -Medications previously tried: none  -Recommended to continue current medication Counseled on drinking plenty of fluids and increasing fiber intake.   Health Maintenance -Vaccine gaps: none -Current therapy:  No medications -Educated on Cost vs benefit of each product must be carefully weighed by individual consumer -Patient is satisfied with current therapy and denies issues -Recommended to continue as is  Patient Goals/Self-Care Activities Patient will:  - take medications as prescribed check blood pressure weekly, document, and provide at future appointments target a minimum of 150 minutes of moderate intensity exercise weekly  Follow Up Plan: Telephone follow up appointment with care management team member scheduled for:6 months       Patient verbalizes understanding of instructions provided today and agrees to view in [place].  Telephone follow up appointment with pharmacy team member scheduled for: 6 months  Viona Gilmore, [facility]

## 2021-04-28 ENCOUNTER — Other Ambulatory Visit: Payer: Self-pay

## 2021-04-28 ENCOUNTER — Encounter: Payer: Self-pay | Admitting: Internal Medicine

## 2021-04-28 ENCOUNTER — Telehealth (INDEPENDENT_AMBULATORY_CARE_PROVIDER_SITE_OTHER): Payer: PPO | Admitting: Internal Medicine

## 2021-04-28 VITALS — Ht 59.0 in | Wt 129.0 lb

## 2021-04-28 DIAGNOSIS — Z79899 Other long term (current) drug therapy: Secondary | ICD-10-CM | POA: Diagnosis not present

## 2021-04-28 DIAGNOSIS — G4700 Insomnia, unspecified: Secondary | ICD-10-CM

## 2021-04-28 MED ORDER — TRAZODONE HCL 50 MG PO TABS: 25.0000 mg | ORAL_TABLET | Freq: Every day | ORAL | 1 refills | Status: DC

## 2021-04-28 NOTE — Progress Notes (Signed)
   Virtual Visit via Telephone Note  I connected with@ on 04/28/21 at  4:00 PM EST by telephone and verified that I am speaking with the correct person using two identifiers.   I discussed the limitations, risks, security and privacy concerns of performing an evaluation and management service by telephone and the limited availability of in person appointments. tThere may be a patient responsible charge related to this service. The patient expressed understanding and agreed to proceed.  Location patient: In her vehicle traveling Location provider: work office Participants present for the call: patient, provider Patient did not have a visit in the prior 7 days to address this/these issue(s).   History of Present Illness: Julie Bonilla  presents  arrange by   pharmacy team to address the on  sleep issue  She has difficulties falling asleep and then when she awakens falling back asleep states that her mind is very busy and racing. This is been going on for quite a while.  She does attention to sleep hygiene does shut the TV off at 10:00 and reads if needed.  She is most recently been on mirtazapine does not seem to help suggestion of pharmacy team to try something else. Reconstructing was on clonazepam many years ago for restless leg and sleep but then it stopped working. Tried Ambien 5 mg and it seemed to work quite well but was only taking it as instructed which was a few times a week and was concerned about it affecting her memory potential and taking it every night.  In the past was on gabapentin which helped somewhat but really made her memory foggy and she had to stop which improved her memory. She has taken over-the-counter and melatonin.  No new changes in her health.  Had knee surgery in May. Observations/Objective: Patient sounds personable and well on the phone. I do not appreciate any SOB. Speech and thought processing are grossly intact. Patient reported vitals:  Assessment  and Plan: Insomnia, unspecified type  Medication management  Reviewed what is available either medications do not work lose effectiveness or have side effects. We could consider going back to Ambien even lower dose but the caution is concerned about memory.  It sounds like she did not have the side effect but has concern. Will continue sleep hygiene Stop mirtazapine trial of trazodone 25-50 up to 75 mg at night let us know how things are going after 2 to 4 weeks Can follow-up at her next physical also after the new year.  When due. She can also keep in touch with the pharmacy team. Follow Up Instructions:     [id number] 5-10 [id number] 11-20 [id number] 21-30 I did not refer this patient for an OV in the next 24 hours for this/these issue(s).  I discussed the assessment and treatment plan with the patient. The patient was provided an opportunity to ask questions and answered. The patient agreed with the plan and demonstrated an understanding of the instructions.   The patient was advised to call back or seek an in-person evaluation if the symptoms worsen or if the condition fails to improve as anticipated.  I provided 15 minutes of non-face-to-face time during this encounter. Return for 3-4 weeks update and  visit after the new year  when due.  Shanon Ace, MD

## 2021-05-05 ENCOUNTER — Other Ambulatory Visit: Payer: Self-pay | Admitting: Internal Medicine

## 2021-05-05 DIAGNOSIS — I10 Essential (primary) hypertension: Secondary | ICD-10-CM

## 2021-05-06 DIAGNOSIS — Z85828 Personal history of other malignant neoplasm of skin: Secondary | ICD-10-CM | POA: Diagnosis not present

## 2021-05-06 DIAGNOSIS — L570 Actinic keratosis: Secondary | ICD-10-CM | POA: Diagnosis not present

## 2021-05-06 DIAGNOSIS — D485 Neoplasm of uncertain behavior of skin: Secondary | ICD-10-CM | POA: Diagnosis not present

## 2021-05-06 DIAGNOSIS — L308 Other specified dermatitis: Secondary | ICD-10-CM | POA: Diagnosis not present

## 2021-05-11 ENCOUNTER — Telehealth: Payer: Self-pay | Admitting: Pharmacist

## 2021-05-11 NOTE — Chronic Care Management (AMB) (Signed)
Chronic Care Management Pharmacy Assistant   Name: Julie Bonilla  MRN: [id number] DOB: 1940/11/13  Reason for Encounter: Disease State / Hypertension and Sleep Assessment    Conditions to be addressed/monitored: HTN  Recent office visits:  04/28/21 Julie Medin, MD - Patient presented for Insomnia and other concerns. Prescribed Trazodone 25 mg.  Recent consult visits:  None  Hospital visits:  None in previous 6 months  Medications: Outpatient Encounter Medications as of 05/11/2021  Medication Sig   amLODipine (NORVASC) 5 MG tablet TAKE ONE TABLET BY MOUTH ONE TIME DAILY *office visit needed for further refills*   Calcium Carb-Cholecalciferol (CALCIUM + D3 PO) Take 1 tablet by mouth in the morning and at bedtime.   cetirizine (ZYRTEC) 10 MG tablet Take 10 mg by mouth daily as needed for allergies.    diphenhydrAMINE HCl, Sleep, 25 MG TBDP Take 25 mg by mouth at bedtime as needed (sleep).   DULoxetine (CYMBALTA) 60 MG capsule Take 1 capsule (60 mg total) by mouth 2 (two) times daily.   ezetimibe (ZETIA) 10 MG tablet TAKE ONE TABLET BY MOUTH ONE TIME DAILY   fluticasone (FLONASE) 50 MCG/ACT nasal spray Place 2 sprays into both nostrils daily. (Patient taking differently: Place 2 sprays into both nostrils daily as needed for allergies.)   gabapentin (NEURONTIN) 300 MG capsule take 1 capsule by mouth in the morning and 2 capsules at bedtime or as directed   losartan (COZAAR) 100 MG tablet Take 1 tablet (100 mg total) by mouth daily.   Magnesium Oxide (MAG-200) 200 MG TABS Take 200 mg by mouth daily in the afternoon.   Multiple Vitamin (MULTIVITAMIN WITH MINERALS) TABS tablet Take 1 tablet by mouth daily. One-A-Day   omeprazole (PRILOSEC) 20 MG capsule Take 20 mg by mouth daily as needed (acid indigestion/heartburn/GERD).   Polyethyl Glycol-Propyl Glycol (LUBRICANT EYE DROPS) 0.4-0.3 % SOLN Place 1 drop into both eyes 3 (three) times daily as needed (dry/irritated eyes.).    polyethylene glycol powder (GLYCOLAX/MIRALAX) 17 GM/SCOOP powder 1 cap full in a full glass of water, two times a day for 3 days. (Patient taking differently: Take 17 g by mouth daily as needed (constipation). 1 cap full in a full glass of water, two times a day for 3 days.)   psyllium (METAMUCIL SMOOTH TEXTURE) 28 % packet Take 1 packet by mouth daily as needed (constipation).   risedronate (ACTONEL) 35 MG tablet TAKE ONE TABLET BY MOUTH ONCE WEEKLY WITH WATER ON EMPTY STOMACH (DON'T EAT/DRINK OR LIE DOWN FOR THE NEXT 30MINS)   traZODone (DESYREL) 50 MG tablet Take 0.5 tablets (25 mg total) by mouth at bedtime. Increase to 50 - 75 mg if needed for sleep, (stop the mirtazapine)   zolpidem (AMBIEN) 5 MG tablet TAKE ONE TABLET BY MOUTH AT BEDTIME AS NEEDED for sleep *avoid regular use*   No facility-administered encounter medications on file as of 05/11/2021.   Reviewed chart prior to disease state call. Spoke with patient regarding BP  Recent Office Vitals: BP Readings from Last 3 Encounters:  04/20/21 130/70  11/18/20 112/62  10/13/20 (!) 104/57   Pulse Readings from Last 3 Encounters:  11/18/20 85  10/13/20 84  10/08/20 74    Wt Readings from Last 3 Encounters:  04/28/21 129 lb (58.5 kg)  04/20/21 129 lb (58.5 kg)  11/18/20 129 lb 3.2 oz (58.6 kg)     Kidney Function Lab Results  Component Value Date/Time   CREATININE 0.76 10/13/2020 03:41 AM  CREATININE 0.68 10/08/2020 11:21 AM   CREATININE 0.84 12/30/2019 10:03 AM   GFR 76.55 09/22/2020 04:13 PM   GFRNONAA >60 10/13/2020 03:41 AM   GFRAA >60 07/30/2019 03:56 AM    BMP Latest Ref Rng & Units 10/13/2020 10/08/2020 09/22/2020  Glucose 70 - 99 mg/dL 136(H) 107(H) 89  BUN 8 - 23 mg/dL 14 20 16   Creatinine 0.44 - 1.00 mg/dL 0.76 0.68 0.74  BUN/Creat Ratio 6 - 22 (calc) - - -  Sodium 135 - 145 mmol/L 138 139 140  Potassium 3.5 - 5.1 mmol/L 4.6 3.9 4.7  Chloride 98 - 111 mmol/L 108 106 104  CO2 22 - 32 mmol/L 24 25 27   Calcium  8.9 - 10.3 mg/dL 8.1(L) 8.8(L) 9.4    Current antihypertensive regimen:  Losartan 100 mg daily Amlodipine 5 mg daily How often are you checking your Blood Pressure? weekly Current home BP readings: Patient reports average reading of 130/70 she reports it does not reach much higher than that, she denies any headache, dizziness, lightheadedness. What recent interventions/DTPs have been made by any provider to improve Blood Pressure control since last CPP Visit: Patient reports no changes Any recent hospitalizations or ED visits since last visit with CPP? No What diet changes have been made to improve Blood Pressure Control?  Patient reports that she has been eating well What exercise is being done to improve your Blood Pressure Control?  Patient reports she has back pain and knee issues that she follows her orthopedist for.  Adherence Review: Is the patient currently on ACE/ARB medication? Yes Does the patient have >5 day gap between last estimated fill dates? No   Notes: Patient reports after starting trazodone she is still taking the medication started off with one and is not to 1 and one half. She reports it has not yet helped her get to sleep. She states it has been helping her back pain that she notices is less when she has taken the medication but she does not get to sleep until after 2:30 in the morning she has even tried reading as that sometimes helps her get to sleep but it has not been working. Patient reports she will be going out of town and can be reached on her cell [phone]   Care Gaps: AWV - 11/23 BP - 130/70 (04/20/21) CCM - 5/23  Star Rating Drug: Losartan (Cozaar) 100 mg - Last filled 02/16/21 90 DS at [facility]   Patient Assistance: None  Makena Clinical Pharmacist Assistant [phone]

## 2021-05-19 DIAGNOSIS — Z96652 Presence of left artificial knee joint: Secondary | ICD-10-CM | POA: Diagnosis not present

## 2021-05-19 DIAGNOSIS — Z471 Aftercare following joint replacement surgery: Secondary | ICD-10-CM | POA: Diagnosis not present

## 2021-05-20 ENCOUNTER — Other Ambulatory Visit: Payer: Self-pay | Admitting: Internal Medicine

## 2021-05-20 NOTE — Progress Notes (Signed)
Sent message, via epic in basket, requesting orders in epic from surgeon.  

## 2021-05-26 ENCOUNTER — Other Ambulatory Visit: Payer: Self-pay | Admitting: Internal Medicine

## 2021-05-28 NOTE — Patient Instructions (Addendum)
DUE TO COVID-19 ONLY ONE VISITOR IS ALLOWED TO COME WITH YOU AND STAY IN THE WAITING ROOM ONLY DURING PRE OP AND PROCEDURE DAY OF SURGERY IF YOU ARE GOING HOME AFTER SURGERY. IF YOU ARE SPENDING THE NIGHT 2 PEOPLE MAY VISIT WITH YOU IN YOUR PRIVATE ROOM AFTER SURGERY UNTIL VISITING  HOURS ARE OVER AT 800 PM AND 1  VISITOR  MAY  SPEND THE NIGHT.   Marland Kitchen               Julie Bonilla     Your procedure is scheduled on: 06/09/21   Report to [facility] Main  Entrance   Report to admitting at 2:15 PM     Call this number if you have problems the morning of surgery [phone]    Remember: Do not eat food  :After Midnight the night before your surgery,   You may have clear liquids from midnight until 1:30 PM    CLEAR LIQUID DIET   Foods Allowed                                                                     Foods Excluded  Coffee and tea, regular and decaf                             liquids that you cannot  Plain Jell-O any favor except red or purple                                           see through such as: Fruit ices (not with fruit pulp)                                     milk, soups, orange juice  Iced Popsicles                                    All solid food Carbonated beverages, regular and diet                                    Cranberry, grape and apple juices Sports drinks like Gatorade Lightly seasoned clear broth or consume(fat free) Sugar     BRUSH YOUR TEETH MORNING OF SURGERY AND RINSE YOUR MOUTH OUT, NO CHEWING GUM CANDY OR MINTS.     Take these medicines the morning of surgery with A SIP OF WATER: Gabapentin, Duloxetine, Amlodipine, Omeprazole                                You may not have any metal on your body including hair pins and              piercings  Do not wear jewelry, make-up, lotions, powders or perfumes, deodorant  Do not wear nail polish on your fingernails.  Do not shave  48 hours prior to surgery.            .   Do not bring valuables to the hospital. [place].  Contacts, dentures or bridgework may not be worn into surgery.  Leave suitcase in the car. After surgery it may be brought to your room.     Patients discharged the day of surgery will not be allowed to drive home.  IF YOU ARE HAVING SURGERY AND GOING HOME THE SAME DAY, YOU MUST HAVE AN ADULT TO DRIVE YOU HOME AND BE WITH YOU FOR 24 HOURS. YOU MAY GO HOME BY TAXI OR UBER OR ORTHERWISE, BUT AN ADULT MUST ACCOMPANY YOU HOME AND STAY WITH YOU FOR 24 HOURS.  Name and phone number of your driver:  Special Instructions: N/A              Please read over the following fact sheets you were given: _____________________________________________________________________             [facility] - Preparing for Surgery Before surgery, you can play an important role.  Because skin is not sterile, your skin needs to be as free of germs as possible.  You can reduce the number of germs on your skin by washing with CHG (chlorahexidine gluconate) soap before surgery.  CHG is an antiseptic cleaner which kills germs and bonds with the skin to continue killing germs even after washing. Please DO NOT use if you have an allergy to CHG or antibacterial soaps.  If your skin becomes reddened/irritated stop using the CHG and inform your nurse when you arrive at Short Stay. Do not shave (including legs and underarms) for at least 48 hours prior to the first CHG shower.   Please follow these instructions carefully:  1.  Shower with CHG Soap the night before surgery and the  morning of Surgery.  2.  If you choose to wash your hair, wash your hair first as usual with your  normal  shampoo.  3.  After you shampoo, rinse your hair and body thoroughly to remove the  shampoo.                            4.  Use CHG as you would any other liquid soap.  You can apply chg directly  to the skin and wash                        Gently with a scrungie or clean washcloth.  5.  Apply the CHG Soap to your body ONLY FROM THE NECK DOWN.   Do not use on face/ open                           Wound or open sores. Avoid contact with eyes, ears mouth and genitals (private parts).                       Wash face,  Genitals (private parts) with your normal soap.             6.  Wash thoroughly, paying special attention to the area where your surgery  will be performed.  7.  Thoroughly rinse your  body with warm water from the neck down.  8.  DO NOT shower/wash with your normal soap after using and rinsing off  the CHG Soap.                9.  Pat yourself dry with a clean towel.            10.  Wear clean pajamas.            11.  Place clean sheets on your bed the night of your first shower and do not  sleep with pets. Day of Surgery : Do not apply any lotions/deodorants the morning of surgery.  Please wear clean clothes to the hospital/surgery center.  FAILURE TO FOLLOW THESE INSTRUCTIONS MAY RESULT IN THE CANCELLATION OF YOUR SURGERY PATIENT SIGNATURE_________________________________  NURSE SIGNATURE__________________________________  ________________________________________________________________________   Julie Bonilla  An incentive spirometer is a tool that can help keep your lungs clear and active. This tool measures how well you are filling your lungs with each breath. Taking long deep breaths may help reverse or decrease the chance of developing breathing (pulmonary) problems (especially infection) following: A long period of time when you are unable to move or be active. BEFORE THE PROCEDURE  If the spirometer includes an indicator to show your best effort, your nurse or respiratory therapist will set it to a desired goal. If possible, sit up straight or lean slightly forward. Try not to slouch. Hold the incentive spirometer in an upright position. INSTRUCTIONS FOR USE  Sit on the edge of your bed if  possible, or sit up as far as you can in bed or on a chair. Hold the incentive spirometer in an upright position. Breathe out normally. Place the mouthpiece in your mouth and seal your lips tightly around it. Breathe in slowly and as deeply as possible, raising the piston or the ball toward the top of the column. Hold your breath for 3-5 seconds or for as long as possible. Allow the piston or ball to fall to the bottom of the column. Remove the mouthpiece from your mouth and breathe out normally. Rest for a few seconds and repeat Steps 1 through 7 at least 10 times every 1-2 hours when you are awake. Take your time and take a few normal breaths between deep breaths. The spirometer may include an indicator to show your best effort. Use the indicator as a goal to work toward during each repetition. After each set of 10 deep breaths, practice coughing to be sure your lungs are clear. If you have an incision (the cut made at the time of surgery), support your incision when coughing by placing a pillow or rolled up towels firmly against it. Once you are able to get out of bed, walk around indoors and cough well. You may stop using the incentive spirometer when instructed by your caregiver.  RISKS AND COMPLICATIONS Take your time so you do not get dizzy or light-headed. If you are in pain, you may need to take or ask for pain medication before doing incentive spirometry. It is harder to take a deep breath if you are having pain. AFTER USE Rest and breathe slowly and easily. It can be helpful to keep track of a log of your progress. Your caregiver can provide you with a simple table to help with this. If you are using the spirometer at home, follow these instructions: [place] IF:  You are having difficultly using the spirometer. You have trouble using  the spirometer as often as instructed. Your pain medication is not giving enough relief while using the spirometer. You develop fever of  100.5 F (38.1 C) or higher. SEEK IMMEDIATE MEDICAL CARE IF:  You cough up bloody sputum that had not been present before. You develop fever of 102 F (38.9 C) or greater. You develop worsening pain at or near the incision site. MAKE SURE YOU:  Understand these instructions. Will watch your condition. Will get help right away if you are not doing well or get worse. Document Released: 10/03/2006 Document Revised: 08/15/2011 Document Reviewed: 12/04/2006 [facility] Patient Information 2014 [employer], [place].   ________________________________________________________________________ PCR

## 2021-06-01 ENCOUNTER — Encounter (HOSPITAL_COMMUNITY): Payer: Self-pay

## 2021-06-01 ENCOUNTER — Encounter (HOSPITAL_COMMUNITY)
Admission: RE | Admit: 2021-06-01 | Discharge: 2021-06-01 | Disposition: A | Discharge status: Home / Self Care | Payer: PPO | Source: Ambulatory Visit | Attending: Orthopedic Surgery | Admitting: Orthopedic Surgery

## 2021-06-01 ENCOUNTER — Other Ambulatory Visit: Payer: Self-pay

## 2021-06-01 VITALS — BP 147/87 | HR 84 | Temp 98.0°F | Resp 18 | Ht 59.0 in | Wt 131.4 lb

## 2021-06-01 DIAGNOSIS — Z01812 Encounter for preprocedural laboratory examination: Secondary | ICD-10-CM | POA: Insufficient documentation

## 2021-06-01 DIAGNOSIS — I10 Essential (primary) hypertension: Secondary | ICD-10-CM | POA: Diagnosis not present

## 2021-06-01 DIAGNOSIS — Z01818 Encounter for other preprocedural examination: Secondary | ICD-10-CM

## 2021-06-01 LAB — CBC
HCT: 40.6 % (ref 36.0–46.0)
Hemoglobin: 13.3 g/dL (ref 12.0–15.0)
MCH: 30.7 pg (ref 26.0–34.0)
MCHC: 32.8 g/dL (ref 30.0–36.0)
MCV: 93.8 fL (ref 80.0–100.0)
Platelets: 276 10*3/uL (ref 150–400)
RBC: 4.33 MIL/uL (ref 3.87–5.11)
RDW: 13.7 % (ref 11.5–15.5)
WBC: 6.1 10*3/uL (ref 4.0–10.5)
nRBC: 0 % (ref 0.0–0.2)

## 2021-06-01 LAB — BASIC METABOLIC PANEL
Anion gap: 7 (ref 5–15)
BUN: 18 mg/dL (ref 8–23)
CO2: 25 mmol/L (ref 22–32)
Calcium: 8.8 mg/dL — ABNORMAL LOW (ref 8.9–10.3)
Chloride: 105 mmol/L (ref 98–111)
Creatinine, Ser: 0.72 mg/dL (ref 0.44–1.00)
GFR, Estimated: >60 mL/min (ref >60)
Glucose, Bld: 115 mg/dL — ABNORMAL HIGH (ref 70–99)
Potassium: 4.3 mmol/L (ref 3.5–5.1)
Sodium: 137 mmol/L (ref 135–145)

## 2021-06-01 LAB — SURGICAL PCR SCREEN
MRSA, PCR: NEGATIVE
Staphylococcus aureus: NEGATIVE

## 2021-06-01 NOTE — Progress Notes (Signed)
COVID test- NA  PCP - Dr. Idell Pickles Cardiologist - none  Chest x-ray - no EKG - 09/22/20-epic Stress Test - no ECHO - no Cardiac Cath - NA Pacemaker/ICD device last checked:NA  Sleep Study - no CPAP -   Fasting Blood Sugar - NA Checks Blood Sugar _____ times a day  Blood Thinner Instructions:NA Aspirin Instructions: Last Dose:  Anesthesia review: no  Patient denies shortness of breath, fever, cough and chest pain at PAT appointment Pt has no SOB with activities. She lives with her twin sister.  Patient verbalized understanding of instructions that were given to them at the PAT appointment. Patient was also instructed that they will need to review over the PAT instructions again at home before surgery. yes

## 2021-06-07 ENCOUNTER — Other Ambulatory Visit: Payer: Self-pay | Admitting: Internal Medicine

## 2021-06-08 NOTE — Anesthesia Preprocedure Evaluation (Addendum)
Anesthesia Evaluation  Patient identified by MRN, date of birth, ID band Patient awake    Reviewed: Allergy & Precautions, NPO status , Patient's Chart, lab work & pertinent test results  Airway Mallampati: II  TM Distance: >3 FB Neck ROM: Full    Dental no notable dental hx. (+) Teeth Intact, Dental Advisory Given   Pulmonary neg pulmonary ROS,    Pulmonary exam normal breath sounds clear to auscultation       Cardiovascular hypertension, Pt. on medications Normal cardiovascular exam Rhythm:Regular Rate:Normal     Neuro/Psych Anxiety Depression  Neuromuscular disease    GI/Hepatic Neg liver ROS, GERD  Medicated and Controlled,  Endo/Other  negative endocrine ROS  Renal/GU Lab Results      Component                Value               Date                      CREATININE               0.72                06/01/2021                BUN                      18                  06/01/2021                NA                       137                 06/01/2021                K                        4.3                 06/01/2021                CL                       105                 06/01/2021                CO2                      25                  06/01/2021                Musculoskeletal  (+) Arthritis ,   Abdominal   Peds  Hematology Lab Results      Component                Value               Date                      WBC  6.1                 06/01/2021                HGB                      13.3                06/01/2021                HCT                      40.6                06/01/2021                MCV                      93.8                06/01/2021                PLT                      276                 06/01/2021              Anesthesia Other Findings ALL: Erythromycin, crestor, mobic  Reproductive/Obstetrics                             Anesthesia Physical Anesthesia Plan  ASA: 2  Anesthesia Plan: Regional and Spinal   Post-op Pain Management: Regional block and Minimal or no pain anticipated   Induction: Intravenous  PONV Risk Score and Plan: 3 and Treatment may vary due to age or medical condition and Ondansetron  Airway Management Planned: Natural Airway and Simple Face Mask  Additional Equipment: None  Intra-op Plan:   Post-operative Plan:   Informed Consent: I have reviewed the patients History and Physical, chart, labs and discussed the procedure including the risks, benefits and alternatives for the proposed anesthesia with the patient or authorized representative who has indicated his/her understanding and acceptance.     Dental advisory given  Plan Discussed with: CRNA and Anesthesiologist  Anesthesia Plan Comments: (Spinal w L adductor)     Anesthesia Quick Evaluation

## 2021-06-09 ENCOUNTER — Ambulatory Visit (HOSPITAL_COMMUNITY): Payer: PPO | Admitting: Anesthesiology

## 2021-06-09 ENCOUNTER — Encounter (HOSPITAL_COMMUNITY)
Admission: RE | Disposition: A | Discharge status: Home / Self Care | Payer: Self-pay | Source: Home / Self Care | Attending: Orthopedic Surgery

## 2021-06-09 ENCOUNTER — Ambulatory Visit (HOSPITAL_COMMUNITY)
Admission: RE | Admit: 2021-06-09 | Discharge: 2021-06-09 | Disposition: A | Discharge status: Home / Self Care | Payer: PPO | Attending: Orthopedic Surgery | Admitting: Orthopedic Surgery

## 2021-06-09 ENCOUNTER — Encounter (HOSPITAL_COMMUNITY): Payer: Self-pay | Admitting: Orthopedic Surgery

## 2021-06-09 ENCOUNTER — Ambulatory Visit (HOSPITAL_COMMUNITY): Payer: PPO | Admitting: Physician Assistant

## 2021-06-09 DIAGNOSIS — I10 Essential (primary) hypertension: Secondary | ICD-10-CM | POA: Insufficient documentation

## 2021-06-09 DIAGNOSIS — M9712XA Periprosthetic fracture around internal prosthetic left knee joint, initial encounter: Secondary | ICD-10-CM | POA: Diagnosis not present

## 2021-06-09 DIAGNOSIS — Z79899 Other long term (current) drug therapy: Secondary | ICD-10-CM | POA: Diagnosis not present

## 2021-06-09 DIAGNOSIS — M8448XA Pathological fracture, other site, initial encounter for fracture: Secondary | ICD-10-CM | POA: Insufficient documentation

## 2021-06-09 DIAGNOSIS — M8580 Other specified disorders of bone density and structure, unspecified site: Secondary | ICD-10-CM | POA: Diagnosis not present

## 2021-06-09 DIAGNOSIS — G8918 Other acute postprocedural pain: Secondary | ICD-10-CM | POA: Diagnosis not present

## 2021-06-09 DIAGNOSIS — M1712 Unilateral primary osteoarthritis, left knee: Secondary | ICD-10-CM | POA: Diagnosis not present

## 2021-06-09 DIAGNOSIS — K219 Gastro-esophageal reflux disease without esophagitis: Secondary | ICD-10-CM | POA: Insufficient documentation

## 2021-06-09 DIAGNOSIS — M25562 Pain in left knee: Secondary | ICD-10-CM | POA: Insufficient documentation

## 2021-06-09 DIAGNOSIS — Z96652 Presence of left artificial knee joint: Secondary | ICD-10-CM | POA: Diagnosis not present

## 2021-06-09 HISTORY — PX: PATELLECTOMY: SHX1022

## 2021-06-09 SURGERY — PATELLECTOMY
Anesthesia: Regional | Site: Knee | Laterality: Left

## 2021-06-09 MED ORDER — METHOCARBAMOL 500 MG PO TABS: 500.0000 mg | ORAL_TABLET | Freq: Four times a day (QID) | ORAL | 0 refills | Status: DC | PRN

## 2021-06-09 MED ORDER — MEPIVACAINE HCL (PF) 2 % IJ SOLN
INTRAMUSCULAR | Status: DC | PRN
  Administered 2021-06-09: 60 mg via INTRATHECAL

## 2021-06-09 MED ORDER — ACETAMINOPHEN 10 MG/ML IV SOLN: 1000.0000 mg | Freq: Once | INTRAVENOUS | Status: DC | PRN

## 2021-06-09 MED ORDER — EPHEDRINE 5 MG/ML INJ
INTRAVENOUS | Status: AC
  Filled 2021-06-09: qty 5

## 2021-06-09 MED ORDER — PROPOFOL 500 MG/50ML IV EMUL
INTRAVENOUS | Status: DC | PRN
  Administered 2021-06-09: 75 ug/kg/min via INTRAVENOUS

## 2021-06-09 MED ORDER — ONDANSETRON HCL 4 MG/2ML IJ SOLN
INTRAMUSCULAR | Status: AC
  Filled 2021-06-09: qty 2

## 2021-06-09 MED ORDER — ORAL CARE MOUTH RINSE: 15.0000 mL | Freq: Once | OROMUCOSAL | Status: AC

## 2021-06-09 MED ORDER — FENTANYL CITRATE PF 50 MCG/ML IJ SOSY
25.0000 ug | PREFILLED_SYRINGE | INTRAMUSCULAR | Status: DC | PRN
  Administered 2021-06-09: 50 ug via INTRAVENOUS

## 2021-06-09 MED ORDER — ASPIRIN EC 81 MG PO TBEC: 81.0000 mg | DELAYED_RELEASE_TABLET | Freq: Every day | ORAL | 0 refills | Status: AC

## 2021-06-09 MED ORDER — CHLORHEXIDINE GLUCONATE 0.12 % MT SOLN
15.0000 mL | Freq: Once | OROMUCOSAL | Status: AC
  Administered 2021-06-09: 15 mL via OROMUCOSAL

## 2021-06-09 MED ORDER — CEFAZOLIN SODIUM-DEXTROSE 2-4 GM/100ML-% IV SOLN
2.0000 g | INTRAVENOUS | Status: AC
  Administered 2021-06-09: 2 g via INTRAVENOUS
  Filled 2021-06-09: qty 100

## 2021-06-09 MED ORDER — CHLORHEXIDINE GLUCONATE 4 % EX LIQD
60.0000 mL | Freq: Once | CUTANEOUS | Status: AC
  Administered 2021-06-09: 4 via TOPICAL

## 2021-06-09 MED ORDER — FENTANYL CITRATE PF 50 MCG/ML IJ SOSY
50.0000 ug | PREFILLED_SYRINGE | INTRAMUSCULAR | Status: DC
  Administered 2021-06-09: 25 ug via INTRAVENOUS
  Filled 2021-06-09: qty 2

## 2021-06-09 MED ORDER — FENTANYL CITRATE PF 50 MCG/ML IJ SOSY
PREFILLED_SYRINGE | INTRAMUSCULAR | Status: AC
  Filled 2021-06-09: qty 1

## 2021-06-09 MED ORDER — PROPOFOL 1000 MG/100ML IV EMUL
INTRAVENOUS | Status: AC
  Filled 2021-06-09: qty 100

## 2021-06-09 MED ORDER — PHENYLEPHRINE 40 MCG/ML (10ML) SYRINGE FOR IV PUSH (FOR BLOOD PRESSURE SUPPORT)
PREFILLED_SYRINGE | INTRAVENOUS | Status: AC
  Filled 2021-06-09: qty 10

## 2021-06-09 MED ORDER — DEXAMETHASONE SODIUM PHOSPHATE 10 MG/ML IJ SOLN
INTRAMUSCULAR | Status: AC
  Filled 2021-06-09: qty 1

## 2021-06-09 MED ORDER — 0.9 % SODIUM CHLORIDE (POUR BTL) OPTIME
TOPICAL | Status: DC | PRN
  Administered 2021-06-09: 1000 mL

## 2021-06-09 MED ORDER — DEXAMETHASONE SODIUM PHOSPHATE 10 MG/ML IJ SOLN
8.0000 mg | Freq: Once | INTRAMUSCULAR | Status: AC
  Administered 2021-06-09: 4 mg via INTRAVENOUS

## 2021-06-09 MED ORDER — MIDAZOLAM HCL 2 MG/2ML IJ SOLN: 1.0000 mg | INTRAMUSCULAR | Status: DC

## 2021-06-09 MED ORDER — ROPIVACAINE HCL 5 MG/ML IJ SOLN
INTRAMUSCULAR | Status: DC | PRN
  Administered 2021-06-09: 25 mL via PERINEURAL

## 2021-06-09 MED ORDER — ONDANSETRON HCL 4 MG/2ML IJ SOLN
INTRAMUSCULAR | Status: DC | PRN
Start: 2021-06-09 — End: 2021-06-09
  Administered 2021-06-09: 4 mg via INTRAVENOUS

## 2021-06-09 MED ORDER — PROPOFOL 10 MG/ML IV BOLUS
INTRAVENOUS | Status: DC | PRN
  Administered 2021-06-09: 20 mg via INTRAVENOUS

## 2021-06-09 MED ORDER — EPHEDRINE 5 MG/ML INJ
INTRAVENOUS | Status: AC
  Filled 2021-06-09: qty 10

## 2021-06-09 MED ORDER — EPHEDRINE SULFATE-NACL 50-0.9 MG/10ML-% IV SOSY
PREFILLED_SYRINGE | INTRAVENOUS | Status: DC | PRN
  Administered 2021-06-09 (×2): 5 mg via INTRAVENOUS
  Administered 2021-06-09 (×3): 10 mg via INTRAVENOUS
  Administered 2021-06-09: 5 mg via INTRAVENOUS
  Administered 2021-06-09: 10 mg via INTRAVENOUS
  Administered 2021-06-09: 5 mg via INTRAVENOUS

## 2021-06-09 MED ORDER — LACTATED RINGERS IV SOLN: INTRAVENOUS | Status: DC

## 2021-06-09 MED ORDER — CLONIDINE HCL (ANALGESIA) 100 MCG/ML EP SOLN
EPIDURAL | Status: DC | PRN
  Administered 2021-06-09: 100 ug

## 2021-06-09 MED ORDER — ONDANSETRON HCL 4 MG/2ML IJ SOLN: 4.0000 mg | Freq: Once | INTRAMUSCULAR | Status: DC | PRN

## 2021-06-09 MED ORDER — POVIDONE-IODINE 10 % EX SWAB: 2.0000 "application " | Freq: Once | CUTANEOUS | Status: DC

## 2021-06-09 MED ORDER — ACETAMINOPHEN 10 MG/ML IV SOLN
1000.0000 mg | Freq: Four times a day (QID) | INTRAVENOUS | Status: DC
  Administered 2021-06-09: 1000 mg via INTRAVENOUS
  Filled 2021-06-09: qty 100

## 2021-06-09 MED ORDER — HYDROCODONE-ACETAMINOPHEN 5-325 MG PO TABS: 1.0000 | ORAL_TABLET | Freq: Four times a day (QID) | ORAL | 0 refills | Status: DC | PRN

## 2021-06-09 MED ORDER — PHENYLEPHRINE 40 MCG/ML (10ML) SYRINGE FOR IV PUSH (FOR BLOOD PRESSURE SUPPORT)
PREFILLED_SYRINGE | INTRAVENOUS | Status: DC | PRN
  Administered 2021-06-09: 160 ug via INTRAVENOUS
  Administered 2021-06-09: 80 ug via INTRAVENOUS
  Administered 2021-06-09 (×3): 120 ug via INTRAVENOUS

## 2021-06-09 SURGICAL SUPPLY — 50 items
BAG COUNTER SPONGE SURGICOUNT (BAG) IMPLANT
BAG SPNG CNTER NS LX DISP (BAG)
BAG ZIPLOCK 12X15 (MISCELLANEOUS) ×2 IMPLANT
BANDAGE ESMARK 6X9 LF (GAUZE/BANDAGES/DRESSINGS) ×1 IMPLANT
BIT DRILL 2.8X128 (BIT) ×2 IMPLANT
BNDG COHESIVE 6X5 TAN ST LF (GAUZE/BANDAGES/DRESSINGS) ×2 IMPLANT
BNDG ELASTIC 3X5.8 VLCR STR LF (GAUZE/BANDAGES/DRESSINGS) ×2 IMPLANT
BNDG ESMARK 6X9 LF (GAUZE/BANDAGES/DRESSINGS) ×2
BNDG GAUZE ELAST 4 BULKY (GAUZE/BANDAGES/DRESSINGS) ×2 IMPLANT
COVER SURGICAL LIGHT HANDLE (MISCELLANEOUS) ×2 IMPLANT
CUFF TOURN SGL QUICK 34 (TOURNIQUET CUFF) ×2
CUFF TRNQT CYL 34X4.125X (TOURNIQUET CUFF) IMPLANT
DECANTER SPIKE VIAL GLASS SM (MISCELLANEOUS) ×2 IMPLANT
DRSG EMULSION OIL 3X16 NADH (GAUZE/BANDAGES/DRESSINGS) ×2 IMPLANT
DRSG EMULSION OIL 3X3 NADH (GAUZE/BANDAGES/DRESSINGS) ×2 IMPLANT
DRSG PAD ABDOMINAL 8X10 ST (GAUZE/BANDAGES/DRESSINGS) ×2 IMPLANT
DURAPREP 26ML APPLICATOR (WOUND CARE) ×2 IMPLANT
ELECT REM PT RETURN 15FT ADLT (MISCELLANEOUS) ×2 IMPLANT
GAUZE SPONGE 4X4 12PLY STRL (GAUZE/BANDAGES/DRESSINGS) ×2 IMPLANT
GLOVE SRG 8 PF TXTR STRL LF DI (GLOVE) ×1 IMPLANT
GLOVE SURG ENC TEXT LTX SZ7 (GLOVE) ×2 IMPLANT
GLOVE SURG ENC TEXT LTX SZ8 (GLOVE) ×2 IMPLANT
GLOVE SURG NEOPR MICRO LF SZ8 (GLOVE) ×2 IMPLANT
GLOVE SURG UNDER POLY LF SZ6.5 (GLOVE) ×2 IMPLANT
GLOVE SURG UNDER POLY LF SZ8 (GLOVE) ×2
GLOVE SURG UNDER POLY LF SZ8.5 (GLOVE) ×2 IMPLANT
GOWN STRL REUS W/TWL LRG LVL3 (GOWN DISPOSABLE) ×4 IMPLANT
GOWN STRL REUS W/TWL XL LVL3 (GOWN DISPOSABLE) ×2 IMPLANT
KIT TURNOVER KIT A (KITS) IMPLANT
MANIFOLD NEPTUNE II (INSTRUMENTS) ×2 IMPLANT
NS IRRIG 1000ML POUR BTL (IV SOLUTION) ×2 IMPLANT
PACK ORTHO EXTREMITY (CUSTOM PROCEDURE TRAY) ×2 IMPLANT
PADDING CAST ABS 3INX4YD NS (CAST SUPPLIES) ×1
PADDING CAST ABS COTTON 3X4 (CAST SUPPLIES) IMPLANT
PASSER SUT SWANSON 36MM LOOP (INSTRUMENTS) ×2 IMPLANT
PROTECTOR NERVE ULNAR (MISCELLANEOUS) ×2 IMPLANT
SPONGE T-LAP 18X18 ~~LOC~~+RFID (SPONGE) ×4 IMPLANT
SPONGE T-LAP 4X18 ~~LOC~~+RFID (SPONGE) IMPLANT
STRIP CLOSURE SKIN 1/2X4 (GAUZE/BANDAGES/DRESSINGS) ×1 IMPLANT
SUT ETHIBOND NAB CT1 #1 30IN (SUTURE) ×4 IMPLANT
SUT VIC AB 0 CT1 27 (SUTURE) ×4
SUT VIC AB 0 CT1 27XBRD ANTBC (SUTURE) ×2 IMPLANT
SUT VIC AB 1 CT1 27 (SUTURE) ×2
SUT VIC AB 1 CT1 27XBRD ANTBC (SUTURE) ×1 IMPLANT
SUT VIC AB 2-0 CT1 27 (SUTURE) ×4
SUT VIC AB 2-0 CT1 27XBRD (SUTURE) ×2 IMPLANT
TOWEL OR 17X26 10 PK STRL BLUE (TOWEL DISPOSABLE) ×4 IMPLANT
TRAY CATH INTERMITTENT SS 16FR (CATHETERS) ×1 IMPLANT
TUBE SUCTION HIGH CAP CLEAR NV (SUCTIONS) ×1 IMPLANT
WATER STERILE IRR 1000ML POUR (IV SOLUTION) ×2 IMPLANT

## 2021-06-09 NOTE — Anesthesia Postprocedure Evaluation (Signed)
Anesthesia Post Note  Patient: Julie Bonilla  Procedure(s) Performed: Left knee partial patellectomy (Left: Knee)     Patient location during evaluation: Nursing Unit Anesthesia Type: Regional and Spinal Level of consciousness: oriented and awake and alert Pain management: pain level controlled Vital Signs Assessment: post-procedure vital signs reviewed and stable Respiratory status: spontaneous breathing and respiratory function stable Cardiovascular status: blood pressure returned to baseline and stable Postop Assessment: no headache, no backache, no apparent nausea or vomiting and patient able to bend at knees Anesthetic complications: no   No notable events documented.  Last Vitals:  Vitals:   06/09/21 1606 06/09/21 1615  BP: 117/67 126/87  Pulse: 65 64  Resp: 11 18  Temp:    SpO2: 100% 99%    Last Pain:  Vitals:   06/09/21 1615  TempSrc:   PainSc: 0-No pain                 Barnet Glasgow

## 2021-06-09 NOTE — Anesthesia Procedure Notes (Addendum)
Anesthesia Regional Block: Adductor canal block   Pre-Anesthetic Checklist: , timeout performed,  Correct Patient, Correct Site, Correct Laterality,  Correct Procedure, Correct Position, site marked,  Risks and benefits discussed,  Surgical consent,  Pre-op evaluation,  At surgeon's request and post-op pain management  Laterality: Lower and Left  Prep: chloraprep       Needles:  Injection technique: Single-shot  Needle Type: Echogenic Needle     Needle Length: 9cm  Needle Gauge: 22     Additional Needles:   Procedures:,,,, ultrasound used (permanent image in chart),,    Narrative:  Start time: 06/09/2021 2:01 PM End time: 06/09/2021 2:08 PM Injection made incrementally with aspirations every 5 mL.  Performed by: Personally  Anesthesiologist: Barnet Glasgow, MD  Additional Notes: Block assessed prior to surgery. Pt tolerated procedure well.

## 2021-06-09 NOTE — Brief Op Note (Signed)
06/09/2021  3:41 PM  PATIENT:  Julie Bonilla  81 y.o. female  PRE-OPERATIVE DIAGNOSIS:  Left patella periprosthetic fracture  POST-OPERATIVE DIAGNOSIS:  Left patella periprosthetic fracture  PROCEDURE:  Procedure(s): Left knee partial patellectomy (Left)  SURGEON:  Surgeon(s) and Role:    Gaynelle Arabian, MD - Primary  PHYSICIAN ASSISTANT:   ASSISTANTS: Theresa Duty, PA-C   ANESTHESIA:   spinal and adductor canal block  EBL:  10 ml   BLOOD ADMINISTERED:none  DRAINS: none   LOCAL MEDICATIONS USED:  NONE  COUNTS:  YES  TOURNIQUET:   Total Tourniquet Time Documented: Thigh (laterality) - 14 minutes Total: Thigh (laterality) - 14 minutes   DICTATION: .Other Dictation: Dictation Number [id number]  PLAN OF CARE: Discharge to home after PACU  PATIENT DISPOSITION:  PACU - hemodynamically stable.

## 2021-06-09 NOTE — Op Note (Signed)
NAME: Julie Bonilla, Julie Bonilla MEDICAL RECORD NO: [id number] ACCOUNT NO: [id number] DATE OF BIRTH: 1940-09-20 FACILITY: [facility] LOCATION: [facility] PHYSICIAN: Dione Plover. Dhanush Jokerst, MD  Operative Report   DATE OF PROCEDURE: 06/09/2021   PREOPERATIVE DIAGNOSIS:  Left patella periprosthetic fracture.  POSTOPERATIVE DIAGNOSIS:  Left patella periprosthetic fracture.  PROCEDURE:  Left knee partial patellectomy.  SURGEON:  Dione Plover. Kamala Kolton, MD  ASSISTANT:  Theresa Duty, PA-C  ANESTHESIA:  Spinal and adductor canal block.  ESTIMATED BLOOD LOSS:  Less than 10 mL.  DRAIN: None.  TOURNIQUET TIME:  14 minutes at 300 mmHg.  COMPLICATIONS:  None.  CONDITION:  Stable to recovery.  BRIEF CLINICAL NOTE:  The patient is an 81 year old female who had a left total knee arthroplasty done approximately 6 months ago.  She was doing fine and about a month ago started to have some infrapatellar pain.  X-rays showed that she had avulsed  fragment off the inferior pole of the patella, which was possibly an avascular fragment.  She was braced initially and then it progressively increased her activity level.  Unfortunately, she was not improving.  Given the small size of the fragment and  unlikelihood that any type of bony fusion would occur with fixation, it was decided to remove the fragment.  Her extensor mechanism was functioning normally.  Thus, this was not attached to the tendon.  She presents to [name] today for a partial patellectomy  for pain relief.  DESCRIPTION OF PROCEDURE:  After the successful administration of adductor canal block and spinal anesthetic, the patient's left lower extremity was prepped and draped in the usual sterile fashion.  Midline incision was made with a 10 blade through the  subcutaneous tissue to the level of the extensor mechanism.  A fresh blade was used to make a small lateral arthrotomy.  The knee joint was thoroughly irrigated with saline solution.  I was able to visualize the  patellar component, which is in good  position and is well fixed.  There was some soft tissue overlying this inferior bony fragment that soft tissues were removed.  I then removed the bony fragment by dissecting the soft tissue off of it.  The tendon is completely intact.  The fragments were  removed and the patellar component is stable.  The wound was further irrigated with saline solution and then the arthrotomy closed with interrupted #1 Vicryl suture.  Flexion against gravity was 135 degrees.  Tourniquet was released total time of 14  minutes.  Minor bleeding stopped with cautery.  Subcutaneous was then closed with interrupted 2-0 Vicryl and subcuticular running 4-0 Monocryl.  The incision was cleaned and dried and Steri-Strips and a sterile dressing applied.  She was then awakened  and transported to recovery in stable condition.     SUJ D: 06/09/2021 3:45:25 pm T: 06/09/2021 11:44:00 pm  JOB: [id number]/ [id number]

## 2021-06-09 NOTE — Transfer of Care (Signed)
Immediate Anesthesia Transfer of Care Note  Patient: Julie Bonilla  Procedure(s) Performed: Left knee partial patellectomy (Left: Knee)  Patient Location: PACU  Anesthesia Type:MAC, Regional and Spinal  Level of Consciousness: awake, alert  and oriented  Airway & Oxygen Therapy: Patient Spontanous Breathing and Patient connected to face mask  Post-op Assessment: Report given to RN and Post -op Vital signs reviewed and stable  Post vital signs: Reviewed and stable  Last Vitals:  Vitals Value Taken Time  BP    Temp    Pulse 44 06/09/21 1611  Resp 25 06/09/21 1611  SpO2 88 % 06/09/21 1611  Vitals shown include unvalidated device data.  Last Pain:  Vitals:   06/09/21 1240  TempSrc:   PainSc: 3       Patients Stated Pain Goal: 4 (00/34/91 7915)  Complications: No notable events documented.

## 2021-06-09 NOTE — Progress Notes (Signed)
AssistedDr. Houser with left, ultrasound guided, adductor canal block. Side rails up, monitors on throughout procedure. See vital signs in flow sheet. Tolerated Procedure well.  

## 2021-06-09 NOTE — H&P (Signed)
CC- Julie Bonilla is a 81 y.o. female who presents with left knee pain.  HPI- . Knee Pain: Patient presents with knee pain involving the  left knee. Onset of the symptoms was several weeks ago. Inciting event: none known. Current symptoms include crepitus sensation, giving out, and pain located anteriorly . Pain is aggravated by pivoting, rising after sitting, standing, and walking.  Patient has had prior knee problems. Evaluation to date: plain films: abnormal avulsion of small bony fragment from the inferior pole of the patella with intact knee extension . Treatment to date: brace which is not very effective and rest.  Past Medical History:  Diagnosis Date   ADHD (attention deficit hyperactivity disorder)    prob adhd/ld   Allergy    SEASONAL   Anxiety    Arthritis    knees, hips   BRONCHIECTASIS 10/24/2006   Qualifier: Diagnosis of  By: Regis Bill MD, Standley Brooking    Bunion 03/24/2011   repaired [name] feet   CARCINOMA, BASAL CELL 10/24/2006   Qualifier: Diagnosis of  By: Hulan Saas, CMA (AAMA), Quita Skye    Constipation    at times   Depression    DENNIES   Diverticulosis    Family history of adverse reaction to anesthesia    sister was slow to wake up   GERD (gastroesophageal reflux disease)    Hard of hearing    no hearing aids   History of skin cancer    basal cell face and back    Hx of adenomatous colonic polyps 11/28/2014   Hyperlipidemia    Hypertension    [place]    Hx of struck when age 83 is a twin   Lower GI bleed 11/2014   post-polypectomy   Osteopenia 04/2005   dexa    Peripheral neuropathy    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 Silvano Rusk   FOOT SURGERY     [place] 2013   KNEE ARTHROSCOPY Right 01/30/2013   Procedure: RIGHT KNEE ARTHROSCOPY WITH MEDIAL AND LATERAL MENISCUS DEBRIDEMENT AND CONDROPLASTY  ;   Surgeon: Gearlean Alf, MD;  Location: [facility];  Service: Orthopedics;  Laterality: Right;   KNEE SURGERY Left    MOHS SURGERY     on face and back   TONSILLECTOMY  06/07/1967   TOTAL KNEE ARTHROPLASTY Right 07/29/2019   Procedure: TOTAL KNEE ARTHROPLASTY;  Surgeon: Gaynelle Arabian, MD;  Location: [facility];  Service: Orthopedics;  Laterality: Right;  49min   TOTAL KNEE ARTHROPLASTY Left 10/12/2020   Procedure: TOTAL KNEE ARTHROPLASTY;  Surgeon: Gaynelle Arabian, MD;  Location: [facility];  Service: Orthopedics;  Laterality: Left;   TUBAL LIGATION  06/06/1974    Prior to Admission medications   Medication Sig Start Date End Date Taking? Authorizing Provider  amLODipine (NORVASC) 5 MG tablet TAKE ONE TABLET BY MOUTH ONE TIME DAILY *office visit needed for further refills* 04/20/21  Yes Panosh, Standley Brooking, MD  B Complex-C (SUPER B COMPLEX PO) Take 1 tablet by mouth daily.   Yes [provider]  Calcium Carb-Cholecalciferol (CALCIUM + D3 PO) Take 1 tablet by mouth in the morning and at bedtime. Calcium Citrate   Yes [provider]  cetirizine (ZYRTEC) 10 MG tablet Take 10 mg by mouth daily as needed for allergies.    Yes [provider]  Coenzyme Q10 (CO Q 10 PO) Take 1 tablet by mouth at bedtime.   Yes [provider]  diphenhydrAMINE HCl, Sleep, 25 MG TBDP Take 25 mg by mouth at bedtime. [place] sleep aid   Yes [provider]  DULoxetine (CYMBALTA) 60 MG capsule TAKE 1 CAPSULE BY MOUTH TWICE DAILY 05/26/21  Yes Panosh, Standley Brooking, MD  ezetimibe (ZETIA) 10 MG tablet TAKE ONE TABLET BY MOUTH ONE TIME DAILY Patient taking differently: Take 10 mg by mouth at bedtime. 04/06/21  Yes Panosh, Standley Brooking, MD  gabapentin (NEURONTIN) 300 MG capsule take 1 capsule by mouth in the morning and 2 capsules at bedtime or as directed 04/01/21  Yes Panosh, Standley Brooking, MD  losartan (COZAAR) 100 MG tablet TAKE ONE TABLET BY MOUTH ONE TIME DAILY 05/20/21  Yes Panosh, Standley Brooking, MD   Magnesium Oxide (MAG-200) 200 MG TABS Take 200 mg by mouth daily in the afternoon.   Yes [provider]  Multiple Vitamin (MULTIVITAMIN WITH MINERALS) TABS tablet Take 1 tablet by mouth daily. One-A-Day   Yes [provider]  omeprazole (PRILOSEC) 20 MG capsule Take 20 mg by mouth daily as needed (acid indigestion/heartburn/GERD). 09/15/20  Yes [provider]  Polyethyl Glycol-Propyl Glycol (LUBRICANT EYE DROPS) 0.4-0.3 % SOLN Place 1 drop into both eyes 3 (three) times daily as needed (dry/irritated eyes.).   Yes [provider]  psyllium (METAMUCIL SMOOTH TEXTURE) 28 % packet Take 1 packet by mouth daily as needed (constipation).   Yes [provider]  risedronate (ACTONEL) 35 MG tablet TAKE ONE TABLET BY MOUTH ONCE WEEKLY WITH WATER ON EMPTY STOMACH (DON'T EAT/DRINK OR LIE DOWN FOR THE NEXT 30MINS) 05/05/21  Yes Panosh, Standley Brooking, MD  sodium chloride (OCEAN) 0.65 % SOLN nasal spray Place 1 spray into both nostrils as needed (dry nose and mouth).   Yes [provider]  fluticasone (FLONASE) 50 MCG/ACT nasal spray Place 2 sprays into both nostrils daily. Patient not taking: Reported on 05/25/2021 12/27/17   Collene Gobble, MD  polyethylene glycol powder (GLYCOLAX/MIRALAX) 17 GM/SCOOP powder 1 cap full in a full glass of water, two times a day for 3 days. Patient not taking: Reported on 05/25/2021 07/18/19   Carrie Mew, MD  traZODone (DESYREL) 50 MG tablet Take 0.5 tablets (25 mg total) by mouth at bedtime. Increase to 50 - 75 mg if needed for sleep, (stop the mirtazapine) Patient not taking: Reported on 05/25/2021 04/28/21   Panosh, Standley Brooking, MD  zolpidem (AMBIEN) 5 MG tablet TAKE ONE TABLET BY MOUTH AT BEDTIME AS NEEDED for sleep *avoid regular use* Patient not taking: Reported on 05/25/2021 04/29/20   Panosh, Standley Brooking, MD   Left Knee antalgic gait, no warmth or effusion, reduced range of motion (0-110), collateral ligaments intact, tender  inferior patella  Physical Examination: General appearance - alert, well appearing, and in no distress Mental status - alert, oriented to person, place, and time Chest - clear to auscultation, no wheezes, rales or rhonchi, symmetric air entry Heart - normal rate, regular rhythm, normal S1, S2, no murmurs, rubs, clicks or gallops Abdomen - soft, nontender, nondistended, no masses or organomegaly Neurological - alert, oriented, normal speech, no focal findings or movement disorder noted  Asessment/Plan--- Left knee partial inferior patellar avulsion - Plan partial patellectomy of the avulsed fragment  Procedure risks and potential comps discussed with patient who elects to proceed. Goals are decreased pain and increased function with a high likelihood of achieving  both

## 2021-06-09 NOTE — Discharge Instructions (Addendum)
Julie Arabian, MD [facility] [place] [address], [address] [place], [place] [address] [phone]  POSTOPERATIVE DIRECTIONS  HOME CARE INSTRUCTIONS  Remove items at home which could result in a fall. This includes throw rugs or furniture in walking pathways.  ICE to the affected knee as much as tolerated. Icing helps control swelling. If the swelling is well controlled you will be more comfortable and rehab easier. Continue to use ice on the knee for pain and swelling from surgery. You may notice swelling that will progress down to the foot and ankle. This is normal after surgery. Elevate the leg when you are not up walking on it.    Continue to use the breathing machine which will help keep your temperature down. It is common for your temperature to cycle up and down following surgery, especially at night when you are not up moving around and exerting yourself. The breathing machine keeps your lungs expanded and your temperature down.  DIET You may resume your previous home diet once you are discharged from the hospital.  DRESSING / WOUND CARE / SHOWERING Keep your bulky bandage on for 2 days. On the third post-operative day you may remove the Ace bandage and gauze. There is a waterproof adhesive bandage on your skin which will stay in place until your first follow-up appointment. Once you remove this you will not need to place another bandage You may begin showering 3 days following surgery, but do not submerge the incision under water.  ACTIVITY You should rest, ice and elevate the leg for 50 minutes out of every hour. Get up and walk/stretch for 10 minutes per hour. After 5 days you can increase your activity slowly as tolerated. Walk with your walker as instructed. Use the walker until you are comfortable transitioning to a cane. Walk with the cane in the opposite hand of the operative leg. You may discontinue the cane once you are comfortable and  walking steadily. Avoid periods of inactivity such as sitting longer than an hour when not asleep. This helps prevent blood clots.  You may discontinue the knee immobilizer once you are able to perform a straight leg raise while lying down. You may resume a sexual relationship in one month or when given the OK by your doctor.  You may return to work once you are cleared by your doctor.  Do not drive a car for 6 weeks or until released by your surgeon.  Do not drive while taking narcotics.  TED HOSE STOCKINGS Wear the elastic stockings on both legs for three weeks following surgery during the day. You may remove them at night for sleeping.  WEIGHT BEARING Weight bearing as tolerated with assist device (walker, cane, etc) as directed, use it as long as suggested by your surgeon or therapist, typically at least 4-6 weeks.  POSTOPERATIVE CONSTIPATION PROTOCOL Constipation - defined medically as fewer than three stools per week and severe constipation as less than one stool per week.  One of the most common issues patients have following surgery is constipation.  Even if you have a regular bowel pattern at home, your normal regimen is likely to be disrupted due to multiple reasons following surgery.  Combination of anesthesia, postoperative narcotics, change in appetite and fluid intake all can affect your bowels.  In order to avoid complications following surgery, here are some recommendations in order to help you during your recovery period.  Colace (docusate) - Pick up an over-the-counter form of  Colace or another stool softener and take twice a day as long as you are requiring postoperative pain medications.  Take with a full glass of water daily.  If you experience loose stools or diarrhea, hold the colace until you stool forms back up. If your symptoms do not get better within 1 week or if they get worse, check with your doctor. Dulcolax (bisacodyl) - Pick up over-the-counter and take as  directed by the product packaging as needed to assist with the movement of your bowels.  Take with a full glass of water.  Use this product as needed if not relieved by Colace only.  MiraLax (polyethylene glycol) - Pick up over-the-counter to have on hand. MiraLax is a solution that will increase the amount of water in your bowels to assist with bowel movements.  Take as directed and can mix with a glass of water, juice, soda, coffee, or tea. Take if you go more than two days without a movement. Do not use MiraLax more than once per day. Call your doctor if you are still constipated or irregular after using this medication for 7 days in a row.  If you continue to have problems with postoperative constipation, please contact the office for further assistance and recommendations.  If you experience "the worst abdominal pain ever" or develop nausea or vomiting, please contact the office immediatly for further recommendations for treatment.  ITCHING If you experience itching with your medications, try taking only a single pain pill, or even half a pain pill at a time.  You can also use Benadryl over the counter for itching or also to help with sleep.   MEDICATIONS See your medication summary on the After Visit Summary that the nursing staff will review with you prior to discharge.  You may have some home medications which will be placed on hold until you complete the course of blood thinner medication.  It is important for you to complete the blood thinner medication as prescribed by your surgeon.  Continue your approved medications as instructed at time of discharge.  PRECAUTIONS If you experience chest pain or shortness of breath - call 911 immediately for transfer to the hospital emergency department.  If you develop a fever greater that 101 F, purulent drainage from wound, increased redness or drainage from wound, foul odor from the wound/dressing, or calf pain - CONTACT YOUR SURGEON.                                                    FOLLOW-UP APPOINTMENTS Make sure you keep all of your appointments after your operation with your surgeon and caregivers. You should call the office at the above phone number and make an appointment for approximately two weeks after the date of your surgery or on the date instructed by your surgeon outlined in the "After Visit Summary".  POST-OPERATIVE OPIOID TAPER INSTRUCTIONS: It is important to wean off of your opioid medication as soon as possible. If you do not need pain medication after your surgery it is ok to stop day one. Opioids include: Codeine, Hydrocodone(Norco, Vicodin), Oxycodone(Percocet, oxycontin) and hydromorphone amongst others.  Long term and even short term use of opiods can cause: Increased pain response Dependence Constipation Depression Respiratory depression And more.  Withdrawal symptoms can include Flu like symptoms Nausea, vomiting And more Techniques to manage  these symptoms Hydrate well Eat regular healthy meals Stay active Use relaxation techniques(deep breathing, meditating, yoga) Do Not substitute Alcohol to help with tapering If you have been on opioids for less than two weeks and do not have pain than it is ok to stop all together.  Plan to wean off of opioids This plan should start within one week post op of your joint replacement. Maintain the same interval or time between taking each dose and first decrease the dose.  Cut the total daily intake of opioids by one tablet each day Next start to increase the time between doses. The last dose that should be eliminated is the evening dose.   MAKE SURE YOU:  Understand these instructions.  Get help right away if you are not doing well or get worse.    Pick up stool softner and laxative for home use following surgery while on pain medications. Do not submerge incision under water. Please use good hand washing techniques while changing dressing each day. May  shower starting three days after surgery. Please use a clean towel to pat the incision dry following showers. Continue to use ice for pain and swelling after surgery. Do not use any lotions or creams on the incision until instructed by your surgeon.

## 2021-06-10 ENCOUNTER — Encounter (HOSPITAL_COMMUNITY): Payer: Self-pay | Admitting: Orthopedic Surgery

## 2021-06-11 NOTE — Anesthesia Procedure Notes (Signed)
Spinal  Patient location during procedure: OR Start time: 06/11/2021 2:48 PM End time: 06/09/2021 2:54 PM Reason for block: surgical anesthesia Staffing Resident/CRNA: Rosaland Lao, CRNA Preanesthetic Checklist Completed: patient identified, IV checked, site marked, risks and benefits discussed, surgical consent, monitors and equipment checked, pre-op evaluation and timeout performed Spinal Block Patient position: sitting Prep: DuraPrep Patient monitoring: heart rate, cardiac monitor, continuous pulse ox and blood pressure Approach: midline Location: L3-4 Injection technique: single-shot Needle Needle type: Sprotte and Pencan  Needle gauge: 24 G Needle length: 9 cm Needle insertion depth: 5 cm Assessment Sensory level: T4 Events: CSF return Additional Notes 1 Attempt Pt Tolerated procedure well

## 2021-06-11 NOTE — Addendum Note (Signed)
Addendum  created 06/11/21 1149 by Barnet Glasgow, MD   Child order released for a procedure order, Clinical Note Signed, Intraprocedure Blocks edited, SmartForm saved

## 2021-06-16 ENCOUNTER — Telehealth: Payer: Self-pay | Admitting: Internal Medicine

## 2021-06-16 NOTE — Telephone Encounter (Signed)
Patient called in for refill on Duloxetine Please call in RX to [facility] in [place] [address] [phone]  Patient states she does not have any more

## 2021-06-17 MED ORDER — DULOXETINE HCL 60 MG PO CPEP: 60.0000 mg | ORAL_CAPSULE | Freq: Two times a day (BID) | ORAL | 0 refills | Status: DC

## 2021-06-17 NOTE — Telephone Encounter (Signed)
Pt confirms that medication should be sent to [facility] in [place] is too far for her now. Pt also notified that PCP request f/u OV (noted in [place] on 05/04/21). Appt scheduled.

## 2021-06-19 ENCOUNTER — Other Ambulatory Visit: Payer: Self-pay | Admitting: Internal Medicine

## 2021-06-22 DIAGNOSIS — Z471 Aftercare following joint replacement surgery: Secondary | ICD-10-CM | POA: Diagnosis not present

## 2021-06-22 DIAGNOSIS — Z96652 Presence of left artificial knee joint: Secondary | ICD-10-CM | POA: Diagnosis not present

## 2021-06-24 DIAGNOSIS — D101 Benign neoplasm of tongue: Secondary | ICD-10-CM | POA: Diagnosis not present

## 2021-06-29 ENCOUNTER — Other Ambulatory Visit: Payer: Self-pay | Admitting: Internal Medicine

## 2021-06-29 ENCOUNTER — Ambulatory Visit (INDEPENDENT_AMBULATORY_CARE_PROVIDER_SITE_OTHER): Payer: PPO | Admitting: Internal Medicine

## 2021-06-29 ENCOUNTER — Encounter: Payer: Self-pay | Admitting: Internal Medicine

## 2021-06-29 VITALS — BP 140/86 | HR 79 | Temp 98.2°F | Ht 59.0 in | Wt 131.0 lb

## 2021-06-29 DIAGNOSIS — Z4789 Encounter for other orthopedic aftercare: Secondary | ICD-10-CM | POA: Diagnosis not present

## 2021-06-29 DIAGNOSIS — G4700 Insomnia, unspecified: Secondary | ICD-10-CM | POA: Diagnosis not present

## 2021-06-29 DIAGNOSIS — E785 Hyperlipidemia, unspecified: Secondary | ICD-10-CM

## 2021-06-29 DIAGNOSIS — I10 Essential (primary) hypertension: Secondary | ICD-10-CM | POA: Diagnosis not present

## 2021-06-29 DIAGNOSIS — Z79899 Other long term (current) drug therapy: Secondary | ICD-10-CM | POA: Diagnosis not present

## 2021-06-29 DIAGNOSIS — G609 Hereditary and idiopathic neuropathy, unspecified: Secondary | ICD-10-CM | POA: Diagnosis not present

## 2021-06-29 MED ORDER — DULOXETINE HCL 30 MG PO CPEP: 30.0000 mg | ORAL_CAPSULE | Freq: Every day | ORAL | 1 refills | Status: DC

## 2021-06-29 MED ORDER — ZOLPIDEM TARTRATE 5 MG PO TABS: 5.0000 mg | ORAL_TABLET | Freq: Every evening | ORAL | 1 refills | Status: DC | PRN

## 2021-06-29 NOTE — Patient Instructions (Addendum)
We can try again   [place]  realizing can be dependent producing.    So wean off the trazodone    Will send in the [place]  to try again .  Decrease  cymbalta   to 60 + 30 per day total 90 mg    Plan  ru in    3 months and go from there.
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
## 2021-07-05 DIAGNOSIS — Z1231 Encounter for screening mammogram for malignant neoplasm of breast: Secondary | ICD-10-CM | POA: Diagnosis not present

## 2021-07-05 LAB — HM MAMMOGRAPHY

## 2021-07-07 ENCOUNTER — Other Ambulatory Visit: Payer: Self-pay | Admitting: Internal Medicine

## 2021-07-08 ENCOUNTER — Encounter: Payer: Self-pay | Admitting: Internal Medicine

## 2021-07-08 ENCOUNTER — Other Ambulatory Visit: Payer: Self-pay | Admitting: Internal Medicine

## 2021-07-12 DIAGNOSIS — L570 Actinic keratosis: Secondary | ICD-10-CM | POA: Diagnosis not present

## 2021-07-12 DIAGNOSIS — Z85828 Personal history of other malignant neoplasm of skin: Secondary | ICD-10-CM | POA: Diagnosis not present

## 2021-07-12 DIAGNOSIS — L814 Other melanin hyperpigmentation: Secondary | ICD-10-CM | POA: Diagnosis not present

## 2021-07-12 DIAGNOSIS — D2262 Melanocytic nevi of left upper limb, including shoulder: Secondary | ICD-10-CM | POA: Diagnosis not present

## 2021-07-12 DIAGNOSIS — L821 Other seborrheic keratosis: Secondary | ICD-10-CM | POA: Diagnosis not present

## 2021-07-12 DIAGNOSIS — L738 Other specified follicular disorders: Secondary | ICD-10-CM | POA: Diagnosis not present

## 2021-07-13 DIAGNOSIS — Z471 Aftercare following joint replacement surgery: Secondary | ICD-10-CM | POA: Diagnosis not present

## 2021-07-13 DIAGNOSIS — Z4789 Encounter for other orthopedic aftercare: Secondary | ICD-10-CM | POA: Diagnosis not present

## 2021-07-17 ENCOUNTER — Other Ambulatory Visit: Payer: Self-pay | Admitting: Internal Medicine

## 2021-08-01 ENCOUNTER — Other Ambulatory Visit: Payer: Self-pay | Admitting: Internal Medicine

## 2021-08-06 ENCOUNTER — Encounter: Payer: Self-pay | Admitting: Physician Assistant

## 2021-08-06 ENCOUNTER — Ambulatory Visit (INDEPENDENT_AMBULATORY_CARE_PROVIDER_SITE_OTHER): Payer: PPO | Admitting: Physician Assistant

## 2021-08-06 VITALS — BP 118/70 | HR 72 | Ht 59.75 in | Wt 131.0 lb

## 2021-08-06 DIAGNOSIS — Z86010 Personal history of colonic polyps: Secondary | ICD-10-CM

## 2021-08-06 NOTE — Patient Instructions (Addendum)
If you are age 81 or older, your body mass index should be between 23-30. Your Body mass index is 25.8 kg/m?Marland Kitchen If this is out of the aforementioned range listed, please consider follow up with your Primary Care Provider. ?________________________________________________________ ? ?The [facility] providers would like to encourage you to use [facility] to communicate with providers for non-urgent requests or questions.  Due to long hold times on the telephone, sending your provider a message by [facility] may be a faster and more efficient way to get a response.  Please allow 48 business hours for a response.  Please remember that this is for non-urgent requests.  ?_______________________________________________________ ? ?Call the office to schedule a follow up with Dr. Carlean Purl in early Fall 2024 to discuss having a Colonoscopy. ? ?Thank you for entrusting me with your care and choosing [facility]. ? ?Nicoletta Ba, PA-C ?

## 2021-08-06 NOTE — Progress Notes (Signed)
Subjective:    Patient ID: Julie Bonilla, female    DOB: July 01, 1940, 81 y.o.   MRN: [id number]  HPI Julie Bonilla  is an 81 year old white female, known to Dr. Carlean Purl, with history of adenomatous polyps, diverticulosis, GERD, bronchiectasis, hypertension, hyperlipidemia who comes in today to discuss follow-up colonoscopy.  She says she was advised by her home health/insurance nurse that she may be due for colonoscopy. Patient has history of adenomatous polyps and last underwent colonoscopy in October 2019 with removal of 3 diminutive sessile polyps and was also noted to have multiple diverticuli.  Path showed 3 diminutive tubular adenomas, no high-grade dysplasia, and no follow-up surveillance colonoscopy was recommended due to age.  Patient says she is not having any current problems, does have issues with mild constipation but uses Metamucil and then stool softeners as needed which she says works for her.  She is not having any abdominal pain, has not noted any melena or hematochezia.  She is actually being prescribed a PPI by ENT and is not having any current issues.  Patient says that whenever she is due for follow-up colonoscopy she definitely wants to have this done, and she wants to be aggressive with her health management.  Review of Systems Pertinent positive and negative review of systems were noted in the above HPI section.  All other review of systems was otherwise negative.   Outpatient Encounter Medications as of 08/06/2021  Medication Sig   amLODipine (NORVASC) 5 MG tablet TAKE ONE TABLET BY MOUTH ONE TIME DAILY   B Complex-C (SUPER B COMPLEX PO) Take 1 tablet by mouth daily.   Calcium Carb-Cholecalciferol (CALCIUM + D3 PO) Take 1 tablet by mouth in the morning and at bedtime. Calcium Citrate   cetirizine (ZYRTEC) 10 MG tablet Take 10 mg by mouth daily as needed for allergies.    Coenzyme Q10 (CO Q 10 PO) Take 1 tablet by mouth at bedtime.   diphenhydrAMINE HCl, Sleep, 25 MG TBDP  Take 25 mg by mouth at bedtime. [place] sleep aid   DULoxetine (CYMBALTA) 30 MG capsule Take 1 capsule (30 mg total) by mouth daily. With 60 mg to total 90 mg   DULoxetine (CYMBALTA) 60 MG capsule Take 1 capsule (60 mg total) by mouth 2 (two) times daily.   ezetimibe (ZETIA) 10 MG tablet TAKE ONE TABLET BY MOUTH ONE TIME DAILY   gabapentin (NEURONTIN) 300 MG capsule TAKE ONE CAPSULE BY MOUTH IN THE MORNING AND TWO AT BEDTIME or as directed   HYDROcodone-acetaminophen (NORCO/VICODIN) 5-325 MG tablet Take 1-2 tablets by mouth every 6 (six) hours as needed for moderate pain or severe pain.   losartan (COZAAR) 100 MG tablet TAKE ONE TABLET BY MOUTH ONE TIME DAILY   Magnesium Oxide (MAG-200) 200 MG TABS Take 200 mg by mouth daily in the afternoon.   methocarbamol (ROBAXIN) 500 MG tablet Take 1 tablet (500 mg total) by mouth every 6 (six) hours as needed for muscle spasms.   Multiple Vitamin (MULTIVITAMIN WITH MINERALS) TABS tablet Take 1 tablet by mouth daily. One-A-Day   omeprazole (PRILOSEC) 20 MG capsule Take 20 mg by mouth daily as needed (acid indigestion/heartburn/GERD).   Polyethyl Glycol-Propyl Glycol (LUBRICANT EYE DROPS) 0.4-0.3 % SOLN Place 1 drop into both eyes 3 (three) times daily as needed (dry/irritated eyes.).   psyllium (METAMUCIL SMOOTH TEXTURE) 28 % packet Take 1 packet by mouth daily as needed (constipation).   risedronate (ACTONEL) 35 MG tablet take 1 tablet by mouth once a week  with water on an empty stomach **do not eat or drink or lie down for the next 30 minutes**   sodium chloride (OCEAN) 0.65 % SOLN nasal spray Place 1 spray into both nostrils as needed (dry nose and mouth).   zolpidem (AMBIEN) 5 MG tablet Take 1 tablet (5 mg total) by mouth at bedtime as needed for sleep.   No facility-administered encounter medications on file as of 08/06/2021.   Allergies  Allergen Reactions   Crestor [Rosuvastatin] Other (See Comments)    Myalgia on 5 mg per day   Erythromycin Nausea  And Vomiting    Oral  No hives rash or itching   Mobic [Meloxicam] Other (See Comments)    Moody and irritable    Patient Active Problem List   Diagnosis Date Noted   Primary osteoarthritis of left knee 10/12/2020   OA (osteoarthritis) of knee 07/29/2019   S/P total knee arthroplasty, right 07/29/2019   Osteoporosis without current pathological fracture 06/19/2018   Allergic rhinitis 12/27/2016   Chronic sinusitis 12/23/2016   Memory loss 12/04/2016   Attention deficit 12/04/2016   Hx of bacterial pneumonia 02/23/2015   Hx of adenomatous colonic polyps 11/28/2014   Muscular deconditioning 11/06/2014   Body aches 04/02/2014   Hoarseness 04/02/2014   Hyperlipidemia 04/02/2014   Urinary urgency 04/02/2014   Cough, persistent 04/02/2014   Medication management 01/21/2014   Internal nasal lesion 12/26/2013   Right nasal polyps  ?  12/26/2013   Visit for preventive health examination 07/15/2013   Essential hypertension 07/15/2013   Acute medial meniscal tear 01/29/2013   Knee pain 01/28/2013   GERD (gastroesophageal reflux disease) 04/05/2012   Episode of generalized weakness 01/12/2012   Otalgia of both ears 01/12/2012   Tremor intermittent intention 01/12/2012   Chronic throat clearing 09/25/2011   Hearing loss 03/05/2010   ACQUIRED MUSCULOSKELETAL DEFORMITY OTH SPEC SITE 06/19/2009   SKIN CANCER, HX OF 11/03/2008   BACK PAIN, LUMBAR 06/27/2007   IRON DEFICIENCY 03/27/2007   BACK PAIN, THORACIC REGION 03/27/2007   OSTEOPENIA 12/17/2006   HYPERLIPIDEMIA 10/24/2006   DEPRESSION 10/24/2006   ADHD 10/24/2006   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   Hereditary and idiopathic peripheral neuropathy 10/24/2006   BRONCHIECTASIS 10/24/2006   GERD 10/24/2006   Diverticulosis of colon (without mention of hemorrhage) 10/24/2006   CHILBLAINS 10/24/2006   ACNE ROSACEA, HX OF 10/24/2006   Social History   Socioeconomic History   Marital status: Widowed    Spouse  name: Not on file   Number of children: 3   Years of education: Not on file   Highest education level: Not on file  Occupational History   Occupation: retired  Tobacco Use   Smoking status: Never   Smokeless tobacco: Never  Vaping Use   Vaping Use: Never used  Substance and Sexual Activity   Alcohol use: Yes    Alcohol/week: 3.0 standard drinks    Types: 3 Glasses of wine per week    Comment: socially but not much    Drug use: No   Sexual activity: Yes  Other Topics Concern   Not on file  Social History Narrative   Retired   Married now widowed day before t giving  2019   [place] of 2  Living with twin sis   Pet lab   Bereaved parent  Son died of 41 08-28-2022 overwhelming infection? Kidney.   Family was hit by lightening when she was 89  young and a sib died  Age 10 in this incident   Childbirth x 3 vaginal   1 etoh per day    Is a twin   Left Handed   Lives in a one story home. Patient lives with her Identical twin sister.    Social Determinants of Health   Financial Resource Strain: Low Risk    Difficulty of Paying Living Expenses: Not hard at all  Food Insecurity: No Food Insecurity   Worried About Charity fundraiser in the Last Year: Never true   [place] in the Last Year: Never true  Transportation Needs: No Transportation Needs   Lack of Transportation (Medical): No   Lack of Transportation (Non-Medical): No  Physical Activity: Sufficiently Active   Days of Exercise per Week: 3 days   Minutes of Exercise per Session: 60 min  Stress: No Stress Concern Present   Feeling of Stress : Not at all  Social Connections: Moderately Integrated   Frequency of Communication with Friends and Family: More than three times a week   Frequency of Social Gatherings with Friends and Family: More than three times a week   Attends Religious Services: More than 4 times per year   Active Member of [employer] or Organizations: Yes   Attends Archivist Meetings: More than 4 times per  year   Marital Status: Widowed  Intimate Partner Violence: Not At Risk   Fear of Current or Ex-Partner: No   Emotionally Abused: No   Physically Abused: No   Sexually Abused: No    Julie Bonilla's family history includes Arthritis in her sister and sister; Breast cancer in her sister; COPD in her brother and sister; Diabetes in her maternal grandfather, mother, and sister; Fibromyalgia in her sister; Heart disease in her mother and sister; Kidney disease in her brother and son; Other in her son; Stroke in her mother; Stroke (age of onset: 22) in her father; Sudden death in an other family member.      Objective:    Vitals:   08/06/21 1109  BP: 118/70  Pulse: 72    Physical Exam Well-developed well-nourished  elderly WFin no acute distress.  Height, Weight,131  BMI 25.8  HEENT; nontraumatic normocephalic, EOMI, PE R LA, sclera anicteric.  Extremities; no clubbing cyanosis or edema skin warm and dry Neuro/Psych; alert and oriented x4, grossly nonfocal mood and affect appropriate        Assessment & Plan:   #11 81 year old white female, with history of adenomatous colon polyps, and last had colonoscopy in October 2019 with removal of 3 diminutive tubular adenomas.  No follow-up colonoscopy recommended due to age  Patient comes in today on the advice of a home health nurse to whether it is time for another colonoscopy.  She is currently asymptomatic  #2 mild constipation-managed with Metamucil and as needed stool softeners 3.  Diverticulosis 4.  Hypertension 5.  Bronchiectasis 6.  Hyperlipidemia  Plan; I discussed the current recommendations regarding discontinuing surveillance colonoscopies above age 53. Even if patient were under 75 she would not be indicated for a follow-up colonoscopy for 5 years with last colon showing 3 diminutive tubular adenomas.  Patient expresses desire to have another colonoscopy at the recommended interval.  I have asked her to plan to make  follow-up appointment with Dr. Carlean Purl fall 2024, and this can further be discussed based on her overall health/comorbidities.  She knows to call in the interim for any GI problems.    Isidor Bromell S Lan Mcneill PA-C  08/06/2021   Cc: [name], MD

## 2021-08-12 ENCOUNTER — Telehealth: Payer: Self-pay | Admitting: Pharmacist

## 2021-08-12 NOTE — Chronic Care Management (AMB) (Signed)
? ? ?Chronic Care Management ?Pharmacy Assistant  ? ?Name: Julie Bonilla  MRN: [id number] DOB: Oct 19, 1940 ? ?Reason for Encounter: Disease State ?  ?Conditions to be addressed/monitored: ?HLD ? ?Recent office visits:  ?06/29/21 Panosh, Standley Brooking, MD - Patient presented for Insomnia and other concerns. Prescribed Zolpidem Changed Duloxetine ? ?04/28/21 Panosh, Standley Brooking, MD - Patient presented via video for Insomnia and other concerns. Prescribed Trazodone ? ?Recent consult visits:  ?08/06/21 Alfredia Ferguson, PA-C Gert[facility] Fey) - Patient presented for History of adenomatous polyp of colon. No medication changes. ? ?06/01/21 Patient presented to [facility] for Pre Admission Testing. ? ?05/29/21 Julie Bonilla (Orthopedic Surg) - Patient presented for Aftercare visit. No other visit details available. ? ?Hospital visits:  ?Medication Reconciliation was completed by comparing discharge summary, patient?s EMR and Pharmacy list, and upon discussion with patient. ? ?Patient presented to [facility] on 06/09/21 due to Left knee partial patellectomy. Patient was present for 5 hours. ? ?New?Medications Started at [facility] Discharge:?? ?-started  ?aspirin EC ?HYDROcodone-acetaminophen  ?methocarbamol ? ?Medication Changes at Hospital Discharge: ?-Changed  ?none ? ?Medications Discontinued at Hospital Discharge: ?-Stopped  ?fluticasone 50 MCG/ACT nasal spray (FLONASE) ?polyethylene glycol powder 17 GM/SCOOP powder (GLYCOLAX/MIRALAX) ?traZODone 50 MG tablet (DESYREL) ?zolpidem 5 MG tablet ?Medications that remain the same after Hospital Discharge:??  ?-All other medications will remain the same.   ? ?Medications: ?Outpatient Encounter Medications as of 08/12/2021  ?Medication Sig  ? amLODipine (NORVASC) 5 MG tablet TAKE ONE TABLET BY MOUTH ONE TIME DAILY  ? B Complex-C (SUPER B COMPLEX PO) Take 1 tablet by mouth daily.  ? Calcium Carb-Cholecalciferol (CALCIUM + D3 PO) Take 1 tablet by mouth in the morning  and at bedtime. Calcium Citrate  ? cetirizine (ZYRTEC) 10 MG tablet Take 10 mg by mouth daily as needed for allergies.   ? Coenzyme Q10 (CO Q 10 PO) Take 1 tablet by mouth at bedtime.  ? diphenhydrAMINE HCl, Sleep, 25 MG TBDP Take 25 mg by mouth at bedtime. [place] sleep aid  ? DULoxetine (CYMBALTA) 30 MG capsule Take 1 capsule (30 mg total) by mouth daily. With 60 mg to total 90 mg  ? DULoxetine (CYMBALTA) 60 MG capsule Take 1 capsule (60 mg total) by mouth 2 (two) times daily.  ? ezetimibe (ZETIA) 10 MG tablet TAKE ONE TABLET BY MOUTH ONE TIME DAILY  ? gabapentin (NEURONTIN) 300 MG capsule TAKE ONE CAPSULE BY MOUTH IN THE MORNING AND TWO AT BEDTIME or as directed  ? HYDROcodone-acetaminophen (NORCO/VICODIN) 5-325 MG tablet Take 1-2 tablets by mouth every 6 (six) hours as needed for moderate pain or severe pain.  ? losartan (COZAAR) 100 MG tablet TAKE ONE TABLET BY MOUTH ONE TIME DAILY  ? Magnesium Oxide (MAG-200) 200 MG TABS Take 200 mg by mouth daily in the afternoon.  ? methocarbamol (ROBAXIN) 500 MG tablet Take 1 tablet (500 mg total) by mouth every 6 (six) hours as needed for muscle spasms.  ? Multiple Vitamin (MULTIVITAMIN WITH MINERALS) TABS tablet Take 1 tablet by mouth daily. One-A-Day  ? omeprazole (PRILOSEC) 20 MG capsule Take 20 mg by mouth daily as needed (acid indigestion/heartburn/GERD).  ? Polyethyl Glycol-Propyl Glycol (LUBRICANT EYE DROPS) 0.4-0.3 % SOLN Place 1 drop into both eyes 3 (three) times daily as needed (dry/irritated eyes.).  ? psyllium (METAMUCIL SMOOTH TEXTURE) 28 % packet Take 1 packet by mouth daily as needed (constipation).  ? risedronate (ACTONEL) 35 MG tablet take 1 tablet by mouth once  a week with water on an empty stomach **do not eat or drink or lie down for the next 30 minutes**  ? sodium chloride (OCEAN) 0.65 % SOLN nasal spray Place 1 spray into both nostrils as needed (dry nose and mouth).  ? zolpidem (AMBIEN) 5 MG tablet Take 1 tablet (5 mg total) by mouth at bedtime as  needed for sleep.  ? ?No facility-administered encounter medications on file as of 08/12/2021.  ?08/12/2021 ?Name: [name] MRN: [id number] DOB: [birth year] ?Julie Bonilla is a 81 y.o. year old female who is a primary care patient of Panosh, Standley Brooking, MD.  ?Comprehensive medication review performed; Spoke to patient regarding cholesterol ? ?Lipid Panel ?   ?Component Value Date/Time  ? CHOL 230 (H) 09/22/2020 1613  ? TRIG 134.0 09/22/2020 1613  ? HDL 62.40 09/22/2020 1613  ? LDLCALC 141 (H) 09/22/2020 1613  ? LDLCALC 152 (H) 12/30/2019 1003  ? LDLDIRECT 149.0 05/15/2019 1551  ?  ?10-year ASCVD risk score: The ASCVD Risk score (Arnett DK, et al., 2019) failed to calculate for the following reasons: ?  The 2019 ASCVD risk score is only valid for ages 81 to 81 ? ?Current antihyperlipidemic regimen:  ?Zetia '10mg'$ , 1 tablet daily ?Previous antihyperlipidemic medications tried:  pravastatin, rosuvastatin (statin intolerance)  ?ASCVD risk enhancing conditions: age >81 ?What recent interventions/DTPs have been made by any provider to improve Cholesterol control since last CPP Visit: Patient reports none ?Any recent hospitalizations or ED visits since last visit with CPP? No ?What diet changes have been made to improve Cholesterol?  ?Patient reports she is eating well, she reports she is including more veggies and tries to have a balanced meal daily. ?Patient reports she has also been good with her blood pressures running around 130/80 she denies any hypo/hypertensive symptoms. She lastly reports that the Ambien has been working very well for her and she has been sleeping good. She reports no concerns or issues at this time. ? ?Adherence Review: ?Does the patient have >5 day gap between last estimated fill dates? No ? ? ?Care Gaps: ?BP- 118/70 (08/06/21) ?AWV- 11/22 ?CCM- 5/23 ? ?Star Rating Drugs: ?Losartan 100 mg - Last filled 05/20/21 90 DS at [place]  ? ? ? ?Ned Clines CMA ?Clinical Pharmacist Assistant ?[phone] ? ?

## 2021-08-13 ENCOUNTER — Ambulatory Visit: Payer: PPO | Admitting: Neurology

## 2021-08-13 ENCOUNTER — Other Ambulatory Visit: Payer: Self-pay

## 2021-08-13 ENCOUNTER — Encounter: Payer: Self-pay | Admitting: Neurology

## 2021-08-13 VITALS — BP 175/85 | HR 79 | Ht 59.0 in | Wt 129.0 lb

## 2021-08-13 DIAGNOSIS — G629 Polyneuropathy, unspecified: Secondary | ICD-10-CM | POA: Diagnosis not present

## 2021-08-13 DIAGNOSIS — M5417 Radiculopathy, lumbosacral region: Secondary | ICD-10-CM

## 2021-08-13 MED ORDER — TIZANIDINE HCL 2 MG PO TABS: 2.0000 mg | ORAL_TABLET | Freq: Every evening | ORAL | 3 refills | Status: DC | PRN

## 2021-08-13 NOTE — Progress Notes (Signed)
? ? ?Follow-up Visit ? ? ?Date: 08/13/21 ? ? ?Julie Bonilla ?MRN: [id number] ?DOB: 1941-04-07 ? ? ?Interim History: ?Julie Bonilla is a 81 y.o. left-handed Caucasian female with depression, insomnia, hypertension, and neuropathy returning to the clinic for follow-up of left leg pain and neuropathy.  The patient was accompanied to the clinic by self. ? ?History of present illness: ?Starting around 2021, she was having a tight sensation over the left lower leg and foot, is if it was in a collar.  It involves her lower leg towards her ankle.  Symptoms are constant.  No worsening with rest or activity. She does not have these symptoms in the right leg.  She has chronic low back pain, denies radicular pain.  She has painful neuropathy in the feet and says that this is very different. Pain is controlled with gabapentin '300mg'$  in the morning and '600mg'$  at bedtime and Cymbalta '60mg'$ .  ? ?UPDATE 08/13/2021:  She is here for follow-up visit.  Neuropathy has been stable and well-controlled on gabapentin '300mg'$  and '600mg'$  bedtime + Cymbalta '60mg'$  daily.  She continues to have episodic tight sensation around the left lower leg and ankle, usually with prolonged standing.  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  PT was offered, but she was having upcoming knee surgery and needed to focus on that. Unfortunately, following her knee surgery she fractured her patella.  She is followed by orthopedics.Her balance is slightly worse, she walks unassisted.  ? ?Medications:  ?Current Outpatient Medications on File Prior to Visit  ?Medication Sig Dispense Refill  ? amLODipine (NORVASC) 5 MG tablet TAKE ONE TABLET BY MOUTH ONE TIME DAILY 90 tablet 0  ? B Complex-C (SUPER B COMPLEX PO) Take 1 tablet by mouth daily.    ? Calcium Carb-Cholecalciferol (CALCIUM + D3 PO) Take 1 tablet by mouth in the morning and at bedtime. Calcium Citrate    ? cetirizine (ZYRTEC) 10 MG tablet Take 10 mg by mouth daily as needed for  allergies.     ? Coenzyme Q10 (CO Q 10 PO) Take 1 tablet by mouth at bedtime.    ? diphenhydrAMINE HCl, Sleep, 25 MG TBDP Take 25 mg by mouth at bedtime. [place] sleep aid    ? DULoxetine (CYMBALTA) 30 MG capsule Take 1 capsule (30 mg total) by mouth daily. With 60 mg to total 90 mg 90 capsule 1  ? DULoxetine (CYMBALTA) 60 MG capsule Take 1 capsule (60 mg total) by mouth 2 (two) times daily. 180 capsule 0  ? ezetimibe (ZETIA) 10 MG tablet TAKE ONE TABLET BY MOUTH ONE TIME DAILY 90 tablet 0  ? gabapentin (NEURONTIN) 300 MG capsule TAKE ONE CAPSULE BY MOUTH IN THE MORNING AND TWO AT BEDTIME or as directed 270 capsule 0  ? losartan (COZAAR) 100 MG tablet TAKE ONE TABLET BY MOUTH ONE TIME DAILY 90 tablet 0  ? Magnesium Oxide (MAG-200) 200 MG TABS Take 200 mg by mouth daily in the afternoon.    ? methocarbamol (ROBAXIN) 500 MG tablet Take 1 tablet (500 mg total) by mouth every 6 (six) hours as needed for muscle spasms. 40 tablet 0  ? Multiple Vitamin (MULTIVITAMIN WITH MINERALS) TABS tablet Take 1 tablet by mouth daily. One-A-Day    ? omeprazole (PRILOSEC) 20 MG capsule Take 20 mg by mouth daily as needed (acid indigestion/heartburn/GERD).    ? Polyethyl Glycol-Propyl Glycol (LUBRICANT EYE DROPS) 0.4-0.3 % SOLN Place 1 drop into both eyes 3 (three) times  daily as needed (dry/irritated eyes.).    ? psyllium (METAMUCIL SMOOTH TEXTURE) 28 % packet Take 1 packet by mouth daily as needed (constipation).    ? sodium chloride (OCEAN) 0.65 % SOLN nasal spray Place 1 spray into both nostrils as needed (dry nose and mouth).    ? zolpidem (AMBIEN) 5 MG tablet Take 1 tablet (5 mg total) by mouth at bedtime as needed for sleep. 30 tablet 1  ? HYDROcodone-acetaminophen (NORCO/VICODIN) 5-325 MG tablet Take 1-2 tablets by mouth every 6 (six) hours as needed for moderate pain or severe pain. (Patient not taking: Reported on 08/13/2021) 20 tablet 0  ? risedronate (ACTONEL) 35 MG tablet take 1 tablet by mouth once a week with water on an  empty stomach **do not eat or drink or lie down for the next 30 minutes** (Patient not taking: Reported on 08/13/2021) 4 tablet 0  ? ?No current facility-administered medications on file prior to visit.  ? ? ?Allergies:  ?Allergies  ?Allergen Reactions  ? Crestor [Rosuvastatin] Other (See Comments)  ?  Myalgia on 5 mg per day  ? Erythromycin Nausea And Vomiting  ?  Oral  No hives rash or itching  ? Mobic [Meloxicam] Other (See Comments)  ?  Moody and irritable   ? ? ?Vital Signs:  ?BP (!) 175/85 Comment: Aggravated with Drive here  Pulse 79   Ht '4\' 11"'$  (1.499 m)   Wt 129 lb (58.5 kg)   SpO2 97%   BMI 26.05 kg/m?  ? ?Neurological Exam: ?MENTAL STATUS including orientation to time, place, person, recent and remote memory, attention span and concentration, language, and fund of knowledge is normal.  Speech is not dysarthric. ? ?CRANIAL NERVES:   Pupils equal round and reactive to light.  Normal conjugate, extra-ocular eye movements in all directions of gaze.  No ptosis.  Face is symmetric.  ? ?MOTOR:  Motor strength is 5/5 in all extremities.  No atrophy, fasciculations or abnormal movements.  No pronator drift.  Tone is normal.   ? ?MSRs:  Reflexes are 2+/4 throughout. ? ?SENSORY:  Vibration and pin prick is also reduced in the feet. Rhomberg sign is positive. ? ?COORDINATION/GAIT:  Normal finger-to- nose-finger.   Gait is mildly wide-based, unsteady at times, unassisted.   ? ?Data: ?MRI lumbar spine 09/24/2020: ?IMPRESSION: ?1. 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 ?2. Moderate right L1-2 and L2-3 neural foraminal stenosis with right lateral recess narrowing at both levels. ?3. No central spinal canal stenosis. ? 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  She denies radicular leg pain, but does endorse episodic low back pain ? - Start tizanidine '2mg'$  at bedtime as needed for low back pain ? - PT  declined, patient prefers to do home exercises.  Low back exercises were provided ? ?2.  Idiopathic peripheral neuropathy, stable and pain is well-controlled on current medications ? - Continue gabapentin '300mg'$  in the morning and '600mg'$  at bedtime + Cymbalta '60mg'$ /d ? - Recommend using cane as gait appears unsteady ? - Fall precautions discussed ? ? ?Thank you for allowing me to participate in patient's care.  If I can answer any additional questions, I would be pleased to do so.   ? ?Sincerely, ? ? ? ?Markevious Ehmke K. Posey Pronto, DO ? ? ?

## 2021-08-13 NOTE — Patient Instructions (Signed)
Low Back Sprain or Strain Rehab Ask your health care provider which exercises are safe for you. Do exercises exactly as told by your health care provider and adjust them as directed. It is normal to feel mild stretching, pulling, tightness, or discomfort as you do these exercises. Stop right away if you feel sudden pain or your pain gets worse. Do not begin these exercises until told by your health care provider. Stretching and range-of-motion exercises These exercises warm up your muscles and joints and improve the movement and flexibility of your back. These exercises also help to relieve pain, numbness, and tingling. Lumbar rotation  Lie on your back on a firm bed or the floor with your knees bent. Straighten your arms out to your sides so each arm forms a 90-degree angle (right angle) with a side of your body. Slowly move (rotate) both of your knees to one side of your body until you feel a stretch in your lower back (lumbar). Try not to let your shoulders lift off the floor. Hold this position for __________ seconds. Tense your abdominal muscles and slowly move your knees back to the starting position. Repeat this exercise on the other side of your body. Repeat __________ times. Complete this exercise __________ times a day. Single knee to chest  Lie on your back on a firm bed or the floor with both legs straight. Bend one of your knees. Use your hands to move your knee up toward your chest until you feel a gentle stretch in your lower back and buttock. Hold your leg in this position by holding on to the front of your knee. Keep your other leg as straight as possible. Hold this position for __________ seconds. Slowly return to the starting position. Repeat with your other leg. Repeat __________ times. Complete this exercise __________ times a day. Prone extension on elbows  Lie on your abdomen on a firm bed or the floor (prone position). Prop yourself up on your elbows. Use your arms  to help lift your chest up until you feel a gentle stretch in your abdomen and your lower back. This will place some of your body weight on your elbows. If this is uncomfortable, try stacking pillows under your chest. Your hips should stay down, against the surface that you are lying on. Keep your hip and back muscles relaxed. Hold this position for __________ seconds. Slowly relax your upper body and return to the starting position. Repeat __________ times. Complete this exercise __________ times a day. Strengthening exercises These exercises build strength and endurance in your back. Endurance is the ability to use your muscles for a long time, even after they get tired. Pelvic tilt This exercise strengthens the muscles that lie deep in the abdomen. Lie on your back on a firm bed or the floor with your legs extended. Bend your knees so they are pointing toward the ceiling and your feet are flat on the floor. Tighten your lower abdominal muscles to press your lower back against the floor. This motion will tilt your pelvis so your tailbone points up toward the ceiling instead of pointing to your feet or the floor. To help with this exercise, you may place a small towel under your lower back and try to push your back into the towel. Hold this position for __________ seconds. Let your muscles relax completely before you repeat this exercise. Repeat __________ times. Complete this exercise __________ times a day. Alternating arm and leg raises  Get on your hands  and knees on a firm surface. If you are on a hard floor, you may want to use padding, such as an exercise mat, to cushion your knees. Line up your arms and legs. Your hands should be directly below your shoulders, and your knees should be directly below your hips. Lift your left leg behind you. At the same time, raise your right arm and straighten it in front of you. Do not lift your leg higher than your hip. Do not lift your arm higher  than your shoulder. Keep your abdominal and back muscles tight. Keep your hips facing the ground. Do not arch your back. Keep your balance carefully, and do not hold your breath. Hold this position for __________ seconds. Slowly return to the starting position. Repeat with your right leg and your left arm. Repeat __________ times. Complete this exercise __________ times a day. Abdominal set with straight leg raise  Lie on your back on a firm bed or the floor. Bend one of your knees and keep your other leg straight. Tense your abdominal muscles and lift your straight leg up, 4-6 inches (10-15 cm) off the ground. Keep your abdominal muscles tight and hold this position for __________ seconds. Do not hold your breath. Do not arch your back. Keep it flat against the ground. Keep your abdominal muscles tense as you slowly lower your leg back to the starting position. Repeat with your other leg. Repeat __________ times. Complete this exercise __________ times a day. Single leg lower with bent knees Lie on your back on a firm bed or the floor. Tense your abdominal muscles and lift your feet off the floor, one foot at a time, so your knees and hips are bent in 90-degree angles (right angles). Your knees should be over your hips and your lower legs should be parallel to the floor. Keeping your abdominal muscles tense and your knee bent, slowly lower one of your legs so your toe touches the ground. Lift your leg back up to return to the starting position. Do not hold your breath. Do not let your back arch. Keep your back flat against the ground. Repeat with your other leg. Repeat __________ times. Complete this exercise __________ times a day. Posture and body mechanics Good posture and healthy body mechanics can help to relieve stress in your body's tissues and joints. Body mechanics refers to the movements and positions of your body while you do your daily activities. Posture is part of body  mechanics. Good posture means: Your spine is in its natural S-curve position (neutral). Your shoulders are pulled back slightly. Your head is not tipped forward (neutral). Follow these guidelines to improve your posture and body mechanics in your everyday activities. Standing  When standing, keep your spine neutral and your feet about hip-width apart. Keep a slight bend in your knees. Your ears, shoulders, and hips should line up. When you do a task in which you stand in one place for a long time, place one foot up on a stable object that is 2-4 inches (5-10 cm) high, such as a footstool. This helps keep your spine neutral. Sitting  When sitting, keep your spine neutral and keep your feet flat on the floor. Use a footrest, if necessary, and keep your thighs parallel to the floor. Avoid rounding your shoulders, and avoid tilting your head forward. When working at a desk or a computer, keep your desk at a height where your hands are slightly lower than your elbows. Slide your  chair under your desk so you are close enough to maintain good posture. When working at a computer, place your monitor at a height where you are looking straight ahead and you do not have to tilt your head forward or downward to look at the screen. Resting When lying down and resting, avoid positions that are most painful for you. If you have pain with activities such as sitting, bending, stooping, or squatting, lie in a position in which your body does not bend very much. For example, avoid curling up on your side with your arms and knees near your chest (fetal position). If you have pain with activities such as standing for a long time or reaching with your arms, lie with your spine in a neutral position and bend your knees slightly. Try the following positions: Lying on your side with a pillow between your knees. Lying on your back with a pillow under your knees. Lifting  When lifting objects, keep your feet at least  shoulder-width apart and tighten your abdominal muscles. Bend your knees and hips and keep your spine neutral. It is important to lift using the strength of your legs, not your back. Do not lock your knees straight out. Always ask for help to lift heavy or awkward objects. This information is not intended to replace advice given to you by your health care provider. Make sure you discuss any questions you have with your health care provider. Document Revised: 08/10/2020 Document Reviewed: 08/10/2020 Elsevier Patient Education  [employer].

## 2021-08-14 ENCOUNTER — Other Ambulatory Visit: Payer: Self-pay | Admitting: Internal Medicine

## 2021-08-25 DIAGNOSIS — M7061 Trochanteric bursitis, right hip: Secondary | ICD-10-CM | POA: Diagnosis not present

## 2021-08-25 DIAGNOSIS — Z96652 Presence of left artificial knee joint: Secondary | ICD-10-CM | POA: Diagnosis not present

## 2021-08-25 DIAGNOSIS — Z471 Aftercare following joint replacement surgery: Secondary | ICD-10-CM | POA: Diagnosis not present

## 2021-08-29 ENCOUNTER — Other Ambulatory Visit: Payer: Self-pay | Admitting: Internal Medicine

## 2021-08-30 ENCOUNTER — Ambulatory Visit: Payer: PPO | Admitting: Internal Medicine

## 2021-09-01 ENCOUNTER — Other Ambulatory Visit: Payer: Self-pay

## 2021-09-01 MED ORDER — RISEDRONATE SODIUM 35 MG PO TABS: ORAL_TABLET | ORAL | 6 refills | Status: DC

## 2021-09-01 NOTE — Telephone Encounter (Signed)
Last Ov 06/29/21 ?Filled 08/02/21 ?Is it ok to refill? ?

## 2021-09-01 NOTE — Telephone Encounter (Signed)
Please refill for 6 months 

## 2021-09-01 NOTE — Telephone Encounter (Signed)
Please add refills for 6 month

## 2021-09-10 ENCOUNTER — Other Ambulatory Visit: Payer: Self-pay | Admitting: Internal Medicine

## 2021-09-14 NOTE — Telephone Encounter (Signed)
Last refill/office visit 06/29/21 ?

## 2021-09-27 ENCOUNTER — Other Ambulatory Visit (INDEPENDENT_AMBULATORY_CARE_PROVIDER_SITE_OTHER): Payer: PPO

## 2021-09-27 DIAGNOSIS — Z79899 Other long term (current) drug therapy: Secondary | ICD-10-CM | POA: Diagnosis not present

## 2021-09-27 DIAGNOSIS — G609 Hereditary and idiopathic neuropathy, unspecified: Secondary | ICD-10-CM

## 2021-09-27 DIAGNOSIS — E785 Hyperlipidemia, unspecified: Secondary | ICD-10-CM

## 2021-09-27 LAB — LIPID PANEL
Cholesterol: 230 mg/dL — ABNORMAL HIGH (ref 0–200)
HDL: 69.9 mg/dL (ref >39.00)
LDL Cholesterol: 136 mg/dL — ABNORMAL HIGH (ref 0–99)
NonHDL: 160.26
Total CHOL/HDL Ratio: 3
Triglycerides: 120 mg/dL (ref 0.0–149.0)
VLDL: 24 mg/dL (ref 0.0–40.0)

## 2021-09-27 LAB — HEPATIC FUNCTION PANEL
ALT: 18 U/L (ref 0–35)
AST: 19 U/L (ref 0–37)
Albumin: 4.2 g/dL (ref 3.5–5.2)
Alkaline Phosphatase: 56 U/L (ref 39–117)
Bilirubin, Direct: 0.1 mg/dL (ref 0.0–0.3)
Total Bilirubin: 0.5 mg/dL (ref 0.2–1.2)
Total Protein: 6.6 g/dL (ref 6.0–8.3)

## 2021-09-27 LAB — HEMOGLOBIN A1C: Hgb A1c MFr Bld: 5.7 % (ref 4.6–6.5)

## 2021-10-02 ENCOUNTER — Other Ambulatory Visit: Payer: Self-pay | Admitting: Internal Medicine

## 2021-10-03 NOTE — Progress Notes (Signed)
Results stable  will review at upcoming visit

## 2021-10-05 ENCOUNTER — Encounter: Payer: Self-pay | Admitting: Internal Medicine

## 2021-10-05 ENCOUNTER — Ambulatory Visit (INDEPENDENT_AMBULATORY_CARE_PROVIDER_SITE_OTHER): Payer: PPO | Admitting: Internal Medicine

## 2021-10-05 VITALS — BP 135/70 | HR 78 | Temp 98.6°F | Ht 59.0 in | Wt 129.2 lb

## 2021-10-05 DIAGNOSIS — Z8709 Personal history of other diseases of the respiratory system: Secondary | ICD-10-CM | POA: Diagnosis not present

## 2021-10-05 DIAGNOSIS — Z79899 Other long term (current) drug therapy: Secondary | ICD-10-CM

## 2021-10-05 DIAGNOSIS — I10 Essential (primary) hypertension: Secondary | ICD-10-CM

## 2021-10-05 DIAGNOSIS — E785 Hyperlipidemia, unspecified: Secondary | ICD-10-CM | POA: Diagnosis not present

## 2021-10-05 DIAGNOSIS — Z85828 Personal history of other malignant neoplasm of skin: Secondary | ICD-10-CM | POA: Diagnosis not present

## 2021-10-05 DIAGNOSIS — Z789 Other specified health status: Secondary | ICD-10-CM | POA: Diagnosis not present

## 2021-10-05 DIAGNOSIS — G4700 Insomnia, unspecified: Secondary | ICD-10-CM | POA: Diagnosis not present

## 2021-10-05 DIAGNOSIS — D485 Neoplasm of uncertain behavior of skin: Secondary | ICD-10-CM | POA: Diagnosis not present

## 2021-10-05 DIAGNOSIS — L821 Other seborrheic keratosis: Secondary | ICD-10-CM | POA: Diagnosis not present

## 2021-10-05 DIAGNOSIS — C44319 Basal cell carcinoma of skin of other parts of face: Secondary | ICD-10-CM | POA: Diagnosis not present

## 2021-10-05 DIAGNOSIS — L570 Actinic keratosis: Secondary | ICD-10-CM | POA: Diagnosis not present

## 2021-10-05 MED ORDER — ZOLPIDEM TARTRATE 5 MG PO TABS: ORAL_TABLET | ORAL | 2 refills | Status: DC

## 2021-10-05 NOTE — Progress Notes (Signed)
Noted  
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
## 2021-10-05 NOTE — Patient Instructions (Addendum)
Your last renal function was  normal   12 27 .   ?Caution with nsaids such as  aleve and advil products. To protect kidneys . ?Blood sugar is good.  ?Cholesterol stable.  ?Glad the [name]  is helping.   ?Can continue  refill as needed.  ?Continue monitor BP . Better.  ? ?If progressing   coughing  up Luce phelgm we can add antibiotic   but lungs are clear today . ? ? ?

## 2021-10-07 ENCOUNTER — Telehealth: Payer: Self-pay | Admitting: Internal Medicine

## 2021-10-07 NOTE — Telephone Encounter (Signed)
Last Ov 10/05/21 ?Please advise ?

## 2021-10-07 NOTE — Telephone Encounter (Signed)
Pt call and stated she want dr.Panosh to send the medication that they talk about on her last visit to  ?[id number] - [name], [place] - [place] Phone:  [phone]  ?Fax:  [phone]  ?  ? ?

## 2021-10-07 NOTE — Telephone Encounter (Signed)
Which medication Are we  talking about ?adding antibiotic?    I sent in ambien to the [facility]   please advise

## 2021-10-07 NOTE — Telephone Encounter (Addendum)
Attempted to call patient phone rang and no voicemail to left message to get more info  ?

## 2021-10-11 NOTE — Telephone Encounter (Signed)
---  Caller states she was seen on Tuesday her dr was ?was told if she wanted a prescription for her congestion ?to call back. She now wants the script. Her symptoms ?aren't worse, in fact her sore throat is better. No fevers. ?She uses [facility] [phone]. --RN told caller that per directives new medications ?are not handled after hours, she will have to call on ?Monday for the prescription or go to an [facility]. ? ?10/08/2021 6:16:30 PM Clinical Call Prudence Davidson, RN, Clarise Cruz ? ?

## 2021-10-12 ENCOUNTER — Other Ambulatory Visit: Payer: Self-pay

## 2021-10-12 ENCOUNTER — Telehealth: Payer: Self-pay | Admitting: Internal Medicine

## 2021-10-12 MED ORDER — DOXYCYCLINE HYCLATE 100 MG PO TABS: 100.0000 mg | ORAL_TABLET | Freq: Two times a day (BID) | ORAL | 0 refills | Status: AC

## 2021-10-12 NOTE — Telephone Encounter (Signed)
Rx sent to the pharmacy.

## 2021-10-12 NOTE — Telephone Encounter (Signed)
I still do not have enough information !!! ? ?I am assuming that she is coughing up  infected looking phlegm  ? ?And we are talking about an antibiotic.? ? ?Please send in  doxycycline 100 mg 1 po bid for 7 days to her pharmacy  ? ? ? ? ?

## 2021-10-12 NOTE — Telephone Encounter (Signed)
Pt called stating she is still coughing and would need a antibiotic sent to [facility] in [place]. I let her know that Dr. Regis Bill is aware and will be sending in her med soon but no guarantee it would be today.  ? ?[facility] [place], [place] Phone:  [phone]  ?Fax:  [phone]  ?  ? ?Please advise.  ?

## 2021-10-12 NOTE — Telephone Encounter (Signed)
Pt calling back to check on progress of a prescription her cough per previous conversation with provider.  States cough is persisting.  ?[facility] [place], [place] - [place] Phone:  [phone]  ?Fax:  [phone]  ?  ? ?

## 2021-10-14 ENCOUNTER — Other Ambulatory Visit: Payer: Self-pay | Admitting: Internal Medicine

## 2021-10-15 NOTE — Telephone Encounter (Signed)
error 

## 2021-10-19 ENCOUNTER — Telehealth: Payer: Self-pay | Admitting: Pharmacist

## 2021-10-19 NOTE — Chronic Care Management (AMB) (Signed)
? ? ?  Chronic Care Management ?Pharmacy Assistant  ? ?Name: Julie Bonilla  MRN: [id number] DOB: 15-Jun-1940 ? ?10/19/21 APPOINTMENT REMINDER ? ?Patient was reminded to have all medications, supplements and any blood glucose and blood pressure readings available for review with Jeni Salles, Pharm. D, for telephone visit on 10/20/21 at 10:30. ? ? ? ?Care Gaps: ?BP- 135/70 ?AWV- 11/22 ? ?Star Rating Drug: ?Losartan 100 mg - Last filled 05/20/21 90 DS at [facility] ? ?  ? ? ? ? ?Medications: ?Outpatient Encounter Medications as of 10/19/2021  ?Medication Sig  ? amLODipine (NORVASC) 5 MG tablet TAKE ONE TABLET BY MOUTH ONE TIME DAILY  ? B Complex-C (SUPER B COMPLEX PO) Take 1 tablet by mouth daily.  ? Calcium Carb-Cholecalciferol (CALCIUM + D3 PO) Take 1 tablet by mouth in the morning and at bedtime. Calcium Citrate  ? Coenzyme Q10 (CO Q 10 PO) Take 1 tablet by mouth at bedtime.  ? doxycycline (VIBRA-TABS) 100 MG tablet Take 1 tablet (100 mg total) by mouth 2 (two) times daily for 7 days.  ? DULoxetine (CYMBALTA) 30 MG capsule Take 1 capsule (30 mg total) by mouth daily. With 60 mg to total 90 mg  ? DULoxetine (CYMBALTA) 60 MG capsule Take 1 capsule (60 mg total) by mouth 2 (two) times daily.  ? ezetimibe (ZETIA) 10 MG tablet TAKE ONE TABLET BY MOUTH ONE TIME DAILY  ? gabapentin (NEURONTIN) 300 MG capsule TAKE ONE CAPSULE BY MOUTH IN THE MORNING AND TWO AT BEDTIME or as directed  ? losartan (COZAAR) 100 MG tablet TAKE ONE TABLET BY MOUTH ONE TIME DAILY  ? Magnesium Oxide (MAG-200) 200 MG TABS Take 200 mg by mouth daily in the afternoon.  ? Multiple Vitamin (MULTIVITAMIN WITH MINERALS) TABS tablet Take 1 tablet by mouth daily. One-A-Day  ? omeprazole (PRILOSEC) 20 MG capsule Take 20 mg by mouth daily as needed (acid indigestion/heartburn/GERD).  ? Polyethyl Glycol-Propyl Glycol (LUBRICANT EYE DROPS) 0.4-0.3 % SOLN Place 1 drop into both eyes 3 (three) times daily as needed (dry/irritated eyes.).  ? psyllium (METAMUCIL SMOOTH  TEXTURE) 28 % packet Take 1 packet by mouth daily as needed (constipation).  ? risedronate (ACTONEL) 35 MG tablet Take 1 tablet by mouth once a week with water on an empty stomach**don't eat or drink or lie down for the next 30 minutes**  ? sodium chloride (OCEAN) 0.65 % SOLN nasal spray Place 1 spray into both nostrils as needed (dry nose and mouth).  ? tiZANidine (ZANAFLEX) 2 MG tablet Take 1 tablet (2 mg total) by mouth at bedtime as needed for muscle spasms.  ? zolpidem (AMBIEN) 5 MG tablet take 1 tablet by mouth daily at bedtime as needed for sleep  ? ?No facility-administered encounter medications on file as of 10/19/2021.  ? ? ? ? ? ?Ned Clines CMA ?Clinical Pharmacist Assistant ?[phone] ? ?

## 2021-10-20 ENCOUNTER — Ambulatory Visit (INDEPENDENT_AMBULATORY_CARE_PROVIDER_SITE_OTHER): Payer: PPO | Admitting: Pharmacist

## 2021-10-20 DIAGNOSIS — G4700 Insomnia, unspecified: Secondary | ICD-10-CM

## 2021-10-20 DIAGNOSIS — I10 Essential (primary) hypertension: Secondary | ICD-10-CM

## 2021-10-20 NOTE — Progress Notes (Signed)
? ?Chronic Care Management ?Pharmacy Note ? ?10/20/2021 ?Name:  Julie Bonilla MRN:  [id number] DOB:  06/18/1940 ? ?Summary: ?BP is at goal < 140/90 ?Pt is sleeping much better ? ?Recommendations/Changes made from today's visit: ?-Recommended continued BP monitoring at home ?-Recommended use of diclofenac gel to replace the Aleve to avoid consistent NSAID use ? ?Plan: ?Follow up BP assessment in 3 months ?Follow up in 6 months ? ?Subjective: ?Julie Bonilla is an 81 y.o. year old female who is a primary patient of Panosh, Standley Brooking, MD.  The CCM team was consulted for assistance with disease management and care coordination needs.   ? ?Engaged with patient by telephone for follow up visit in response to provider referral for pharmacy case management and/or care coordination services.  ? ?Consent to Services:  ?The patient was given information about Chronic Care Management services, agreed to services, and gave verbal consent prior to initiation of services.  Please see initial visit note for detailed documentation.  ? ?Patient Care Team: ?Panosh, Standley Brooking, MD as PCP - General ?Gaynelle Arabian, MD as Consulting Physician (Orthopedic Surgery) ?Pedro Earls, MD as Attending Physician (Family Medicine) ?Izora Gala, MD as Consulting Physician (Otolaryngology) ?Luberta Mutter, MD as Consulting Physician (Ophthalmology) ?Viona Gilmore, [facility] as Pharmacist (Pharmacist) ?Alda Berthold, DO as Consulting Physician (Neurology) ? ?Recent office visits: ?10/05/21 Shanon Ace, MD: Patient presented for insomnia follow up. ? ?06/29/21 Panosh, Standley Brooking, MD - Patient presented for Insomnia and other concerns. Discontinued trazodone. Prescribed Zolpidem Changed Duloxetine to 90 mg daily. ?  ?04/28/21 Panosh, Standley Brooking, MD - Patient presented via video for Insomnia and other concerns. Prescribed Trazodone. ? ?04/20/21 Rolene Arbour, LPN: Patient presented for AWV. ? ?Recent consult visits: ?08/13/21 Donika K. Posey Pronto DO (Neurology):  Patient presented for neuropathy follow up. Prescribed tizanidine PRN. ? ?08/06/21 Esterwood, Amy S, PA-C Gertie Fey) - Patient presented for History of adenomatous polyp of colon. No medication changes. ?  ?06/01/21 Patient presented to [facility] for Pre Admission Testing. ?  ?05/29/21 Gaynelle Arabian (Orthopedic Surg) - Patient presented for Aftercare visit. No other visit details available. ? ?Hospital visits: ?Medication Reconciliation was completed by comparing discharge summary, patient?s EMR and Pharmacy list, and upon discussion with patient. ?  ?Patient presented to [facility] on 06/09/21 due to Left knee partial patellectomy. Patient was present for 5 hours. ?  ?New?Medications Started at [facility] Discharge:?? ?-started  ?aspirin EC ?HYDROcodone-acetaminophen  ?methocarbamol ?  ?Medication Changes at Hospital Discharge: ?-Changed  ?none ?  ?Medications Discontinued at Hospital Discharge: ?-Stopped  ?fluticasone 50 MCG/ACT nasal spray (FLONASE) ?polyethylene glycol powder 17 GM/SCOOP powder (GLYCOLAX/MIRALAX) ?traZODone 50 MG tablet (DESYREL) ?zolpidem 5 MG tablet ?Medications that remain the same after Hospital Discharge:??  ?-All other medications will remain the same.  ? ?Objective: ? ?Lab Results  ?Component Value Date  ? CREATININE 0.72 06/01/2021  ? BUN 18 06/01/2021  ? GFR 76.55 09/22/2020  ? GFRNONAA >60 06/01/2021  ? GFRAA >60 07/30/2019  ? NA 137 06/01/2021  ? K 4.3 06/01/2021  ? CALCIUM 8.8 (L) 06/01/2021  ? CO2 25 06/01/2021  ? GLUCOSE 115 (H) 06/01/2021  ? ? ?Lab Results  ?Component Value Date/Time  ? HGBA1C 5.7 09/27/2021 10:47 AM  ? HGBA1C 5.5 09/22/2020 04:13 PM  ? GFR 76.55 09/22/2020 04:13 PM  ? GFR 75.74 05/15/2019 03:51 PM  ?  ?Last diabetic Eye exam:  ?Lab Results  ?Component Value Date/Time  ? HMDIABEYEEXA Done  by Dr. Cheral Marker- Cateract surgery 06/07/2007 12:00 AM  ?  ?Last diabetic Foot exam: No results found for: HMDIABFOOTEX  ? ?Lab Results  ?Component  Value Date  ? CHOL 230 (H) 09/27/2021  ? HDL 69.90 09/27/2021  ? LDLCALC 136 (H) 09/27/2021  ? LDLDIRECT 149.0 05/15/2019  ? TRIG 120.0 09/27/2021  ? CHOLHDL 3 09/27/2021  ? ? ? ?  Latest Ref Rng & Units 09/27/2021  ? 10:47 AM 10/08/2020  ? 11:21 AM 09/22/2020  ?  4:13 PM  ?Hepatic Function  ?Total Protein 6.0 - 8.3 g/dL 6.6   6.6   6.5    ?Albumin 3.5 - 5.2 g/dL 4.2   4.0   4.1    ?AST 0 - 37 U/L 19   21   22     ?ALT 0 - 35 U/L 18   21   21     ?Alk Phosphatase 39 - 117 U/L 56   53   54    ?Total Bilirubin 0.2 - 1.2 mg/dL 0.5   0.7   0.5    ?Bilirubin, Direct 0.0 - 0.3 mg/dL 0.1    0.1    ? ? ?Lab Results  ?Component Value Date/Time  ? TSH 1.37 09/22/2020 04:13 PM  ? TSH 1.95 11/01/2017 09:54 AM  ? ? ? ?  Latest Ref Rng & Units 06/01/2021  ?  1:00 PM 10/13/2020  ?  3:41 AM 10/08/2020  ? 11:21 AM  ?CBC  ?WBC 4.0 - 10.5 K/uL 6.1   12.2   5.4    ?Hemoglobin 12.0 - 15.0 g/dL 13.3   10.5   13.8    ?Hematocrit 36.0 - 46.0 % 40.6   33.0   42.5    ?Platelets 150 - 400 K/uL 276   222   248    ? ? ?Lab Results  ?Component Value Date/Time  ? VD25OH 49 03/05/2010 09:52 PM  ? ? ?Clinical ASCVD: No  ?The ASCVD Risk score (Arnett DK, et al., 2019) failed to calculate for the following reasons: ?  The 2019 ASCVD risk score is only valid for ages 30 to 6   ? ? ?  10/05/2021  ? 10:53 AM 04/20/2021  ?  2:47 PM 01/14/2020  ?  3:47 PM  ?Depression screen PHQ 2/9  ?Decreased Interest 0 0 0  ?Down, Depressed, Hopeless 0 0 0  ?PHQ - 2 Score 0 0 0  ?Altered sleeping 0  0  ?Tired, decreased energy 0  0  ?Change in appetite 0  0  ?Feeling bad or failure about yourself  0    ?Trouble concentrating 0  0  ?Moving slowly or fidgety/restless 0  0  ?Suicidal thoughts 0  0  ?PHQ-9 Score 0  0  ?Difficult doing work/chores Not difficult at all  Not difficult at all  ?  ? ? ?Social History  ? ?Tobacco Use  ?Smoking Status Never  ?Smokeless Tobacco Never  ? ?BP Readings from Last 3 Encounters:  ?10/05/21 135/70  ?08/13/21 (!) 175/85  ?08/06/21 118/70  ? ?Pulse  Readings from Last 3 Encounters:  ?10/05/21 78  ?08/13/21 79  ?08/06/21 72  ? ?Wt Readings from Last 3 Encounters:  ?10/05/21 129 lb 3.2 oz (58.6 kg)  ?08/13/21 129 lb (58.5 kg)  ?08/06/21 131 lb (59.4 kg)  ? ?BMI Readings from Last 3 Encounters:  ?10/05/21 26.10 kg/m?  ?08/13/21 26.05 kg/m?  ?08/06/21 25.80 kg/m?  ? ? ?Assessment/Interventions: Review of patient past medical history, allergies, medications, health  status, including review of consultants reports, laboratory and other test data, was performed as part of comprehensive evaluation and provision of chronic care management services.  ? ?SDOH:  (Social Determinants of Health) assessments and interventions performed: No ? ?SDOH Screenings  ? ?Alcohol Screen: Not on file  ?Depression (PHQ2-9): Low Risk   ? PHQ-2 Score: 0  ?Financial Resource Strain: Low Risk   ? Difficulty of Paying Living Expenses: Not hard at all  ?Food Insecurity: No Food Insecurity  ? Worried About Charity fundraiser in the Last Year: Never true  ? Ran Out of Food in the Last Year: Never true  ?Housing: Low Risk   ? Last Housing Risk Score: 0  ?Physical Activity: Insufficiently Active  ? Days of Exercise per Week: 3 days  ? Minutes of Exercise per Session: 30 min  ?Social Connections: Moderately Integrated  ? Frequency of Communication with Friends and Family: Three times a week  ? Frequency of Social Gatherings with Friends and Family: Once a week  ? Attends Religious Services: More than 4 times per year  ? Active Member of Clubs or Organizations: No  ? Attends Archivist Meetings: More than 4 times per year  ? Marital Status: Widowed  ?Stress: Unknown  ? Feeling of Stress : Patient refused  ?Tobacco Use: Low Risk   ? Smoking Tobacco Use: Never  ? Smokeless Tobacco Use: Never  ? Passive Exposure: Not on file  ?Transportation Needs: No Transportation Needs  ? Lack of Transportation (Medical): No  ? Lack of Transportation (Non-Medical): No  ? ? ?CCM Care Plan ? ?Allergies   ?Allergen Reactions  ? Crestor [Rosuvastatin] Other (See Comments)  ?  Myalgia on 5 mg per day  ? Erythromycin Nausea And Vomiting  ?  Oral  No hives rash or itching  ? Mobic [Meloxicam] Other (See Comments)

## 2021-10-20 NOTE — Patient Instructions (Addendum)
Hi Julie Bonilla, ? ?It was great to catch up with you again! I am glad that you are sleeping much better. ? ?Don't forget to try to use more of the diclofenac or Voltaren gel than the Aleve if you can help it. ? ?Please reach out to me if you have any questions or need anything before our follow up! ? ?Best, ?Maddie ? ?Jeni Salles, PharmD, BCACP ?Clinical Pharmacist ?Therapist, music at [place] ?[phone] ? ? Visit Information ? ? Goals Addressed   ?None ?  ? ?Patient Care Plan: [place]  ?  ? ?Problem Identified: Problem: Hypertension, Hyperlipidemia, GERD, Osteoporosis, Osteoarthritis, Allergic Rhinitis and Insomnia, Nerve Pain, Constipation   ?  ? ?Long-Range Goal: Patient-Specific Goal   ?Start Date: 10/23/2020  ?Expected End Date: 10/23/2021  ?Recent Progress: On track  ?Priority: High  ?Note:   ?Current Barriers:  ?Unable to independently monitor therapeutic efficacy ? ?Pharmacist Clinical Goal(s):  ?Patient will achieve adherence to monitoring guidelines and medication adherence to achieve therapeutic efficacy ?achieve control of cholesterol as evidenced by next lipid panel  through collaboration with PharmD and provider.  ? ?Interventions: ?1:1 collaboration with Panosh, Standley Brooking, MD regarding development and update of comprehensive plan of care as evidenced by provider attestation and co-signature ?Inter-disciplinary care team collaboration (see longitudinal plan of care) ?Comprehensive medication review performed; medication list updated in electronic medical record ? ?Hypertension (BP goal <140/90) ?-Controlled ?-Current treatment: ?Losartan 100 mg 1 tablet daily - Appropriate, Effective, Safe, Accessible ?Amlodipine 5 mg 1 tablet daily - Appropriate, Effective, Safe, Accessible ?-Medications previously tried: none  ?-Current home readings: 135/80 (was writing them down) - arm cuff; checking 1-2 times a week ?-Current dietary habits: did not discuss ?-Current exercise habits: limited due  to current pain level ?-Denies hypotensive/hypertensive symptoms ?-Educated on Exercise goal of 150 minutes per week; ?Importance of home blood pressure monitoring; ?Proper BP monitoring technique; ?-Counseled to monitor BP at home weekly, document, and provide log at future appointments ?-Counseled on diet and exercise extensively ?Recommended to continue current medication ? ?Hyperlipidemia: (LDL goal < 100) ?-Uncontrolled ?-Current treatment: ?Zetia '10mg'$ , 1 tablet daily - Appropriate, Effective, Safe, Accessible ?-Medications previously tried: pravastatin, rosuvastatin (statin intolerance)  ?-Current dietary patterns: doesn't eat fried foods very often; lots of fruits and vegetables, canola or olive oil ?-Current exercise habits: limited due to current pain ?-Educated on Cholesterol goals;  ?Benefits of statin for ASCVD risk reduction; ?Importance of limiting foods high in cholesterol; ?Exercise goal of 150 minutes per week; ?-Counseled on diet and exercise extensively ?Recommended to continue current medication ?Recommended for patient to consider lipid clinic referral depending on results of next lipid panel. ? ?Osteopenia (Goal prevent fractures) ?-Uncontrolled ?-Last DEXA Scan: 07/06/20  ? T-Score femoral neck: -1.9 ? T-Score total hip: n/a ? T-Score lumbar spine: n/a ? T-Score forearm radius: n/a ? 10-year probability of major osteoporotic fracture: 15% ? 10-year probability of hip fracture: 4.1% ?-Patient is a candidate for pharmacologic treatment due to T-Score -1.0 to -2.5 and 10-year risk of hip fracture > 3% ?-Current treatment  ?risedronate '35mg'$ , 1 tablet weekly (started 11/2018) - Appropriate, Effective, Safe, Accessible ?citracal/ vitamin D3 660/'25mg'$ , 2 tabs daily  (1000 units daily) - Appropriate, Effective, Safe, Accessible ?-Medications previously tried: none  ?-Recommend [id number] units of vitamin D daily. Recommend 1200 mg of calcium daily from dietary and supplemental sources. Counseled on oral  bisphosphonate administration: take in the morning, 30 minutes prior to food with 6-8 oz of water. Do not  lie down for at least 30 minutes after taking. Recommend weight-bearing and muscle strengthening exercises for building and maintaining bone density. ?-Counseled on diet and exercise extensively ?Recommended to continue current medication ? ?Insomnia (Goal: improve quality and quantity of sleep) ?-Uncontrolled ?-Current treatment  ?Ambien 5 mg 1 tablet at bedtime - Appropriate, Effective, Query Safe, Accessible ?-Medications previously tried: trazodone (ineffective), mirtazapine (ineffective), Ambien (tolerance), Belsomra (ineffective), melatonin (ineffective) ?-Counseled on practicing good sleep hygiene by setting a sleep schedule and maintaining it, avoid excessive napping, following a nightly routine, avoiding screen time for 30-60 minutes before going to bed, and making the bedroom a cool, quiet and dark space ? ?Allergic rhinitis (Goal: minimize symptoms) ?-Controlled ?-Current treatment  ?cetirizine '10mg'$ , 1 tablet daily as needed - Appropriate, Effective, Safe, Accessible ?fluticasone nasal spray (36mg/ actuation), 2 sprays into both nostrils daily - Appropriate, Effective, Safe, Accessible ?Mucinex '400mg'$ , 1 tablet twice daily - Appropriate, Effective, Safe, Accessible ?-Medications previously tried: Claritin ?-Recommended to continue current medication ? ?GERD (Goal: minimize symptoms) ?-Controlled ?-Current treatment  ?omeprazole 40 mg, 1 capsule daily (only when symptomatic) - Appropriate, Effective, Safe, Accessible ?-Medications previously tried: none  ?-Recommended to continue current medication ? ?Nerve pain (Goal: minimize pain) ?-Controlled ?-Current treatment  ?duloxetine 60 mg, 1 capsule once daily - Appropriate, Effective, Safe, Accessible ?duloxetine 30 mg, 1 capsule once daily - Appropriate, Effective, Safe, Accessible ?gabapentin 300 mg, 1 capsule in AM and 2 capsules at bedtime -  Appropriate, Effective, Safe, Accessible ?-Medications previously tried: none  ?-Recommended to continue current medication ? ?Osteoarthritis (Goal: minimize pain) ?-Uncontrolled ?-Current treatment  ?diclofenac gel 1%, uses twice for arthritis pain - Appropriate, Effective, Safe, Accessible ?Aleve 220 mg 1 tablets 2-3 nights per week - Appropriate, Effective, Query Safe, Accessible ?Tylenol 500 mg 3 tablets daily - Appropriate, Effective, Safe, Accessible ?-Medications previously tried: n/a ?-Recommended to continue current medication ?Recommended using more diclofenac gel instead of Aleve. ? ?Constipation (Goal: regular bowel movements) ?-Controlled ?-Current treatment  ?senna-docusate 8.6-'50mg'$ , 2 tablets twice daily - Appropriate, Effective, Safe, Accessible (only when needed)   ?Miralax 17 g a couple times a week - Appropriate, Effective, Safe, Accessible ?-Medications previously tried: none  ?-Recommended to continue current medication ?Counseled on drinking plenty of fluids and increasing fiber intake. ? ?Health Maintenance ?-Vaccine gaps: none ?-Current therapy:  ?No medications ?-Educated on Cost vs benefit of each product must be carefully weighed by individual consumer ?-Patient is satisfied with current therapy and denies issues ?-Recommended to continue as is ? ?Patient Goals/Self-Care Activities ?Patient will:  ?- take medications as prescribed ?check blood pressure weekly, document, and provide at future appointments ?target a minimum of 150 minutes of moderate intensity exercise weekly ? ?Follow Up Plan: Telephone follow up appointment with care management team member scheduled for:6 months ? ?  ?  ? ?Patient verbalizes understanding of instructions and care plan provided today and agrees to [place] in [place] [facility] [place] status and patient understanding of how to access instructions and care plan via MyChart confirmed with patient.    ?Telephone follow up appointment with pharmacy team member  scheduled for: ? ?MViona Gilmore RPH  ?

## 2021-10-25 ENCOUNTER — Other Ambulatory Visit: Payer: Self-pay | Admitting: Internal Medicine

## 2021-11-03 DIAGNOSIS — E785 Hyperlipidemia, unspecified: Secondary | ICD-10-CM | POA: Diagnosis not present

## 2021-11-03 DIAGNOSIS — I10 Essential (primary) hypertension: Secondary | ICD-10-CM | POA: Diagnosis not present

## 2021-11-03 DIAGNOSIS — M1990 Unspecified osteoarthritis, unspecified site: Secondary | ICD-10-CM

## 2021-11-03 DIAGNOSIS — M8580 Other specified disorders of bone density and structure, unspecified site: Secondary | ICD-10-CM

## 2021-11-04 DIAGNOSIS — C44319 Basal cell carcinoma of skin of other parts of face: Secondary | ICD-10-CM | POA: Diagnosis not present

## 2021-11-04 DIAGNOSIS — Z85828 Personal history of other malignant neoplasm of skin: Secondary | ICD-10-CM | POA: Diagnosis not present

## 2021-11-09 ENCOUNTER — Telehealth: Payer: Self-pay | Admitting: Internal Medicine

## 2021-11-09 NOTE — Telephone Encounter (Signed)
Patient called because she is hoping that her next refill on zolpidem (AMBIEN) 5 MG tablet can be for 90 days as [place] the [place]. Patient was made aware that Dr.Panosh is out of the office and will not be back in until Wednesday of next week   Please send to  [facility] # [address] [place] Phone:  [phone]  Fax:  [phone]          Please advise

## 2021-11-10 NOTE — Telephone Encounter (Signed)
Last refill per controlled substance database: 10/11/21 Last OV: 10/05/21 Next OV 04/11/22  Upon review of chart, pt noted to have 2 refills left of medication requested.  Attempted to call pt to inform her that she should request refill from pharmacy; pt is at lunch & states she will call back.  Please inform her that refills are available at her pharmacy.

## 2021-11-10 NOTE — Telephone Encounter (Signed)
Pt is aware refills at her pharm

## 2021-11-14 ENCOUNTER — Other Ambulatory Visit: Payer: Self-pay | Admitting: Internal Medicine

## 2021-11-19 ENCOUNTER — Telehealth: Payer: Self-pay | Admitting: Internal Medicine

## 2021-11-19 NOTE — Telephone Encounter (Signed)
Pt call and stated she want to know will dr.Panosh call her in a 90 day supply of zolpidem (AMBIEN) 5 MG tablet because is going to be at the beach September.

## 2021-11-22 MED ORDER — ZOLPIDEM TARTRATE 5 MG PO TABS: ORAL_TABLET | ORAL | 0 refills | Status: DC

## 2021-11-22 NOTE — Telephone Encounter (Signed)
Sent in electronically . 90 days  ambien 5  to [facility]

## 2021-11-22 NOTE — Telephone Encounter (Signed)
noted 

## 2021-11-22 NOTE — Telephone Encounter (Signed)
Last Ov 10/05/21 Filled 10/05/21 Is it ok to refill?

## 2021-11-22 NOTE — Addendum Note (Signed)
Addended byShanon Ace K on: 11/22/2021 11:17 AM   Modules accepted: Orders

## 2021-11-27 ENCOUNTER — Other Ambulatory Visit: Payer: Self-pay | Admitting: Internal Medicine

## 2021-12-20 IMAGING — CT CT RENAL STONE PROTOCOL
2 of 4 series · 16 of 46 positions shown, 18 images · non-contrast
Comparison: CT abdomen pelvis dated 05/12/2006.

CLINICAL DATA: 78-year-old female with flank pain. Concern for
kidney stone.

EXAM:
CT ABDOMEN AND PELVIS WITHOUT CONTRAST
TECHNIQUE: Multidetector CT imaging of the abdomen and pelvis was performed
following the standard protocol without IV contrast.

[Series 2: stone full standard · axial · 0.68mm/px · z∈[-422,-52]mm · 13 of 82 slices shown, 15 images]
[im 4/82  soft-tissue]
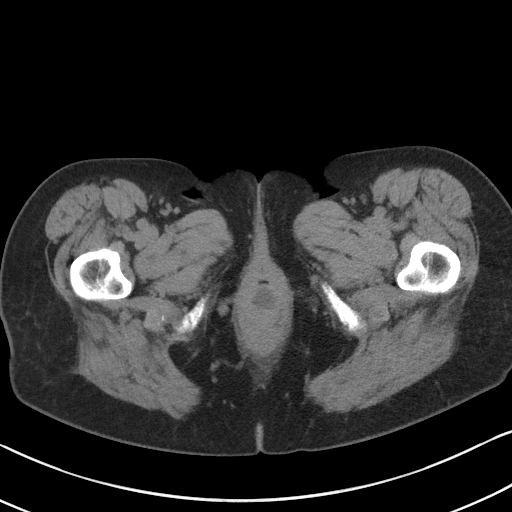
[im 4/82  bone]
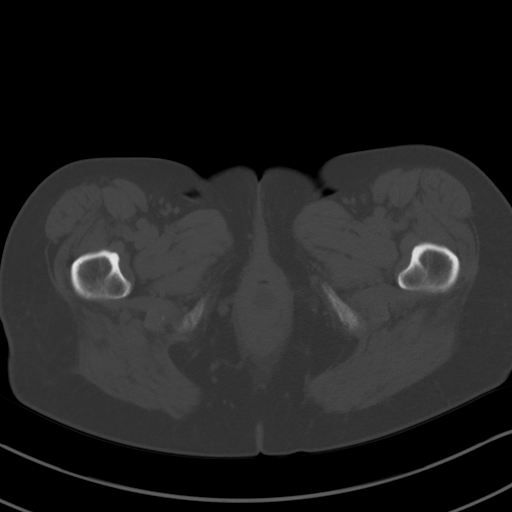
[im 12/82  soft-tissue]
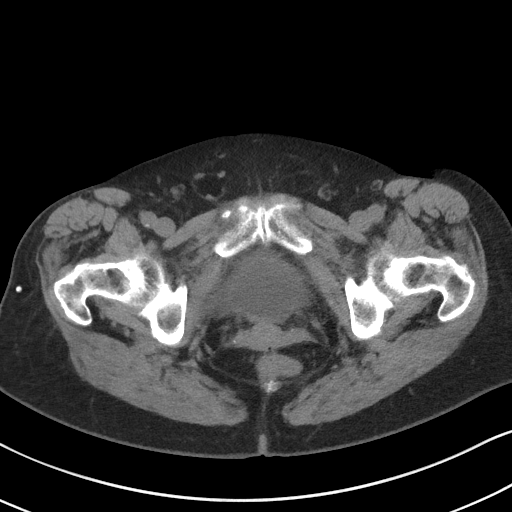
[im 19/82  soft-tissue]
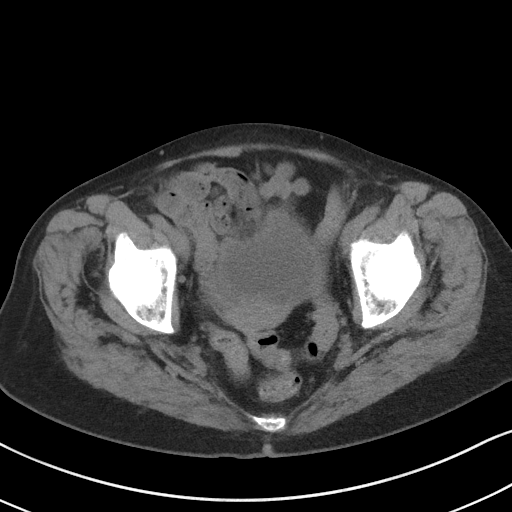
[im 23/82  soft-tissue]
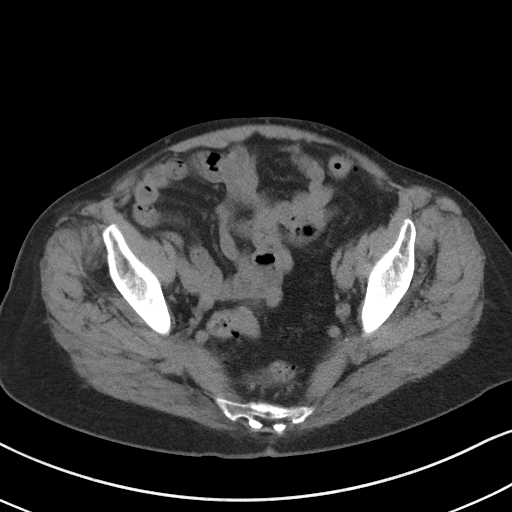
[im 30/82  soft-tissue]
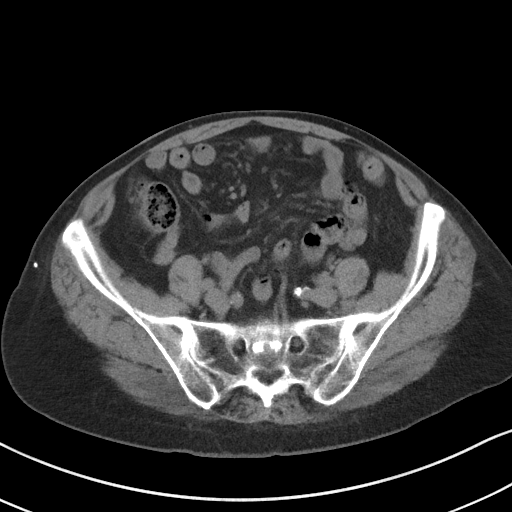
[im 34/82  soft-tissue]
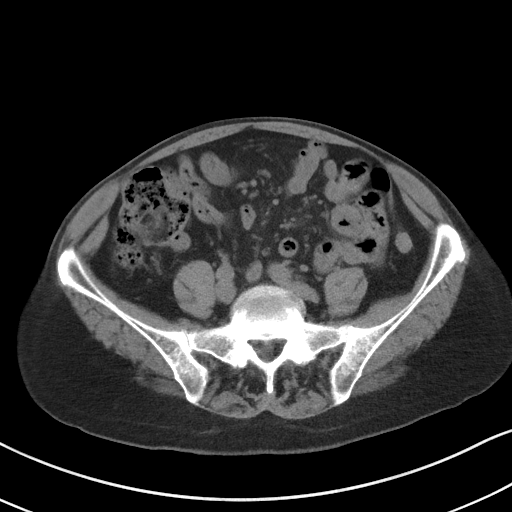
[im 41/82  soft-tissue]
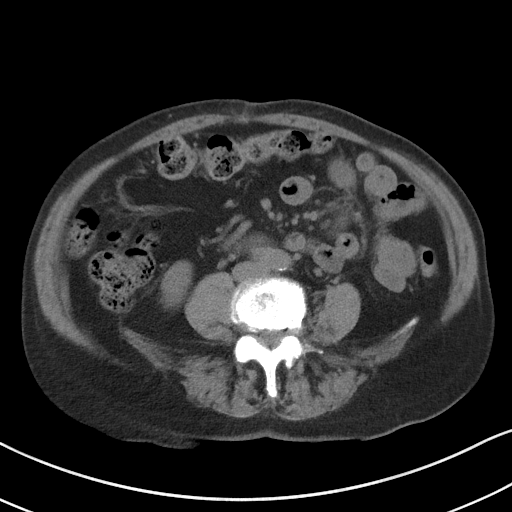
[im 48/82  soft-tissue]
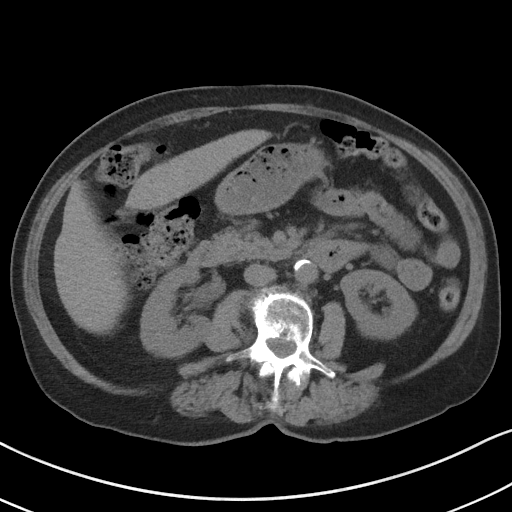
[im 52/82  soft-tissue]
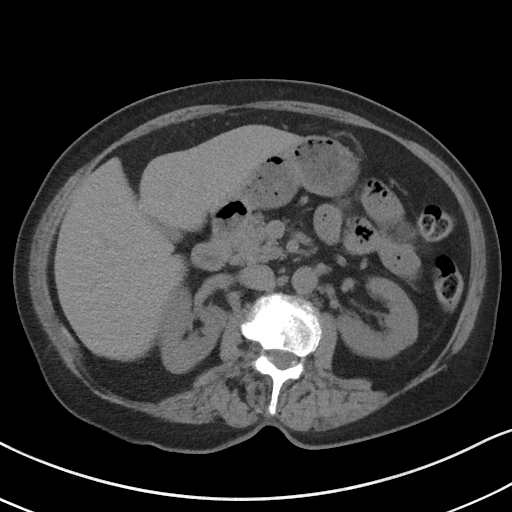
[im 52/82  bone]
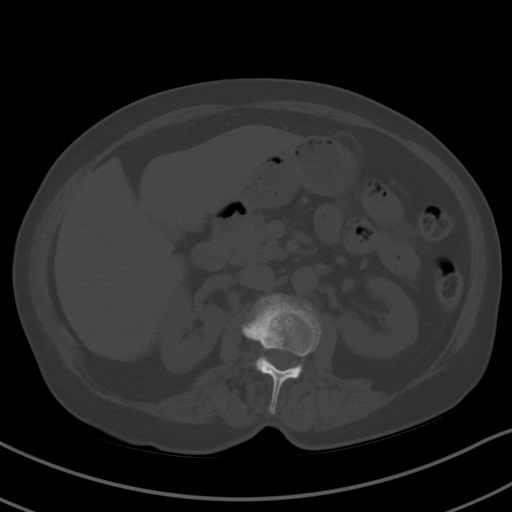
[im 59/82  soft-tissue]
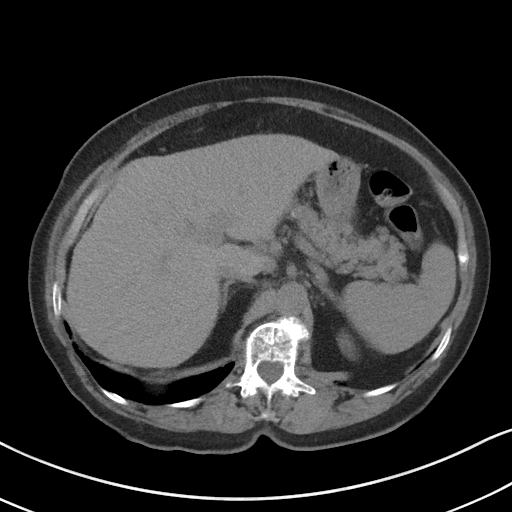
[im 63/82  soft-tissue]
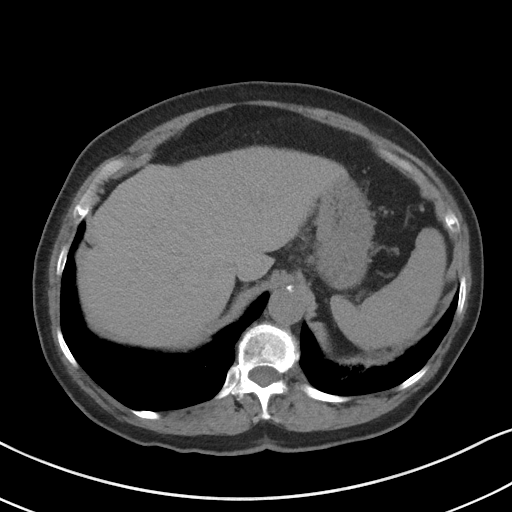
[im 70/82  soft-tissue]
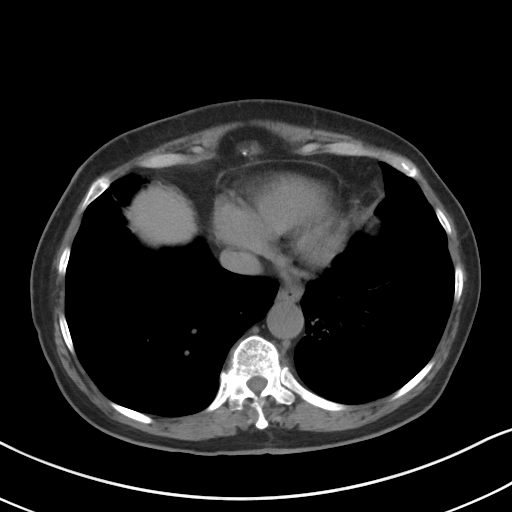
[im 78/82  soft-tissue]
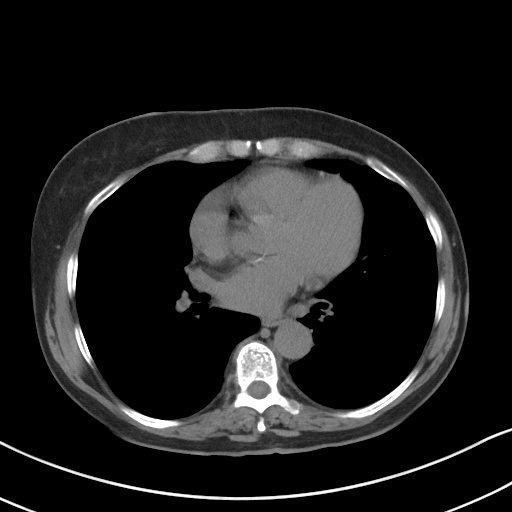

[Series 5: coronal · coronal · 0.75mm/px · 3 of 128 slices shown]
[im 43/128  soft-tissue]
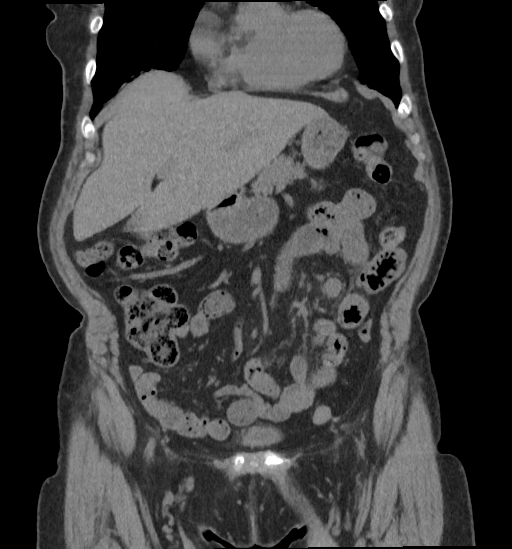
[im 57/128  soft-tissue]
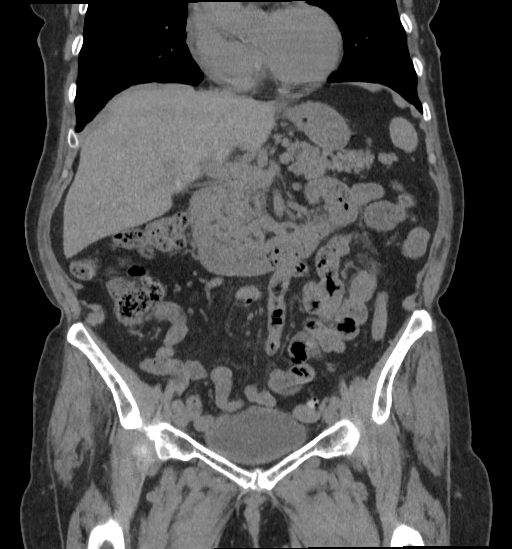
[im 71/128  soft-tissue]
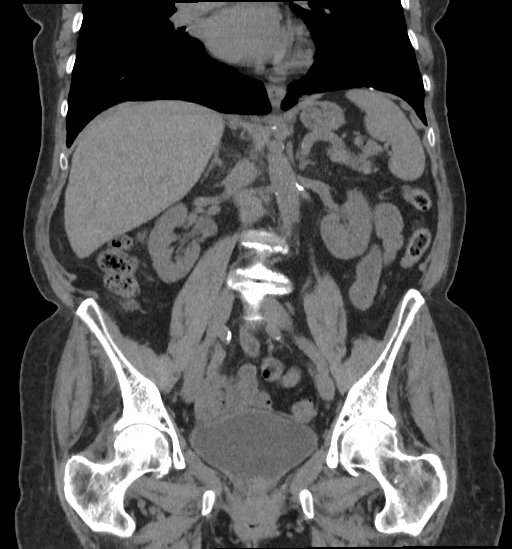

[16 of 46 positions shown; findings below may reference images not displayed]

FINDINGS: Evaluation of this exam is limited in the absence of intravenous
contrast.

Lower chest: There are bilateral lower lobe bronchiectatic changes,
left greater right. Bibasilar linear atelectasis/scarring and stable
nodular density versus bronchial mucous impaction similar to prior
CT.

Minimal faint subpleural density in the right lower lobe and lingula
may be chronic. Atypical infection is not excluded. Clinical
correlation is recommended.

No intra-abdominal free air or free fluid.

Hepatobiliary: The liver is unremarkable. The gallbladder is
predominantly contracted otherwise unremarkable.

Pancreas: Unremarkable. No pancreatic ductal dilatation or
surrounding inflammatory changes.

Spleen: Normal in size without focal abnormality.

Adrenals/Urinary Tract: The adrenal glands are unremarkable. There
is no hydronephrosis or nephrolithiasis on either side. The
visualized ureters and urinary bladder appear unremarkable. There is
a small cystocele.

Stomach/Bowel: There is moderate stool throughout the colon. There
are scattered sigmoid diverticula without active inflammatory
changes. There is no bowel obstruction or active inflammation. The
appendix is normal.

Vascular/Lymphatic: Advanced aortoiliac atherosclerotic disease. The
IVC is unremarkable. No portal venous gas. There is no adenopathy.

Reproductive: The uterus and ovaries are grossly unremarkable.

Other: Small fat containing umbilical hernia.

Musculoskeletal: Scoliosis and degenerative changes of the spine. No
acute osseous pathology.
IMPRESSION: 1. No acute intra-abdominopelvic pathology. No hydronephrosis or
nephrolithiasis.
2. Sigmoid diverticulosis. No bowel obstruction or active
inflammation. Normal appendix.
3.  Aortic Atherosclerosis (992OC-5D5.5).
4. Bibasilar bronchiectasis and scarring.

## 2021-12-30 ENCOUNTER — Other Ambulatory Visit: Payer: Self-pay | Admitting: Internal Medicine

## 2021-12-31 DIAGNOSIS — M25562 Pain in left knee: Secondary | ICD-10-CM | POA: Diagnosis not present

## 2022-01-06 ENCOUNTER — Other Ambulatory Visit: Payer: Self-pay | Admitting: Internal Medicine

## 2022-01-07 DIAGNOSIS — H52203 Unspecified astigmatism, bilateral: Secondary | ICD-10-CM | POA: Diagnosis not present

## 2022-01-07 DIAGNOSIS — H35363 Drusen (degenerative) of macula, bilateral: Secondary | ICD-10-CM | POA: Diagnosis not present

## 2022-01-07 DIAGNOSIS — Z961 Presence of intraocular lens: Secondary | ICD-10-CM | POA: Diagnosis not present

## 2022-01-11 DIAGNOSIS — L820 Inflamed seborrheic keratosis: Secondary | ICD-10-CM | POA: Diagnosis not present

## 2022-01-11 DIAGNOSIS — D485 Neoplasm of uncertain behavior of skin: Secondary | ICD-10-CM | POA: Diagnosis not present

## 2022-01-11 DIAGNOSIS — L821 Other seborrheic keratosis: Secondary | ICD-10-CM | POA: Diagnosis not present

## 2022-01-11 DIAGNOSIS — Z85828 Personal history of other malignant neoplasm of skin: Secondary | ICD-10-CM | POA: Diagnosis not present

## 2022-01-11 DIAGNOSIS — L578 Other skin changes due to chronic exposure to nonionizing radiation: Secondary | ICD-10-CM | POA: Diagnosis not present

## 2022-01-11 DIAGNOSIS — C44722 Squamous cell carcinoma of skin of right lower limb, including hip: Secondary | ICD-10-CM | POA: Diagnosis not present

## 2022-01-15 DIAGNOSIS — R300 Dysuria: Secondary | ICD-10-CM | POA: Diagnosis not present

## 2022-01-15 DIAGNOSIS — A499 Bacterial infection, unspecified: Secondary | ICD-10-CM | POA: Diagnosis not present

## 2022-01-15 DIAGNOSIS — R310 Gross hematuria: Secondary | ICD-10-CM | POA: Diagnosis not present

## 2022-01-15 DIAGNOSIS — N390 Urinary tract infection, site not specified: Secondary | ICD-10-CM | POA: Diagnosis not present

## 2022-01-25 ENCOUNTER — Telehealth: Payer: Self-pay | Admitting: Pharmacist

## 2022-01-25 NOTE — Chronic Care Management (AMB) (Signed)
Chronic Care Management Pharmacy Assistant   Name: Julie Bonilla  MRN: [id number] DOB: [birth year]  Reason for Encounter: Disease State   Conditions to be addressed/monitored: HTN  Recent office visits:  None  Recent consult visits:  12/31/21 Aluisio, Pilar Plate Aluisio ([facility]) - Patient presented for History of left total knee replacement and other concerns. Recommended Voltaren Gel.  11/04/21 Griselda Miner (Dermatology) - Claims encounter for Basal cell carcinoma of skin and other parts of face and other concerns. No other visit details available.  Hospital visits:  None in previous 6 months  Medications: Outpatient Encounter Medications as of 01/25/2022  Medication Sig   amLODipine (NORVASC) 5 MG tablet TAKE ONE TABLET BY MOUTH ONE TIME DAILY   B Complex-C (SUPER B COMPLEX PO) Take 1 tablet by mouth daily.   Calcium Carb-Cholecalciferol (CALCIUM + D3 PO) Take 1 tablet by mouth in the morning and at bedtime. Calcium Citrate   Coenzyme Q10 (CO Q 10 PO) Take 1 tablet by mouth at bedtime.   DULoxetine (CYMBALTA) 30 MG capsule TAKE ONE CAPSULE BY MOUTH ONE TIME DAILY along with '60mg'$    DULoxetine (CYMBALTA) 60 MG capsule TAKE ONE CAPSULE BY MOUTH TWICE DAILY (PT NEEDS APPT FOR MORE REFILLS)   ezetimibe (ZETIA) 10 MG tablet TAKE ONE TABLET BY MOUTH ONE TIME DAILY   gabapentin (NEURONTIN) 300 MG capsule TAKE ONE CAPSULE BY MOUTH IN THE MORNING AND TWO AT BEDTIME OR AS DIRECTED   losartan (COZAAR) 100 MG tablet Take 1 tablet (100 mg total) by mouth daily.   Magnesium Oxide (MAG-200) 200 MG TABS Take 200 mg by mouth daily in the afternoon.   Multiple Vitamin (MULTIVITAMIN WITH MINERALS) TABS tablet Take 1 tablet by mouth daily. One-A-Day   omeprazole (PRILOSEC) 20 MG capsule Take 20 mg by mouth daily as needed (acid indigestion/heartburn/GERD).   Polyethyl Glycol-Propyl Glycol (LUBRICANT EYE DROPS) 0.4-0.3 % SOLN Place 1 drop into both eyes 3 (three) times daily as needed  (dry/irritated eyes.).   psyllium (METAMUCIL SMOOTH TEXTURE) 28 % packet Take 1 packet by mouth daily as needed (constipation).   risedronate (ACTONEL) 35 MG tablet Take 1 tablet by mouth once a week with water on an empty stomach**don't eat or drink or lie down for the next 30 minutes**   sodium chloride (OCEAN) 0.65 % SOLN nasal spray Place 1 spray into both nostrils as needed (dry nose and mouth).   tiZANidine (ZANAFLEX) 2 MG tablet Take 1 tablet (2 mg total) by mouth at bedtime as needed for muscle spasms.   zolpidem (AMBIEN) 5 MG tablet take 1 tablet by mouth daily at bedtime as needed for sleep   No facility-administered encounter medications on file as of 01/25/2022.    Reviewed chart prior to disease state call. Spoke with patient regarding BP  Recent Office Vitals: BP Readings from Last 3 Encounters:  10/05/21 135/70  08/13/21 (!) 175/85  08/06/21 118/70   Pulse Readings from Last 3 Encounters:  10/05/21 78  08/13/21 79  08/06/21 72    Wt Readings from Last 3 Encounters:  10/05/21 129 lb 3.2 oz (58.6 kg)  08/13/21 129 lb (58.5 kg)  08/06/21 131 lb (59.4 kg)     Kidney Function Lab Results  Component Value Date/Time   CREATININE 0.72 06/01/2021 01:00 PM   CREATININE 0.76 10/13/2020 03:41 AM   CREATININE 0.84 12/30/2019 10:03 AM   GFR 76.55 09/22/2020 04:13 PM   GFRNONAA >60 06/01/2021 01:00 PM   GFRAA >60 07/30/2019  03:56 AM       Latest Ref Rng & Units 06/01/2021    1:00 PM 10/13/2020    3:41 AM 10/08/2020   11:21 AM  BMP  Glucose 70 - 99 mg/dL 115  136  107   BUN 8 - 23 mg/dL '18  14  20   '$ Creatinine 0.44 - 1.00 mg/dL 0.72  0.76  0.68   Sodium 135 - 145 mmol/L 137  138  139   Potassium 3.5 - 5.1 mmol/L 4.3  4.6  3.9   Chloride 98 - 111 mmol/L 105  108  106   CO2 22 - 32 mmol/L '25  24  25   '$ Calcium 8.9 - 10.3 mg/dL 8.8  8.1  8.8     Current antihypertensive regimen:  Losartan 100 mg 1 tablet daily - Appropriate, Effective, Safe, Accessible Amlodipine 5 mg  1 tablet daily - Appropriate, Effective, Safe, Accessible  Unable to reach  Care Gaps: COVID Booster - Overdue Flu Vaccine - Overdue Colonoscopy - Postponed CCM- 12/23 BP- 135/70 5/23 AWV- 11/22   Star Rating Drugs: Losartan 100 mg - Last filled 11/16/21 90 DS at [place] Pharmacist Assistant [phone]

## 2022-02-01 ENCOUNTER — Other Ambulatory Visit: Payer: Self-pay | Admitting: Internal Medicine

## 2022-02-03 DIAGNOSIS — L570 Actinic keratosis: Secondary | ICD-10-CM | POA: Diagnosis not present

## 2022-02-03 DIAGNOSIS — Z85828 Personal history of other malignant neoplasm of skin: Secondary | ICD-10-CM | POA: Diagnosis not present

## 2022-02-03 DIAGNOSIS — C44722 Squamous cell carcinoma of skin of right lower limb, including hip: Secondary | ICD-10-CM | POA: Diagnosis not present

## 2022-02-16 ENCOUNTER — Telehealth: Payer: Self-pay | Admitting: Internal Medicine

## 2022-02-16 NOTE — Telephone Encounter (Signed)
Pt is calling and left her amLODipine (NORVASC) 5 MG tablet at beach. Pt will be going back to beach in 2 wks and would like #14 amLODipine (NORVASC) 5 MG tablet send to [facility] # [address] [place] Phone:  [phone]  Fax:  [phone]

## 2022-02-23 ENCOUNTER — Other Ambulatory Visit: Payer: Self-pay

## 2022-02-23 MED ORDER — AMLODIPINE BESYLATE 5 MG PO TABS: 5.0000 mg | ORAL_TABLET | Freq: Every day | ORAL | 0 refills | Status: DC

## 2022-02-23 NOTE — Telephone Encounter (Signed)
Patient calling to check progress of this refill. She is out,  [facility] # [place], [place] Phone:  [phone]  Fax:  [phone]

## 2022-02-23 NOTE — Telephone Encounter (Signed)
Rx sent. Patient is made aware.

## 2022-03-07 ENCOUNTER — Other Ambulatory Visit: Payer: Self-pay | Admitting: Internal Medicine

## 2022-03-09 ENCOUNTER — Telehealth: Payer: Self-pay | Admitting: Pharmacist

## 2022-03-09 NOTE — Chronic Care Management (AMB) (Signed)
Chronic Care Management Pharmacy Assistant   Name: Julie Bonilla  MRN: [id number] DOB: 1940-10-22  Reason for Encounter: Disease State   Conditions to be addressed/monitored: HTN  Recent office visits:  None   Recent consult visits:  12/31/21 Aluisio, Pilar Plate Aluisio ([facility]) - Patient presented for History of left total knee replacement and other concerns. Recommended Voltaren Gel.   11/04/21 Griselda Miner (Dermatology) - Claims encounter for Basal cell carcinoma of skin and other parts of face and other concerns. No other visit details available.   Hospital visits:  None in previous 6 months  Medications: Outpatient Encounter Medications as of 03/09/2022  Medication Sig   amLODipine (NORVASC) 5 MG tablet Take 1 tablet (5 mg total) by mouth daily.   B Complex-C (SUPER B COMPLEX PO) Take 1 tablet by mouth daily.   Calcium Carb-Cholecalciferol (CALCIUM + D3 PO) Take 1 tablet by mouth in the morning and at bedtime. Calcium Citrate   Coenzyme Q10 (CO Q 10 PO) Take 1 tablet by mouth at bedtime.   DULoxetine (CYMBALTA) 30 MG capsule TAKE ONE CAPSULE BY MOUTH ONE TIME DAILY along with '60mg'$    DULoxetine (CYMBALTA) 60 MG capsule TAKE ONE CAPSULE BY MOUTH TWICE DAILY (PT NEEDS APPT FOR MORE REFILLS)   ezetimibe (ZETIA) 10 MG tablet TAKE ONE TABLET BY MOUTH ONE TIME DAILY   gabapentin (NEURONTIN) 300 MG capsule TAKE ONE CAPSULE BY MOUTH IN THE MORNING AND TWO AT BEDTIME OR AS DIRECTED   losartan (COZAAR) 100 MG tablet Take 1 tablet (100 mg total) by mouth daily.   Magnesium Oxide (MAG-200) 200 MG TABS Take 200 mg by mouth daily in the afternoon.   Multiple Vitamin (MULTIVITAMIN WITH MINERALS) TABS tablet Take 1 tablet by mouth daily. One-A-Day   omeprazole (PRILOSEC) 20 MG capsule Take 20 mg by mouth daily as needed (acid indigestion/heartburn/GERD).   Polyethyl Glycol-Propyl Glycol (LUBRICANT EYE DROPS) 0.4-0.3 % SOLN Place 1 drop into both eyes 3 (three) times daily as needed  (dry/irritated eyes.).   psyllium (METAMUCIL SMOOTH TEXTURE) 28 % packet Take 1 packet by mouth daily as needed (constipation).   risedronate (ACTONEL) 35 MG tablet Take 1 tablet by mouth once a week with water on an empty stomach**don't eat or drink or lie down for the next 30 minutes**   sodium chloride (OCEAN) 0.65 % SOLN nasal spray Place 1 spray into both nostrils as needed (dry nose and mouth).   tiZANidine (ZANAFLEX) 2 MG tablet Take 1 tablet (2 mg total) by mouth at bedtime as needed for muscle spasms.   zolpidem (AMBIEN) 5 MG tablet take 1 tablet by mouth daily at bedtime as needed for sleep   No facility-administered encounter medications on file as of 03/09/2022.  Reviewed chart prior to disease state call. Spoke with patient regarding BP  Recent Office Vitals: BP Readings from Last 3 Encounters:  10/05/21 135/70  08/13/21 (!) 175/85  08/06/21 118/70   Pulse Readings from Last 3 Encounters:  10/05/21 78  08/13/21 79  08/06/21 72    Wt Readings from Last 3 Encounters:  10/05/21 129 lb 3.2 oz (58.6 kg)  08/13/21 129 lb (58.5 kg)  08/06/21 131 lb (59.4 kg)     Kidney Function Lab Results  Component Value Date/Time   CREATININE 0.72 06/01/2021 01:00 PM   CREATININE 0.76 10/13/2020 03:41 AM   CREATININE 0.84 12/30/2019 10:03 AM   GFR 76.55 09/22/2020 04:13 PM   GFRNONAA >60 06/01/2021 01:00 PM   GFRAA >  60 07/30/2019 03:56 AM       Latest Ref Rng & Units 06/01/2021    1:00 PM 10/13/2020    3:41 AM 10/08/2020   11:21 AM  BMP  Glucose 70 - 99 mg/dL 115  136  107   BUN 8 - 23 mg/dL '18  14  20   '$ Creatinine 0.44 - 1.00 mg/dL 0.72  0.76  0.68   Sodium 135 - 145 mmol/L 137  138  139   Potassium 3.5 - 5.1 mmol/L 4.3  4.6  3.9   Chloride 98 - 111 mmol/L 105  108  106   CO2 22 - 32 mmol/L '25  24  25   '$ Calcium 8.9 - 10.3 mg/dL 8.8  8.1  8.8     Current antihypertensive regimen:  Losartan 100 mg 1 tablet daily - Appropriate, Effective, Safe, Accessible Amlodipine 5 mg 1  tablet daily - Appropriate, Effective, Safe, Accessible How often are you checking your Blood Pressure? weekly Current home BP readings: 135/70 Patient denies any hyper/hypotensive symptoms What recent interventions/DTPs have been made by any provider to improve Blood Pressure control since last CPP Visit: Patient reports none Any recent hospitalizations or ED visits since last visit with CPP? No Patient reports she went on last wed to get her flu vaccine at [facility] and inquired on the COVID and RSV vaccines she stated [facility] did not have either available, informed her that unfortunately the office is not offering them and she may try calling another pharmacy such as [facility]/ [facility] if she is interested in getting them she was in agreement.  Adherence Review: Is the patient currently on ACE/ARB medication? Yes Does the patient have >5 day gap between last estimated fill dates? No  Care Gaps: COVID Booster - Overdue Flu Vaccine - Overdue Done @ [facility] on 9/27 Colonoscopy - Postponed CCM- 12/23 BP- 135/70 5/23 AWV- 11/22  Star Rating Drugs: Losartan 100 mg - Last filled 02/12/22 90 DS at [employer] Pharmacist Assistant [phone]

## 2022-03-18 DIAGNOSIS — I10 Essential (primary) hypertension: Secondary | ICD-10-CM | POA: Diagnosis not present

## 2022-03-18 DIAGNOSIS — R300 Dysuria: Secondary | ICD-10-CM | POA: Diagnosis not present

## 2022-03-18 DIAGNOSIS — R350 Frequency of micturition: Secondary | ICD-10-CM | POA: Diagnosis not present

## 2022-03-18 DIAGNOSIS — N390 Urinary tract infection, site not specified: Secondary | ICD-10-CM | POA: Diagnosis not present

## 2022-03-24 DIAGNOSIS — G8929 Other chronic pain: Secondary | ICD-10-CM | POA: Diagnosis not present

## 2022-03-24 DIAGNOSIS — E785 Hyperlipidemia, unspecified: Secondary | ICD-10-CM | POA: Diagnosis not present

## 2022-03-24 DIAGNOSIS — E663 Overweight: Secondary | ICD-10-CM | POA: Diagnosis not present

## 2022-03-24 DIAGNOSIS — G629 Polyneuropathy, unspecified: Secondary | ICD-10-CM | POA: Diagnosis not present

## 2022-03-24 DIAGNOSIS — R32 Unspecified urinary incontinence: Secondary | ICD-10-CM | POA: Diagnosis not present

## 2022-03-24 DIAGNOSIS — F329 Major depressive disorder, single episode, unspecified: Secondary | ICD-10-CM | POA: Diagnosis not present

## 2022-03-24 DIAGNOSIS — G4700 Insomnia, unspecified: Secondary | ICD-10-CM | POA: Diagnosis not present

## 2022-03-24 DIAGNOSIS — I10 Essential (primary) hypertension: Secondary | ICD-10-CM | POA: Diagnosis not present

## 2022-03-24 DIAGNOSIS — R350 Frequency of micturition: Secondary | ICD-10-CM | POA: Diagnosis not present

## 2022-03-24 DIAGNOSIS — N390 Urinary tract infection, site not specified: Secondary | ICD-10-CM | POA: Diagnosis not present

## 2022-04-06 ENCOUNTER — Other Ambulatory Visit: Payer: Self-pay | Admitting: Internal Medicine

## 2022-04-11 ENCOUNTER — Encounter: Payer: Self-pay | Admitting: Internal Medicine

## 2022-04-11 ENCOUNTER — Ambulatory Visit (INDEPENDENT_AMBULATORY_CARE_PROVIDER_SITE_OTHER): Payer: PPO | Admitting: Internal Medicine

## 2022-04-11 ENCOUNTER — Other Ambulatory Visit: Payer: Self-pay | Admitting: Internal Medicine

## 2022-04-11 VITALS — BP 140/82 | HR 75 | Temp 97.5°F | Ht 59.25 in | Wt 127.0 lb

## 2022-04-11 DIAGNOSIS — Z9889 Other specified postprocedural states: Secondary | ICD-10-CM | POA: Diagnosis not present

## 2022-04-11 DIAGNOSIS — G4700 Insomnia, unspecified: Secondary | ICD-10-CM

## 2022-04-11 DIAGNOSIS — G609 Hereditary and idiopathic neuropathy, unspecified: Secondary | ICD-10-CM

## 2022-04-11 DIAGNOSIS — E785 Hyperlipidemia, unspecified: Secondary | ICD-10-CM

## 2022-04-11 DIAGNOSIS — Z8709 Personal history of other diseases of the respiratory system: Secondary | ICD-10-CM

## 2022-04-11 DIAGNOSIS — Z79899 Other long term (current) drug therapy: Secondary | ICD-10-CM | POA: Diagnosis not present

## 2022-04-11 DIAGNOSIS — Z0000 Encounter for general adult medical examination without abnormal findings: Secondary | ICD-10-CM | POA: Diagnosis not present

## 2022-04-11 DIAGNOSIS — I10 Essential (primary) hypertension: Secondary | ICD-10-CM

## 2022-04-11 DIAGNOSIS — Z6379 Other stressful life events affecting family and household: Secondary | ICD-10-CM

## 2022-04-11 DIAGNOSIS — J479 Bronchiectasis, uncomplicated: Secondary | ICD-10-CM

## 2022-04-11 DIAGNOSIS — M170 Bilateral primary osteoarthritis of knee: Secondary | ICD-10-CM

## 2022-04-11 NOTE — Patient Instructions (Addendum)
Good to see y ou today  Exam is stable. Make fasting lab appt for December .  And go from there . 6 mos fu
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
## 2022-04-18 ENCOUNTER — Ambulatory Visit (INDEPENDENT_AMBULATORY_CARE_PROVIDER_SITE_OTHER): Payer: PPO | Admitting: Family Medicine

## 2022-04-18 ENCOUNTER — Ambulatory Visit (INDEPENDENT_AMBULATORY_CARE_PROVIDER_SITE_OTHER): Payer: PPO

## 2022-04-18 ENCOUNTER — Encounter: Payer: Self-pay | Admitting: Family Medicine

## 2022-04-18 VITALS — BP 112/78 | HR 88 | Temp 98.7°F | Ht 59.25 in | Wt 129.3 lb

## 2022-04-18 DIAGNOSIS — R059 Cough, unspecified: Secondary | ICD-10-CM | POA: Diagnosis not present

## 2022-04-18 DIAGNOSIS — J189 Pneumonia, unspecified organism: Secondary | ICD-10-CM

## 2022-04-18 DIAGNOSIS — R079 Chest pain, unspecified: Secondary | ICD-10-CM | POA: Diagnosis not present

## 2022-04-18 DIAGNOSIS — J479 Bronchiectasis, uncomplicated: Secondary | ICD-10-CM | POA: Diagnosis not present

## 2022-04-18 MED ORDER — BENZONATATE 100 MG PO CAPS: 100.0000 mg | ORAL_CAPSULE | Freq: Two times a day (BID) | ORAL | 0 refills | Status: DC | PRN

## 2022-04-18 MED ORDER — LEVOFLOXACIN 500 MG PO TABS: 500.0000 mg | ORAL_TABLET | Freq: Every day | ORAL | 0 refills | Status: AC

## 2022-04-18 NOTE — Progress Notes (Signed)
Established Patient Office Visit  Subjective   Patient ID: Julie Bonilla, female    DOB: [birth year]  Age: 81 y.o. MRN: [id number]  Chief Complaint  Patient presents with   Cough    Productive with Perri sputum x1 day    Pt was visiting family over the weekend, states that no one else was ill at the time. States that when she got back into town yesterday she started feeling very poorly. No body aches. COVID test at home was negative. Pt reports that she did have the flu shot this year, has not had the booster or the RSV yet. She is reporting left sided chest pain under her arm when she coughs. Reports she has had pneumonia several times in the past, has a history of bronchiectasis, last CT was in 2017 and it was located on the left lower side.   Cough This is a new problem. The current episode started yesterday. The cough is Productive of sputum. Associated symptoms include chest pain (left axillary side of chest) and shortness of breath. Pertinent negatives include no chills or fever. Her past medical history is significant for bronchiectasis.    Patient Active Problem List   Diagnosis Date Noted   Primary osteoarthritis of left knee 10/12/2020   OA (osteoarthritis) of knee 07/29/2019   S/P total knee arthroplasty, right 07/29/2019   Osteoporosis without current pathological fracture 06/19/2018   Allergic rhinitis 12/27/2016   Chronic sinusitis 12/23/2016   Memory loss 12/04/2016   Attention deficit 12/04/2016   Hx of bacterial pneumonia 02/23/2015   Hx of adenomatous colonic polyps 11/28/2014   Muscular deconditioning 11/06/2014   Body aches 04/02/2014   Hoarseness 04/02/2014   Hyperlipidemia 04/02/2014   Urinary urgency 04/02/2014   Cough, persistent 04/02/2014   Medication management 01/21/2014   Internal nasal lesion 12/26/2013   Right nasal polyps  ?  12/26/2013   Visit for preventive health examination 07/15/2013   Essential hypertension 07/15/2013   Acute medial  meniscal tear 01/29/2013   Knee pain 01/28/2013   GERD (gastroesophageal reflux disease) 04/05/2012   Episode of generalized weakness 01/12/2012   Otalgia of both ears 01/12/2012   Tremor intermittent intention 01/12/2012   Chronic throat clearing 09/25/2011   Hearing loss 03/05/2010   ACQUIRED MUSCULOSKELETAL DEFORMITY OTH SPEC SITE 06/19/2009   SKIN CANCER, HX OF 11/03/2008   BACK PAIN, LUMBAR 06/27/2007   IRON DEFICIENCY 03/27/2007   BACK PAIN, THORACIC REGION 03/27/2007   OSTEOPENIA 12/17/2006   HYPERLIPIDEMIA 10/24/2006   DEPRESSION 10/24/2006   ADHD 10/24/2006   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  Respiratory:  Positive for cough and shortness of breath.   Cardiovascular:  Positive for chest pain (left axillary side of chest).      Objective:     BP 112/78 (BP Location: Left Arm, Patient Position: Sitting, Cuff Size: Normal)   Pulse 88   Temp 98.7 F (37.1 C) (Oral)   Ht 4' 11.25" (1.505 m)   Wt 129 lb 4.8 oz (58.7 kg)   SpO2 98%   BMI 25.90 kg/m    Physical Exam Constitutional:      Appearance: Normal appearance. She is normal weight.  HENT:     Right Ear: Tympanic membrane and ear canal normal.     Left Ear: Tympanic membrane and ear canal normal.     Mouth/Throat:     Mouth: Mucous membranes are moist.     Pharynx: Oropharynx is clear. Posterior oropharyngeal erythema present.  Eyes:     Conjunctiva/sclera: Conjunctivae normal.  Cardiovascular:     Rate and Rhythm: Normal rate and regular rhythm.     Heart sounds: Murmur heard.  Pulmonary:     Effort: Pulmonary effort is normal.     Breath sounds: Wheezing and rhonchi (left  posterior lung) present.  Abdominal:     General: Bowel sounds are normal.  Neurological:     Mental Status: She is alert.      No results found for any visits on 04/18/22.    The ASCVD Risk score (Arnett DK, et al., 2019) failed to calculate for the following reasons:   The 2019 ASCVD risk score is only valid for ages 30 to 2    Assessment & Plan:   Problem List Items Addressed This Visit   None Visit Diagnoses     Community acquired pneumonia of left lower lobe of lung    -  Primary   Relevant Medications   Possibly, her physical exam shows some low pitched wheezing/ rhonchi in the left posterior lung field, with her history of cough without any upper respiratory nasal congestion and her history of bronchiectasis, I would treat empirically with levaquin 500 mg daily and will obtain a CXR to look at the left lower lobe.   levofloxacin (LEVAQUIN) 500 MG tablet   benzonatate (TESSALON) 100 MG capsule   Other Relevant Orders   DG Chest 2 View       No follow-ups on file.    Farrel Conners, MD

## 2022-04-20 NOTE — Progress Notes (Signed)
Positive for pneumonia in the left lower lobe, please have patient complete the antibiotics I gave her. If she has any trouble with her recovery please have her call us back.

## 2022-04-21 ENCOUNTER — Ambulatory Visit (INDEPENDENT_AMBULATORY_CARE_PROVIDER_SITE_OTHER): Payer: PPO

## 2022-04-21 VITALS — Ht 59.25 in | Wt 129.0 lb

## 2022-04-21 DIAGNOSIS — Z1211 Encounter for screening for malignant neoplasm of colon: Secondary | ICD-10-CM | POA: Diagnosis not present

## 2022-04-21 DIAGNOSIS — Z0000 Encounter for general adult medical examination without abnormal findings: Secondary | ICD-10-CM

## 2022-04-21 NOTE — Patient Instructions (Addendum)
Julie Bonilla , Thank you for taking time to come for your Medicare Wellness Visit. I appreciate your ongoing commitment to your health goals. Please review the following plan we discussed and let me know if I can assist you in the future.   These are the goals we discussed:  Goals       Patient Stated      Get her diet back in line with your norm.      Patient stated (pt-stated)      I would like to work in my flowers.      Weight (lb) < 118 lb (53.5 kg)      Lose 4 to 5 lbs;  Lost 2 to 3 lbs;   Fat free or low fat dairy products Fish high in omega-3 acids ( salmon, tuna, trout) Fruits, such as apples, bananas, oranges, pears, prunes Legumes, such as kidney beans, lentils, checkpeas, black-eyed peas and lima beans Vegetables; broccoli, cabbage, carrots Whole grains;   Plant fats are better; decrease "white" foods as pasta, rice, bread and desserts, sugar; Avoid red meat (limiting) palm and coconut oils; sugary foods and beverages  Two nutrients that raise blood chol levels are saturated fats and trans fat; in hydrogenated oils and fats, as stick margarine, baked goods (cookes, cakes, pies, crackers; frosting; and coffee creamers;   Some Fats lower cholesterol: Monounsaturated and polyunsaturated  Avocados Corn, sunflower, and soybean oils Nuts and seeds, such as walnuts Olive, canola, peanut, safflower, and sesame oils Peanut butter Salmon and trout Tofu.             This is a list of the screening recommended for you and due dates:  Health Maintenance  Topic Date Due   COVID-19 Vaccine (6 - Mixed Product risk series) 05/04/2022*   Medicare Annual Wellness Visit  04/22/2023   Tetanus Vaccine  08/07/2028   Pneumonia Vaccine  Completed   Flu Shot  Completed   DEXA scan (bone density measurement)  Completed   Zoster (Shingles) Vaccine  Completed   HPV Vaccine  Aged Out   Colon Cancer Screening  Discontinued  *Topic was postponed. The date shown is not the original due  date.    Advanced directives: In Chart  Conditions/risks identified: None  Next appointment: Follow up in one year for your annual wellness visit    Preventive Care 65 Years and Older, Female Preventive care refers to lifestyle choices and visits with your health care provider that can promote health and wellness. What does preventive care include? A yearly physical exam. This is also called an annual well check. Dental exams once or twice a year. Routine eye exams. Ask your health care provider how often you should have your eyes checked. Personal lifestyle choices, including: Daily care of your teeth and gums. Regular physical activity. Eating a healthy diet. Avoiding tobacco and drug use. Limiting alcohol use. Practicing safe sex. Taking low-dose aspirin every day. Taking vitamin and mineral supplements as recommended by your health care provider. What happens during an annual well check? The services and screenings done by your health care provider during your annual well check will depend on your age, overall health, lifestyle risk factors, and family history of disease. Counseling  Your health care provider may ask you questions about your: Alcohol use. Tobacco use. Drug use. Emotional well-being. Home and relationship well-being. Sexual activity. Eating habits. History of falls. Memory and ability to understand (cognition). Work and work Statistician. Reproductive health. Screening  You may have  the following tests or measurements: Height, weight, and BMI. Blood pressure. Lipid and cholesterol levels. These may be checked every 5 years, or more frequently if you are over 31 years old. Skin check. Lung cancer screening. You may have this screening every year starting at age 4 if you have a 30-pack-year history of smoking and currently smoke or have quit within the past 15 years. Fecal occult blood test (FOBT) of the stool. You may have this test every year  starting at age 9. Flexible sigmoidoscopy or colonoscopy. You may have a sigmoidoscopy every 5 years or a colonoscopy every 10 years starting at age 34. Hepatitis C blood test. Hepatitis B blood test. Sexually transmitted disease (STD) testing. Diabetes screening. This is done by checking your blood sugar (glucose) after you have not eaten for a while (fasting). You may have this done every 1-3 years. Bone density scan. This is done to screen for osteoporosis. You may have this done starting at age 42. Mammogram. This may be done every 1-2 years. Talk to your health care provider about how often you should have regular mammograms. Talk with your health care provider about your test results, treatment options, and if necessary, the need for more tests. Vaccines  Your health care provider may recommend certain vaccines, such as: Influenza vaccine. This is recommended every year. Tetanus, diphtheria, and acellular pertussis (Tdap, Td) vaccine. You may need a Td booster every 10 years. Zoster vaccine. You may need this after age 66. Pneumococcal 13-valent conjugate (PCV13) vaccine. One dose is recommended after age 25. Pneumococcal polysaccharide (PPSV23) vaccine. One dose is recommended after age 57. Talk to your health care provider about which screenings and vaccines you need and how often you need them. This information is not intended to replace advice given to you by your health care provider. Make sure you discuss any questions you have with your health care provider. Document Released: 06/19/2015 Document Revised: 02/10/2016 Document Reviewed: 03/24/2015 Elsevier Interactive Patient Education  2017 [place] Prevention in the Home Falls can cause injuries. They can happen to people of all ages. There are many things you can do to make your home safe and to help prevent falls. What can I do on the outside of my home? Regularly fix the edges of walkways and driveways and fix any  cracks. Remove anything that might make you trip as you walk through a door, such as a raised step or threshold. Trim any bushes or trees on the path to your home. Use bright outdoor lighting. Clear any walking paths of anything that might make someone trip, such as rocks or tools. Regularly check to see if handrails are loose or broken. Make sure that both sides of any steps have handrails. Any raised decks and porches should have guardrails on the edges. Have any leaves, snow, or ice cleared regularly. Use sand or salt on walking paths during winter. Clean up any spills in your garage right away. This includes oil or grease spills. What can I do in the bathroom? Use night lights. Install grab bars by the toilet and in the tub and shower. Do not use towel bars as grab bars. Use non-skid mats or decals in the tub or shower. If you need to sit down in the shower, use a plastic, non-slip stool. Keep the floor dry. Clean up any water that spills on the floor as soon as it happens. Remove soap buildup in the tub or shower regularly. Attach bath mats  securely with double-sided non-slip rug tape. Do not have throw rugs and other things on the floor that can make you trip. What can I do in the bedroom? Use night lights. Make sure that you have a light by your bed that is easy to reach. Do not use any sheets or blankets that are too big for your bed. They should not hang down onto the floor. Have a firm chair that has side arms. You can use this for support while you get dressed. Do not have throw rugs and other things on the floor that can make you trip. What can I do in the kitchen? Clean up any spills right away. Avoid walking on wet floors. Keep items that you use a lot in easy-to-reach places. If you need to reach something above you, use a strong step stool that has a grab bar. Keep electrical cords out of the way. Do not use floor polish or wax that makes floors slippery. If you must  use wax, use non-skid floor wax. Do not have throw rugs and other things on the floor that can make you trip. What can I do with my stairs? Do not leave any items on the stairs. Make sure that there are handrails on both sides of the stairs and use them. Fix handrails that are broken or loose. Make sure that handrails are as long as the stairways. Check any carpeting to make sure that it is firmly attached to the stairs. Fix any carpet that is loose or worn. Avoid having throw rugs at the top or bottom of the stairs. If you do have throw rugs, attach them to the floor with carpet tape. Make sure that you have a light switch at the top of the stairs and the bottom of the stairs. If you do not have them, ask someone to add them for you. What else can I do to help prevent falls? Wear shoes that: Do not have high heels. Have rubber bottoms. Are comfortable and fit you well. Are closed at the toe. Do not wear sandals. If you use a stepladder: Make sure that it is fully opened. Do not climb a closed stepladder. Make sure that both sides of the stepladder are locked into place. Ask someone to hold it for you, if possible. Clearly mark and make sure that you can see: Any grab bars or handrails. First and last steps. Where the edge of each step is. Use tools that help you move around (mobility aids) if they are needed. These include: Canes. Walkers. Scooters. Crutches. Turn on the lights when you go into a dark area. Replace any light bulbs as soon as they burn out. Set up your furniture so you have a clear path. Avoid moving your furniture around. If any of your floors are uneven, fix them. If there are any pets around you, be aware of where they are. Review your medicines with your doctor. Some medicines can make you feel dizzy. This can increase your chance of falling. Ask your doctor what other things that you can do to help prevent falls. This information is not intended to replace  advice given to you by your health care provider. Make sure you discuss any questions you have with your health care provider. Document Released: 03/19/2009 Document Revised: 10/29/2015 Document Reviewed: 06/27/2014 Elsevier Interactive Patient Education  2017 [employer].
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
## 2022-04-22 ENCOUNTER — Other Ambulatory Visit: Payer: Self-pay | Admitting: Family Medicine

## 2022-04-22 ENCOUNTER — Telehealth: Payer: Self-pay | Admitting: Internal Medicine

## 2022-04-22 DIAGNOSIS — J189 Pneumonia, unspecified organism: Secondary | ICD-10-CM

## 2022-04-22 MED ORDER — METHYLPREDNISOLONE 4 MG PO TBPK: ORAL_TABLET | ORAL | 0 refills | Status: DC

## 2022-04-22 NOTE — Telephone Encounter (Signed)
Patient informed of the message below.

## 2022-04-22 NOTE — Telephone Encounter (Signed)
Patient saw Dr Legrand Como on 04/18/22 treated but feels she is not improving. Requesting something stronger  [id number] - [name], [address] [place] Phone: [phone]  Fax: [phone]

## 2022-04-22 NOTE — Telephone Encounter (Signed)
I can call in a medrol dose pak to see if this will help, she should finish the antibiotics I gave her.

## 2022-04-26 ENCOUNTER — Encounter: Payer: Self-pay | Admitting: Internal Medicine

## 2022-04-26 ENCOUNTER — Other Ambulatory Visit: Payer: Self-pay | Admitting: Internal Medicine

## 2022-04-26 ENCOUNTER — Ambulatory Visit (INDEPENDENT_AMBULATORY_CARE_PROVIDER_SITE_OTHER): Payer: PPO | Admitting: Internal Medicine

## 2022-04-26 VITALS — BP 138/90 | HR 78 | Temp 97.8°F | Wt 127.8 lb

## 2022-04-26 DIAGNOSIS — J471 Bronchiectasis with (acute) exacerbation: Secondary | ICD-10-CM

## 2022-04-26 DIAGNOSIS — Z79899 Other long term (current) drug therapy: Secondary | ICD-10-CM | POA: Diagnosis not present

## 2022-04-26 DIAGNOSIS — J189 Pneumonia, unspecified organism: Secondary | ICD-10-CM | POA: Diagnosis not present

## 2022-04-26 MED ORDER — AMOXICILLIN-POT CLAVULANATE 875-125 MG PO TABS: 1.0000 | ORAL_TABLET | Freq: Two times a day (BID) | ORAL | 0 refills | Status: DC
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
## 2022-04-26 NOTE — Patient Instructions (Addendum)
Glad you are feeling better  Lung exam is improved airflow      Since you have bronchiectasis     want to treat with antibiotic a bit longer than pneumonia alone.  Sending in augmentin ( amoxicillin based)  to take twice a day for another   5-7 days  to make a 10 - 14 day treatment. Hoping less side effecs ( can get diarrhea  if you do contact us for advice)  Rest  and fluids and inspiratory efforts .   Plan fu visit in 2-4weeks or if not getting better or worse .

## 2022-05-05 ENCOUNTER — Other Ambulatory Visit: Payer: Self-pay | Admitting: Internal Medicine

## 2022-05-06 ENCOUNTER — Telehealth: Payer: Self-pay | Admitting: Pharmacist

## 2022-05-06 NOTE — Chronic Care Management (AMB) (Signed)
    Chronic Care Management Pharmacy Assistant   Name: NEELAM TIGGS  MRN: [id number] DOB: 11/27/1940  05/06/22 APPOINTMENT REMINDER     Patient was reminded to have all medications, supplements and any blood glucose and blood pressure readings available for review with Jeni Salles, Pharm. D, for telephone visit on 05/09/22 at 11.    Care Gaps: COVID Booster - Overdue BP- 138/90 04/26/22 AWV- 04/21/22   Star Rating Drug: Losartan 100 mg - Last filled 02/12/22 90 DS at [facility] Verified    Medications: Outpatient Encounter Medications as of 05/06/2022  Medication Sig   amLODipine (NORVASC) 5 MG tablet TAKE ONE TABLET BY MOUTH ONE TIME DAILY   amoxicillin-clavulanate (AUGMENTIN) 875-125 MG tablet Take 1 tablet by mouth 2 (two) times daily.   B Complex-C (SUPER B COMPLEX PO) Take 1 tablet by mouth daily.   benzonatate (TESSALON) 100 MG capsule Take 1 capsule (100 mg total) by mouth 2 (two) times daily as needed for cough.   Calcium Carb-Cholecalciferol (CALCIUM + D3 PO) Take 1 tablet by mouth in the morning and at bedtime. Calcium Citrate   Coenzyme Q10 (CO Q 10 PO) Take 1 tablet by mouth at bedtime.   DULoxetine (CYMBALTA) 30 MG capsule TAKE ONE CAPSULE BY MOUTH ONE TIME DAILY ALONG WITH '60MG'$    DULoxetine (CYMBALTA) 60 MG capsule TAKE ONE CAPSULE BY MOUTH TWICE DAILY (PT NEEDS APPT FOR MORE REFILLS) (Patient taking differently: daily.)   ezetimibe (ZETIA) 10 MG tablet TAKE ONE TABLET BY MOUTH ONE TIME DAILY   gabapentin (NEURONTIN) 300 MG capsule TAKE ONE CAPSULE BY MOUTH IN THE MORNING AND TWO AT BEDTIME OR AS DIRECTED   losartan (COZAAR) 100 MG tablet TAKE ONE TABLET BY MOUTH ONE TIME DAILY   Multiple Vitamin (MULTIVITAMIN WITH MINERALS) TABS tablet Take 1 tablet by mouth daily. One-A-Day   omeprazole (PRILOSEC) 20 MG capsule Take 20 mg by mouth daily as needed (acid indigestion/heartburn/GERD).   Omeprazole Magnesium 20.6 (20 Base) MG CPDR daily as needed.   Polyethyl  Glycol-Propyl Glycol (LUBRICANT EYE DROPS) 0.4-0.3 % SOLN Place 1 drop into both eyes 3 (three) times daily as needed (dry/irritated eyes.).   psyllium (METAMUCIL SMOOTH TEXTURE) 28 % packet Take 1 packet by mouth daily as needed (constipation). (Patient not taking: Reported on 04/26/2022)   risedronate (ACTONEL) 35 MG tablet Take 1 tablet by mouth once a week with water on an empty stomach**don't eat or drink or lie down for the next 30 minutes**   sodium chloride (OCEAN) 0.65 % SOLN nasal spray Place 1 spray into both nostrils as needed (dry nose and mouth).   tiZANidine (ZANAFLEX) 2 MG tablet Take 1 tablet (2 mg total) by mouth at bedtime as needed for muscle spasms.   zolpidem (AMBIEN) 5 MG tablet take 1 tablet by mouth daily at bedtime as needed for sleep   No facility-administered encounter medications on file as of 05/06/2022.      [place] Clinical Pharmacist Assistant [phone]

## 2022-05-06 NOTE — Progress Notes (Deleted)
Chronic Care Management Pharmacy Note  05/06/2022 Name:  Julie Bonilla MRN:  [id number] DOB:  06/10/1940  Summary: BP is at goal < 140/90 Pt is sleeping much better  Recommendations/Changes made from today's visit: -Recommended continued BP monitoring at home -Recommended use of diclofenac gel to replace the Aleve to avoid consistent NSAID use  Plan: Follow up BP assessment in 3 months Follow up in 6 months  Subjective: Julie Bonilla is an 81 y.o. year old female who is a primary patient of Panosh, Standley Brooking, MD.  The CCM team was consulted for assistance with disease management and care coordination needs.    Engaged with patient by telephone for follow up visit in response to provider referral for pharmacy case management and/or care coordination services.   Consent to Services:  The patient was given information about Chronic Care Management services, agreed to services, and gave verbal consent prior to initiation of services.  Please see initial visit note for detailed documentation.   Patient Care Team: Panosh, Standley Brooking, MD as PCP - Gaston Islam, MD as Consulting Physician (Orthopedic Surgery) Pedro Earls, MD as Attending Physician (Family Medicine) Izora Gala, MD as Consulting Physician (Otolaryngology) Luberta Mutter, MD as Consulting Physician (Ophthalmology) Viona Gilmore, [facility] as Pharmacist (Pharmacist) Alda Berthold, DO as Consulting Physician (Neurology)  Recent office visits: 04/26/22 Shanon Ace, MD: Patient presented for pneumonia follow up.  Prescribed Augmentin. Follow up in 2-4 weeks if not getting better.  04/21/22 Rolene Arbour, LPN: Patient presented for AWV.  04/18/22 Loralyn Freshwater, MD: Patient presented for pneumonia. Prescribed Levaquin 500 mg and benzonatate PRN.  04/11/22 Shanon Ace, MD: Patient presented for annual exam. Plan for fasting lab visit in December. Follow up in 6 months.  Recent consult visits: 03/24/22  [facility]: Patient presented for UTI. Prescribed Cipro x 7 days.  03/18/22 [facility]: Patient presented for UTI. Prescribed cefdinir x 7 days.  12/31/21 Aluisio, Pilar Plate Aluisio ([facility]) - Patient presented for History of left total knee replacement and other concerns. Recommended Voltaren Gel.   11/04/21 Griselda Miner (Dermatology) - Claims encounter for Basal cell carcinoma of skin and other parts of face and other concerns. No other visit details available.  Hospital visits: None in previous 6 months  Objective:  Lab Results  Component Value Date   CREATININE 0.72 06/01/2021   BUN 18 06/01/2021   GFR 76.55 09/22/2020   GFRNONAA >60 06/01/2021   GFRAA >60 07/30/2019   NA 137 06/01/2021   K 4.3 06/01/2021   CALCIUM 8.8 (L) 06/01/2021   CO2 25 06/01/2021   GLUCOSE 115 (H) 06/01/2021    Lab Results  Component Value Date/Time   HGBA1C 5.7 09/27/2021 10:47 AM   HGBA1C 5.5 09/22/2020 04:13 PM   GFR 76.55 09/22/2020 04:13 PM   GFR 75.74 05/15/2019 03:51 PM    Last diabetic Eye exam:  Lab Results  Component Value Date/Time   HMDIABEYEEXA Done by Dr. Cheral Marker- Cateract surgery 06/07/2007 12:00 AM    Last diabetic Foot exam: No results found for: "HMDIABFOOTEX"   Lab Results  Component Value Date   CHOL 230 (H) 09/27/2021   HDL 69.90 09/27/2021   LDLCALC 136 (H) 09/27/2021   LDLDIRECT 149.0 05/15/2019   TRIG 120.0 09/27/2021   CHOLHDL 3 09/27/2021       Latest Ref Rng & Units 09/27/2021   10:47 AM 10/08/2020   11:21 AM 09/22/2020    4:13 PM  Hepatic Function  Total  Protein 6.0 - 8.3 g/dL 6.6  6.6  6.5   Albumin 3.5 - 5.2 g/dL 4.2  4.0  4.1   AST 0 - 37 U/L _0 ALT 0 - 35 U/L _1 Alk Phosphatase 39 - 117 U/L 56  53  54   Total Bilirubin 0.2 - 1.2 mg/dL 0.5  0.7  0.5   Bilirubin, Direct 0.0 - 0.3 mg/dL 0.1   0.1     Lab Results  Component Value Date/Time   TSH 1.37 09/22/2020 04:13 PM   TSH 1.95 11/01/2017 09:54 AM       Latest  Ref Rng & Units 06/01/2021    1:00 PM 10/13/2020    3:41 AM 10/08/2020   11:21 AM  CBC  WBC 4.0 - 10.5 K/uL 6.1  12.2  5.4   Hemoglobin 12.0 - 15.0 g/dL 13.3  10.5  13.8   Hematocrit 36.0 - 46.0 % 40.6  33.0  42.5   Platelets 150 - 400 K/uL 276  222  248     Lab Results  Component Value Date/Time   VD25OH 49 03/05/2010 09:52 PM    Clinical ASCVD: No  The ASCVD Risk score (Arnett DK, et al., 2019) failed to calculate for the following reasons:   The 2019 ASCVD risk score is only valid for ages 72 to 68       04/21/2022    2:34 PM 10/05/2021   10:53 AM 04/20/2021    2:47 PM  Depression screen PHQ 2/9  Decreased Interest 0 0 0  Down, Depressed, Hopeless 0 0 0  PHQ - 2 Score 0 0 0  Altered sleeping  0   Tired, decreased energy  0   Change in appetite  0   Feeling bad or failure about yourself   0   Trouble concentrating  0   Moving slowly or fidgety/restless  0   Suicidal thoughts  0   PHQ-9 Score  0   Difficult doing work/chores  Not difficult at all       Social History   Tobacco Use  Smoking Status Never  Smokeless Tobacco Never   BP Readings from Last 3 Encounters:  04/26/22 (!) 138/90  04/18/22 112/78  04/11/22 (!) 140/82   Pulse Readings from Last 3 Encounters:  04/26/22 78  04/18/22 88  04/11/22 75   Wt Readings from Last 3 Encounters:  04/26/22 127 lb 12.8 oz (58 kg)  04/21/22 129 lb (58.5 kg)  04/18/22 129 lb 4.8 oz (58.7 kg)   BMI Readings from Last 3 Encounters:  04/26/22 25.60 kg/m  04/21/22 25.84 kg/m  04/18/22 25.90 kg/m    Assessment/Interventions: Review of patient past medical history, allergies, medications, health status, including review of consultants reports, laboratory and other test data, was performed as part of comprehensive evaluation and provision of chronic care management services.   SDOH:  (Social Determinants of Health) assessments and interventions performed: Yes *** SDOH Interventions    Flowsheet Row Clinical  Support from 04/21/2022 in [place] [facility] [place] from 01/14/2020 in [place] at [place] Interventions Intervention Not Indicated Intervention Not Indicated  Housing Interventions Intervention Not Indicated Intervention Not Indicated  Transportation Interventions Intervention Not Indicated Intervention Not Indicated  Utilities Interventions Intervention Not Indicated --  Alcohol Usage Interventions Intervention Not Indicated (Score <7) --  Depression Interventions/Treatment  -- CWC3-7 Score <4 Follow-up Not  Indicated  Financial Strain Interventions Intervention Not Indicated Intervention Not Indicated  Physical Activity Interventions Intervention Not Indicated Intervention Not Indicated  Stress Interventions Intervention Not Indicated Intervention Not Indicated  Social Connections Interventions Intervention Not Indicated Intervention Not Indicated      SDOH Screenings   Food Insecurity: No Food Insecurity (04/21/2022)  Housing: Low Risk  (04/21/2022)  Transportation Needs: No Transportation Needs (04/21/2022)  Utilities: Not At Risk (04/21/2022)  Alcohol Screen: Low Risk  (04/21/2022)  Depression (PHQ2-9): Low Risk  (04/21/2022)  Financial Resource Strain: Low Risk  (04/21/2022)  Physical Activity: Insufficiently Active (04/21/2022)  Social Connections: Moderately Integrated (04/21/2022)  Stress: No Stress Concern Present (04/21/2022)  Tobacco Use: Low Risk  (04/26/2022)    CCM Care Plan  Allergies  Allergen Reactions   Crestor [Rosuvastatin] Other (See Comments)    Myalgia on 5 mg per day   Erythromycin Nausea And Vomiting    Oral  No hives rash or itching   Mobic [Meloxicam] Other (See Comments)    Moody and irritable     Medications Reviewed Today     Reviewed by [name], MD (Physician) on 04/26/22 at 1551  Med List Status: <None>   Medication Order Taking? Sig Documenting Provider  Last Dose Status Informant  amLODipine (NORVASC) 5 MG tablet [id number] Yes Take 1 tablet (5 mg total) by mouth daily. Panosh, Standley Brooking, MD Taking Active   amoxicillin-clavulanate (AUGMENTIN) 875-125 MG tablet [id number] Yes Take 1 tablet by mouth 2 (two) times daily. Panosh, Standley Brooking, MD  Active   B Complex-C (SUPER B COMPLEX PO) [id number] Yes Take 1 tablet by mouth daily. [provider] Taking Active Self  benzonatate (TESSALON) 100 MG capsule [id number] Yes Take 1 capsule (100 mg total) by mouth 2 (two) times daily as needed for cough. Farrel Conners, MD Taking Active   Calcium Carb-Cholecalciferol (CALCIUM + D3 PO) [id number] Yes Take 1 tablet by mouth in the morning and at bedtime. Calcium Citrate [provider] Taking Active Self  Coenzyme Q10 (CO Q 10 PO) [id number] Yes Take 1 tablet by mouth at bedtime. [provider] Taking Active Self  DULoxetine (CYMBALTA) 30 MG capsule [id number] Yes TAKE ONE CAPSULE BY MOUTH ONE TIME DAILY ALONG WITH 60MG Panosh, Standley Brooking, MD Taking Active   DULoxetine (CYMBALTA) 60 MG capsule [id number] Yes TAKE ONE CAPSULE BY MOUTH TWICE DAILY (PT NEEDS APPT FOR MORE REFILLS)  Patient taking differently: daily.   Panosh, Standley Brooking, MD Taking Active   ezetimibe (ZETIA) 10 MG tablet [id number] Yes TAKE ONE TABLET BY MOUTH ONE TIME DAILY Panosh, Standley Brooking, MD Taking Active   gabapentin (NEURONTIN) 300 MG capsule [id number] Yes TAKE ONE CAPSULE BY MOUTH IN THE MORNING AND TWO AT BEDTIME OR AS DIRECTED Panosh, Standley Brooking, MD Taking Active   losartan (COZAAR) 100 MG tablet [id number] Yes Take 1 tablet (100 mg total) by mouth daily. Panosh, Standley Brooking, MD Taking Active   Multiple Vitamin (MULTIVITAMIN WITH MINERALS) TABS tablet [id number] Yes Take 1 tablet by mouth daily. One-A-Day [provider] Taking Active Self  omeprazole (PRILOSEC) 20 MG capsule [id number] Yes Take 20 mg by mouth daily as needed (acid indigestion/heartburn/GERD). [provider] Taking Active Self  Omeprazole Magnesium 20.6 (20 Base) MG CPDR [id number] Yes daily as needed. [provider] Taking Active   Polyethyl Glycol-Propyl Glycol (LUBRICANT EYE DROPS) 0.4-0.3 % SOLN [id number] Yes Place 1 drop into both eyes 3 (three) times daily as needed (dry/irritated eyes.). [provider] Taking Active Self  psyllium (METAMUCIL SMOOTH TEXTURE) 28 % packet [id number] No Take 1 packet by mouth daily as needed (constipation).  Patient not taking: Reported on 04/26/2022   [provider] Not Taking Active Self  risedronate (ACTONEL) 35 MG tablet [id number] Yes Take 1 tablet by mouth once a week with water on an empty stomach**don't eat or drink or lie down for the next 30 minutes** Panosh, Standley Brooking, MD Taking Active   sodium chloride (OCEAN) 0.65 % SOLN nasal spray [id number] Yes Place 1 spray into both nostrils as needed (dry nose and mouth). [provider] Taking Active Self  tiZANidine (ZANAFLEX) 2 MG tablet [id number] Yes Take 1 tablet (2 mg total) by mouth at bedtime as needed for muscle spasms. Narda Amber K, DO Taking Active   zolpidem (AMBIEN) 5 MG tablet [id number] Yes take 1 tablet by mouth daily at bedtime as needed for sleep Panosh, Standley Brooking, MD Taking Active             Patient Active Problem List   Diagnosis Date Noted   Primary osteoarthritis of left knee 10/12/2020   OA (osteoarthritis) of knee 07/29/2019   S/P total knee arthroplasty, right 07/29/2019   Osteoporosis without current pathological fracture 06/19/2018   Allergic rhinitis 12/27/2016   Memory loss 12/04/2016   Attention deficit 12/04/2016   Hx of bacterial pneumonia 02/23/2015   Muscular deconditioning 11/06/2014   Body aches 04/02/2014   Hyperlipidemia 04/02/2014   Urinary urgency 04/02/2014   Medication management 01/21/2014   Internal nasal lesion 12/26/2013   Visit for preventive health examination 07/15/2013   Essential hypertension 07/15/2013   Knee  pain 01/28/2013   GERD (gastroesophageal reflux disease) 04/05/2012   Episode of generalized weakness 01/12/2012   Otalgia of both ears 01/12/2012   Tremor intermittent intention 01/12/2012   Hearing loss 03/05/2010   ACQUIRED MUSCULOSKELETAL DEFORMITY OTH SPEC SITE 06/19/2009   BACK PAIN, LUMBAR 06/27/2007   BACK PAIN, THORACIC REGION 03/27/2007   OSTEOPENIA 12/17/2006   ADHD 10/24/2006   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   Hereditary and idiopathic peripheral neuropathy 10/24/2006   GERD 10/24/2006   Diverticulosis of colon (without mention of hemorrhage) 10/24/2006   CHILBLAINS 10/24/2006   ACNE ROSACEA, HX OF 10/24/2006    Immunization History  Administered Date(s) Administered   Fluad Quad(high Dose 65+) 03/18/2021, 03/11/2022   Influenza Split 03/21/2011, 04/05/2012   Influenza Whole 03/27/2007, 03/25/2008, 03/12/2009, 03/05/2010   Influenza, High Dose Seasonal PF 02/23/2015, 04/12/2016, 02/23/2017, 03/22/2018, 05/06/2020   Influenza,inj,Quad PF,6+ Mos 04/18/2013, 04/02/2014   Influenza,inj,quad, With Preservative 04/06/2017, 04/06/2018, 03/07/2019   Influenza-Unspecified 04/15/2020   PFIZER(Purple Top)SARS-COV-2 Vaccination 06/13/2019, 07/04/2019, 04/08/2020, 03/27/2021   Pneumococcal Conjugate-13 07/15/2013   Pneumococcal Polysaccharide-23 03/27/2007   Td 05/06/1996, 03/05/2010   Td (Adult), 2 Lf Tetanus Toxid, Preservative Free 05/06/1996, 03/05/2010   Tdap 08/08/2018   Unspecified SARS-COV-2 Vaccination 07/16/2019   Zoster Recombinat (Shingrix) 10/05/2017, 01/29/2018   Zoster, Live 03/05/2010   Patient's sister is terminally ill with cancer and heart problems. They have a close knit family and her family members come over to relieve them as well. Hospice has been coming twice a week to help relieve them as well.    Patient doesn't feel excessively tired during the day but she has been sore lately. She has been gardening more and this makes her  sore.   Recommendations/Changes made from today's visit: -Recommended continued BP monitoring at home -Recommended use of diclofenac gel  to replace the Aleve to avoid consistent NSAID use  Feeling better post pneumonia?  Conditions to be addressed/monitored:  Hypertension, Hyperlipidemia, GERD, Osteoporosis, Osteoarthritis, Allergic Rhinitis and Insomnia, Nerve Pain, Constipation  Conditions addressed this visit: Insomnia, hypertension, nerve pain  There are no care plans that you recently modified to display for this patient.     Medication Assistance: None required.  Patient affirms current coverage meets needs.   Compliance/Adherence/Medication fill history: Care Gaps: COVID booster BP- 138/90 04/26/22   Star-Rating Drugs: Losartan 100 mg - Last filled 02/12/22 90 DS at [facility]   Patient's preferred pharmacy is:  [facility] # [address] [place] [place] [address] Phone: [phone] Fax: [phone]  [id number] - [place], [place] - [address] AT [address] [place] [address] Phone: [phone] Fax: [phone]   Uses pill box? Yes Pt endorses 100% compliance  We discussed: Current pharmacy is preferred with insurance plan and patient is satisfied with pharmacy services Patient decided to: Continue current medication management strategy  Care Plan and Follow Up Patient Decision:  Patient agrees to Care Plan and Follow-up.  Plan: Telephone follow up appointment with care management team member scheduled for:  6 months  Jeni Salles, PharmD [place] Pharmacist [place] [facility] [place] [phone]

## 2022-05-09 ENCOUNTER — Telehealth: Payer: Self-pay | Admitting: Pharmacist

## 2022-05-09 ENCOUNTER — Telehealth: Payer: PPO

## 2022-05-09 NOTE — Telephone Encounter (Signed)
  Chronic Care Management   Outreach Note  05/09/2022 Name: KHRISTIN KELEHER MRN: [id number] DOB: 08-Mar-1941  Referred by: [name], MD  Patient had a phone appointment scheduled with clinical pharmacist today.  An unsuccessful telephone outreach was attempted today. The patient was referred to the pharmacist for assistance with care management and care coordination.   If possible, a message was left to return call to: [phone] or to [facility]: [place], PharmD, [place] Pharmacist [place] [facility] [place]

## 2022-05-11 ENCOUNTER — Telehealth: Payer: Self-pay | Admitting: Pharmacist

## 2022-05-11 ENCOUNTER — Telehealth: Payer: Self-pay | Admitting: Internal Medicine

## 2022-05-11 ENCOUNTER — Other Ambulatory Visit (INDEPENDENT_AMBULATORY_CARE_PROVIDER_SITE_OTHER): Payer: PPO

## 2022-05-11 DIAGNOSIS — G4700 Insomnia, unspecified: Secondary | ICD-10-CM

## 2022-05-11 DIAGNOSIS — E785 Hyperlipidemia, unspecified: Secondary | ICD-10-CM

## 2022-05-11 DIAGNOSIS — M170 Bilateral primary osteoarthritis of knee: Secondary | ICD-10-CM | POA: Diagnosis not present

## 2022-05-11 DIAGNOSIS — I10 Essential (primary) hypertension: Secondary | ICD-10-CM | POA: Diagnosis not present

## 2022-05-11 DIAGNOSIS — Z79899 Other long term (current) drug therapy: Secondary | ICD-10-CM | POA: Diagnosis not present

## 2022-05-11 DIAGNOSIS — G609 Hereditary and idiopathic neuropathy, unspecified: Secondary | ICD-10-CM | POA: Diagnosis not present

## 2022-05-11 LAB — CBC WITH DIFFERENTIAL/PLATELET
Basophils Absolute: 0.1 10*3/uL (ref 0.0–0.1)
Basophils Relative: 0.9 % (ref 0.0–3.0)
Eosinophils Absolute: 0.2 10*3/uL (ref 0.0–0.7)
Eosinophils Relative: 3.6 % (ref 0.0–5.0)
HCT: 41.4 % (ref 36.0–46.0)
Hemoglobin: 13.9 g/dL (ref 12.0–15.0)
Lymphocytes Relative: 38.3 % (ref 12.0–46.0)
Lymphs Abs: 2.1 10*3/uL (ref 0.7–4.0)
MCHC: 33.5 g/dL (ref 30.0–36.0)
MCV: 94 fl (ref 78.0–100.0)
Monocytes Absolute: 0.5 10*3/uL (ref 0.1–1.0)
Monocytes Relative: 9.2 % (ref 3.0–12.0)
Neutro Abs: 2.6 10*3/uL (ref 1.4–7.7)
Neutrophils Relative %: 48 % (ref 43.0–77.0)
Platelets: 271 10*3/uL (ref 150.0–400.0)
RBC: 4.41 Mil/uL (ref 3.87–5.11)
RDW: 13.6 % (ref 11.5–15.5)
WBC: 5.5 10*3/uL (ref 4.0–10.5)

## 2022-05-11 LAB — HEPATIC FUNCTION PANEL
ALT: 17 U/L (ref 0–35)
AST: 20 U/L (ref 0–37)
Albumin: 4.2 g/dL (ref 3.5–5.2)
Alkaline Phosphatase: 51 U/L (ref 39–117)
Bilirubin, Direct: 0.1 mg/dL (ref 0.0–0.3)
Total Bilirubin: 0.5 mg/dL (ref 0.2–1.2)
Total Protein: 6.7 g/dL (ref 6.0–8.3)

## 2022-05-11 LAB — BASIC METABOLIC PANEL
BUN: 14 mg/dL (ref 6–23)
CO2: 31 mEq/L (ref 19–32)
Calcium: 9.3 mg/dL (ref 8.4–10.5)
Chloride: 103 mEq/L (ref 96–112)
Creatinine, Ser: 0.74 mg/dL (ref 0.40–1.20)
GFR: 75.68 mL/min (ref >60.00)
Glucose, Bld: 96 mg/dL (ref 70–99)
Potassium: 4.3 mEq/L (ref 3.5–5.1)
Sodium: 138 mEq/L (ref 135–145)

## 2022-05-11 LAB — TSH: TSH: 1.62 u[IU]/mL (ref 0.35–5.50)

## 2022-05-11 LAB — HEMOGLOBIN A1C: Hgb A1c MFr Bld: 5.7 % (ref 4.6–6.5)

## 2022-05-11 LAB — LIPID PANEL
Cholesterol: 252 mg/dL — ABNORMAL HIGH (ref 0–200)
HDL: 75.1 mg/dL (ref >39.00)
LDL Cholesterol: 143 mg/dL — ABNORMAL HIGH (ref 0–99)
NonHDL: 177.39
Total CHOL/HDL Ratio: 3
Triglycerides: 174 mg/dL — ABNORMAL HIGH (ref 0.0–149.0)
VLDL: 34.8 mg/dL (ref 0.0–40.0)

## 2022-05-11 NOTE — Telephone Encounter (Signed)
Spoke with patient, informed of message.    Patient has an upcoming appointment with Dr. Regis Bill on 05/24/22.   Patient said that she will wait until after her appointment and xray is complete on 05/24/22.

## 2022-05-11 NOTE — Progress Notes (Unsigned)
Chronic Care Management Pharmacy Assistant   Name: AARA JACQUOT  MRN: [id number] DOB: May 26, 1941  Reason for Encounter: Offer to reschedule missed encounter with Jeni Salles Clinical Pharmacist.    Recent office visits:  04/26/22 [name], MD - Patient presented for Community acquired pneumonia of left lower lobe of lung and other concerns. Prescribed Augmentin.  04/21/22 Criselda Peaches, LPN - Patient presented for [facility] Annual Wellness Exam and other concerns. No medication changes.   04/18/22 Farrel Conners, MD - Patient presented for Community acquired pneumonia of left lowe lobe of lung. Prescribed Benzonatate. Prescribed Levofloxacin. Stopped Magnesium Oxide.   04/11/22 Panosh, Standley Brooking, MD - Patient presented for Preventative health examination and  other concerns. No medication changes.  Recent consult visits:  03/24/22 Lurlean Leyden, DO - Patient presented for Urinary frequency/ UTI Prescribed Cipro  03/18/22 Cyndie Chime, MD  - Patient presented for Dysuria Advised to use Azo and Tylenol until culture came back.  Hospital visits:  None in previous 6 months  Medications: Outpatient Encounter Medications as of 05/11/2022  Medication Sig   amLODipine (NORVASC) 5 MG tablet TAKE ONE TABLET BY MOUTH ONE TIME DAILY   amoxicillin-clavulanate (AUGMENTIN) 875-125 MG tablet Take 1 tablet by mouth 2 (two) times daily.   B Complex-C (SUPER B COMPLEX PO) Take 1 tablet by mouth daily.   benzonatate (TESSALON) 100 MG capsule Take 1 capsule (100 mg total) by mouth 2 (two) times daily as needed for cough.   Calcium Carb-Cholecalciferol (CALCIUM + D3 PO) Take 1 tablet by mouth in the morning and at bedtime. Calcium Citrate   Coenzyme Q10 (CO Q 10 PO) Take 1 tablet by mouth at bedtime.   DULoxetine (CYMBALTA) 30 MG capsule TAKE ONE CAPSULE BY MOUTH ONE TIME DAILY ALONG WITH '60MG'$    DULoxetine (CYMBALTA) 60 MG capsule TAKE ONE CAPSULE BY MOUTH TWICE DAILY  (PT NEEDS APPT FOR MORE REFILLS) (Patient taking differently: daily.)   ezetimibe (ZETIA) 10 MG tablet TAKE ONE TABLET BY MOUTH ONE TIME DAILY   gabapentin (NEURONTIN) 300 MG capsule TAKE ONE CAPSULE BY MOUTH IN THE MORNING AND TWO AT BEDTIME OR AS DIRECTED   losartan (COZAAR) 100 MG tablet TAKE ONE TABLET BY MOUTH ONE TIME DAILY   Multiple Vitamin (MULTIVITAMIN WITH MINERALS) TABS tablet Take 1 tablet by mouth daily. One-A-Day   omeprazole (PRILOSEC) 20 MG capsule Take 20 mg by mouth daily as needed (acid indigestion/heartburn/GERD).   Omeprazole Magnesium 20.6 (20 Base) MG CPDR daily as needed.   Polyethyl Glycol-Propyl Glycol (LUBRICANT EYE DROPS) 0.4-0.3 % SOLN Place 1 drop into both eyes 3 (three) times daily as needed (dry/irritated eyes.).   psyllium (METAMUCIL SMOOTH TEXTURE) 28 % packet Take 1 packet by mouth daily as needed (constipation). (Patient not taking: Reported on 04/26/2022)   risedronate (ACTONEL) 35 MG tablet Take 1 tablet by mouth once a week with water on an empty stomach**don't eat or drink or lie down for the next 30 minutes**   sodium chloride (OCEAN) 0.65 % SOLN nasal spray Place 1 spray into both nostrils as needed (dry nose and mouth).   tiZANidine (ZANAFLEX) 2 MG tablet Take 1 tablet (2 mg total) by mouth at bedtime as needed for muscle spasms.   zolpidem (AMBIEN) 5 MG tablet TAKE ONE TABLET BY MOUTH AT BEDTIME AS NEEDED FOR SLEEP   No facility-administered encounter medications on file as of 05/11/2022.   Notes: Call to patient to offer to reschedule missed  encounter with MP did not reach left voicemail with my contact information for a return call for scheduling.   Care Gaps: COVID Booster - Overdue AWV- 04/21/22 CCM- Need BP- 138/90 04/26/22  Star Rating Drugs: Losartan 100 mg - Last filled 02/11/22 90 DS at [facility] Pharmacist Assistant [phone]

## 2022-05-11 NOTE — Telephone Encounter (Signed)
Patient is requesting a call back about getting the RSV shot.  She had pneumonia finishing up her medication for it a week ago.  She wants to make sure when to get it.

## 2022-05-11 NOTE — Telephone Encounter (Signed)
It's ok to receive the RSV vaccine as long as her pneumonia symptoms have resolved. I would maybe wait another week or so to make sure the pneumonia has resolved.

## 2022-05-11 NOTE — Telephone Encounter (Signed)
Dr. Velora Mediate patient.   Can you please assist with this?   Thanks.

## 2022-05-18 ENCOUNTER — Other Ambulatory Visit: Payer: Self-pay | Admitting: Internal Medicine

## 2022-05-24 ENCOUNTER — Ambulatory Visit: Payer: PPO | Admitting: Internal Medicine

## 2022-05-27 ENCOUNTER — Ambulatory Visit: Payer: PPO | Admitting: Family Medicine

## 2022-05-28 ENCOUNTER — Other Ambulatory Visit: Payer: Self-pay | Admitting: Internal Medicine

## 2022-05-31 ENCOUNTER — Ambulatory Visit (INDEPENDENT_AMBULATORY_CARE_PROVIDER_SITE_OTHER): Payer: PPO | Admitting: Family Medicine

## 2022-05-31 ENCOUNTER — Encounter: Payer: Self-pay | Admitting: Family Medicine

## 2022-05-31 ENCOUNTER — Ambulatory Visit (INDEPENDENT_AMBULATORY_CARE_PROVIDER_SITE_OTHER)
Admission: RE | Admit: 2022-05-31 | Discharge: 2022-05-31 | Disposition: A | Discharge status: Home / Self Care | Payer: PPO | Source: Ambulatory Visit | Attending: Family Medicine | Admitting: Family Medicine

## 2022-05-31 VITALS — BP 122/78 | HR 71 | Temp 98.1°F | Wt 128.0 lb

## 2022-05-31 DIAGNOSIS — J189 Pneumonia, unspecified organism: Secondary | ICD-10-CM

## 2022-05-31 DIAGNOSIS — J90 Pleural effusion, not elsewhere classified: Secondary | ICD-10-CM | POA: Diagnosis not present

## 2022-05-31 NOTE — Progress Notes (Signed)
   Subjective:    Patient ID: Julie Bonilla, female    DOB: 1940-08-29, 81 y.o.   MRN: [id number]  HPI Here to follow up a CAP. She saw Dr. Legrand Como on 04-18-22 for a LLL pneumonia heard on exam and seen on CXR. She was treated with a course of Levaquin. She then saw Dr. Regis Bill on 04-26-22, and she was felt to be partially over it. She was then given a round of Augmentin, and this seemed to clear it out. She now feels back to normal except for some fatigue. No cough or fever or SOB.    Review of Systems  Constitutional:  Positive for fatigue.  Respiratory: Negative.    Cardiovascular: Negative.        Objective:   Physical Exam Constitutional:      Appearance: Normal appearance. She is not ill-appearing.  Cardiovascular:     Rate and Rhythm: Normal rate and regular rhythm.     Pulses: Normal pulses.     Heart sounds: Normal heart sounds.  Pulmonary:     Effort: Pulmonary effort is normal. No respiratory distress.     Breath sounds: No wheezing, rhonchi or rales.     Comments: Fine crackles at both bases  Neurological:     Mental Status: She is alert.           Assessment & Plan:  Clinically her CAP has cleared. We will send her for a CXR today to make sure.  Alysia Penna, MD

## 2022-06-09 ENCOUNTER — Other Ambulatory Visit: Payer: Self-pay | Admitting: Adult Health

## 2022-06-14 ENCOUNTER — Telehealth: Payer: Self-pay | Admitting: Adult Health

## 2022-06-14 DIAGNOSIS — L570 Actinic keratosis: Secondary | ICD-10-CM | POA: Diagnosis not present

## 2022-06-14 DIAGNOSIS — Z85828 Personal history of other malignant neoplasm of skin: Secondary | ICD-10-CM | POA: Diagnosis not present

## 2022-06-14 DIAGNOSIS — L821 Other seborrheic keratosis: Secondary | ICD-10-CM | POA: Diagnosis not present

## 2022-06-15 ENCOUNTER — Other Ambulatory Visit: Payer: Self-pay | Admitting: Internal Medicine

## 2022-06-23 ENCOUNTER — Other Ambulatory Visit: Payer: Self-pay | Admitting: Internal Medicine

## 2022-06-29 NOTE — Telephone Encounter (Signed)
Patient calling to check on progress of this refil.

## 2022-06-30 ENCOUNTER — Other Ambulatory Visit: Payer: Self-pay | Admitting: Adult Health

## 2022-07-01 NOTE — Telephone Encounter (Signed)
Okay for refill?  

## 2022-07-02 ENCOUNTER — Other Ambulatory Visit: Payer: Self-pay | Admitting: Internal Medicine

## 2022-07-11 DIAGNOSIS — Z1231 Encounter for screening mammogram for malignant neoplasm of breast: Secondary | ICD-10-CM | POA: Diagnosis not present

## 2022-07-11 LAB — HM MAMMOGRAPHY

## 2022-08-11 ENCOUNTER — Other Ambulatory Visit: Payer: Self-pay | Admitting: Internal Medicine

## 2022-08-11 ENCOUNTER — Other Ambulatory Visit: Payer: Self-pay | Admitting: Family

## 2022-08-15 NOTE — Telephone Encounter (Signed)
Pt called to say she has been without this medication for over a week  Please send a 90 day refill to:  [facility] # [place], [place] Phone: [phone]  Fax: [phone]

## 2022-08-16 ENCOUNTER — Other Ambulatory Visit: Payer: Self-pay | Admitting: Internal Medicine

## 2022-08-30 ENCOUNTER — Encounter: Payer: Self-pay | Admitting: Internal Medicine

## 2022-08-30 ENCOUNTER — Ambulatory Visit (INDEPENDENT_AMBULATORY_CARE_PROVIDER_SITE_OTHER): Payer: PPO | Admitting: Internal Medicine

## 2022-08-30 VITALS — BP 138/84 | HR 82 | Temp 98.2°F | Ht 59.25 in | Wt 126.6 lb

## 2022-08-30 DIAGNOSIS — G4700 Insomnia, unspecified: Secondary | ICD-10-CM | POA: Diagnosis not present

## 2022-08-30 DIAGNOSIS — G609 Hereditary and idiopathic neuropathy, unspecified: Secondary | ICD-10-CM

## 2022-08-30 DIAGNOSIS — M48061 Spinal stenosis, lumbar region without neurogenic claudication: Secondary | ICD-10-CM

## 2022-08-30 DIAGNOSIS — F909 Attention-deficit hyperactivity disorder, unspecified type: Secondary | ICD-10-CM | POA: Diagnosis not present

## 2022-08-30 DIAGNOSIS — Z79899 Other long term (current) drug therapy: Secondary | ICD-10-CM | POA: Diagnosis not present

## 2022-08-30 DIAGNOSIS — R4584 Anhedonia: Secondary | ICD-10-CM | POA: Diagnosis not present
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
## 2022-08-30 NOTE — Patient Instructions (Addendum)
Can consider trying adding on  low dose bupropion 150xr ( used for depression  and also adhd) Since  already on cymbalta .  You had adderall for a while in 2018 2019 and didn't help.  Dr Posey Pronto did your MRI back in 2022 . I think PT or some help for back may help your mood .

## 2022-09-13 ENCOUNTER — Other Ambulatory Visit: Payer: Self-pay | Admitting: Family

## 2022-09-19 ENCOUNTER — Telehealth: Payer: Self-pay | Admitting: Internal Medicine

## 2022-09-19 NOTE — Telephone Encounter (Signed)
Pt asking bout her pneumonia shot, not sure if she should start over due to the time between shots, please advise

## 2022-09-19 NOTE — Telephone Encounter (Signed)
No need to start over ( you had prevnar 13 and penumovax23 in past ) However you can choose to do the prevnar 20 since has been 9 years since the last and you have a his of pna and lung issues  Can get at any pharmacy .

## 2022-09-20 ENCOUNTER — Telehealth: Payer: Self-pay | Admitting: Internal Medicine

## 2022-09-20 MED ORDER — ZOLPIDEM TARTRATE 5 MG PO TABS: ORAL_TABLET | ORAL | 2 refills | Status: DC

## 2022-09-20 NOTE — Telephone Encounter (Signed)
Prescription Request  09/20/2022  LOV: 08/30/2022  What is the name of the medication or equipment? zolpidem (AMBIEN) 5 MG tablet   Have you contacted your pharmacy to request a refill? Yes   Pt states she has been without for 3 days and she is not doing well.  Please send refill as soon as possible.  Which pharmacy would you like this sent to?   [id number] - [place], [place] - [address] Phone: [phone]  Fax: [phone]      Patient notified that their request is being sent to the clinical staff for review and that they should receive a response within 2 business days.   Please advise at Mobile [phone] (mobile)

## 2022-09-20 NOTE — Telephone Encounter (Signed)
Attempted to reach pt. Left a voicemail to call us back.  

## 2022-09-20 NOTE — Telephone Encounter (Signed)
Ambien refilled  w rf x 2

## 2022-09-21 NOTE — Telephone Encounter (Signed)
Attempted to reach pt. Left a voicemail to call us back.  

## 2022-09-21 NOTE — Telephone Encounter (Signed)
Spoke to pt. Pt is aware of message from Dr. Fabian Sharp. Verbalized understanding.

## 2022-09-21 NOTE — Telephone Encounter (Signed)
Attempted to update pt. Left a detail message that it was sent.

## 2022-09-22 ENCOUNTER — Other Ambulatory Visit: Payer: Self-pay | Admitting: Internal Medicine

## 2022-09-27 ENCOUNTER — Encounter: Payer: Self-pay | Admitting: Neurology

## 2022-09-27 ENCOUNTER — Ambulatory Visit: Payer: PPO | Admitting: Neurology

## 2022-09-27 VITALS — BP 132/76 | HR 85 | Ht 59.0 in | Wt 126.0 lb

## 2022-09-27 DIAGNOSIS — M5417 Radiculopathy, lumbosacral region: Secondary | ICD-10-CM | POA: Diagnosis not present

## 2022-09-27 DIAGNOSIS — G629 Polyneuropathy, unspecified: Secondary | ICD-10-CM

## 2022-09-27 NOTE — Patient Instructions (Signed)
Start physical therapy for low back strengthening  I will see you back in 4 months

## 2022-09-27 NOTE — Progress Notes (Signed)
Follow-up Visit   Date: 09/27/22   Julie Bonilla MRN: [id number] DOB: [birth year]   Interim History: Julie Bonilla is a 82 y.o. left-handed Caucasian female with depression, insomnia, hypertension, and neuropathy returning to the clinic for follow-up of left leg pain and neuropathy.  The patient was accompanied to the clinic by self.  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 No radicular leg pain, but does endorse episodic low back pain  - Continue tizanidine  at bedtime prn - Start PT for low back stretching and strengthening  2.  Idiopathic peripheral neuropathy, stable and pain is well-controlled on current medications  - Continue gabapentin  in the morning and  at bedtime + Cymbalta /d  - Fall precautions discussed  Return to clinic in 4 months ------------------------------------------------- History of present illness: Starting around 2021, she was having a tight sensation over the left lower leg and foot, is if it was in a collar.  It involves her lower leg towards her ankle.  Symptoms are constant.  No worsening with rest or activity. She does not have these symptoms in the right leg.  She has chronic low back pain, denies radicular pain.  She has painful neuropathy in the feet and says that this is very different. Pain is controlled with gabapentin  in the morning and  at bedtime and Cymbalta .   UPDATE 08/13/2021:  She is here for follow-up visit.  Neuropathy has been stable and well-controlled on gabapentin  and  bedtime + Cymbalta  daily.  She continues to have episodic tight sensation around the left lower leg and ankle, usually with prolonged standing.  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  PT was offered, but she was having upcoming knee surgery and needed to focus on that. Unfortunately, following her knee surgery she fractured her patella.  She is followed by  orthopedics.Her balance is slightly worse, she walks unassisted.   UPDATE 09/27/2022:  She is here for follow-up visit.  She reports having worsening back pain in the low back region as well as between her shoulders. No shooting pain down her legs or weakness.  Her neuropathy is stable and controlled on gabapentin /d and Cymbalta /d.  She would like to try PT for her back pain.  She lives at home with her twin sister.   Medications:  Current Outpatient Medications on File Prior to Visit  Medication Sig Dispense Refill   amLODipine (NORVASC) 5 MG tablet TAKE ONE TABLET BY MOUTH ONE TIME DAILY 90 tablet 0   B Complex-C (SUPER B COMPLEX PO) Take 1 tablet by mouth daily.     Calcium Carb-Cholecalciferol (CALCIUM + D3 PO) Take 1 tablet by mouth in the morning and at bedtime. Calcium Citrate     Coenzyme Q10 (CO Q 10 PO) Take 1 tablet by mouth at bedtime.     DULoxetine (CYMBALTA) 30 MG capsule take 1 capsule by mouth once a day along with 60 mg 90 capsule 1   DULoxetine (CYMBALTA) 60 MG capsule TAKE ONE CAPSULE BY MOUTH TWICE DAILY 180 capsule 0   ezetimibe (ZETIA) 10 MG tablet TAKE ONE TABLET BY MOUTH ONE TIME DAILY 90 tablet 0   gabapentin (NEURONTIN) 300 MG capsule TAKE ONE CAPSULE BY MOUTH IN THE MORNING AND TWO AT BEDTIME OR AS DIRECTED 270 capsule 0   losartan (COZAAR) 100 MG tablet TAKE ONE TABLET BY MOUTH ONE TIME DAILY 90 tablet 0   Multiple Vitamin (MULTIVITAMIN WITH  MINERALS) TABS tablet Take 1 tablet by mouth daily. One-A-Day     Omeprazole Magnesium 20.6 (20 Base) MG CPDR daily as needed.     Polyethyl Glycol-Propyl Glycol (LUBRICANT EYE DROPS) 0.4-0.3 % SOLN Place 1 drop into both eyes 3 (three) times daily as needed (dry/irritated eyes.).     psyllium (METAMUCIL SMOOTH TEXTURE) 28 % packet Take 1 packet by mouth daily as needed (constipation).     risedronate (ACTONEL) 35 MG tablet TAKE 1 TABLET BY MOUTH ONCE A WEEK WITH WATER ON EMPTY STOMACH. DONT EAT, DRINK OR LIE DOWN FOR  NEXT 30 MINUTES 4 tablet 0   sodium chloride (OCEAN) 0.65 % SOLN nasal spray Place 1 spray into both nostrils as needed (dry nose and mouth).     tiZANidine (ZANAFLEX) 2 MG tablet Take 1 tablet (2 mg total) by mouth at bedtime as needed for muscle spasms. 30 tablet 3   zolpidem (AMBIEN) 5 MG tablet TAKE ONE TABLET BY MOUTH AT BEDTIME AS NEEDED FOR SLEEP 30 tablet 2   No current facility-administered medications on file prior to visit.    Allergies:  Allergies  Allergen Reactions   Crestor [Rosuvastatin] Other (See Comments)    Myalgia on 5 mg per day   Erythromycin Nausea And Vomiting    Oral  No hives rash or itching   Mobic [Meloxicam] Other (See Comments)    Moody and irritable     Vital Signs:  BP (!) 153/79   Pulse 85   Ht  (1.499 m)   Wt 126 lb (57.2 kg)   SpO2 97%   BMI 25.45 kg/m   Neurological Exam: MENTAL STATUS including orientation to time, place, person, recent and remote memory, attention span and concentration, language, and fund of knowledge is normal.  Speech is not dysarthric.  CRANIAL NERVES:   Pupils equal round and reactive to light.  Normal conjugate, extra-ocular eye movements in all directions of gaze.  No ptosis.  Face is symmetric.   MOTOR:  Motor strength is 5/5 in all extremities.  No atrophy, fasciculations or abnormal movements.  No pronator drift.  Tone is normal.    MSRs:  Reflexes are 2+/4 throughout.  SENSORY:  Vibration reduced in the feet.  COORDINATION/GAIT:  Normal finger-to- nose-finger.   Gait is mildly wide-based, unsteady with turns, unassisted.    Data: MRI lumbar spine 09/24/2020: 1. 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 2. Moderate right L1-2 and L2-3 neural foraminal stenosis with right lateral recess narrowing at both levels. 3. No central spinal canal stenosis.    Thank you for allowing me to participate in patient's  care.  If I can answer any additional questions, I would be pleased to do so.    Sincerely,    Blake Goya K. Allena Katz, DO

## 2022-10-10 ENCOUNTER — Other Ambulatory Visit: Payer: Self-pay | Admitting: Family

## 2022-10-10 ENCOUNTER — Other Ambulatory Visit: Payer: Self-pay | Admitting: Internal Medicine

## 2022-10-10 DIAGNOSIS — M25512 Pain in left shoulder: Secondary | ICD-10-CM | POA: Diagnosis not present

## 2022-10-10 DIAGNOSIS — M25511 Pain in right shoulder: Secondary | ICD-10-CM | POA: Diagnosis not present

## 2022-10-18 NOTE — Telephone Encounter (Signed)
Pt is calling checking on the status of refill 

## 2022-10-19 ENCOUNTER — Other Ambulatory Visit: Payer: Self-pay | Admitting: Internal Medicine

## 2022-10-21 ENCOUNTER — Other Ambulatory Visit: Payer: Self-pay | Admitting: Family

## 2022-10-27 NOTE — Telephone Encounter (Signed)
Pt is calling and [facility] per pt does not have refill please resend

## 2022-11-01 ENCOUNTER — Ambulatory Visit: Payer: PPO | Admitting: Internal Medicine

## 2022-11-24 ENCOUNTER — Other Ambulatory Visit: Payer: Self-pay | Admitting: Internal Medicine

## 2022-11-28 ENCOUNTER — Other Ambulatory Visit: Payer: Self-pay | Admitting: Internal Medicine

## 2022-11-28 NOTE — Telephone Encounter (Signed)
Prescription Request  11/28/2022  LOV: 08/30/2022  What is the name of the medication or equipment?  risedronate (ACTONEL) 35 MG tablet zolpidem (AMBIEN) 5 MG tablet       gabapentin (NEURONTIN) 300 MG capsule  Have you contacted your pharmacy to request a refill? No   Which pharmacy would you like this sent to? [facility] [address] [place] ,[place] [address] Patient notified that their request is being sent to the clinical staff for review and that they should receive a response within 2 business days.  Pt stated she want a 90 day supple on all of these medications Please advise at Mobile [phone] (mobile)

## 2022-11-29 MED ORDER — RISEDRONATE SODIUM 35 MG PO TABS: ORAL_TABLET | ORAL | 3 refills | Status: DC

## 2022-11-29 MED ORDER — ZOLPIDEM TARTRATE 5 MG PO TABS: ORAL_TABLET | ORAL | 2 refills | Status: DC

## 2022-11-29 MED ORDER — GABAPENTIN 300 MG PO CAPS: ORAL_CAPSULE | ORAL | 1 refills | Status: DC

## 2022-11-29 NOTE — Telephone Encounter (Signed)
Spoke to pt. Pt is aware her Rx was sent.

## 2022-12-10 DIAGNOSIS — K2100 Gastro-esophageal reflux disease with esophagitis, without bleeding: Secondary | ICD-10-CM | POA: Diagnosis not present

## 2022-12-22 ENCOUNTER — Telehealth: Payer: Self-pay | Admitting: Internal Medicine

## 2022-12-22 NOTE — Telephone Encounter (Signed)
Prescription Request  12/22/2022  LOV: 08/30/2022  What is the name of the medication or equipment? DULoxetine (CYMBALTA) 30 MG capsule   Have you contacted your pharmacy to request a refill? No   Which pharmacy would you like this sent to?  [id number] - [place], [place] - [address] Phone: [phone]  Fax: [phone]     Patient notified that their request is being sent to the clinical staff for review and that they should receive a response within 2 business days.   Please advise at Mobile [phone] (mobile)

## 2022-12-29 ENCOUNTER — Telehealth: Payer: Self-pay

## 2022-12-29 NOTE — Telephone Encounter (Signed)
Pt call back and stated if medication can't be sent today she want it sent to  [id number] - [place], [place] - [address] and not [facility] at [employer]. Phone: [phone]  Fax: [phone]

## 2022-12-29 NOTE — Telephone Encounter (Signed)
Seen updated message today. Attempted to reach pt to confirm which pharmacy to send refill to. Left a voicemail to call us back.

## 2022-12-30 DIAGNOSIS — K2100 Gastro-esophageal reflux disease with esophagitis, without bleeding: Secondary | ICD-10-CM | POA: Diagnosis not present

## 2022-12-30 MED ORDER — DULOXETINE HCL 30 MG PO CPEP: 30.0000 mg | ORAL_CAPSULE | Freq: Every day | ORAL | 1 refills | Status: DC

## 2022-12-30 NOTE — Telephone Encounter (Signed)
Rx sent 
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
## 2023-01-03 ENCOUNTER — Ambulatory Visit (INDEPENDENT_AMBULATORY_CARE_PROVIDER_SITE_OTHER): Payer: PPO | Admitting: Internal Medicine

## 2023-01-03 ENCOUNTER — Encounter: Payer: Self-pay | Admitting: Internal Medicine

## 2023-01-03 VITALS — BP 140/80 | HR 72 | Temp 98.0°F | Ht 59.5 in | Wt 127.2 lb

## 2023-01-03 DIAGNOSIS — Z8709 Personal history of other diseases of the respiratory system: Secondary | ICD-10-CM

## 2023-01-03 DIAGNOSIS — G609 Hereditary and idiopathic neuropathy, unspecified: Secondary | ICD-10-CM | POA: Diagnosis not present

## 2023-01-03 DIAGNOSIS — K219 Gastro-esophageal reflux disease without esophagitis: Secondary | ICD-10-CM | POA: Diagnosis not present

## 2023-01-03 DIAGNOSIS — M810 Age-related osteoporosis without current pathological fracture: Secondary | ICD-10-CM | POA: Diagnosis not present

## 2023-01-03 DIAGNOSIS — Z79899 Other long term (current) drug therapy: Secondary | ICD-10-CM | POA: Diagnosis not present

## 2023-01-03 NOTE — Patient Instructions (Addendum)
If increasing phelgm contact us for antibiotic  add on because of you bronchiectasis   and hx of pneumonia   ( Augmentin) . Maybe hold off on actonel for 1-2 weeks  and then restart if all ok .   Stay on the meds given  for the reflux.   Plan fu  if in area  3-4 months

## 2023-01-10 DIAGNOSIS — M25511 Pain in right shoulder: Secondary | ICD-10-CM | POA: Diagnosis not present

## 2023-01-10 DIAGNOSIS — M25512 Pain in left shoulder: Secondary | ICD-10-CM | POA: Diagnosis not present

## 2023-01-11 ENCOUNTER — Other Ambulatory Visit: Payer: Self-pay | Admitting: Internal Medicine

## 2023-01-30 ENCOUNTER — Other Ambulatory Visit: Payer: Self-pay | Admitting: Family

## 2023-01-30 ENCOUNTER — Encounter: Payer: Self-pay | Admitting: Neurology

## 2023-01-30 ENCOUNTER — Ambulatory Visit: Payer: PPO | Admitting: Neurology

## 2023-01-30 VITALS — BP 138/76 | HR 68 | Ht 59.5 in | Wt 126.0 lb

## 2023-01-30 DIAGNOSIS — M5417 Radiculopathy, lumbosacral region: Secondary | ICD-10-CM | POA: Diagnosis not present

## 2023-01-30 DIAGNOSIS — G629 Polyneuropathy, unspecified: Secondary | ICD-10-CM | POA: Diagnosis not present

## 2023-01-30 NOTE — Progress Notes (Signed)
Follow-up Visit   Date: 01/30/23   Julie Bonilla MRN: [id number] DOB: 07-08-1940   Interim History: Julie Bonilla is a 82 y.o. left-handed Caucasian female with depression, insomnia, hypertension, and neuropathy returning to the clinic for follow-up of right leg pain and neuropathy.  The patient was accompanied to the clinic by self.  IMPRESSION/PLAN: Lumbar spondylosis with right leg radicular pain - Start PT for low back strengthening and stretching  - Continue tizanidine 2mg  at bedtime prn  2.  Idiopathic peripheral neuropathy, stable and pain is well-controlled on current medications  - Continue gabapentin 300mg  in the morning and 600mg  at bedtime + Cymbalta 60mg /d - prescribed by PCP  - Fall precautions discussed  - Encouraged to use a cane when travelling  Return to clinic in 6 months  ------------------------------------------------- History of present illness: Starting around 2021, she was having a tight sensation over the left lower leg and foot, is if it was in a collar.  It involves her lower leg towards her ankle.  Symptoms are constant.  No worsening with rest or activity. She does not have these symptoms in the right leg.  She has chronic low back pain, denies radicular pain.  She has painful neuropathy in the feet and says that this is very different. Pain is controlled with gabapentin 300mg  in the morning and 600mg  at bedtime and Cymbalta 60mg .   UPDATE 08/13/2021:  She is here for follow-up visit.  Neuropathy has been stable and well-controlled on gabapentin 300mg  and 600mg  bedtime + Cymbalta 60mg  daily.  She continues to have episodic tight sensation around the left lower leg and ankle, usually with prolonged standing.  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  PT was offered, but she was having upcoming knee surgery and needed to focus on that. Unfortunately, following her knee surgery she fractured her patella.  She is  followed by orthopedics.Her balance is slightly worse, she walks unassisted.   UPDATE 09/27/2022:  She is here for follow-up visit.  She reports having worsening back pain in the low back region as well as between her shoulders. No shooting pain down her legs or weakness.  Her neuropathy is stable and controlled on gabapentin 900mg /d and Cymbalta 90mg /d.  She would like to try PT for her back pain.  She lives at home with her twin sister.   UPDATE 01/30/2023:  She is here for follow-up visit.  She reports having low back pain which is radiating down her right leg.  She has symptoms less in the left leg.  PT was ordered at her last visit at [facility], however, she was told that they had a 6 month wait.  She continues to take gabapentin 900mg /d and Cymbala 90mg /d for neuropathy which is stable.    Medications:  Current Outpatient Medications on File Prior to Visit  Medication Sig Dispense Refill   amLODipine (NORVASC) 5 MG tablet TAKE ONE TABLET BY MOUTH ONCE A DAY 90 tablet 0   B Complex-C (SUPER B COMPLEX PO) Take 1 tablet by mouth daily.     Calcium Carb-Cholecalciferol (CALCIUM + D3 PO) Take 1 tablet by mouth in the morning and at bedtime. Calcium Citrate     Coenzyme Q10 (CO Q 10 PO) Take 1 tablet by mouth at bedtime.     DULoxetine (CYMBALTA) 30 MG capsule Take 1 capsule (30 mg total) by mouth daily. 90 capsule 1   DULoxetine (CYMBALTA) 60 MG capsule TAKE ONE CAPSULE BY  MOUTH TWICE DAILY 180 capsule 0   ezetimibe (ZETIA) 10 MG tablet TAKE ONE TABLET BY MOUTH ONE TIME DAILY 90 tablet 0   gabapentin (NEURONTIN) 300 MG capsule TAKE ONE CAPSULE BY MOUTH IN THE MORNING AND TWO AT BEDTIME OR AS DIRECTED 270 capsule 1   losartan (COZAAR) 100 MG tablet TAKE ONE TABLET BY MOUTH ONE TIME DAILY 90 tablet 0   Multiple Vitamin (MULTIVITAMIN WITH MINERALS) TABS tablet Take 1 tablet by mouth daily. One-A-Day     Omeprazole Magnesium 20.6 (20 Base) MG CPDR daily as needed.     pantoprazole (PROTONIX) 40 MG tablet  Take 1 tablet every day by oral route in the morning.     Polyethyl Glycol-Propyl Glycol (LUBRICANT EYE DROPS) 0.4-0.3 % SOLN Place 1 drop into both eyes 3 (three) times daily as needed (dry/irritated eyes.).     psyllium (METAMUCIL SMOOTH TEXTURE) 28 % packet Take 1 packet by mouth daily as needed (constipation).     risedronate (ACTONEL) 35 MG tablet TAKE 1 TABLET BY MOUTH ONCE A WEEK WITH WATER ON EMPTY STOMACH (DO NOT EAT, DRINK, OR LIE DOWN FOR NEXT 30 MINUTES) 12 tablet 3   sodium chloride (OCEAN) 0.65 % SOLN nasal spray Place 1 spray into both nostrils as needed (dry nose and mouth).     sucralfate (CARAFATE) 1 g tablet Take 1 tablet by mouth 3 (three) times daily as needed.     tiZANidine (ZANAFLEX) 2 MG tablet Take 1 tablet (2 mg total) by mouth at bedtime as needed for muscle spasms. 30 tablet 3   zolpidem (AMBIEN) 5 MG tablet TAKE ONE TABLET BY MOUTH AT BEDTIME AS NEEDED FOR SLEEP 30 tablet 2   No current facility-administered medications on file prior to visit.    Allergies:  Allergies  Allergen Reactions   Crestor [Rosuvastatin] Other (See Comments)    Myalgia on 5 mg per day   Erythromycin Nausea And Vomiting    Oral  No hives rash or itching   Mobic [Meloxicam] Other (See Comments)    Moody and irritable     Vital Signs:  BP 138/76   Pulse 68   Ht 4' 11.5" (1.511 m)   Wt 126 lb (57.2 kg)   SpO2 95%   BMI 25.02 kg/m   Neurological Exam: MENTAL STATUS including orientation to time, place, person, recent and remote memory, attention span and concentration, language, and fund of knowledge is normal.  Speech is not dysarthric.  CRANIAL NERVES:   Pupils equal round and reactive to light.  Normal conjugate, extra-ocular eye movements in all directions of gaze.  No ptosis.  Face is symmetric.   MOTOR:  Motor strength is 5/5 in all extremities.  No atrophy, fasciculations or abnormal movements.  No pronator drift.  Tone is normal.    MSRs:  Reflexes are 2+/4 throughout,  except 1+/4 in the legs.  SENSORY:  Vibration reduced in the feet.  COORDINATION/GAIT:  Normal finger-to- nose-finger.   Gait is mildly wide-based, unsteady with turns, unassisted.    Data: MRI lumbar spine 09/24/2020: 1. 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 2. Moderate right L1-2 and L2-3 neural foraminal stenosis with right lateral recess narrowing at both levels. 3. No central spinal canal stenosis.    Thank you for allowing me to participate in patient's care.  If I can answer any additional questions, I would be pleased to do so.  Sincerely,    Matasha Smigelski K. Allena Katz, DO

## 2023-01-30 NOTE — Patient Instructions (Signed)
Start physical therapy for low back strengthening  Recommend that you take a cane on your upcoming travels  I will see you back in 6 months

## 2023-02-02 ENCOUNTER — Other Ambulatory Visit: Payer: Self-pay | Admitting: Family

## 2023-02-07 ENCOUNTER — Ambulatory Visit (INDEPENDENT_AMBULATORY_CARE_PROVIDER_SITE_OTHER): Payer: PPO | Admitting: Family Medicine

## 2023-02-07 ENCOUNTER — Ambulatory Visit (INDEPENDENT_AMBULATORY_CARE_PROVIDER_SITE_OTHER): Payer: PPO

## 2023-02-07 ENCOUNTER — Encounter: Payer: Self-pay | Admitting: Family Medicine

## 2023-02-07 VITALS — BP 120/70 | HR 73 | Temp 97.7°F | Wt 123.2 lb

## 2023-02-07 DIAGNOSIS — M546 Pain in thoracic spine: Secondary | ICD-10-CM

## 2023-02-07 DIAGNOSIS — M5459 Other low back pain: Secondary | ICD-10-CM | POA: Diagnosis not present

## 2023-02-07 DIAGNOSIS — M47814 Spondylosis without myelopathy or radiculopathy, thoracic region: Secondary | ICD-10-CM | POA: Diagnosis not present

## 2023-02-07 DIAGNOSIS — M79604 Pain in right leg: Secondary | ICD-10-CM | POA: Diagnosis not present

## 2023-02-07 DIAGNOSIS — M419 Scoliosis, unspecified: Secondary | ICD-10-CM | POA: Diagnosis not present

## 2023-02-07 DIAGNOSIS — M438X6 Other specified deforming dorsopathies, lumbar region: Secondary | ICD-10-CM | POA: Diagnosis not present

## 2023-02-07 DIAGNOSIS — M79605 Pain in left leg: Secondary | ICD-10-CM | POA: Diagnosis not present

## 2023-02-07 MED ORDER — METHYLPREDNISOLONE 4 MG PO TBPK: ORAL_TABLET | ORAL | 0 refills | Status: DC

## 2023-02-07 MED ORDER — EZETIMIBE 10 MG PO TABS: 10.0000 mg | ORAL_TABLET | Freq: Every day | ORAL | 0 refills | Status: DC

## 2023-02-07 NOTE — Progress Notes (Signed)
   Subjective:    Patient ID: Julie Bonilla, female    DOB: 01-Jan-1941, 82 y.o.   MRN: [id number]  HPI Here for left sided back and rib pain that started 4 days ago. No recent trauma. No SOB or cough. The pain is sharp. It is continuous but it waxes and wanes. Twisting her trunk makes the pain worse.   Review of Systems  Constitutional: Negative.   Respiratory: Negative.    Cardiovascular:  Positive for chest pain. Negative for palpitations and leg swelling.  Musculoskeletal:  Positive for back pain.  Skin:  Negative for rash.       Objective:   Physical Exam Constitutional:      General: She is not in acute distress.    Appearance: Normal appearance.  Cardiovascular:     Rate and Rhythm: Normal rate and regular rhythm.     Pulses: Normal pulses.     Heart sounds: Normal heart sounds.  Pulmonary:     Effort: Pulmonary effort is normal.     Breath sounds: Normal breath sounds.  Musculoskeletal:     Comments: She is tender at the left side of the thoracic spine and this tenderness extends around the left side just below the scapula. ROM of the thoracic spine is full   Skin:    Findings: No rash.  Neurological:     Mental Status: She is alert.           Assessment & Plan:  Thoracic pain, possibly from a pinched nerve. We will get Xrays of the thoracic spine today. Treat with a Medrol dose pack.  Gershon Crane, MD

## 2023-02-09 DIAGNOSIS — M79604 Pain in right leg: Secondary | ICD-10-CM | POA: Diagnosis not present

## 2023-02-09 DIAGNOSIS — M5459 Other low back pain: Secondary | ICD-10-CM | POA: Diagnosis not present

## 2023-02-09 DIAGNOSIS — M79605 Pain in left leg: Secondary | ICD-10-CM | POA: Diagnosis not present

## 2023-02-14 DIAGNOSIS — M79604 Pain in right leg: Secondary | ICD-10-CM | POA: Diagnosis not present

## 2023-02-14 DIAGNOSIS — M5459 Other low back pain: Secondary | ICD-10-CM | POA: Diagnosis not present

## 2023-02-14 DIAGNOSIS — M79605 Pain in left leg: Secondary | ICD-10-CM | POA: Diagnosis not present

## 2023-02-16 DIAGNOSIS — M79605 Pain in left leg: Secondary | ICD-10-CM | POA: Diagnosis not present

## 2023-02-16 DIAGNOSIS — M79604 Pain in right leg: Secondary | ICD-10-CM | POA: Diagnosis not present

## 2023-02-16 DIAGNOSIS — M5459 Other low back pain: Secondary | ICD-10-CM | POA: Diagnosis not present

## 2023-02-17 ENCOUNTER — Other Ambulatory Visit: Payer: Self-pay

## 2023-02-17 MED ORDER — LOSARTAN POTASSIUM 100 MG PO TABS: 100.0000 mg | ORAL_TABLET | Freq: Every day | ORAL | 0 refills | Status: DC

## 2023-02-21 DIAGNOSIS — M79604 Pain in right leg: Secondary | ICD-10-CM | POA: Diagnosis not present

## 2023-02-21 DIAGNOSIS — M5459 Other low back pain: Secondary | ICD-10-CM | POA: Diagnosis not present

## 2023-02-21 DIAGNOSIS — M79605 Pain in left leg: Secondary | ICD-10-CM | POA: Diagnosis not present

## 2023-02-23 DIAGNOSIS — M79604 Pain in right leg: Secondary | ICD-10-CM | POA: Diagnosis not present

## 2023-02-23 DIAGNOSIS — M5459 Other low back pain: Secondary | ICD-10-CM | POA: Diagnosis not present

## 2023-02-23 DIAGNOSIS — M79605 Pain in left leg: Secondary | ICD-10-CM | POA: Diagnosis not present

## 2023-02-28 DIAGNOSIS — M79605 Pain in left leg: Secondary | ICD-10-CM | POA: Diagnosis not present

## 2023-02-28 DIAGNOSIS — M5459 Other low back pain: Secondary | ICD-10-CM | POA: Diagnosis not present

## 2023-02-28 DIAGNOSIS — M79604 Pain in right leg: Secondary | ICD-10-CM | POA: Diagnosis not present

## 2023-03-01 ENCOUNTER — Telehealth: Payer: Self-pay | Admitting: Internal Medicine

## 2023-03-01 ENCOUNTER — Other Ambulatory Visit: Payer: Self-pay | Admitting: Internal Medicine

## 2023-03-01 ENCOUNTER — Ambulatory Visit: Payer: PPO | Admitting: Physician Assistant

## 2023-03-01 NOTE — Telephone Encounter (Signed)
Pt call and stated she want dr.Panosh to call her in some antibiotic because she need it and is coughing up Milius suff.

## 2023-03-01 NOTE — Telephone Encounter (Signed)
Spoke to pt. Offer a visit for antibiotic. Pt declined.   Pt states she will be leaving out of country on Tuesday. She is requesting if provider can send in antibiotic incase she gets sick. She is coughing occasionally with [place]etts phlegm. Denied fever, bodyache, headache, congestion.    If not, she states she will take her chance of getting sick. Please advise.

## 2023-03-01 NOTE — Telephone Encounter (Signed)
So she has known  bronchiectasis and we can add antibiotic if getting more infected looking phlegm  so if no other sx of concern will send in antibiotic . I dont have any recent sputum cultures    If she has time  have her get  updated sputum culture and gram stain   Can send in doxycycline 100 mg 1 po bid for 10 days disp 20   in interim

## 2023-03-02 ENCOUNTER — Other Ambulatory Visit: Payer: Self-pay | Admitting: Internal Medicine

## 2023-03-02 DIAGNOSIS — J479 Bronchiectasis, uncomplicated: Secondary | ICD-10-CM

## 2023-03-02 MED ORDER — DOXYCYCLINE HYCLATE 100 MG PO TABS: 100.0000 mg | ORAL_TABLET | Freq: Two times a day (BID) | ORAL | 0 refills | Status: DC

## 2023-03-02 NOTE — Telephone Encounter (Signed)
See the sputum order , please send in doxy as planned

## 2023-03-02 NOTE — Telephone Encounter (Signed)
Spoke to pt and went over provider's response.   Rx sent to pt's preference pharmacy. Inform pt she can do lab that is already order when she comes back from her trip. Verbalized understanding.

## 2023-03-02 NOTE — Progress Notes (Signed)
Spoke to [facility]. They state they do collect the specimen but it will be send out to [facility] or [facility].

## 2023-03-02 NOTE — Progress Notes (Unsigned)
I sent in order  to [facility] collect ( but please check to make sure they can process the  specimen .)

## 2023-03-06 ENCOUNTER — Other Ambulatory Visit: Payer: Self-pay | Admitting: Internal Medicine

## 2023-03-20 NOTE — Telephone Encounter (Signed)
Pharmacy called to clarify dosage for the following:  DULoxetine (CYMBALTA) 30 MG capsule  DULoxetine (CYMBALTA) 60 MG capsule  Pharmacy informed MD is OOO on Mondays. Please call back at the earliest convenience.

## 2023-03-27 ENCOUNTER — Telehealth: Payer: Self-pay

## 2023-03-27 NOTE — Telephone Encounter (Signed)
Received a new prescription request from [facility] pharmacy on Duloxetine 60mg .    In pt's chart, it was sent in on 03/07/2023 for 3 months supply.   Contacted pharmacy and spoke to [employer]. Inform her about it. She ran it through. Rx medication went thru fine. No need to resend in the Rx.

## 2023-03-29 DIAGNOSIS — M79605 Pain in left leg: Secondary | ICD-10-CM | POA: Diagnosis not present

## 2023-03-29 DIAGNOSIS — M5459 Other low back pain: Secondary | ICD-10-CM | POA: Diagnosis not present

## 2023-03-29 DIAGNOSIS — M79604 Pain in right leg: Secondary | ICD-10-CM | POA: Diagnosis not present

## 2023-03-31 DIAGNOSIS — M79605 Pain in left leg: Secondary | ICD-10-CM | POA: Diagnosis not present

## 2023-03-31 DIAGNOSIS — M5459 Other low back pain: Secondary | ICD-10-CM | POA: Diagnosis not present

## 2023-03-31 DIAGNOSIS — M79604 Pain in right leg: Secondary | ICD-10-CM | POA: Diagnosis not present

## 2023-04-03 DIAGNOSIS — D1721 Benign lipomatous neoplasm of skin and subcutaneous tissue of right arm: Secondary | ICD-10-CM | POA: Diagnosis not present

## 2023-04-03 DIAGNOSIS — L720 Epidermal cyst: Secondary | ICD-10-CM | POA: Diagnosis not present

## 2023-04-03 DIAGNOSIS — L821 Other seborrheic keratosis: Secondary | ICD-10-CM | POA: Diagnosis not present

## 2023-04-03 DIAGNOSIS — D692 Other nonthrombocytopenic purpura: Secondary | ICD-10-CM | POA: Diagnosis not present

## 2023-04-03 DIAGNOSIS — M79605 Pain in left leg: Secondary | ICD-10-CM | POA: Diagnosis not present

## 2023-04-03 DIAGNOSIS — Z85828 Personal history of other malignant neoplasm of skin: Secondary | ICD-10-CM | POA: Diagnosis not present

## 2023-04-03 DIAGNOSIS — M5459 Other low back pain: Secondary | ICD-10-CM | POA: Diagnosis not present

## 2023-04-03 DIAGNOSIS — L570 Actinic keratosis: Secondary | ICD-10-CM | POA: Diagnosis not present

## 2023-04-03 DIAGNOSIS — L738 Other specified follicular disorders: Secondary | ICD-10-CM | POA: Diagnosis not present

## 2023-04-03 DIAGNOSIS — M79604 Pain in right leg: Secondary | ICD-10-CM | POA: Diagnosis not present

## 2023-04-25 ENCOUNTER — Ambulatory Visit: Payer: PPO | Admitting: Family Medicine

## 2023-04-25 ENCOUNTER — Encounter: Payer: Self-pay | Admitting: Family Medicine

## 2023-04-25 ENCOUNTER — Encounter (INDEPENDENT_AMBULATORY_CARE_PROVIDER_SITE_OTHER): Payer: PPO | Admitting: Family Medicine

## 2023-04-25 VITALS — BP 126/80 | HR 80 | Temp 98.0°F | Resp 16 | Ht 59.5 in | Wt 124.0 lb

## 2023-04-25 DIAGNOSIS — R3129 Other microscopic hematuria: Secondary | ICD-10-CM | POA: Diagnosis not present

## 2023-04-25 DIAGNOSIS — R059 Cough, unspecified: Secondary | ICD-10-CM

## 2023-04-25 DIAGNOSIS — N390 Urinary tract infection, site not specified: Secondary | ICD-10-CM | POA: Diagnosis not present

## 2023-04-25 DIAGNOSIS — R300 Dysuria: Secondary | ICD-10-CM | POA: Diagnosis not present

## 2023-04-25 LAB — POC URINALSYSI DIPSTICK (AUTOMATED)
Bilirubin, UA: NEGATIVE
Blood, UA: POSITIVE
Glucose, UA: NEGATIVE
Ketones, UA: NEGATIVE
Nitrite, UA: NEGATIVE
Protein, UA: POSITIVE — AB
Spec Grav, UA: 1.01 (ref 1.010–1.025)
Urobilinogen, UA: 0.2 U/dL
pH, UA: 6 (ref 5.0–8.0)

## 2023-04-25 MED ORDER — AMOXICILLIN-POT CLAVULANATE 875-125 MG PO TABS: 1.0000 | ORAL_TABLET | Freq: Two times a day (BID) | ORAL | 0 refills | Status: DC
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
## 2023-04-25 NOTE — Progress Notes (Signed)
Error

## 2023-04-25 NOTE — Patient Instructions (Addendum)
A few things to remember from today's visit:  Dysuria - Plan: POCT Urinalysis Dipstick (Automated), Culture, Urine  Other microscopic hematuria - Plan: Culture, Urine, Urine Microscopic Only  Cough, unspecified type  Urinary tract infection without hematuria, site unspecified - Plan: amoxicillin-clavulanate (AUGMENTIN) 875-125 MG tablet Today and antibiotic that covers respiratory tract infections and urinary tract infection was sent to your pharmacy. Antibiotic can cause diarrhea, so start a over-the-counter probiotic, align 1 capsule daily is a good option. We may need to change treatment depending of urine culture. Over-the-counter plain Mucinex may help with cough.  If you need refills for medications you take chronically, please call your pharmacy. Do not use My Chart to request refills or for acute issues that need immediate attention. If you send a my chart message, it may take a few days to be addressed, specially if I am not in the office.  Please be sure medication list is accurate. If a new problem present, please set up appointment sooner than planned today.

## 2023-04-26 LAB — URINALYSIS, MICROSCOPIC ONLY

## 2023-04-26 LAB — UNLABELED: Test Ordered On Req: 395

## 2023-04-28 ENCOUNTER — Telehealth: Payer: Self-pay | Admitting: Internal Medicine

## 2023-04-28 MED ORDER — SULFAMETHOXAZOLE-TRIMETHOPRIM 800-160 MG PO TABS: 1.0000 | ORAL_TABLET | Freq: Two times a day (BID) | ORAL | 0 refills | Status: AC

## 2023-04-28 NOTE — Telephone Encounter (Signed)
I left pt a voicemail letting her know new Rx has been sent in.

## 2023-04-28 NOTE — Addendum Note (Signed)
Addended by: Kathreen Devoid on: 04/28/2023 04:28 PM   Modules accepted: Orders

## 2023-04-28 NOTE — Telephone Encounter (Signed)
If diarrhea is severe stop medication. Bactrim DS bid x 3 days. Thanks, BJ

## 2023-04-28 NOTE — Telephone Encounter (Signed)
States amoxicillin-clavulanate (AUGMENTIN) 875-125 MG tablet is giving her terrible diarrhea. Requesting something else    [id number] - [place], [place] - [address] AT [facility] [address] Phone: [phone]  Fax: [phone]

## 2023-05-03 ENCOUNTER — Other Ambulatory Visit: Payer: Self-pay

## 2023-05-03 LAB — PAT ID TIQ DOC: Test Affected: 395

## 2023-05-03 LAB — URINE CULTURE
MICRO NUMBER:: 15774229
SPECIMEN QUALITY:: ADEQUATE

## 2023-05-03 MED ORDER — NITROFURANTOIN MONOHYD MACRO 100 MG PO CAPS: 100.0000 mg | ORAL_CAPSULE | Freq: Two times a day (BID) | ORAL | 0 refills | Status: AC

## 2023-05-05 ENCOUNTER — Other Ambulatory Visit: Payer: Self-pay | Admitting: Family Medicine

## 2023-05-08 NOTE — Telephone Encounter (Signed)
Prescription Request  05/08/2023  LOV: 02/07/2023  What is the name of the medication or equipment?     zolpidem (AMBIEN) 5 MG tablet  Have you contacted your pharmacy to request a refill? Yes   Which pharmacy would you like this sent to?[id number] - [place], [place] - [address] Phone: [phone]  Fax: [phone]    Patient notified that their request is being sent to the clinical staff for review and that they should receive a response within 2 business days.   Please advise at Mobile [phone] (mobile)

## 2023-05-11 ENCOUNTER — Other Ambulatory Visit: Payer: Self-pay | Admitting: Internal Medicine

## 2023-05-11 MED ORDER — ZOLPIDEM TARTRATE 5 MG PO TABS: ORAL_TABLET | ORAL | 1 refills | Status: DC

## 2023-05-11 NOTE — Telephone Encounter (Signed)
Spoke to pt. Pt is aware.

## 2023-05-11 NOTE — Telephone Encounter (Signed)
Prescription Request  05/11/2023  LOV: 01/03/2023  What is the name of the medication or equipment?   zolpidem (AMBIEN) 5 MG tablet  Pt states she is completely out. Please refill at your earliest convenience.     Have you contacted your pharmacy to request a refill? No   Which pharmacy would you like this sent to?    [id number] - [place], [place] - [address] Phone: [phone]  Fax: [phone]     Patient notified that their request is being sent to the clinical staff for review and that they should receive a response within 2 business days.   Please advise at Mobile [phone] (mobile)

## 2023-05-18 ENCOUNTER — Ambulatory Visit: Payer: PPO | Admitting: Family Medicine

## 2023-05-18 DIAGNOSIS — Z96652 Presence of left artificial knee joint: Secondary | ICD-10-CM | POA: Diagnosis not present

## 2023-05-18 DIAGNOSIS — Z0000 Encounter for general adult medical examination without abnormal findings: Secondary | ICD-10-CM | POA: Diagnosis not present
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
## 2023-05-18 NOTE — Patient Instructions (Signed)
I really enjoyed getting to talk with you today! I am available on Tuesdays and Thursdays for virtual visits if you have any questions or concerns, or if I can be of any further assistance.   CHECKLIST FROM ANNUAL WELLNESS VISIT:  -Follow up (please call to schedule if not scheduled after visit):   -yearly for annual wellness visit with primary care office  Here is a list of your preventive care/health maintenance measures and the plan for each if any are due:  PLAN For any measures below that may be due:  Let us know if you wish to do a bone density test  Health Maintenance  Topic Date Due   COVID-19 Vaccine (7 - 2024-25 season) 06/14/2023   Medicare Annual Wellness (AWV)  05/17/2024   DTaP/Tdap/Td (5 - Td or Tdap) 08/07/2028   Pneumonia Vaccine 74+ Years old  Completed   INFLUENZA VACCINE  Completed   DEXA SCAN  Completed   Zoster Vaccines- Shingrix  Completed   HPV VACCINES  Aged Out   Colonoscopy  Discontinued    -See a dentist at least yearly  -Get your eyes checked and then per your eye specialist's recommendations  -Other issues addressed today:   -I have included below further information regarding a healthy whole foods based diet, physical activity guidelines for adults, stress management and opportunities for social connections. I hope you find this information useful.   -----------------------------------------------------------------------------------------------------------------------------------------------------------------------------------------------------------------------------------------------------------  NUTRITION: -eat real food: lots of colorful vegetables (half the plate) and fruits -5-7 servings of vegetables and fruits per day (fresh or steamed is best), exp. 2 servings of vegetables with lunch and dinner and 2 servings of fruit per day. Berries and greens such as kale and collards are great choices.  -consume on a regular basis: whole grains  (make sure first ingredient on label contains the word "whole"), fresh fruits, fish, nuts, seeds, healthy oils (such as olive oil, avocado oil, grape seed oil) -may eat small amounts of dairy and lean meat on occasion, but avoid processed meats such as ham, bacon, lunch meat, etc. -drink water -try to avoid fast food and pre-packaged foods, processed meat -most experts advise limiting sodium to < 2300mg  per day, should limit further is any chronic conditions such as high blood pressure, heart disease, diabetes, etc. The American Heart Association advised that < 1500mg  is is ideal -try to avoid foods that contain any ingredients with names you do not recognize  -try to avoid sugar/sweets (except for the natural sugar that occurs in fresh fruit) -try to avoid sweet drinks -try to avoid white rice, white bread, pasta (unless whole grain), white or yellow potatoes  EXERCISE GUIDELINES FOR ADULTS: -if you wish to increase your physical activity, do so gradually and with the approval of your doctor -STOP and seek medical care immediately if you have any chest pain, chest discomfort or trouble breathing when starting or increasing exercise  -move and stretch your body, legs, feet and arms when sitting for long periods -Physical activity guidelines for optimal health in adults: -least 150 minutes per week of aerobic exercise (can talk, but not sing) once approved by your doctor, 20-30 minutes of sustained activity or two 10 minute episodes of sustained activity every day.  -resistance training at least 2 days per week if approved by your doctor -balance exercises 3+ days per week:   Stand somewhere where you have something sturdy to hold onto if you lose balance.    1) lift up on toes, start with  5x per day and work up to 20x   2) stand and lift on leg straight out to the side so that foot is a few inches of the floor, start with 5x each side and work up to 20x each side   3) stand on one foot, start  with 5 seconds each side and work up to 20 seconds on each side  If you need ideas or help with getting more active:  -Silver sneakers https://tools.silversneakers.com  -Walk with a Doc: http://www.duncan-williams.com/  -try to include resistance (weight lifting/strength building) and balance exercises twice per week: or the following link for ideas: http://castillo-powell.com/  BuyDucts.dk  STRESS MANAGEMENT: -can try meditating, or just sitting quietly with deep breathing while intentionally relaxing all parts of your body for 5 minutes daily -if you need further help with stress, anxiety or depression please follow up with your primary doctor or contact the wonderful folks at [facility]: [phone]  SOCIAL CONNECTIONS: -options in [place] if you wish to engage in more social and exercise related activities:  -Silver sneakers https://tools.silversneakers.com  -Walk with a Doc: http://www.duncan-williams.com/  -Check out the [facility] Active Adults 50+ section on the [place] of [employer] (hiking clubs, book clubs, cards and games, chess, exercise classes, aquatic classes and much more) - see the website for details: https://www.[place].gov/departments/parks-recreation/active-adults50  -YouTube has lots of exercise videos for different ages and abilities as well  -[facility] (a variety of indoor and outdoor inperson activities for adults). 5748734757. [address]  -Virtual Online Classes (a variety of topics): see seniorplanet.org or call 8021324453  -consider volunteering at a school, hospice center, church, senior center or elsewhere

## 2023-05-19 ENCOUNTER — Other Ambulatory Visit: Payer: Self-pay | Admitting: Internal Medicine

## 2023-05-26 ENCOUNTER — Other Ambulatory Visit: Payer: Self-pay | Admitting: Internal Medicine

## 2023-05-30 ENCOUNTER — Other Ambulatory Visit: Payer: Self-pay | Admitting: Internal Medicine

## 2023-06-08 ENCOUNTER — Telehealth: Payer: Self-pay | Admitting: Internal Medicine

## 2023-06-08 NOTE — Telephone Encounter (Signed)
 Copied from CRM [id number]. Topic: Referral - Question >> Jun 06, 2023 12:12 PM Adaysia C wrote: Reason for CRM: Patient has requested her PCP to recommend chiropractors that specialize in women care. Please follow up with patients thru phone call or via text message #[phone]. The patient cannot access her MyChart.

## 2023-06-11 NOTE — Telephone Encounter (Signed)
 I dont have a name  in past I used to use integrative therapies  but they are PT  and more holistic approach . May want to check them out.

## 2023-06-12 ENCOUNTER — Other Ambulatory Visit (INDEPENDENT_AMBULATORY_CARE_PROVIDER_SITE_OTHER): Payer: PPO

## 2023-06-12 ENCOUNTER — Encounter: Payer: Self-pay | Admitting: Physician Assistant

## 2023-06-12 ENCOUNTER — Ambulatory Visit: Payer: PPO | Admitting: Physician Assistant

## 2023-06-12 VITALS — BP 110/70 | HR 76 | Ht 59.0 in | Wt 122.0 lb

## 2023-06-12 DIAGNOSIS — K219 Gastro-esophageal reflux disease without esophagitis: Secondary | ICD-10-CM

## 2023-06-12 DIAGNOSIS — R142 Eructation: Secondary | ICD-10-CM | POA: Diagnosis not present

## 2023-06-12 DIAGNOSIS — Z860101 Personal history of adenomatous and serrated colon polyps: Secondary | ICD-10-CM | POA: Diagnosis not present

## 2023-06-12 DIAGNOSIS — R110 Nausea: Secondary | ICD-10-CM

## 2023-06-12 DIAGNOSIS — Z8719 Personal history of other diseases of the digestive system: Secondary | ICD-10-CM

## 2023-06-12 DIAGNOSIS — R1013 Epigastric pain: Secondary | ICD-10-CM

## 2023-06-12 LAB — COMPREHENSIVE METABOLIC PANEL
ALT: 16 U/L (ref 0–35)
AST: 19 U/L (ref 0–37)
Albumin: 4.1 g/dL (ref 3.5–5.2)
Alkaline Phosphatase: 53 U/L (ref 39–117)
BUN: 12 mg/dL (ref 6–23)
CO2: 29 meq/L (ref 19–32)
Calcium: 8.6 mg/dL (ref 8.4–10.5)
Chloride: 104 meq/L (ref 96–112)
Creatinine, Ser: 0.65 mg/dL (ref 0.40–1.20)
GFR: 81.73 mL/min (ref >60.00)
Glucose, Bld: 95 mg/dL (ref 70–99)
Potassium: 4 meq/L (ref 3.5–5.1)
Sodium: 139 meq/L (ref 135–145)
Total Bilirubin: 0.4 mg/dL (ref 0.2–1.2)
Total Protein: 6.5 g/dL (ref 6.0–8.3)

## 2023-06-12 LAB — CBC WITH DIFFERENTIAL/PLATELET
Basophils Absolute: 0.1 10*3/uL (ref 0.0–0.1)
Basophils Relative: 0.9 % (ref 0.0–3.0)
Eosinophils Absolute: 0.1 10*3/uL (ref 0.0–0.7)
Eosinophils Relative: 1.2 % (ref 0.0–5.0)
HCT: 41.7 % (ref 36.0–46.0)
Hemoglobin: 13.7 g/dL (ref 12.0–15.0)
Lymphocytes Relative: 26.7 % (ref 12.0–46.0)
Lymphs Abs: 2.3 10*3/uL (ref 0.7–4.0)
MCHC: 32.9 g/dL (ref 30.0–36.0)
MCV: 94.3 fL (ref 78.0–100.0)
Monocytes Absolute: 0.8 10*3/uL (ref 0.1–1.0)
Monocytes Relative: 9 % (ref 3.0–12.0)
Neutro Abs: 5.3 10*3/uL (ref 1.4–7.7)
Neutrophils Relative %: 62.2 % (ref 43.0–77.0)
Platelets: 298 10*3/uL (ref 150.0–400.0)
RBC: 4.43 Mil/uL (ref 3.87–5.11)
RDW: 13.7 % (ref 11.5–15.5)
WBC: 8.5 10*3/uL (ref 4.0–10.5)

## 2023-06-12 MED ORDER — PANTOPRAZOLE SODIUM 40 MG PO TBEC: 40.0000 mg | DELAYED_RELEASE_TABLET | Freq: Two times a day (BID) | ORAL | 6 refills | Status: AC

## 2023-06-12 MED ORDER — SUCRALFATE 1 G PO TABS: 1.0000 g | ORAL_TABLET | Freq: Three times a day (TID) | ORAL | 1 refills | Status: AC | PRN
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
## 2023-06-12 NOTE — Patient Instructions (Addendum)
 _______________________________________________________  If your blood pressure at your visit was 140/90 or greater, please contact your primary care physician to follow up on this.  _______________________________________________________  If you are age 83 or older, your body mass index should be between 23-30. Your Body mass index is 24.64 kg/m. If this is out of the aforementioned range listed, please consider follow up with your Primary Care Provider.  If you are age 60 or younger, your body mass index should be between 19-25. Your Body mass index is 24.64 kg/m. If this is out of the aformentioned range listed, please consider follow up with your Primary Care Provider.   ________________________________________________________  The  GI providers would like to encourage you to use MYCHART to communicate with providers for non-urgent requests or questions.  Due to long hold times on the telephone, sending your provider a message by [facility] may be a faster and more efficient way to get a response.  Please allow 48 business hours for a response.  Please remember that this is for non-urgent requests.  _______________________________________________________  Your provider has requested that you go to the basement level for lab work before leaving today. Press B on the elevator. The lab is located at the first door on the left as you exit the elevator.  We have sent the following medications to your pharmacy for you to pick up at your convenience: Carafate   Protonix  1 tablet  2 times a day  You have been scheduled for an abdominal ultrasound at [facility] (1st floor of hospital) on 06-19-23 at 9am. Please arrive 30 minutes prior to your appointment for registration. Make certain not to have anything to eat or drink midnight prior to your appointment. Should you need to reschedule your appointment, please contact radiology at [phone]. This test typically takes about 30  minutes to perform.  You have been scheduled for an endoscopy. Please follow written instructions given to you at your visit today.  If you use inhalers (even only as needed), please bring them with you on the day of your procedure.  If you take any of the following medications, they will need to be adjusted prior to your procedure:   DO NOT TAKE 7 DAYS PRIOR TO TEST- Trulicity (dulaglutide) Ozempic, Wegovy (semaglutide) Mounjaro (tirzepatide) Bydureon Bcise (exanatide extended release)  DO NOT TAKE 1 DAY PRIOR TO YOUR TEST Rybelsus (semaglutide) Adlyxin (lixisenatide) Victoza (liraglutide) Byetta (exanatide) ___________________________________________________________________________   It was a pleasure to see you today!  Thank you for trusting me with your gastrointestinal care!

## 2023-06-14 NOTE — Telephone Encounter (Signed)
 Spoke to pt and inform her of provider's response.   Pt states she will check it out and thank Dr. Fabian Sharp for the information.

## 2023-06-16 ENCOUNTER — Encounter: Payer: Self-pay | Admitting: Internal Medicine

## 2023-06-16 ENCOUNTER — Ambulatory Visit (AMBULATORY_SURGERY_CENTER): Payer: PPO | Admitting: Internal Medicine

## 2023-06-16 VITALS — BP 147/75 | HR 62 | Temp 97.4°F | Resp 16 | Ht 59.0 in | Wt 122.0 lb

## 2023-06-16 DIAGNOSIS — K2950 Unspecified chronic gastritis without bleeding: Secondary | ICD-10-CM

## 2023-06-16 DIAGNOSIS — K219 Gastro-esophageal reflux disease without esophagitis: Secondary | ICD-10-CM | POA: Diagnosis not present

## 2023-06-16 DIAGNOSIS — K2970 Gastritis, unspecified, without bleeding: Secondary | ICD-10-CM | POA: Diagnosis not present

## 2023-06-16 DIAGNOSIS — R1013 Epigastric pain: Secondary | ICD-10-CM | POA: Diagnosis not present

## 2023-06-16 MED ORDER — SODIUM CHLORIDE 0.9 % IV SOLN: 500.0000 mL | INTRAVENOUS | Status: DC

## 2023-06-16 NOTE — Progress Notes (Signed)
 Sedate, gd SR, tolerated procedure well, VSS, report to RN

## 2023-06-16 NOTE — Progress Notes (Signed)
 History and Physical Interval Note:  06/16/2023 10:18 AM  Julie Bonilla  has presented today for endoscopic procedure(s), with the diagnosis of  Encounter Diagnoses  Name Primary?   Abdominal pain, epigastric Yes   Gastroesophageal reflux disease, unspecified whether esophagitis present   .  The various methods of evaluation and treatment have been discussed with the patient and/or family. After consideration of risks, benefits and other options for treatment, the patient has consented to  the endoscopic procedure(s).   The patient's history has been reviewed, patient examined, no change in status, stable for endoscopic procedure(s).  I have reviewed the patient's chart and labs.  Questions were answered to the patient's satisfaction.     Lupita CHARLENA Commander, MD, NOLIA

## 2023-06-16 NOTE — Progress Notes (Signed)
 Called to room to assist during endoscopic procedure.  Patient ID and intended procedure confirmed with present staff. Received instructions for my participation in the procedure from the performing physician.

## 2023-06-16 NOTE — Progress Notes (Signed)
 Pt's states no medical or surgical changes since previsit or office visit.

## 2023-06-16 NOTE — Op Note (Signed)
 [facility] Patient Name: Julie Bonilla Procedure Date: 06/16/2023 10:04 AM MRN: [id number] Endoscopist: Lupita FORBES Commander , MD, [id number] Age: 83 Referring MD:  Date of Birth: [birth year] Gender: Female Account #: [id number] Procedure:                Upper GI endoscopy Indications:              Epigastric abdominal pain, Suspected esophageal                            reflux Medicines:                Monitored Anesthesia Care Procedure:                Pre-Anesthesia Assessment:                           - Prior to the procedure, a History and Physical                            was performed, and patient medications and                            allergies were reviewed. The patient's tolerance of                            previous anesthesia was also reviewed. The risks                            and benefits of the procedure and the sedation                            options and risks were discussed with the patient.                            All questions were answered, and informed consent                            was obtained. Prior Anticoagulants: The patient has                            taken no anticoagulant or antiplatelet agents. ASA                            Grade Assessment: III - A patient with severe                            systemic disease. After reviewing the risks and                            benefits, the patient was deemed in satisfactory                            condition to undergo the procedure.  After obtaining informed consent, the endoscope was                            passed under direct vision. Throughout the                            procedure, the patient's blood pressure, pulse, and                            oxygen saturations were monitored continuously. The                            Olympus Scope SN [id number] was introduced through the                            mouth, and advanced to the second part of  duodenum.                            The upper GI endoscopy was accomplished without                            difficulty. The patient tolerated the procedure                            well. Scope In: Scope Out: Findings:                 Patchy moderate inflammation characterized by                            mottled and eythematous mucosa was found in the                            gastric antrum. Biopsies were taken with a cold                            forceps for histology. Verification of patient                            identification for the specimen was done. Estimated                            blood loss was minimal.                           The exam was otherwise without abnormality.                           The cardia and gastric fundus were normal on                            retroflexion.                           Biopsies were taken with a cold forceps in the  gastric body for histology. Verification of patient                            identification for the specimen was done. Estimated                            blood loss was minimal. Complications:            No immediate complications. Estimated Blood Loss:     Estimated blood loss was minimal. Impression:               - Gastritis. Biopsied in antrum - Sydney protocol                           - The examination was otherwise normal.                           - Biopsies were taken with a cold forceps for                            histology in the gastric body. Sydney protocol Recommendation:           - Patient has a contact number available for                            emergencies. The signs and symptoms of potential                            delayed complications were discussed with the                            patient. Return to normal activities tomorrow.                            Written discharge instructions were provided to the                            patient.                            - Resume previous diet.                           - Continue present medications. She recently had                            PPI changed to bid                           - Await pathology results.                           - Await previously scheduled ultrasound results also Lupita FORBES Commander, MD 06/16/2023 10:42:19 AM This report has been signed electronically.

## 2023-06-16 NOTE — Patient Instructions (Addendum)
 It looks like there is some inflammation in the stomach - biopsies were taken to check it out further.  I will let you know when I receive pathology results and any next steps.  Will also see what the ultrasound tells us .  I appreciate the opportunity to care for you. Julie CHARLENA Commander, MD, [facility]   Discharge instructions given. Handout on Gastritis. Resume previous medications. YOU HAD AN ENDOSCOPIC PROCEDURE TODAY AT THE [facility]:   Refer to the procedure report that was given to you for any specific questions about what was found during the examination.  If the procedure report does not answer your questions, please call your gastroenterologist to clarify.  If you requested that your care partner not be given the details of your procedure findings, then the procedure report has been included in a sealed envelope for you to review at your convenience later.  YOU SHOULD EXPECT: Some feelings of bloating in the abdomen. Passage of more gas than usual.  Walking can help get rid of the air that was put into your GI tract during the procedure and reduce the bloating. If you had a lower endoscopy (such as a colonoscopy or flexible sigmoidoscopy) you may notice spotting of blood in your stool or on the toilet paper. If you underwent a bowel prep for your procedure, you may not have a normal bowel movement for a few days.  Please Note:  You might notice some irritation and congestion in your nose or some drainage.  This is from the oxygen used during your procedure.  There is no need for concern and it should clear up in a day or so.  SYMPTOMS TO REPORT IMMEDIATELY:   Following upper endoscopy (EGD)  Vomiting of blood or coffee ground material  New chest pain or pain under the shoulder blades  Painful or persistently difficult swallowing  New shortness of breath  Fever of 100F or higher  Black, tarry-looking stools  For urgent or emergent issues, a gastroenterologist can be  reached at any hour by calling [phone]. Do not use MyChart messaging for urgent concerns.    DIET:  We do recommend a small meal at first, but then you may proceed to your regular diet.  Drink plenty of fluids but you should avoid alcoholic beverages for 24 hours.  ACTIVITY:  You should plan to take it easy for the rest of today and you should NOT DRIVE or use heavy machinery until tomorrow (because of the sedation medicines used during the test).    FOLLOW UP: Our staff will call the number listed on your records the next business day following your procedure.  We will call around 7:15- 8:00 am to check on you and address any questions or concerns that you may have regarding the information given to you following your procedure. If we do not reach you, we will leave a message.     If any biopsies were taken you will be contacted by phone or by letter within the next 1-3 weeks.  Please call us  at [phone] if you have not heard about the biopsies in 3 weeks.    SIGNATURES/CONFIDENTIALITY: You and/or your care partner have signed paperwork which will be entered into your electronic medical record.  These signatures attest to the fact that that the information above on your After Visit Summary has been reviewed and is understood.  Full responsibility of the confidentiality of this discharge information lies with you and/or your care-partner.

## 2023-06-19 ENCOUNTER — Ambulatory Visit (HOSPITAL_COMMUNITY)
Admission: RE | Admit: 2023-06-19 | Discharge: 2023-06-19 | Disposition: A | Discharge status: Home / Self Care | Payer: PPO | Source: Ambulatory Visit | Attending: Physician Assistant | Admitting: Physician Assistant

## 2023-06-19 ENCOUNTER — Telehealth: Payer: Self-pay

## 2023-06-19 DIAGNOSIS — R1013 Epigastric pain: Secondary | ICD-10-CM | POA: Diagnosis not present

## 2023-06-19 DIAGNOSIS — R110 Nausea: Secondary | ICD-10-CM | POA: Diagnosis not present

## 2023-06-19 DIAGNOSIS — K219 Gastro-esophageal reflux disease without esophagitis: Secondary | ICD-10-CM | POA: Insufficient documentation

## 2023-06-19 DIAGNOSIS — R142 Eructation: Secondary | ICD-10-CM | POA: Diagnosis not present

## 2023-06-19 NOTE — Telephone Encounter (Signed)
 No answer, left message to call if having any issues or concerns, B.Vale Mousseau RN

## 2023-06-20 LAB — SURGICAL PATHOLOGY

## 2023-06-27 DIAGNOSIS — M25562 Pain in left knee: Secondary | ICD-10-CM | POA: Diagnosis not present

## 2023-06-30 DIAGNOSIS — M25562 Pain in left knee: Secondary | ICD-10-CM | POA: Diagnosis not present

## 2023-07-04 DIAGNOSIS — M25562 Pain in left knee: Secondary | ICD-10-CM | POA: Diagnosis not present

## 2023-07-07 DIAGNOSIS — M25562 Pain in left knee: Secondary | ICD-10-CM | POA: Diagnosis not present

## 2023-07-10 ENCOUNTER — Other Ambulatory Visit: Payer: Self-pay | Admitting: Internal Medicine

## 2023-07-11 DIAGNOSIS — M25562 Pain in left knee: Secondary | ICD-10-CM | POA: Diagnosis not present

## 2023-07-15 ENCOUNTER — Other Ambulatory Visit: Payer: Self-pay | Admitting: Family

## 2023-07-17 DIAGNOSIS — Z1231 Encounter for screening mammogram for malignant neoplasm of breast: Secondary | ICD-10-CM | POA: Diagnosis not present

## 2023-07-17 LAB — HM MAMMOGRAPHY

## 2023-07-18 ENCOUNTER — Encounter: Payer: Self-pay | Admitting: Internal Medicine

## 2023-07-18 DIAGNOSIS — M25562 Pain in left knee: Secondary | ICD-10-CM | POA: Diagnosis not present

## 2023-07-20 DIAGNOSIS — M25562 Pain in left knee: Secondary | ICD-10-CM | POA: Diagnosis not present

## 2023-07-20 DIAGNOSIS — Z96652 Presence of left artificial knee joint: Secondary | ICD-10-CM | POA: Diagnosis not present

## 2023-07-20 DIAGNOSIS — M7652 Patellar tendinitis, left knee: Secondary | ICD-10-CM | POA: Diagnosis not present

## 2023-07-24 DIAGNOSIS — M25562 Pain in left knee: Secondary | ICD-10-CM | POA: Diagnosis not present

## 2023-07-25 DIAGNOSIS — Z961 Presence of intraocular lens: Secondary | ICD-10-CM | POA: Diagnosis not present

## 2023-07-25 DIAGNOSIS — H35363 Drusen (degenerative) of macula, bilateral: Secondary | ICD-10-CM | POA: Diagnosis not present

## 2023-07-25 DIAGNOSIS — H52203 Unspecified astigmatism, bilateral: Secondary | ICD-10-CM | POA: Diagnosis not present

## 2023-07-26 ENCOUNTER — Other Ambulatory Visit: Payer: Self-pay | Admitting: Internal Medicine

## 2023-07-26 NOTE — Telephone Encounter (Unsigned)
 Copied from CRM [id number]. Topic: Clinical - Medication Refill >> Jul 26, 2023 11:20 AM Pascal Lux wrote: Most Recent Primary Care Visit:  Provider: Terressa Koyanagi  Department: [facility]  Visit Type: MEDICARE AWV, SEQUENTIAL  Date: 05/18/2023  Medication: amLODipine (NORVASC) 5 MG tablet [[id number]]  Has the patient contacted their pharmacy? Yes (Agent: If no, request that the patient contact the pharmacy for the refill. If patient does not wish to contact the pharmacy document the reason why and proceed with request.) (Agent: If yes, when and what did the pharmacy advise?) contact provider.  Is this the correct pharmacy for this prescription? Yes If no, delete pharmacy and type the correct one.  This is the patient's preferred pharmacy:   [id number] - [place], [place] - [address] AT [facility] [address] [facility] [address] [place] [address] Phone: [phone] Fax: [phone]   Has the prescription been filled recently? No  Is the patient out of the medication? Yes  Has the patient been seen for an appointment in the last year OR does the patient have an upcoming appointment? Yes  Can we respond through MyChart? No  Agent: Please be advised that Rx refills may take up to 3 business days. We ask that you follow-up with your pharmacy.

## 2023-07-27 DIAGNOSIS — M25562 Pain in left knee: Secondary | ICD-10-CM | POA: Diagnosis not present

## 2023-07-28 MED ORDER — AMLODIPINE BESYLATE 5 MG PO TABS: 5.0000 mg | ORAL_TABLET | Freq: Every day | ORAL | 0 refills | Status: DC

## 2023-08-03 ENCOUNTER — Other Ambulatory Visit: Payer: Self-pay | Admitting: Internal Medicine

## 2023-08-03 ENCOUNTER — Other Ambulatory Visit: Payer: Self-pay | Admitting: Family

## 2023-08-03 DIAGNOSIS — M9904 Segmental and somatic dysfunction of sacral region: Secondary | ICD-10-CM | POA: Diagnosis not present

## 2023-08-03 DIAGNOSIS — M9902 Segmental and somatic dysfunction of thoracic region: Secondary | ICD-10-CM | POA: Diagnosis not present

## 2023-08-03 DIAGNOSIS — M9903 Segmental and somatic dysfunction of lumbar region: Secondary | ICD-10-CM | POA: Diagnosis not present

## 2023-08-03 DIAGNOSIS — M9901 Segmental and somatic dysfunction of cervical region: Secondary | ICD-10-CM | POA: Diagnosis not present

## 2023-08-03 NOTE — Telephone Encounter (Signed)
 Copied from CRM [id number]. Topic: Clinical - Medication Refill >> Aug 03, 2023  9:12 AM Ferdie Ping wrote: Most Recent Primary Care Visit:  Provider: Terressa Koyanagi  Department: [facility]  Visit Type: MEDICARE AWV, SEQUENTIAL  Date: 05/18/2023  Medication: gabapentin (NEURONTIN) 300 MG capsule  Has the patient contacted their pharmacy? Yes (Agent: If no, request that the patient contact the pharmacy for the refill. If patient does not wish to contact the pharmacy document the reason why and proceed with request.) (Agent: If yes, when and what did the pharmacy advise?)  Is this the correct pharmacy for this prescription? Yes If no, delete pharmacy and type the correct one.  This is the patient's preferred pharmacy:  [facility] [address] [place] - [address] [place] [address] Phone: [phone] Fax: [phone]   Has the prescription been filled recently? No  Is the patient out of the medication? No  Has the patient been seen for an appointment in the last year OR does the patient have an upcoming appointment? Yes  Can we respond through MyChart? Yes  Agent: Please be advised that Rx refills may take up to 3 business days. We ask that you follow-up with your pharmacy.

## 2023-08-04 MED ORDER — GABAPENTIN 300 MG PO CAPS: ORAL_CAPSULE | ORAL | 1 refills | Status: DC

## 2023-08-07 ENCOUNTER — Ambulatory Visit: Payer: PPO | Admitting: Neurology

## 2023-08-07 DIAGNOSIS — M9902 Segmental and somatic dysfunction of thoracic region: Secondary | ICD-10-CM | POA: Diagnosis not present

## 2023-08-07 DIAGNOSIS — M9904 Segmental and somatic dysfunction of sacral region: Secondary | ICD-10-CM | POA: Diagnosis not present

## 2023-08-07 DIAGNOSIS — M9903 Segmental and somatic dysfunction of lumbar region: Secondary | ICD-10-CM | POA: Diagnosis not present

## 2023-08-07 DIAGNOSIS — M9901 Segmental and somatic dysfunction of cervical region: Secondary | ICD-10-CM | POA: Diagnosis not present
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
## 2023-08-09 ENCOUNTER — Other Ambulatory Visit: Payer: Self-pay

## 2023-08-09 ENCOUNTER — Telehealth (INDEPENDENT_AMBULATORY_CARE_PROVIDER_SITE_OTHER): Payer: PPO | Admitting: Internal Medicine

## 2023-08-09 ENCOUNTER — Encounter: Payer: Self-pay | Admitting: Internal Medicine

## 2023-08-09 DIAGNOSIS — M9904 Segmental and somatic dysfunction of sacral region: Secondary | ICD-10-CM | POA: Diagnosis not present

## 2023-08-09 DIAGNOSIS — M9901 Segmental and somatic dysfunction of cervical region: Secondary | ICD-10-CM | POA: Diagnosis not present

## 2023-08-09 DIAGNOSIS — M9902 Segmental and somatic dysfunction of thoracic region: Secondary | ICD-10-CM | POA: Diagnosis not present

## 2023-08-09 DIAGNOSIS — M9903 Segmental and somatic dysfunction of lumbar region: Secondary | ICD-10-CM | POA: Diagnosis not present

## 2023-08-09 MED ORDER — ZOLPIDEM TARTRATE 5 MG PO TABS: 5.0000 mg | ORAL_TABLET | Freq: Every evening | ORAL | 0 refills | Status: DC | PRN

## 2023-08-09 MED ORDER — LOSARTAN POTASSIUM 100 MG PO TABS: 100.0000 mg | ORAL_TABLET | Freq: Every day | ORAL | 0 refills | Status: DC

## 2023-08-09 NOTE — Telephone Encounter (Signed)
 Attempted to start video visit with pt today. It was unsuccessful. Pt was unable to get access to camera and audio. Had to move her appt.   Spoke to provider and inform her what pt shared: sx been going on for about 2 months and with Ambien 5mg  at night- only sleep for 4 hours. Pt shared her sister had stroke and waiting to take off tube.   Provider advise pt can take 1.5 tablet of Ambien at night with precaution and max of 2 tablet of Ambien. Only temporarily. And to schedule an appt.   Contacted pt and relay provider's advise to pt. Emphasize for pt to try 1.5 tablet first if that works then try 2 tablet. Advise pt to keep [name] updated. Pt verbalized understanding.   Offer to schedule an office visit to another provider as Dr. Fabian Sharp first available is next week. Pt states she is okay to wait and see Dr. Fabian Sharp on Tuesday, 08/15/2023.   Appt is schedule.   Pt request a refill on Ambien 5mg  send to [facility].   Please advise.

## 2023-08-11 DIAGNOSIS — M9902 Segmental and somatic dysfunction of thoracic region: Secondary | ICD-10-CM | POA: Diagnosis not present

## 2023-08-11 DIAGNOSIS — M9901 Segmental and somatic dysfunction of cervical region: Secondary | ICD-10-CM | POA: Diagnosis not present

## 2023-08-11 DIAGNOSIS — M9903 Segmental and somatic dysfunction of lumbar region: Secondary | ICD-10-CM | POA: Diagnosis not present

## 2023-08-11 DIAGNOSIS — M9904 Segmental and somatic dysfunction of sacral region: Secondary | ICD-10-CM | POA: Diagnosis not present

## 2023-08-14 DIAGNOSIS — M9903 Segmental and somatic dysfunction of lumbar region: Secondary | ICD-10-CM | POA: Diagnosis not present

## 2023-08-14 DIAGNOSIS — M9901 Segmental and somatic dysfunction of cervical region: Secondary | ICD-10-CM | POA: Diagnosis not present

## 2023-08-14 DIAGNOSIS — M9904 Segmental and somatic dysfunction of sacral region: Secondary | ICD-10-CM | POA: Diagnosis not present

## 2023-08-14 DIAGNOSIS — M9902 Segmental and somatic dysfunction of thoracic region: Secondary | ICD-10-CM | POA: Diagnosis not present

## 2023-08-15 ENCOUNTER — Ambulatory Visit (INDEPENDENT_AMBULATORY_CARE_PROVIDER_SITE_OTHER): Admitting: Internal Medicine

## 2023-08-15 ENCOUNTER — Encounter: Payer: Self-pay | Admitting: Internal Medicine

## 2023-08-15 VITALS — BP 136/80 | HR 85 | Temp 98.0°F | Ht 59.0 in | Wt 124.8 lb

## 2023-08-15 DIAGNOSIS — J479 Bronchiectasis, uncomplicated: Secondary | ICD-10-CM | POA: Diagnosis not present

## 2023-08-15 DIAGNOSIS — R059 Cough, unspecified: Secondary | ICD-10-CM

## 2023-08-15 DIAGNOSIS — G4700 Insomnia, unspecified: Secondary | ICD-10-CM

## 2023-08-15 DIAGNOSIS — Z79899 Other long term (current) drug therapy: Secondary | ICD-10-CM

## 2023-08-15 DIAGNOSIS — I10 Essential (primary) hypertension: Secondary | ICD-10-CM | POA: Diagnosis not present

## 2023-08-15 MED ORDER — DOXYCYCLINE HYCLATE 100 MG PO TABS: 100.0000 mg | ORAL_TABLET | Freq: Two times a day (BID) | ORAL | 0 refills | Status: DC

## 2023-08-15 MED ORDER — ZOLPIDEM TARTRATE 10 MG PO TABS: 10.0000 mg | ORAL_TABLET | Freq: Every evening | ORAL | 2 refills | Status: DC | PRN
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
## 2023-08-15 NOTE — Patient Instructions (Signed)
 Lungs are clear .  If  needed can add antibiotic as planned .   Can try Ambien  10 mg for now with caution.

## 2023-08-16 ENCOUNTER — Telehealth: Payer: Self-pay

## 2023-08-16 NOTE — Telephone Encounter (Signed)
 Copied from CRM [id number]. Topic: Clinical - Prescription Issue >> Aug 15, 2023  5:06 PM [name] wrote: Reason for CRM: [name] calling in from [employer]. She has a question regarding prescription dosage (ambient 10mg )  Callback: [phone]

## 2023-08-21 NOTE — Telephone Encounter (Signed)
 Spoke to pharmacy.  Inform her for pt can finish what he has left and then pt can pick up the new Rx.   Pharmacy tech has no further questions.

## 2023-08-30 DIAGNOSIS — M9902 Segmental and somatic dysfunction of thoracic region: Secondary | ICD-10-CM | POA: Diagnosis not present

## 2023-08-30 DIAGNOSIS — M9901 Segmental and somatic dysfunction of cervical region: Secondary | ICD-10-CM | POA: Diagnosis not present

## 2023-08-30 DIAGNOSIS — M9904 Segmental and somatic dysfunction of sacral region: Secondary | ICD-10-CM | POA: Diagnosis not present

## 2023-08-30 DIAGNOSIS — M9903 Segmental and somatic dysfunction of lumbar region: Secondary | ICD-10-CM | POA: Diagnosis not present

## 2023-08-31 DIAGNOSIS — C4441 Basal cell carcinoma of skin of scalp and neck: Secondary | ICD-10-CM | POA: Diagnosis not present

## 2023-08-31 DIAGNOSIS — Z85828 Personal history of other malignant neoplasm of skin: Secondary | ICD-10-CM | POA: Diagnosis not present

## 2023-08-31 DIAGNOSIS — L820 Inflamed seborrheic keratosis: Secondary | ICD-10-CM | POA: Diagnosis not present

## 2023-08-31 DIAGNOSIS — D485 Neoplasm of uncertain behavior of skin: Secondary | ICD-10-CM | POA: Diagnosis not present

## 2023-09-01 DIAGNOSIS — M9901 Segmental and somatic dysfunction of cervical region: Secondary | ICD-10-CM | POA: Diagnosis not present

## 2023-09-01 DIAGNOSIS — M9903 Segmental and somatic dysfunction of lumbar region: Secondary | ICD-10-CM | POA: Diagnosis not present

## 2023-09-01 DIAGNOSIS — M9902 Segmental and somatic dysfunction of thoracic region: Secondary | ICD-10-CM | POA: Diagnosis not present

## 2023-09-01 DIAGNOSIS — M9904 Segmental and somatic dysfunction of sacral region: Secondary | ICD-10-CM | POA: Diagnosis not present

## 2023-09-05 ENCOUNTER — Other Ambulatory Visit: Payer: Self-pay | Admitting: Internal Medicine

## 2023-09-06 DIAGNOSIS — M9901 Segmental and somatic dysfunction of cervical region: Secondary | ICD-10-CM | POA: Diagnosis not present

## 2023-09-06 DIAGNOSIS — M9904 Segmental and somatic dysfunction of sacral region: Secondary | ICD-10-CM | POA: Diagnosis not present

## 2023-09-06 DIAGNOSIS — M9903 Segmental and somatic dysfunction of lumbar region: Secondary | ICD-10-CM | POA: Diagnosis not present

## 2023-09-06 DIAGNOSIS — M9902 Segmental and somatic dysfunction of thoracic region: Secondary | ICD-10-CM | POA: Diagnosis not present

## 2023-09-13 DIAGNOSIS — M9904 Segmental and somatic dysfunction of sacral region: Secondary | ICD-10-CM | POA: Diagnosis not present

## 2023-09-13 DIAGNOSIS — M9903 Segmental and somatic dysfunction of lumbar region: Secondary | ICD-10-CM | POA: Diagnosis not present

## 2023-09-13 DIAGNOSIS — M9902 Segmental and somatic dysfunction of thoracic region: Secondary | ICD-10-CM | POA: Diagnosis not present

## 2023-09-13 DIAGNOSIS — M9901 Segmental and somatic dysfunction of cervical region: Secondary | ICD-10-CM | POA: Diagnosis not present

## 2023-09-15 DIAGNOSIS — M9903 Segmental and somatic dysfunction of lumbar region: Secondary | ICD-10-CM | POA: Diagnosis not present

## 2023-09-15 DIAGNOSIS — M9902 Segmental and somatic dysfunction of thoracic region: Secondary | ICD-10-CM | POA: Diagnosis not present

## 2023-09-15 DIAGNOSIS — M9904 Segmental and somatic dysfunction of sacral region: Secondary | ICD-10-CM | POA: Diagnosis not present

## 2023-09-15 DIAGNOSIS — M9901 Segmental and somatic dysfunction of cervical region: Secondary | ICD-10-CM | POA: Diagnosis not present

## 2023-09-18 DIAGNOSIS — M9904 Segmental and somatic dysfunction of sacral region: Secondary | ICD-10-CM | POA: Diagnosis not present

## 2023-09-18 DIAGNOSIS — M9903 Segmental and somatic dysfunction of lumbar region: Secondary | ICD-10-CM | POA: Diagnosis not present

## 2023-09-18 DIAGNOSIS — M9901 Segmental and somatic dysfunction of cervical region: Secondary | ICD-10-CM | POA: Diagnosis not present

## 2023-09-18 DIAGNOSIS — M9902 Segmental and somatic dysfunction of thoracic region: Secondary | ICD-10-CM | POA: Diagnosis not present

## 2023-09-20 DIAGNOSIS — M9904 Segmental and somatic dysfunction of sacral region: Secondary | ICD-10-CM | POA: Diagnosis not present

## 2023-09-20 DIAGNOSIS — M9902 Segmental and somatic dysfunction of thoracic region: Secondary | ICD-10-CM | POA: Diagnosis not present

## 2023-09-20 DIAGNOSIS — M9903 Segmental and somatic dysfunction of lumbar region: Secondary | ICD-10-CM | POA: Diagnosis not present

## 2023-09-20 DIAGNOSIS — M9901 Segmental and somatic dysfunction of cervical region: Secondary | ICD-10-CM | POA: Diagnosis not present

## 2023-09-25 DIAGNOSIS — M9904 Segmental and somatic dysfunction of sacral region: Secondary | ICD-10-CM | POA: Diagnosis not present

## 2023-09-25 DIAGNOSIS — C4441 Basal cell carcinoma of skin of scalp and neck: Secondary | ICD-10-CM | POA: Diagnosis not present

## 2023-09-25 DIAGNOSIS — M9903 Segmental and somatic dysfunction of lumbar region: Secondary | ICD-10-CM | POA: Diagnosis not present

## 2023-09-25 DIAGNOSIS — M9901 Segmental and somatic dysfunction of cervical region: Secondary | ICD-10-CM | POA: Diagnosis not present

## 2023-09-25 DIAGNOSIS — M9902 Segmental and somatic dysfunction of thoracic region: Secondary | ICD-10-CM | POA: Diagnosis not present

## 2023-09-25 DIAGNOSIS — Z85828 Personal history of other malignant neoplasm of skin: Secondary | ICD-10-CM | POA: Diagnosis not present

## 2023-09-27 DIAGNOSIS — M9903 Segmental and somatic dysfunction of lumbar region: Secondary | ICD-10-CM | POA: Diagnosis not present

## 2023-09-27 DIAGNOSIS — M9904 Segmental and somatic dysfunction of sacral region: Secondary | ICD-10-CM | POA: Diagnosis not present

## 2023-09-27 DIAGNOSIS — M9901 Segmental and somatic dysfunction of cervical region: Secondary | ICD-10-CM | POA: Diagnosis not present

## 2023-09-27 DIAGNOSIS — M9902 Segmental and somatic dysfunction of thoracic region: Secondary | ICD-10-CM | POA: Diagnosis not present

## 2023-09-29 ENCOUNTER — Ambulatory Visit (INDEPENDENT_AMBULATORY_CARE_PROVIDER_SITE_OTHER): Admitting: Adult Health

## 2023-09-29 VITALS — BP 130/62 | HR 62 | Temp 99.2°F | Ht 59.0 in | Wt 124.0 lb

## 2023-09-29 DIAGNOSIS — J0110 Acute frontal sinusitis, unspecified: Secondary | ICD-10-CM | POA: Diagnosis not present

## 2023-09-29 DIAGNOSIS — R051 Acute cough: Secondary | ICD-10-CM | POA: Diagnosis not present

## 2023-09-29 DIAGNOSIS — R062 Wheezing: Secondary | ICD-10-CM

## 2023-09-29 MED ORDER — BENZONATATE 200 MG PO CAPS
200.0000 mg | ORAL_CAPSULE | Freq: Three times a day (TID) | ORAL | 0 refills | Status: AC | PRN
Start: 2023-09-29 — End: ?

## 2023-09-29 MED ORDER — DOXYCYCLINE HYCLATE 100 MG PO CAPS: 100.0000 mg | ORAL_CAPSULE | Freq: Two times a day (BID) | ORAL | 0 refills | Status: DC

## 2023-09-29 MED ORDER — PREDNISONE 10 MG PO TABS: ORAL_TABLET | ORAL | 0 refills | Status: DC
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
## 2023-10-04 ENCOUNTER — Ambulatory Visit: Payer: Self-pay

## 2023-10-04 DIAGNOSIS — D485 Neoplasm of uncertain behavior of skin: Secondary | ICD-10-CM | POA: Diagnosis not present

## 2023-10-04 DIAGNOSIS — C44329 Squamous cell carcinoma of skin of other parts of face: Secondary | ICD-10-CM | POA: Diagnosis not present

## 2023-10-04 NOTE — Telephone Encounter (Signed)
 Chief Complaint: Cough with phlegm, chest congestion Symptoms: Cough with Villacis phlegm, chest soreness, chest congestion Frequency: Since 09/29/23 Pertinent Negatives: Patient denies fever Disposition: [] ED /[] Urgent Care (no appt availability in office) / [] Appointment(In office/virtual)/ []  [facility]/ [x] Home Care/ [] Refused Recommended Disposition /[] [facility]/ [x]  Follow-up with PCP Additional Notes: Patient called in stating she has completed her Doxycycline  and Prednisone completely and is still having chest congestion with a deep cough and Canny phlegm. Patient denies wheezing and fever. Patient is asking if she needs another round of antibiotics due to not being able to get the chest congestion/phlegm up out of her chest, and states the phlegm is very thick and Kam[place]. Patient is asking for a call back from PCP staff to inform her if she should be seen again or if another round of abx will be called in, or if she should continue to watch this at home for now. Patient given home care advice. Patient call back number [phone].   Reason for Disposition  Cough with cold symptoms (e.g., runny nose, postnasal drip, throat clearing)  Answer Assessment - Initial Assessment Questions 1. ONSET: "When did the cough begin?"      09/29/23 2. SEVERITY: "How bad is the cough today?"      Coughing bad and hurting chest 3. SPUTUM: "Describe the color of your sputum" (none, dry cough; clear, white, yellow, Slovacek)     [place] 4. HEMOPTYSIS: "Are you coughing up any blood?" If so ask: "How much?" (flecks, streaks, tablespoons, etc.)     no 5. DIFFICULTY BREATHING: "Are you having difficulty breathing?" If Yes, ask: "How bad is it?" (e.g., mild, moderate, severe)    - MILD: No SOB at rest, mild SOB with walking, speaks normally in sentences, can lie down, no retractions, pulse < 100.    - MODERATE: SOB at rest, SOB with minimal exertion and prefers to sit, cannot lie down flat,  speaks in phrases, mild retractions, audible wheezing, pulse 100-120.    - SEVERE: Very SOB at rest, speaks in single words, struggling to breathe, sitting hunched forward, retractions, pulse > 120      No 6. FEVER: "Do you have a fever?" If Yes, ask: "What is your temperature, how was it measured, and when did it start?"     No 7. CARDIAC HISTORY: "Do you have any history of heart disease?" (e.g., heart attack, congestive heart failure)      No 8. LUNG HISTORY: "Do you have any history of lung disease?"  (e.g., pulmonary embolus, asthma, emphysema)     No 9. PE RISK FACTORS: "Do you have a history of blood clots?" (or: recent major surgery, recent prolonged travel, bedridden)     No 10. OTHER SYMPTOMS: "Do you have any other symptoms?" (e.g., runny nose, wheezing, chest pain)       Chest pain/soreness while coughing  Protocols used: Cough - Acute Productive-A-AH

## 2023-10-05 ENCOUNTER — Ambulatory Visit: Payer: Self-pay | Admitting: *Deleted

## 2023-10-05 NOTE — Telephone Encounter (Signed)
**Note De-identified  Woolbright Obfuscation** Please advise 

## 2023-10-05 NOTE — Telephone Encounter (Signed)
 Copied from CRM [id number]. Topic: Clinical - Pink Word Triage >> Oct 05, 2023 10:53 AM Dorisann Garre T wrote: Reason for Triage: patient is still feeling bad she is coughing still and not feeling well in the notes it says patient needs to be triage Reason for Disposition  [1] Continuous (nonstop) coughing interferes with work or school AND [2] no improvement using cough treatment per Care Advice    Seen by Randel Buss on 09/29/2023 for same issue.   Not better after completing medications.  Answer Assessment - Initial Assessment Questions 1. ONSET: "When did the cough begin?"    I returned pt's call.   Pt seen on 09/29/2023.    I was seen by [facility].   I've completed my medications but I'm still coughing a lot.   My chest is hurting when I cough and I'm having Fouty mucus coming up.   2. SEVERITY: "How bad is the cough today?"      "I'm still coughing a lot".    No sore throat or fever.   I'm still having chest congestion and a little nasal congestion.   3. SPUTUM: "Describe the color of your sputum" (none, dry cough; clear, white, yellow, Kable)     Batley mucus  4. HEMOPTYSIS: "Are you coughing up any blood?" If so ask: "How much?" (flecks, streaks, tablespoons, etc.)     Not asked 5. DIFFICULTY BREATHING: "Are you having difficulty breathing?" If Yes, ask: "How bad is it?" (e.g., mild, moderate, severe)    - MILD: No SOB at rest, mild SOB with walking, speaks normally in sentences, can lie down, no retractions, pulse < 100.    - MODERATE: SOB at rest, SOB with minimal exertion and prefers to sit, cannot lie down flat, speaks in phrases, mild retractions, audible wheezing, pulse 100-120.    - SEVERE: Very SOB at rest, speaks in single words, struggling to breathe, sitting hunched forward, retractions, pulse > 120      Chest tightness now but no wheezing or shortness of breath 6. FEVER: "Do you have a fever?" If Yes, ask: "What is your temperature, how was it measured, and when did it start?"     No  7. CARDIAC  HISTORY: "Do you have any history of heart disease?" (e.g., heart attack, congestive heart failure)      Not asked since already seen 8. LUNG HISTORY: "Do you have any history of lung disease?"  (e.g., pulmonary embolus, asthma, emphysema)     Not asked since already seen 9. PE RISK FACTORS: "Do you have a history of blood clots?" (or: recent major surgery, recent prolonged travel, bedridden)     Not asked 10. OTHER SYMPTOMS: "Do you have any other symptoms?" (e.g., runny nose, wheezing, chest pain)       Some nasal congestion.   Chest soreness with coughing.   The robitussin and Tessalon  are helping with the cough a little but "I'm still coughing a lot".     He put me on Doxycycline  because I had stitches in when I saw him and he said the Doxycycline  would help with the stitches not getting infected as well as the coughing and all.    If he is willing to call in something else for me I would like Amoxicillin  called in because that is what works best for me, now that I don't have the stitches any more.   Please call to [facility] on [address].    Last time it was called into  the wrong [facility] that is further down the street.   They were able to transfer it to this [facility] but just so they know to send to this [facility].    11. PREGNANCY: "Is there any chance you are pregnant?" "When was your last menstrual period?"       N/A due to age 83. TRAVEL: "Have you traveled out of the country in the last month?" (e.g., travel history, exposures)       Not asked  Protocols used: Cough - Acute Productive-A-AH  Chief Complaint: Seen by Alto Atta, NP on 09/29/2023.   Still coughing up Weissinger mucus and not feeling well after completing Doxycycline  and prednisone . Symptoms: Coughing up Randazzo mucus, chest is sore with coughing and chest feeling a little tight now.  Still mildly runny nose.  Frequency: Since 4/25 Pertinent Negatives: Patient denies fever, sore throat, wheezing or shortness of  breath.    Disposition: [] ED /[] Urgent Care (no appt availability in office) / [] Appointment(In office/virtual)/ []  [facility]/ [] Home Care/ [] Refused Recommended Disposition /[] [facility]/ [x]  Follow-up with PCP Additional Notes: High priority message sent. If willing to send in another antibiotic please send Amoxicillin .    "That works best for me".   Please send to the [facility] at [address].   The last time the medications were sent to the wrong [facility] further up the street.

## 2023-10-06 NOTE — Telephone Encounter (Signed)
 Was addressed by Claiborne Crew , CMA. See other note.

## 2023-10-06 NOTE — Telephone Encounter (Signed)
 Advised pt of message below and pt declined. Pt stated that her grandson graduates today and she can't miss it. Pt advised to [facility] to [facility] when she can.

## 2023-10-09 ENCOUNTER — Encounter: Payer: Self-pay | Admitting: Family Medicine

## 2023-10-09 ENCOUNTER — Ambulatory Visit (INDEPENDENT_AMBULATORY_CARE_PROVIDER_SITE_OTHER): Admitting: Family Medicine

## 2023-10-09 ENCOUNTER — Ambulatory Visit (INDEPENDENT_AMBULATORY_CARE_PROVIDER_SITE_OTHER)

## 2023-10-09 VITALS — BP 136/78 | HR 77 | Temp 98.3°F | Wt 124.0 lb

## 2023-10-09 DIAGNOSIS — R918 Other nonspecific abnormal finding of lung field: Secondary | ICD-10-CM | POA: Diagnosis not present

## 2023-10-09 DIAGNOSIS — J189 Pneumonia, unspecified organism: Secondary | ICD-10-CM | POA: Diagnosis not present

## 2023-10-09 MED ORDER — AMOXICILLIN-POT CLAVULANATE 875-125 MG PO TABS: 1.0000 | ORAL_TABLET | Freq: Two times a day (BID) | ORAL | 0 refills | Status: DC

## 2023-10-09 MED ORDER — HYDROCODONE BIT-HOMATROP MBR 5-1.5 MG/5ML PO SOLN: 5.0000 mL | ORAL | 0 refills | Status: DC | PRN

## 2023-10-09 NOTE — Progress Notes (Signed)
   Subjective:    Patient ID: Julie Bonilla, female    DOB: 1940-08-09, 83 y.o.   MRN: [id number]  HPI Here for a partially treated URI. She has had chest tightness and coughing up Losee sputum for the past 2 weeks. No fever or SOB. She was seen here on 09-29-23 and was diagnosed with bronchitis. She was treated with Doxycycline  and Prednisone . She seemed to get better for a few days, but then she got worse again. She is still coughing up Berlanga mucus, but she has a dull pain in the left side and she feels very tired.    Review of Systems  Constitutional:  Positive for fatigue. Negative for fever.  HENT:  Negative for congestion, ear pain, postnasal drip, sinus pressure and sore throat.   Eyes: Negative.   Respiratory:  Positive for cough and chest tightness. Negative for shortness of breath and wheezing.   Cardiovascular:  Positive for chest pain.       Objective:   Physical Exam Constitutional:      Appearance: Normal appearance. She is not ill-appearing.  HENT:     Right Ear: Tympanic membrane, ear canal and external ear normal.     Left Ear: Tympanic membrane, ear canal and external ear normal.     Nose: Nose normal.     Mouth/Throat:     Pharynx: Oropharynx is clear.  Eyes:     Conjunctiva/sclera: Conjunctivae normal.  Pulmonary:     Effort: Pulmonary effort is normal.     Breath sounds: Rales present. No wheezing or rhonchi.     Comments: She has rales in the left posterior base  Chest:     Chest wall: No tenderness.  Lymphadenopathy:     Cervical: No cervical adenopathy.  Neurological:     Mental Status: She is alert.           Assessment & Plan:  LLL pneumonia. We will get a CXR today. Treat with 10 days of Augmentin .  Corita Diego, MD

## 2023-10-18 DIAGNOSIS — M9904 Segmental and somatic dysfunction of sacral region: Secondary | ICD-10-CM | POA: Diagnosis not present

## 2023-10-18 DIAGNOSIS — M9901 Segmental and somatic dysfunction of cervical region: Secondary | ICD-10-CM | POA: Diagnosis not present

## 2023-10-18 DIAGNOSIS — M9903 Segmental and somatic dysfunction of lumbar region: Secondary | ICD-10-CM | POA: Diagnosis not present

## 2023-10-18 DIAGNOSIS — M9902 Segmental and somatic dysfunction of thoracic region: Secondary | ICD-10-CM | POA: Diagnosis not present

## 2023-10-23 DIAGNOSIS — M9901 Segmental and somatic dysfunction of cervical region: Secondary | ICD-10-CM | POA: Diagnosis not present

## 2023-10-23 DIAGNOSIS — M9904 Segmental and somatic dysfunction of sacral region: Secondary | ICD-10-CM | POA: Diagnosis not present

## 2023-10-23 DIAGNOSIS — M9902 Segmental and somatic dysfunction of thoracic region: Secondary | ICD-10-CM | POA: Diagnosis not present

## 2023-10-23 DIAGNOSIS — M9903 Segmental and somatic dysfunction of lumbar region: Secondary | ICD-10-CM | POA: Diagnosis not present

## 2023-10-24 ENCOUNTER — Ambulatory Visit: Payer: Self-pay

## 2023-10-24 ENCOUNTER — Other Ambulatory Visit: Payer: Self-pay | Admitting: Internal Medicine

## 2023-10-24 NOTE — Telephone Encounter (Signed)
  Chief Complaint: productive cough Symptoms: Schirmer productive cough  Frequency: x 2-3 weeks Pertinent Negatives: Patient denies fever, CP, SOB Disposition: [] ED /[] Urgent Care (no appt availability in office) / [x] Appointment(In office/virtual)/ []  [facility]/ [] Home Care/ [] Refused Recommended Disposition /[]  Mobile Bus/ []  Follow-up with PCP Additional Notes: Pt c/o productive Burkman cough x 2-3 weeks. Pt reports recently dx with PNA and has finished abx. Additionally, pt was given cough Rx that is almost complete. Pt denies any fevers, CP, or SOB. Scheduled patient per protocol on 10/26/2023. Patient verbalized understanding and to call back with worsening symptoms.      Copied from CRM [id number]. Topic: Clinical - Red Word Triage >> Oct 24, 2023 10:14 AM Janise Melia C wrote: Red Word that prompted transfer to Nurse Triage: pt was dxs w/ pneumonia after x-rays, still having bad cough for 2 weeks. Reason for Disposition  Cough has been present for > 3 weeks  Answer Assessment - Initial Assessment Questions 1. ONSET: "When did the cough begin?"      X 2 week  2. SEVERITY: "How bad is the cough today?"      Throughout the day 3. SPUTUM: "Describe the color of your sputum" (none, dry cough; clear, white, yellow, Kreutzer)     [place] 4. HEMOPTYSIS: "Are you coughing up any blood?" If so ask: "How much?" (flecks, streaks, tablespoons, etc.)     denies 5. DIFFICULTY BREATHING: "Are you having difficulty breathing?" If Yes, ask: "How bad is it?" (e.g., mild, moderate, severe)    - MILD: No SOB at rest, mild SOB with walking, speaks normally in sentences, can lie down, no retractions, pulse < 100.    - MODERATE: SOB at rest, SOB with minimal exertion and prefers to sit, cannot lie down flat, speaks in phrases, mild retractions, audible wheezing, pulse 100-120.    - SEVERE: Very SOB at rest, speaks in single words, struggling to breathe, sitting hunched forward, retractions, pulse  > 120      denies 6. FEVER: "Do you have a fever?" If Yes, ask: "What is your temperature, how was it measured, and when did it start?"     denies 7. CARDIAC HISTORY: "Do you have any history of heart disease?" (e.g., heart attack, congestive heart failure)      denies 8. LUNG HISTORY: "Do you have any history of lung disease?"  (e.g., pulmonary embolus, asthma, emphysema)     denies 9. PE RISK FACTORS: "Do you have a history of blood clots?" (or: recent major surgery, recent prolonged travel, bedridden)     denies 10. OTHER SYMPTOMS: "Do you have any other symptoms?" (e.g., runny nose, wheezing, chest pain)       denies "I've got stuff in my head and I blow my nose"  Protocols used: Cough - Acute Non-Productive-A-AH, Cough - Acute Productive-A-AH

## 2023-10-25 DIAGNOSIS — M9902 Segmental and somatic dysfunction of thoracic region: Secondary | ICD-10-CM | POA: Diagnosis not present

## 2023-10-25 DIAGNOSIS — M9901 Segmental and somatic dysfunction of cervical region: Secondary | ICD-10-CM | POA: Diagnosis not present

## 2023-10-25 DIAGNOSIS — M9904 Segmental and somatic dysfunction of sacral region: Secondary | ICD-10-CM | POA: Diagnosis not present

## 2023-10-25 DIAGNOSIS — M9903 Segmental and somatic dysfunction of lumbar region: Secondary | ICD-10-CM | POA: Diagnosis not present

## 2023-10-26 ENCOUNTER — Encounter: Payer: Self-pay | Admitting: Family Medicine

## 2023-10-26 ENCOUNTER — Ambulatory Visit (INDEPENDENT_AMBULATORY_CARE_PROVIDER_SITE_OTHER): Admitting: Family Medicine

## 2023-10-26 VITALS — BP 128/60 | HR 68 | Temp 98.0°F | Wt 128.0 lb

## 2023-10-26 DIAGNOSIS — J40 Bronchitis, not specified as acute or chronic: Secondary | ICD-10-CM

## 2023-10-26 MED ORDER — HYDROCODONE BIT-HOMATROP MBR 5-1.5 MG/5ML PO SOLN: 5.0000 mL | ORAL | 0 refills | Status: DC | PRN

## 2023-10-26 MED ORDER — AMOXICILLIN-POT CLAVULANATE 875-125 MG PO TABS
1.0000 | ORAL_TABLET | Freq: Two times a day (BID) | ORAL | 0 refills | Status: DC
Start: 2023-10-26 — End: 2024-04-11

## 2023-10-26 NOTE — Progress Notes (Signed)
   Subjective:    Patient ID: Julie Bonilla, female    DOB: March 28, 1941, 83 y.o.   MRN: [id number]  HPI Here for a recurrence of upper respiratory symptoms. She was seen here on 10-09-23 for a cough, and we heard a pneumonia in the left base. A CXR confirmed a LLL pneumonia. She was treated with 10 days of Augmentin , and she began to feel much better. Then 3 days ago she developed a ST (which she did not have before) and a dry cough. No fever or SOB.    Review of Systems  Constitutional: Negative.   HENT:  Positive for sore throat. Negative for congestion, ear pain, postnasal drip and sinus pain.   Eyes: Negative.   Respiratory:  Positive for cough. Negative for shortness of breath and wheezing.        Objective:   Physical Exam Constitutional:      Appearance: Normal appearance.  HENT:     Right Ear: Tympanic membrane, ear canal and external ear normal.     Left Ear: Tympanic membrane, ear canal and external ear normal.     Nose: Nose normal.     Mouth/Throat:     Pharynx: Posterior oropharyngeal erythema present. No oropharyngeal exudate.  Eyes:     Conjunctiva/sclera: Conjunctivae normal.  Pulmonary:     Effort: Pulmonary effort is normal.     Breath sounds: No wheezing or rales.     Comments: There are soft rhonchi in the left base consistent with a resolving pneumonia  Lymphadenopathy:     Cervical: No cervical adenopathy.  Neurological:     Mental Status: She is alert.           Assessment & Plan:  The LLL pneumonia seems to be resolving, but now she has a second URI. We will cover this with another 10 days of Augmentin . She will follow up as needed.  Corita Diego, MD

## 2023-11-01 ENCOUNTER — Other Ambulatory Visit: Payer: Self-pay | Admitting: Internal Medicine

## 2023-11-04 ENCOUNTER — Other Ambulatory Visit: Payer: Self-pay | Admitting: Internal Medicine

## 2023-11-21 ENCOUNTER — Telehealth: Payer: Self-pay

## 2023-11-21 ENCOUNTER — Other Ambulatory Visit: Payer: Self-pay | Admitting: Internal Medicine

## 2023-11-21 DIAGNOSIS — R051 Acute cough: Secondary | ICD-10-CM

## 2023-11-21 MED ORDER — LOSARTAN POTASSIUM 100 MG PO TABS: 100.0000 mg | ORAL_TABLET | Freq: Every day | ORAL | 0 refills | Status: DC

## 2023-11-21 NOTE — Telephone Encounter (Signed)
 A week of Losartan  Rx is sent.   Attempted to reach pt. Left a voicemail to call us  back.

## 2023-11-21 NOTE — Telephone Encounter (Signed)
 Spoke to pt. Inform pt Rx Losartan  sent.   Ask pt if she needs all of the medications that was dropped in. Pt states she doesn't need anything else except for Losartan . Inform pt we will declined the refill. Pt verbalized understanding.

## 2023-11-21 NOTE — Telephone Encounter (Signed)
 Copied from CRM [id number]. Topic: Clinical - Medication Question >> Nov 21, 2023 10:09 AM Juluis Ok wrote: Reason for CRM: Reason for CRM: Patient' states she is out of town and forgot to bring her medication. She is wanting to know if provider will call in a one week prescription of losartan  (COZAAR ) 100 MG tablet to :    [facility] at [address], [place], [place] [address] [phone]

## 2023-11-22 NOTE — Telephone Encounter (Signed)
 Spoke to pt. Pt states she only need Losartan  to be send in. Rx sent in per pt request. No further action is needed.

## 2023-11-30 DIAGNOSIS — Z01818 Encounter for other preprocedural examination: Secondary | ICD-10-CM | POA: Diagnosis not present

## 2023-11-30 DIAGNOSIS — C441292 Squamous cell carcinoma of skin of left lower eyelid, including canthus: Secondary | ICD-10-CM | POA: Diagnosis not present

## 2023-12-07 ENCOUNTER — Encounter: Payer: Self-pay | Admitting: Internal Medicine

## 2023-12-07 ENCOUNTER — Ambulatory Visit (INDEPENDENT_AMBULATORY_CARE_PROVIDER_SITE_OTHER): Admitting: Internal Medicine

## 2023-12-07 VITALS — BP 156/84 | HR 70 | Temp 97.8°F | Ht 59.0 in | Wt 125.8 lb

## 2023-12-07 DIAGNOSIS — R011 Cardiac murmur, unspecified: Secondary | ICD-10-CM | POA: Diagnosis not present

## 2023-12-07 DIAGNOSIS — R5383 Other fatigue: Secondary | ICD-10-CM | POA: Diagnosis not present

## 2023-12-07 DIAGNOSIS — I10 Essential (primary) hypertension: Secondary | ICD-10-CM | POA: Diagnosis not present

## 2023-12-07 DIAGNOSIS — J479 Bronchiectasis, uncomplicated: Secondary | ICD-10-CM

## 2023-12-07 DIAGNOSIS — Z79899 Other long term (current) drug therapy: Secondary | ICD-10-CM | POA: Diagnosis not present

## 2023-12-07 LAB — CBC WITH DIFFERENTIAL/PLATELET
Basophils Absolute: 0.1 10*3/uL (ref 0.0–0.1)
Basophils Relative: 1.1 % (ref 0.0–3.0)
Eosinophils Absolute: 0.3 10*3/uL (ref 0.0–0.7)
Eosinophils Relative: 4 % (ref 0.0–5.0)
HCT: 40.5 % (ref 36.0–46.0)
Hemoglobin: 13.5 g/dL (ref 12.0–15.0)
Lymphocytes Relative: 32.2 % (ref 12.0–46.0)
Lymphs Abs: 2 10*3/uL (ref 0.7–4.0)
MCHC: 33.4 g/dL (ref 30.0–36.0)
MCV: 89.9 fl (ref 78.0–100.0)
Monocytes Absolute: 0.5 10*3/uL (ref 0.1–1.0)
Monocytes Relative: 8.4 % (ref 3.0–12.0)
Neutro Abs: 3.4 10*3/uL (ref 1.4–7.7)
Neutrophils Relative %: 54.3 % (ref 43.0–77.0)
Platelets: 281 10*3/uL (ref 150.0–400.0)
RBC: 4.5 Mil/uL (ref 3.87–5.11)
RDW: 14.9 % (ref 11.5–15.5)
WBC: 6.3 10*3/uL (ref 4.0–10.5)

## 2023-12-07 LAB — BASIC METABOLIC PANEL WITH GFR
BUN: 18 mg/dL (ref 6–23)
CO2: 29 meq/L (ref 19–32)
Calcium: 9.7 mg/dL (ref 8.4–10.5)
Chloride: 104 meq/L (ref 96–112)
Creatinine, Ser: 0.79 mg/dL (ref 0.40–1.20)
GFR: 69.2 mL/min (ref >60.00)
Glucose, Bld: 106 mg/dL — ABNORMAL HIGH (ref 70–99)
Potassium: 5.7 meq/L — ABNORMAL HIGH (ref 3.5–5.1)
Sodium: 139 meq/L (ref 135–145)

## 2023-12-07 LAB — HEPATIC FUNCTION PANEL
ALT: 16 U/L (ref 0–35)
AST: 18 U/L (ref 0–37)
Albumin: 4.5 g/dL (ref 3.5–5.2)
Alkaline Phosphatase: 56 U/L (ref 39–117)
Bilirubin, Direct: 0.1 mg/dL (ref 0.0–0.3)
Total Bilirubin: 0.6 mg/dL (ref 0.2–1.2)
Total Protein: 6.8 g/dL (ref 6.0–8.3)

## 2023-12-07 LAB — HEMOGLOBIN A1C: Hgb A1c MFr Bld: 5.7 % (ref 4.6–6.5)

## 2023-12-07 LAB — BRAIN NATRIURETIC PEPTIDE: Pro B Natriuretic peptide (BNP): 33 pg/mL (ref 0.0–100.0)

## 2023-12-07 LAB — TSH: TSH: 1.65 u[IU]/mL (ref 0.35–5.50)

## 2023-12-07 NOTE — Patient Instructions (Addendum)
 Planning  echocardiogram  to check heart murmur Consider having you see cardiology depending on results .  Lab work to check for anemia other causes of fatigue. Reorder  sputum tests ( dont see result when ordered last year)  We can add a different antibiotic but  will plan pulmonary  consult fu. Also for advice .  Fu depending .after evaluation or if worse.
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
## 2023-12-12 ENCOUNTER — Other Ambulatory Visit: Payer: Self-pay

## 2023-12-14 ENCOUNTER — Other Ambulatory Visit: Payer: Self-pay | Admitting: Internal Medicine

## 2023-12-15 ENCOUNTER — Other Ambulatory Visit: Payer: Self-pay | Admitting: Internal Medicine

## 2023-12-17 ENCOUNTER — Ambulatory Visit: Payer: Self-pay | Admitting: Internal Medicine

## 2023-12-17 DIAGNOSIS — E875 Hyperkalemia: Secondary | ICD-10-CM

## 2023-12-17 NOTE — Progress Notes (Signed)
 Labs ok  except potassium shows mild elevation  can be from med side effect( losartan )  other  Make sure not taking potassium supplement.  Arrange  repeat  BMP dx elevated potassium   ( hydrated )  dont have to fast

## 2023-12-19 ENCOUNTER — Telehealth: Payer: Self-pay | Admitting: *Deleted

## 2023-12-19 DIAGNOSIS — C441292 Squamous cell carcinoma of skin of left lower eyelid, including canthus: Secondary | ICD-10-CM | POA: Diagnosis not present

## 2023-12-19 DIAGNOSIS — Z85828 Personal history of other malignant neoplasm of skin: Secondary | ICD-10-CM | POA: Diagnosis not present

## 2023-12-19 NOTE — Telephone Encounter (Signed)
 I singed form received today  ok for twilight  no CI

## 2023-12-19 NOTE — Telephone Encounter (Signed)
 Copied from CRM [id number]. Topic: General - Other >> Dec 19, 2023  8:30 AM Charlet HERO wrote: Reason for CRM: Patient is calling to get Dr approval to have twlight night used for her surgery on tomorrow with  Dr. Homer at the surgery center. Patient states that she will need to call him today.

## 2023-12-19 NOTE — Telephone Encounter (Signed)
 Copied from CRM [id number]. Topic: General - Other >> Dec 18, 2023  1:43 PM Drema MATSU wrote: Reason for CRM: Dr. Odella is calling to speak to someone today who can give clarification on clearance for patient surgery tomorrow

## 2023-12-20 ENCOUNTER — Other Ambulatory Visit: Payer: Self-pay

## 2023-12-20 DIAGNOSIS — Z481 Encounter for planned postprocedural wound closure: Secondary | ICD-10-CM | POA: Diagnosis not present

## 2023-12-20 DIAGNOSIS — S01112A Laceration without foreign body of left eyelid and periocular area, initial encounter: Secondary | ICD-10-CM | POA: Diagnosis not present

## 2023-12-20 DIAGNOSIS — L988 Other specified disorders of the skin and subcutaneous tissue: Secondary | ICD-10-CM | POA: Diagnosis not present

## 2023-12-20 DIAGNOSIS — Z85828 Personal history of other malignant neoplasm of skin: Secondary | ICD-10-CM | POA: Diagnosis not present

## 2023-12-20 DIAGNOSIS — Z483 Aftercare following surgery for neoplasm: Secondary | ICD-10-CM | POA: Diagnosis not present

## 2023-12-20 DIAGNOSIS — C441292 Squamous cell carcinoma of skin of left lower eyelid, including canthus: Secondary | ICD-10-CM | POA: Diagnosis not present

## 2023-12-22 LAB — DERMATOLOGY PATHOLOGY

## 2023-12-25 ENCOUNTER — Other Ambulatory Visit (INDEPENDENT_AMBULATORY_CARE_PROVIDER_SITE_OTHER)

## 2023-12-25 DIAGNOSIS — E875 Hyperkalemia: Secondary | ICD-10-CM

## 2023-12-25 LAB — BASIC METABOLIC PANEL WITH GFR
BUN: 19 mg/dL (ref 6–23)
CO2: 29 meq/L (ref 19–32)
Calcium: 9.6 mg/dL (ref 8.4–10.5)
Chloride: 102 meq/L (ref 96–112)
Creatinine, Ser: 0.84 mg/dL (ref 0.40–1.20)
GFR: 64.26 mL/min (ref >60.00)
Glucose, Bld: 95 mg/dL (ref 70–99)
Potassium: 4.6 meq/L (ref 3.5–5.1)
Sodium: 138 meq/L (ref 135–145)

## 2023-12-29 ENCOUNTER — Ambulatory Visit: Payer: Self-pay | Admitting: Family

## 2024-01-03 ENCOUNTER — Ambulatory Visit (HOSPITAL_COMMUNITY)
Admission: RE | Admit: 2024-01-03 | Discharge: 2024-01-03 | Disposition: A | Discharge status: Home / Self Care | Source: Ambulatory Visit | Attending: Internal Medicine | Admitting: Internal Medicine

## 2024-01-03 DIAGNOSIS — I10 Essential (primary) hypertension: Secondary | ICD-10-CM | POA: Diagnosis not present

## 2024-01-03 DIAGNOSIS — E785 Hyperlipidemia, unspecified: Secondary | ICD-10-CM | POA: Insufficient documentation

## 2024-01-03 DIAGNOSIS — I083 Combined rheumatic disorders of mitral, aortic and tricuspid valves: Secondary | ICD-10-CM | POA: Insufficient documentation

## 2024-01-03 DIAGNOSIS — R011 Cardiac murmur, unspecified: Secondary | ICD-10-CM | POA: Diagnosis not present

## 2024-01-03 DIAGNOSIS — R5383 Other fatigue: Secondary | ICD-10-CM | POA: Insufficient documentation

## 2024-01-03 LAB — ECHOCARDIOGRAM COMPLETE
Area-P 1/2: 2.19 cm2
S' Lateral: 2.8 cm

## 2024-01-08 NOTE — Progress Notes (Signed)
 Left ventricle  pumping is normal  Mild leaking aortic/mitral valve may not be clinically causing a problem  but could be causing the murmur heard . Doesn't seem like the cause of fatigue you described

## 2024-01-09 ENCOUNTER — Telehealth: Payer: Self-pay | Admitting: Internal Medicine

## 2024-01-09 NOTE — Telephone Encounter (Signed)
 Went over the echo reports with pt. Please see result note.

## 2024-01-09 NOTE — Telephone Encounter (Signed)
 Per agent pt would like a callback with her echo results

## 2024-01-16 ENCOUNTER — Other Ambulatory Visit: Payer: Self-pay | Admitting: Internal Medicine

## 2024-01-19 ENCOUNTER — Other Ambulatory Visit: Payer: Self-pay | Admitting: Internal Medicine

## 2024-01-22 DIAGNOSIS — L738 Other specified follicular disorders: Secondary | ICD-10-CM | POA: Diagnosis not present

## 2024-01-22 DIAGNOSIS — L821 Other seborrheic keratosis: Secondary | ICD-10-CM | POA: Diagnosis not present

## 2024-01-22 DIAGNOSIS — Z85828 Personal history of other malignant neoplasm of skin: Secondary | ICD-10-CM | POA: Diagnosis not present

## 2024-01-22 DIAGNOSIS — L570 Actinic keratosis: Secondary | ICD-10-CM | POA: Diagnosis not present

## 2024-01-22 DIAGNOSIS — L720 Epidermal cyst: Secondary | ICD-10-CM | POA: Diagnosis not present

## 2024-01-28 ENCOUNTER — Other Ambulatory Visit: Payer: Self-pay | Admitting: Internal Medicine

## 2024-01-29 ENCOUNTER — Encounter: Payer: Self-pay | Admitting: Neurology

## 2024-01-29 ENCOUNTER — Ambulatory Visit (INDEPENDENT_AMBULATORY_CARE_PROVIDER_SITE_OTHER): Admitting: Neurology

## 2024-01-29 VITALS — BP 165/88 | HR 80 | Ht 59.0 in | Wt 128.0 lb

## 2024-01-29 DIAGNOSIS — M542 Cervicalgia: Secondary | ICD-10-CM | POA: Diagnosis not present

## 2024-01-29 DIAGNOSIS — G629 Polyneuropathy, unspecified: Secondary | ICD-10-CM

## 2024-01-29 DIAGNOSIS — M5417 Radiculopathy, lumbosacral region: Secondary | ICD-10-CM

## 2024-01-29 MED ORDER — GABAPENTIN 300 MG PO CAPS: ORAL_CAPSULE | ORAL | 1 refills | Status: AC

## 2024-01-29 MED ORDER — TIZANIDINE HCL 2 MG PO TABS: 2.0000 mg | ORAL_TABLET | Freq: Every evening | ORAL | 5 refills | Status: AC | PRN

## 2024-01-29 NOTE — Progress Notes (Signed)
 Follow-up Visit   Date: 01/29/24   CORNIE MCCOMBER MRN: [id number] DOB: [birth year]   Interim History: Julie Bonilla is a 83 y.o. left-handed Caucasian female with depression, insomnia, hypertension, and neuropathy returning to the clinic for follow-up of right leg pain and neuropathy.  The patient was accompanied to the clinic by self.  IMPRESSION/PLAN: Cervicalgia - new.  No signs of cervical myelopathy on exam.  Suspect this is due to muscle tension Chronic low back pain. 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  It involves her lower leg towards her ankle.  Symptoms are constant.  No worsening with rest or activity. She does not have these symptoms in the right leg.  She has chronic low back pain, denies radicular pain.  She has painful neuropathy in the feet and says that this is very different. Pain is controlled with gabapentin  300mg  in the morning and 600mg  at bedtime and Cymbalta  60mg .   UPDATE 08/13/2021:  She is here for follow-up visit.  Neuropathy has been stable and well-controlled on gabapentin  300mg  and 600mg  bedtime + Cymbalta  60mg  daily.  She continues to have episodic tight sensation around the left lower leg and ankle, usually with prolonged standing.  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  PT was offered, but she was having upcoming knee surgery and needed to focus on that. Unfortunately, following her  knee surgery she fractured her patella.  She is followed by orthopedics.Her balance is slightly worse, she walks unassisted.   UPDATE 09/27/2022:  She is here for follow-up visit.  She reports having worsening back pain in the low back region as well as between her shoulders. No shooting pain down her legs or weakness.  Her neuropathy is stable and controlled on gabapentin  900mg /d and Cymbalta  90mg /d.  She would like to try PT for her back pain.  She lives at home with her twin sister.   UPDATE 01/30/2023:  She is here for follow-up visit.  She reports having low back pain which is radiating down her right leg.  She has symptoms less in the left leg.  PT was ordered at her last visit at [facility], however, she was told that they had a 6 month wait.  She continues to take gabapentin  900mg /d and Cymbala 90mg /d for neuropathy which is stable.    UPDATE 01/29/2024:  She is here for follow-up visit.   She has been having achy neck pain, which started a month ago.  She did get relief with home neck stretching and Aleve.  No numbness/tingling in to her arms.   She also continues to have episodic low back pain which is worse if she bends over too long such as when pulling weeds.  She is also having worsening pain in the legs from neuropathy.  She takes gabapentin  900mg /d and cymbalta  60mg /d.   Medications:  Current Outpatient Medications on File Prior to Visit  Medication Sig Dispense Refill   B Complex-C (SUPER B COMPLEX  PO) Take 1 tablet by mouth daily.     Calcium  Carb-Cholecalciferol (CALCIUM  + D3 PO) Take 1 tablet by mouth in the morning and at bedtime. Calcium  Citrate     Coenzyme Q10 (CO Q 10 PO) Take 1 tablet by mouth at bedtime.     DULoxetine  (CYMBALTA ) 30 MG capsule TAKE ONE CAPSULE BY MOUTH ONCE A DAY 90 capsule 0   DULoxetine  (CYMBALTA ) 60 MG capsule TAKE ONE CAPSULE BY MOUTH TWICE DAILY 180 capsule 0   ezetimibe  (ZETIA ) 10 MG tablet TAKE ONE TABLET BY MOUTH ONCE A DAY 90 tablet 0   gabapentin   (NEURONTIN ) 300 MG capsule TAKE 1 CAPSULE BY MOUTH IN THE MORNING AND 2 CAPSULES AT BEDTIME OR AS INSTRUCTED 270 capsule 1   losartan  (COZAAR ) 100 MG tablet Take 1 tablet (100 mg total) by mouth daily. 7 tablet 0   Multiple Vitamin (MULTIVITAMIN WITH MINERALS) TABS tablet Take 1 tablet by mouth daily. One-A-Day     pantoprazole  (PROTONIX ) 40 MG tablet Take 1 tablet (40 mg total) by mouth 2 (two) times daily. 60 tablet 6   Polyethyl Glycol-Propyl Glycol (LUBRICANT EYE DROPS) 0.4-0.3 % SOLN Place 1 drop into both eyes 3 (three) times daily as needed (dry/irritated eyes.).     psyllium (METAMUCIL SMOOTH TEXTURE) 28 % packet Take 1 packet by mouth daily as needed (constipation).     risedronate  (ACTONEL ) 35 MG tablet TAKE 1 TABLET BY MOUTH ONCE A WEEK WITH WATER  ON EMPTY STOMACH (DO NOT EAT, DRINK, OR LIE DOWN FOR NEXT 30 MINUTES) 12 tablet 3   sodium chloride  (OCEAN) 0.65 % SOLN nasal spray Place 1 spray into both nostrils as needed (dry nose and mouth).     sucralfate  (CARAFATE ) 1 g tablet Take 1 tablet (1 g total) by mouth 3 (three) times daily as needed. 90 tablet 1   tiZANidine  (ZANAFLEX ) 2 MG tablet Take 1 tablet (2 mg total) by mouth at bedtime as needed for muscle spasms. 30 tablet 3   zolpidem  (AMBIEN ) 10 MG tablet Take 1 tablet (10 mg total) by mouth at bedtime as needed for sleep. 30 tablet 0   acetaminophen  (TYLENOL  8 HOUR) 650 MG CR tablet Take 650 mg by mouth every 8 (eight) hours as needed.     amLODipine  (NORVASC ) 5 MG tablet Take 1 tablet (5 mg total) by mouth daily 90 tablet 0   amoxicillin -clavulanate (AUGMENTIN ) 875-125 MG tablet Take 1 tablet by mouth 2 (two) times daily. (Patient not taking: Reported on 01/29/2024) 20 tablet 0   benzonatate  (TESSALON ) 200 MG capsule Take 1 capsule (200 mg total) by mouth 3 (three) times daily as needed. (Patient not taking: Reported on 01/29/2024) 30 capsule 0   HYDROcodone  bit-homatropine (HYCODAN) 5-1.5 MG/5ML syrup Take 5 mLs by mouth every 4 (four)  hours as needed. (Patient not taking: Reported on 01/29/2024) 240 mL 0   No current facility-administered medications on file prior to visit.    Allergies:  Allergies  Allergen Reactions   Crestor  [Rosuvastatin ] Other (See Comments)    Myalgia on 5 mg per day   Erythromycin  Nausea And Vomiting    Oral  No hives rash or itching   Mobic  [Meloxicam ] Other (See Comments)    Moody and irritable     Vital Signs:  BP (!) 165/88   Pulse 80   Ht 4' 11 (1.499 m)   Wt 128 lb (58.1 kg)   SpO2 96%   BMI 25.85 kg/m   Neurological Exam: MENTAL STATUS  including orientation to time, place, person, recent and remote memory, attention span and concentration, language, and fund of knowledge is normal.  Speech is not dysarthric.  CRANIAL NERVES:   Pupils equal round and reactive to light.  Normal conjugate, extra-ocular eye movements in all directions of gaze.  No ptosis.  Face is symmetric.   MOTOR:  Motor strength is 5/5 in all extremities.  No atrophy, fasciculations or abnormal movements.  No pronator drift.  Tone is normal.    MSRs:  Reflexes are 2+/4 throughout, except 1+/4 in the legs.  SENSORY:  Vibration reduced in the feet.  COORDINATION/GAIT:  Normal finger-to- nose-finger.   Gait is mildly wide-based, unsteady with turns, unassisted.    Data: MRI lumbar spine 09/24/2020: 1. 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 2. Moderate right L1-2 and L2-3 neural foraminal stenosis with right lateral recess narrowing at both levels. 3. No central spinal canal stenosis.    Thank you for allowing me to participate in patient's care.  If I can answer any additional questions, I would be pleased to do so.    Sincerely,    Lahari Suttles K. Tobie, DO

## 2024-02-15 ENCOUNTER — Other Ambulatory Visit: Payer: Self-pay | Admitting: Internal Medicine

## 2024-02-15 ENCOUNTER — Encounter: Payer: Self-pay | Admitting: Internal Medicine

## 2024-02-15 ENCOUNTER — Ambulatory Visit (INDEPENDENT_AMBULATORY_CARE_PROVIDER_SITE_OTHER): Admitting: Internal Medicine

## 2024-02-15 VITALS — BP 120/60 | HR 71 | Temp 97.9°F | Ht 59.0 in | Wt 130.8 lb

## 2024-02-15 DIAGNOSIS — E785 Hyperlipidemia, unspecified: Secondary | ICD-10-CM

## 2024-02-15 DIAGNOSIS — G4700 Insomnia, unspecified: Secondary | ICD-10-CM

## 2024-02-15 DIAGNOSIS — G609 Hereditary and idiopathic neuropathy, unspecified: Secondary | ICD-10-CM | POA: Diagnosis not present

## 2024-02-15 DIAGNOSIS — Z0000 Encounter for general adult medical examination without abnormal findings: Secondary | ICD-10-CM | POA: Diagnosis not present

## 2024-02-15 DIAGNOSIS — M810 Age-related osteoporosis without current pathological fracture: Secondary | ICD-10-CM

## 2024-02-15 DIAGNOSIS — Z79899 Other long term (current) drug therapy: Secondary | ICD-10-CM | POA: Diagnosis not present

## 2024-02-15 DIAGNOSIS — H9190 Unspecified hearing loss, unspecified ear: Secondary | ICD-10-CM | POA: Diagnosis not present

## 2024-02-15 DIAGNOSIS — I10 Essential (primary) hypertension: Secondary | ICD-10-CM | POA: Diagnosis not present

## 2024-02-15 DIAGNOSIS — Z23 Encounter for immunization: Secondary | ICD-10-CM | POA: Diagnosis not present

## 2024-02-15 MED ORDER — DULOXETINE HCL 60 MG PO CPEP: 60.0000 mg | ORAL_CAPSULE | Freq: Two times a day (BID) | ORAL | 1 refills | Status: DC

## 2024-02-15 MED ORDER — COVID-19 MRNA VAC-TRIS(PFIZER) 30 MCG/0.3ML IM SUSY: 0.3000 mL | PREFILLED_SYRINGE | Freq: Once | INTRAMUSCULAR | 0 refills | Status: AC

## 2024-02-15 MED ORDER — ZOLPIDEM TARTRATE 10 MG PO TABS: 10.0000 mg | ORAL_TABLET | Freq: Every evening | ORAL | 2 refills | Status: DC | PRN

## 2024-02-15 MED ORDER — DULOXETINE HCL 30 MG PO CPEP: 30.0000 mg | ORAL_CAPSULE | Freq: Every day | ORAL | 1 refills | Status: AC
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
## 2024-02-15 NOTE — Patient Instructions (Signed)
 Good to see you today  Send in cymbalta  60 and 30  to take once a day   you can try decrease to 60 mg per day for neuropathy .    Can get covid booster if you wish since y ou have  risk .  Would need prescription if so .   Get appt for fasting labs so we can check cholesterol and any other needed labs. Plan fu in about 6 months unless needed earlier .

## 2024-02-16 ENCOUNTER — Other Ambulatory Visit (INDEPENDENT_AMBULATORY_CARE_PROVIDER_SITE_OTHER)

## 2024-02-16 DIAGNOSIS — E785 Hyperlipidemia, unspecified: Secondary | ICD-10-CM

## 2024-02-16 DIAGNOSIS — I10 Essential (primary) hypertension: Secondary | ICD-10-CM | POA: Diagnosis not present

## 2024-02-16 DIAGNOSIS — Z79899 Other long term (current) drug therapy: Secondary | ICD-10-CM | POA: Diagnosis not present

## 2024-02-16 LAB — LIPID PANEL
Cholesterol: 243 mg/dL — ABNORMAL HIGH (ref 0–200)
HDL: 67.6 mg/dL (ref >39.00)
LDL Cholesterol: 148 mg/dL — ABNORMAL HIGH (ref 0–99)
NonHDL: 175.84
Total CHOL/HDL Ratio: 4
Triglycerides: 140 mg/dL (ref 0.0–149.0)
VLDL: 28 mg/dL (ref 0.0–40.0)

## 2024-02-16 LAB — BASIC METABOLIC PANEL WITH GFR
BUN: 14 mg/dL (ref 6–23)
CO2: 30 meq/L (ref 19–32)
Calcium: 8.9 mg/dL (ref 8.4–10.5)
Chloride: 103 meq/L (ref 96–112)
Creatinine, Ser: 0.83 mg/dL (ref 0.40–1.20)
GFR: 65.13 mL/min (ref >60.00)
Glucose, Bld: 103 mg/dL — ABNORMAL HIGH (ref 70–99)
Potassium: 4.2 meq/L (ref 3.5–5.1)
Sodium: 138 meq/L (ref 135–145)

## 2024-02-19 ENCOUNTER — Ambulatory Visit: Payer: Self-pay | Admitting: Internal Medicine

## 2024-02-19 DIAGNOSIS — Z85828 Personal history of other malignant neoplasm of skin: Secondary | ICD-10-CM | POA: Diagnosis not present

## 2024-02-19 DIAGNOSIS — L820 Inflamed seborrheic keratosis: Secondary | ICD-10-CM | POA: Diagnosis not present

## 2024-02-19 DIAGNOSIS — L570 Actinic keratosis: Secondary | ICD-10-CM | POA: Diagnosis not present

## 2024-02-19 DIAGNOSIS — C44519 Basal cell carcinoma of skin of other part of trunk: Secondary | ICD-10-CM | POA: Diagnosis not present

## 2024-02-19 DIAGNOSIS — L814 Other melanin hyperpigmentation: Secondary | ICD-10-CM | POA: Diagnosis not present

## 2024-02-19 DIAGNOSIS — D2261 Melanocytic nevi of right upper limb, including shoulder: Secondary | ICD-10-CM | POA: Diagnosis not present

## 2024-02-19 DIAGNOSIS — L821 Other seborrheic keratosis: Secondary | ICD-10-CM | POA: Diagnosis not present

## 2024-02-19 DIAGNOSIS — D485 Neoplasm of uncertain behavior of skin: Secondary | ICD-10-CM | POA: Diagnosis not present

## 2024-02-19 NOTE — Progress Notes (Signed)
 Results stable  kidney function is normal range

## 2024-02-20 ENCOUNTER — Telehealth: Payer: Self-pay

## 2024-02-20 NOTE — Telephone Encounter (Signed)
 Copied from CRM [id number]. Topic: Clinical - Lab/Test Results >> Feb 20, 2024 11:23 AM Dedra B wrote: Reason for CRM: Pt returning call for [place] regarding lab results.

## 2024-02-20 NOTE — Telephone Encounter (Signed)
 Spoke to pt. Please lab result note.

## 2024-02-20 NOTE — Progress Notes (Signed)
 Went over pt's lab result. Pt verbalized understanding.

## 2024-02-21 ENCOUNTER — Telehealth: Payer: Self-pay

## 2024-02-21 MED ORDER — DULOXETINE HCL 60 MG PO CPEP: 60.0000 mg | ORAL_CAPSULE | Freq: Every day | ORAL | 1 refills | Status: AC

## 2024-02-21 NOTE — Addendum Note (Signed)
 Addended by: Natasa Stigall on: 02/21/2024 05:05 PM   Modules accepted: Orders

## 2024-02-21 NOTE — Telephone Encounter (Signed)
 Copied from CRM #[id number]. Topic: Clinical - Medication Question >> Feb 21, 2024  8:21 AM Anairis L wrote: Reason for CRM: Jereld from [facility] pharmacy 02/15/2024 New script.   DULoxetine  (CYMBALTA ) 30 MG capsule-Ready for pick up   DULoxetine  (CYMBALTA ) 60 MG capsule-Processing  Please call to clarify orders. [phone]  Pharmacist both scripts will be too much for patient.

## 2024-02-21 NOTE — Telephone Encounter (Signed)
 Contact pt to confirm. Pt reports she is taking 60mg  once daily with 30mg .   Report to pt's pharmacy, [facility]. She advise to get new Rx sent or can take verbally. Inform her we can sent over new Rx.   Rx sent to pt's pharmacy.

## 2024-02-21 NOTE — Telephone Encounter (Signed)
 She is taking 60 mg and 30 mg every day ( one of each)

## 2024-02-28 ENCOUNTER — Ambulatory Visit (INDEPENDENT_AMBULATORY_CARE_PROVIDER_SITE_OTHER)

## 2024-02-28 ENCOUNTER — Other Ambulatory Visit: Payer: Self-pay | Admitting: Family

## 2024-02-28 VITALS — BP 139/89 | HR 97 | Temp 97.8°F | Ht 59.5 in | Wt 128.2 lb

## 2024-02-28 DIAGNOSIS — J479 Bronchiectasis, uncomplicated: Secondary | ICD-10-CM

## 2024-02-28 DIAGNOSIS — C44519 Basal cell carcinoma of skin of other part of trunk: Secondary | ICD-10-CM | POA: Diagnosis not present

## 2024-02-28 MED ORDER — ALBUTEROL SULFATE (2.5 MG/3ML) 0.083% IN NEBU: 2.5000 mg | INHALATION_SOLUTION | Freq: Two times a day (BID) | RESPIRATORY_TRACT | 12 refills | Status: AC
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
## 2024-02-28 NOTE — Assessment & Plan Note (Signed)
 Worsening bronchiectasis.  Likely because of recurrent pneumonias. Will rule out NTM. Will get sputum cultures. CT scan to evaluate for extent of bronchiectasis. PFTs. Advised using flutter valve and albuterol  nebs twice a day for clearance. Continue with OTC guaifenesin for expectoration. May consider brensocatib in future Orders:   Respiratory or Resp and Sputum Culture; Future   Pulmonary function test; Future   Flutter valve; Future   For home use only DME Nebulizer machine   CT Chest Wo Contrast; Future   AFB culture with smear; Future   AFB Culture & Smear

## 2024-02-28 NOTE — Patient Instructions (Signed)
 Notification of test results are managed in the following manner: If there are any recommendations or changes to the plan of care discussed in office today, we will contact you and let you know what they are. If you do not hear from us , then your results are normal/expected and you can view them through your MyChart account, or a letter will be sent to you. Thank you again for trusting us  with your care [facility].

## 2024-03-04 ENCOUNTER — Ambulatory Visit: Admission: RE | Admit: 2024-03-04 | Discharge: 2024-03-04 | Disposition: A | Source: Ambulatory Visit

## 2024-03-04 DIAGNOSIS — I2510 Atherosclerotic heart disease of native coronary artery without angina pectoris: Secondary | ICD-10-CM | POA: Diagnosis not present

## 2024-03-04 DIAGNOSIS — J479 Bronchiectasis, uncomplicated: Secondary | ICD-10-CM | POA: Diagnosis not present

## 2024-03-06 DIAGNOSIS — L570 Actinic keratosis: Secondary | ICD-10-CM | POA: Diagnosis not present

## 2024-03-06 DIAGNOSIS — Z85828 Personal history of other malignant neoplasm of skin: Secondary | ICD-10-CM | POA: Diagnosis not present

## 2024-03-06 DIAGNOSIS — L820 Inflamed seborrheic keratosis: Secondary | ICD-10-CM | POA: Diagnosis not present

## 2024-03-07 ENCOUNTER — Other Ambulatory Visit: Payer: Self-pay

## 2024-03-07 ENCOUNTER — Telehealth: Payer: Self-pay

## 2024-03-07 MED ORDER — LOSARTAN POTASSIUM 100 MG PO TABS: 100.0000 mg | ORAL_TABLET | Freq: Every day | ORAL | 0 refills | Status: DC

## 2024-03-07 NOTE — Telephone Encounter (Signed)
 Copied from CRM #[id number]. Topic: Clinical - Medication Refill >> Mar 04, 2024 11:09 AM Alfonso HERO wrote: Medication: losartan  (COZAAR ) 100 MG tablet  Has the patient contacted their pharmacy? Yes (Agent: If no, request that the patient contact the pharmacy for the refill. If patient does not wish to contact the pharmacy document the reason why and proceed with request.) (Agent: If yes, when and what did the pharmacy advise?)  This is the patient's preferred pharmacy:  [facility] # [address] [place] - [address] [place] [address] Phone: [phone] Fax: [phone]  [id number] - [place], [place] - [address] AT [facility] [address] [facility] [address] [place] [address] Phone: [phone] Fax: [phone]  [id number] - [place], [place] - [address] AT OF [address] [place] [address] Phone: [phone] Fax: [phone]  Is this the correct pharmacy for this prescription? Yes If no, delete pharmacy and type the correct one.   Has the prescription been filled recently? No  Is the patient out of the medication? Yes  Has the patient been seen for an appointment in the last year OR does the patient have an upcoming appointment? Yes  Can we respond through MyChart? Yes  Agent: Please be advised that Rx refills may take up to 3 business days. We ask that you follow-up with your pharmacy.  [id number] - [place], [place] - [address] [place] [address] Phone: [phone] Fax: [phone] >> Mar 05, 2024  4:46 PM Winona R wrote: Pt calling to check the status of her med refill. I have advised her it may take up to 3 buisness days. I have also confirmed the pt would like this to go to  [facility] # [address], [place] - [address]    [address]    [place]  [address]    Phone: [phone] Fax: [phone]

## 2024-03-07 NOTE — Telephone Encounter (Signed)
Rx was sent to [facility].

## 2024-03-08 ENCOUNTER — Ambulatory Visit: Payer: Self-pay

## 2024-03-12 NOTE — Telephone Encounter (Signed)
 Copied from CRM [id number]. Topic: Clinical - Lab/Test Results >> Mar 08, 2024 10:44 AM Devaughn RAMAN wrote: Reason for CRM: Patient called in regarding CT results.  Called and spoke with the pt and relayed CT results. Pt is aware and would like to talk about this more in detail at 12/1 office visit.

## 2024-04-02 NOTE — Progress Notes (Signed)
   04/02/2024  Patient ID: Julie Bonilla, female   DOB: 1941-05-12, 83 y.o.   MRN: [id number]  Pharmacy Quality Measure Review  This patient is appearing on a report for being at risk of failing the adherence measure for hypertension (ACEi/ARB) medications this calendar year.   Medication: Losartan  Last fill date: 03/07/24 for 90 day supply  Fill dates are not showing in DrFirst. Contacted pharmacy to verify, they confirm 90ds was filled 03/07/24 and picked up 03/09/24.  Jon VEAR Lindau, PharmD Clinical Pharmacist [phone]

## 2024-04-10 ENCOUNTER — Telehealth: Payer: Self-pay | Admitting: *Deleted

## 2024-04-10 DIAGNOSIS — J479 Bronchiectasis, uncomplicated: Secondary | ICD-10-CM

## 2024-04-10 NOTE — Telephone Encounter (Unsigned)
 Copied from CRM #[id number]. Topic: Clinical - Medication Question >> Apr 10, 2024  8:57 AM Devaughn RAMAN wrote: Reason for CRM: Pt has congestion and would like to know if Dr.Pawar would write her a rx for a penicillin. Pt stated she is on her 3rd round on the nebulizer, it has helped but she is still coughing and still congested. Please f/u with pt regarding this

## 2024-04-11 MED ORDER — AMOXICILLIN-POT CLAVULANATE 875-125 MG PO TABS: 1.0000 | ORAL_TABLET | Freq: Two times a day (BID) | ORAL | 0 refills | Status: AC

## 2024-04-11 NOTE — Telephone Encounter (Signed)
 Being followed by Dr. Theodoro  I am not sure what antibiotic she got previously as there is no record of it from the pulmonary visit. CT scan on 9/29 which I reviewed shows bronchiectasis and increased consolidation which may be due to ongoing aspiration.  I have called in a prescription for Augmentin  which is a form of penicillin to her pharmacy.

## 2024-04-11 NOTE — Telephone Encounter (Signed)
 I called and spoke with the pt  She is c/o continued cough and chest tightness, not improved at all since visit 02/28/24  She states that she is not able to cough up any sputum at all  She denies any fevers, aches, increased dyspnea She is taking mucinex once daily and using her albuterol  neb bid  She thinks may need more abx and wonders if [facility] containing abx would help her  She was supposed to be given flutter device last visit but unfortunately this was not given  I offered to leave her one for pick up today but she was unable- I have sent urgent order to DME  I asked that DME please deliver flutter to pt's home   Pawar unavailable so forwarding to DOD- Dr. Theophilus, please advise, thanks!  Allergies  Allergen Reactions   Crestor  [Rosuvastatin ] Other (See Comments)    Myalgia on 5 mg per day   Erythromycin  Nausea And Vomiting    Oral  No hives rash or itching   Mobic  [Meloxicam ] Other (See Comments)    Moody and irritable

## 2024-04-12 NOTE — Telephone Encounter (Signed)
 I called and spoke with pt and notified of response from Dr. Theophilus. She verbalized understanding. Nothing further needed.

## 2024-04-15 ENCOUNTER — Other Ambulatory Visit: Payer: Self-pay | Admitting: Internal Medicine

## 2024-04-15 NOTE — Telephone Encounter (Signed)
 Copied from CRM [id number]. Topic: Clinical - Medication Refill >> Apr 15, 2024 12:58 PM Julie Bonilla wrote: Medication: amLODipine  (NORVASC ) 5 MG tablet  Has the patient contacted their pharmacy? No (Agent: If no, request that the patient contact the pharmacy for the refill. If patient does not wish to contact the pharmacy document the reason why and proceed with request.) (Agent: If yes, when and what did the pharmacy advise?)  This is the patient's preferred pharmacy:    [id number] - [place], [place] - [address] AT [facility] [address] [facility] [address] [name] [address] Phone: [phone] Fax: [phone]  Is this the correct pharmacy for this prescription? Yes If no, delete pharmacy and type the correct one.   Has the prescription been filled recently?no  Is the patient out of the medication? Yes  Has the patient been seen for an appointment in the last year OR does the patient have an upcoming appointment? Yes  Can we respond through MyChart? No  Agent: Please be advised that Rx refills may take up to 3 business days. We ask that you follow-up with your pharmacy.

## 2024-04-16 MED ORDER — AMLODIPINE BESYLATE 5 MG PO TABS: 5.0000 mg | ORAL_TABLET | Freq: Every day | ORAL | 0 refills | Status: DC

## 2024-04-17 ENCOUNTER — Other Ambulatory Visit: Payer: Self-pay | Admitting: Internal Medicine

## 2024-04-22 NOTE — Telephone Encounter (Signed)
 Contacted pharmacy and spoke to Haysi. She reports pt had pick it up.

## 2024-04-26 ENCOUNTER — Other Ambulatory Visit: Payer: Self-pay | Admitting: Internal Medicine

## 2024-05-06 ENCOUNTER — Ambulatory Visit

## 2024-05-06 VITALS — BP 138/84 | HR 72 | Ht 59.5 in | Wt 131.0 lb

## 2024-05-06 DIAGNOSIS — J479 Bronchiectasis, uncomplicated: Secondary | ICD-10-CM | POA: Diagnosis not present

## 2024-05-06 LAB — PULMONARY FUNCTION TEST
DL/VA % pred: 75 %
DL/VA: 3.17 ml/min/mmHg/L
DLCO unc % pred: 73 %
DLCO unc: 11.97 ml/min/mmHg
FEF 25-75 Pre: 1.21 L/s
FEF2575-%Pred-Pre: 115 %
FEV1-%Pred-Pre: 108 %
FEV1-Pre: 1.62 L
FEV1FVC-%Pred-Pre: 101 %
FEV6-%Pred-Pre: 113 %
FEV6-Pre: 2.15 L
FEV6FVC-%Pred-Pre: 106 %
FVC-%Pred-Pre: 106 %
FVC-Pre: 2.17 L
Pre FEV1/FVC ratio: 75 %
Pre FEV6/FVC Ratio: 99 %
RV % pred: 113 %
RV: 2.56 L
TLC % pred: 109 %
TLC: 4.92 L

## 2024-05-06 MED ORDER — GUAIFENESIN 100 MG/5ML PO LIQD: 5.0000 mL | ORAL | 0 refills | Status: AC | PRN

## 2024-05-06 NOTE — Patient Instructions (Signed)
  VISIT SUMMARY: During your visit, we discussed your bronchiectasis and gastroesophageal reflux disease (GERD). Your lung function test was normal, and your bronchiectasis appears stable on the CT scan, although there is increased scarring in the left lower lobe. You have a persistent dry cough, which you manage with albuterol  and a nebulizer. Your GERD is well-controlled with pantoprazole .  YOUR PLAN: -BRONCHIECTASIS WITH LEFT LOWER LOBE SCARRING: Bronchiectasis is a condition where the airways in your lungs become damaged, leading to mucus build-up and infections. Your condition is stable, but there is increased scarring in the left lower lobe. Continue using the albuterol  nebulizer, especially in the morning, and obtain a flutter valve to help clear secretions. Monitor for any weight loss or fever, and we may consider a bronchoscopy if symptoms arise.  -GASTROESOPHAGEAL REFLUX DISEASE (GERD): GERD is a condition where stomach acid frequently flows back into the tube connecting your mouth and stomach, causing irritation. Your GERD is well-controlled with pantoprazole . Continue taking pantoprazole  as prescribed.  INSTRUCTIONS: Please continue using the albuterol  nebulizer, especially in the morning, and obtain a flutter valve to help with secretion clearance. Monitor for any weight loss or fever, and contact us  if these symptoms arise. Continue taking pantoprazole  for GERD as prescribed.                      Contains text generated by Abridge.                                 Contains text generated by Abridge.
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
## 2024-05-06 NOTE — Patient Instructions (Signed)
 Full pft w/o post performed today

## 2024-05-06 NOTE — Progress Notes (Signed)
 Full pft w/o post performed today

## 2024-05-06 NOTE — Assessment & Plan Note (Addendum)
  Orders:   guaiFENesin (ROBITUSSIN) 100 MG/5ML liquid; Take 5 mLs by mouth every 4 (four) hours as needed for cough or to loosen phlegm.

## 2024-05-20 ENCOUNTER — Other Ambulatory Visit: Payer: Self-pay

## 2024-05-20 DIAGNOSIS — R051 Acute cough: Secondary | ICD-10-CM

## 2024-05-20 NOTE — Telephone Encounter (Unsigned)
 Copied from CRM [id number]. Topic: Clinical - Medication Refill >> May 20, 2024 12:59 PM Rilla B wrote: Medication: benzonatate  (TESSALON ) 200 MG capsule  Has the patient contacted their pharmacy? Yes (Agent: If no, request that the patient contact the pharmacy for the refill. If patient does not wish to contact the pharmacy document the reason why and proceed with request.) (Agent: If yes, when and what did the pharmacy advise?)  This is the patient's preferred pharmacy:   [id number] - [place], [place] - [address] AT [facility] [address] [facility] [address] [place] [address] Phone: [phone] Fax: [phone]  Is this the correct pharmacy for this prescription? Yes If no, delete pharmacy and type the correct one.   Has the prescription been filled recently? Yes  Is the patient out of the medication? Yes  Has the patient been seen for an appointment in the last year OR does the patient have an upcoming appointment? Yes  Can we respond through MyChart? No  Agent: Please be advised that Rx refills may take up to 3 business days. We ask that you follow-up with your pharmacy.

## 2024-05-21 NOTE — Telephone Encounter (Signed)
 Copied from CRM #[id number]. Topic: Clinical - Prescription Issue >> May 20, 2024  1:02 PM Rilla B wrote: Reason for CRM: Patient calling to state Dr Theodoro was suppose to call her in a cough syrup. Checked with pharmacy, not there.  Cough syrup not in her list of medication.  Please call patient [phone].   Please advise Dr. Pawar

## 2024-05-24 ENCOUNTER — Telehealth: Payer: Self-pay | Admitting: *Deleted

## 2024-05-24 NOTE — Telephone Encounter (Signed)
 Per pharmacist this is otc patient notified. NFN  Copied from CRM #[id number]. Topic: Clinical - Prescription Issue >> May 24, 2024 12:39 PM Rozanna MATSU wrote: Pt stated she has not rec'd the robitussin cough syrup that Dr. Theodoro send on 12/1. She would like this sent to [facility] [address] [place], [place], [place] [phone]

## 2024-05-27 ENCOUNTER — Ambulatory Visit: Admitting: Neurology

## 2024-05-28 ENCOUNTER — Ambulatory Visit (INDEPENDENT_AMBULATORY_CARE_PROVIDER_SITE_OTHER): Admitting: Family Medicine

## 2024-05-28 DIAGNOSIS — Z0000 Encounter for general adult medical examination without abnormal findings: Secondary | ICD-10-CM | POA: Diagnosis not present

## 2024-05-28 NOTE — Patient Instructions (Addendum)
 I really enjoyed getting to talk with you today! I am available on Tuesdays and Thursdays for virtual visits if you have any questions or concerns, or if I can be of any further assistance.   CHECKLIST FROM ANNUAL WELLNESS VISIT:  -Follow up (please call to schedule if not scheduled after visit):   -yearly for annual wellness visit with primary care office  Here is a list of your preventive care/health maintenance measures and the plan for each if any are due:  PLAN For any measures below that may be due:    1. Please send us  immunization report from your pharmacy so that we can update your record.  Health Maintenance  Topic Date Due   COVID-19 Vaccine (9 - 2025-26 season) 02/05/2024   Medicare Annual Wellness (AWV)  05/17/2024   DTaP/Tdap/Td (5 - Td or Tdap) 08/07/2028   Pneumococcal Vaccine: 50+ Years  Completed   Influenza Vaccine  Completed   Bone Density Scan  Completed   Zoster Vaccines- Shingrix  Completed   Meningococcal B Vaccine  Aged Out   Colonoscopy  Discontinued    -See a dentist at least yearly  -Get your eyes checked and then per your eye specialist's recommendations  -Other issues addressed today:   -I have included below further information regarding a healthy whole foods based diet, physical activity guidelines for adults, stress management and opportunities for social connections. I hope you find this information useful.   -----------------------------------------------------------------------------------------------------------------------------------------------------------------------------------------------------------------------------------------------------------    NUTRITION: -eat real food: lots of colorful vegetables (half the plate) and fruits -5-7 servings of vegetables and fruits per day (fresh or steamed is best), exp. 2 servings of vegetables with lunch and dinner and 2 servings of fruit per day. Berries and greens such as kale and collards  are great choices.  -consume on a regular basis:  fresh fruits, fresh veggies, fish, nuts, seeds, healthy oils (such as olive oil, avocado oil), whole grains (make sure for bread/pasta/crackers/etc., that the first ingredient on label contains the word whole), legumes. -can eat small amounts of dairy and lean meat (no larger than the palm of your hand), but avoid processed meats such as ham, bacon, lunch meat, etc. -drink water  -try to avoid fast food and pre-packaged foods, processed meat, ultra processed foods/beverages (donuts, candy, etc.) -most experts advise limiting sodium to < 2300mg  per day, should limit further is any chronic conditions such as high blood pressure, heart disease, diabetes, etc. The American Heart Association advised that < 1500mg  is is ideal -try to avoid foods/beverages that contain any ingredients with names you do not recognize  -try to avoid foods/beverages  with added sugar or sweeteners/sweets  -try to avoid sweet drinks (including diet drinks): soda, juice, Gatorade, sweet tea, power drinks, diet drinks -try to avoid white rice, white bread, pasta (unless whole grain)  EXERCISE GUIDELINES FOR ADULTS: -if you wish to increase your physical activity, do so gradually and with the approval of your doctor -STOP and seek medical care immediately if you have any chest pain, chest discomfort or trouble breathing when starting or increasing exercise  -move and stretch your body, legs, feet and arms when sitting for long periods -Physical activity guidelines for optimal health in adults: -get at least 150 minutes per week of moderate exercise (can talk, but not sing); this is about 20-30 minutes of sustained activity 5-7 days per week or two 10-15 minute episodes of sustained activity 5-7 days per week -do some muscle building/resistance training/strength training at least 2  days per week  -balance exercises 3+ days per week:   Stand somewhere where you have something  sturdy to hold onto if you lose balance    1) lift up on toes, then back down, start with 5x per day and work up to 20x   2) stand and lift one leg straight out to the side so that foot is a few inches of the floor, start with 5x each side and work up to 20x each side   3) stand on one foot, start with 5 seconds each side and work up to 20 seconds on each side  If you need ideas or help with getting more active:  -Silver sneakers https://tools.silversneakers.com  -Walk with a Doc: Http://www.duncan-williams.com/  -try to include resistance (weight lifting/strength building) and balance exercises twice per week: or the following link for ideas: http://castillo-powell.com/  buyducts.dk  STRESS MANAGEMENT: -can try meditating, or just sitting quietly with deep breathing while intentionally relaxing all parts of your body for 5 minutes daily -if you need further help with stress, anxiety or depression please follow up with your primary doctor or contact the wonderful folks at [facility]: [phone]  SOCIAL CONNECTIONS: -options in [place] if you wish to engage in more social and exercise related activities:  -Silver sneakers https://tools.silversneakers.com  -Walk with a Doc: Http://www.duncan-williams.com/  -Check out the [facility] Active Adults 50+ section on the [place] of [employer] companies (hiking clubs, book clubs, cards and games, chess, exercise classes, aquatic classes and much more) - see the website for details: https://www.[place].gov/departments/parks-recreation/active-adults50  -YouTube has lots of exercise videos for different ages and abilities as well  -[facility] (a variety of indoor and outdoor inperson activities for adults). [phone]. [address]  -Virtual Online Classes (a variety of topics): see seniorplanet.org or call  [phone]  -consider volunteering at a school, hospice center, church, senior center or elsewhere
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
## 2024-06-02 ENCOUNTER — Other Ambulatory Visit: Payer: Self-pay | Admitting: Internal Medicine

## 2024-06-04 ENCOUNTER — Telehealth: Payer: Self-pay

## 2024-06-04 ENCOUNTER — Other Ambulatory Visit: Payer: Self-pay | Admitting: Internal Medicine

## 2024-06-04 NOTE — Telephone Encounter (Signed)
-----   Message from Jon VEAR Lindau sent at 05/29/2024  8:34 AM EST ----- Checked patient's insurance formulary.  Alendronate 70mg  once weekly would be covered and be cheapest with her insurance.  Ibandronate 150mg  once monthly is another alternative but on the next tier up so would be a little more cost with it.  Let me know if anything else is needed!  Thanks, Jon VEAR Lindau, PharmD Clinical Pharmacist [phone] ----- Message ----- From: Brien Rea LABOR, CMA Sent: 05/29/2024   8:29 AM EST To: Jon VEAR Lindau, RPH; [facility]   ----- Message ----- From: Luke Chiquita SAUNDERS, DO Sent: 05/28/2024   6:21 PM EST To: Rea LABOR Brien, CMA  Hi Leah,  Could you get our pharmacy team to look into this patient's Actonel  rx. She says it is no longer covered. Could they check and see what is covered and work with her PCP to get this straightened out? Thanks!

## 2024-06-12 ENCOUNTER — Other Ambulatory Visit: Payer: Self-pay | Admitting: Internal Medicine

## 2024-06-20 ENCOUNTER — Ambulatory Visit: Admitting: Internal Medicine

## 2024-06-25 ENCOUNTER — Telehealth: Payer: Self-pay

## 2024-06-25 NOTE — Telephone Encounter (Signed)
 Copied from CRM #[id number]. Topic: Clinical - Medication Refill >> Jun 25, 2024  9:03 AM Eva FALCON wrote: Medication:  zolpidem  (AMBIEN ) 10 MG tablet   Has the patient contacted their pharmacy? No, wants to change pharmacy. (Agent: If no, request that the patient contact the pharmacy for the refill. If patient does not wish to contact the pharmacy document the reason why and proceed with request.) (Agent: If yes, when and what did the pharmacy advise?)  This is the patient's preferred pharmacy:  [id number] - [place], [place] - [address] AT [facility] OF [address] [place] [address] Phone: [phone] Fax: [phone]  Is this the correct pharmacy for this prescription? Yes If no, delete pharmacy and type the correct one.   Has the prescription been filled recently? Yes  Is the patient out of the medication? No  Has the patient been seen for an appointment in the last year OR does the patient have an upcoming appointment? Yes  Can we respond through MyChart? No, prefer phone call.   Agent: Please be advised that Rx refills may take up to 3 business days. We ask that you follow-up with your pharmacy.

## 2024-06-26 ENCOUNTER — Ambulatory Visit (INDEPENDENT_AMBULATORY_CARE_PROVIDER_SITE_OTHER): Admitting: Internal Medicine

## 2024-06-26 ENCOUNTER — Encounter: Payer: Self-pay | Admitting: Internal Medicine

## 2024-06-26 VITALS — BP 154/70 | HR 85 | Temp 97.6°F | Ht 59.5 in | Wt 129.6 lb

## 2024-06-26 DIAGNOSIS — I10 Essential (primary) hypertension: Secondary | ICD-10-CM

## 2024-06-26 DIAGNOSIS — Z79899 Other long term (current) drug therapy: Secondary | ICD-10-CM

## 2024-06-26 DIAGNOSIS — M810 Age-related osteoporosis without current pathological fracture: Secondary | ICD-10-CM

## 2024-06-26 DIAGNOSIS — G4700 Insomnia, unspecified: Secondary | ICD-10-CM | POA: Diagnosis not present

## 2024-06-26 DIAGNOSIS — R4189 Other symptoms and signs involving cognitive functions and awareness: Secondary | ICD-10-CM

## 2024-06-26 MED ORDER — IBANDRONATE SODIUM 150 MG PO TABS: 150.0000 mg | ORAL_TABLET | ORAL | 3 refills | Status: AC

## 2024-06-26 MED ORDER — EZETIMIBE 10 MG PO TABS: 10.0000 mg | ORAL_TABLET | Freq: Every day | ORAL | 0 refills | Status: AC

## 2024-06-26 MED ORDER — ZOLPIDEM TARTRATE 10 MG PO TABS: 10.0000 mg | ORAL_TABLET | Freq: Every evening | ORAL | 0 refills | Status: AC | PRN

## 2024-06-26 NOTE — Patient Instructions (Addendum)
 Ambien  is a risk factor for  memory decline   but  I a aware that it is very helpful for your sleep .  BP goal should be 130 range and below .  We can try  a different osteoporosis med  that is monthly .  Will send information to your neurologist .  And have have her .  Address the  memory concerns   . But screening   test is  ok .   Change  march appt to April .
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
## 2024-06-28 NOTE — Telephone Encounter (Signed)
 Had ov and changed to boniva  monthly as a trial

## 2024-08-20 ENCOUNTER — Ambulatory Visit: Admitting: Internal Medicine

## 2024-08-27 ENCOUNTER — Ambulatory Visit: Admitting: Neurology

## 2024-09-24 ENCOUNTER — Ambulatory Visit: Admitting: Internal Medicine
